# Patient Record
Sex: Female | Born: 1950 | Race: White | Hispanic: No | Smoking: Former smoker
Health system: Southern US, Community
[De-identification: ages and names within clinical notes are randomized; demographics above are authoritative.]

## PROBLEM LIST (undated history)

## (undated) DIAGNOSIS — F32A Depression, unspecified: Secondary | ICD-10-CM

## (undated) DIAGNOSIS — J40 Bronchitis, not specified as acute or chronic: Secondary | ICD-10-CM

## (undated) DIAGNOSIS — I499 Cardiac arrhythmia, unspecified: Secondary | ICD-10-CM

## (undated) DIAGNOSIS — R12 Heartburn: Secondary | ICD-10-CM

## (undated) DIAGNOSIS — T7840XA Allergy, unspecified, initial encounter: Secondary | ICD-10-CM

## (undated) DIAGNOSIS — F419 Anxiety disorder, unspecified: Secondary | ICD-10-CM

## (undated) DIAGNOSIS — F329 Major depressive disorder, single episode, unspecified: Secondary | ICD-10-CM

## (undated) DIAGNOSIS — I10 Essential (primary) hypertension: Secondary | ICD-10-CM

## (undated) DIAGNOSIS — B009 Herpesviral infection, unspecified: Secondary | ICD-10-CM

## (undated) DIAGNOSIS — R569 Unspecified convulsions: Secondary | ICD-10-CM

## (undated) DIAGNOSIS — K219 Gastro-esophageal reflux disease without esophagitis: Secondary | ICD-10-CM

## (undated) DIAGNOSIS — E041 Nontoxic single thyroid nodule: Secondary | ICD-10-CM

## (undated) DIAGNOSIS — Z8489 Family history of other specified conditions: Secondary | ICD-10-CM

## (undated) DIAGNOSIS — N390 Urinary tract infection, site not specified: Secondary | ICD-10-CM

## (undated) HISTORY — DX: Depression, unspecified: F32.A

## (undated) HISTORY — DX: Cardiac arrhythmia, unspecified: I49.9

## (undated) HISTORY — DX: Heartburn: R12

## (undated) HISTORY — PX: KNEE ARTHROSCOPY: SUR90

## (undated) HISTORY — DX: Major depressive disorder, single episode, unspecified: F32.9

## (undated) HISTORY — DX: Gastro-esophageal reflux disease without esophagitis: K21.9

## (undated) HISTORY — PX: ELBOW SURGERY: SHX618

## (undated) HISTORY — DX: Nontoxic single thyroid nodule: E04.1

## (undated) HISTORY — DX: Anxiety disorder, unspecified: F41.9

## (undated) HISTORY — DX: Unspecified convulsions: R56.9

## (undated) HISTORY — PX: COLONOSCOPY: SHX174

## (undated) HISTORY — PX: PILONIDAL CYST EXCISION: SHX744

## (undated) HISTORY — PX: OTHER SURGICAL HISTORY: SHX169

## (undated) HISTORY — DX: Allergy, unspecified, initial encounter: T78.40XA

---

## 1998-05-06 ENCOUNTER — Other Ambulatory Visit: Admission: RE | Admit: 1998-05-06 | Discharge: 1998-05-06 | Payer: Self-pay | Admitting: Obstetrics and Gynecology

## 1999-09-01 ENCOUNTER — Other Ambulatory Visit: Admission: RE | Admit: 1999-09-01 | Discharge: 1999-09-01 | Payer: Self-pay | Admitting: Obstetrics and Gynecology

## 2000-09-02 ENCOUNTER — Other Ambulatory Visit: Admission: RE | Admit: 2000-09-02 | Discharge: 2000-09-02 | Payer: Self-pay | Admitting: *Deleted

## 2001-11-17 ENCOUNTER — Other Ambulatory Visit: Admission: RE | Admit: 2001-11-17 | Discharge: 2001-11-17 | Payer: Self-pay | Admitting: Family Medicine

## 2001-11-21 ENCOUNTER — Encounter: Admission: RE | Admit: 2001-11-21 | Discharge: 2001-11-21 | Payer: Self-pay | Admitting: Family Medicine

## 2001-11-21 ENCOUNTER — Encounter: Payer: Self-pay | Admitting: Family Medicine

## 2002-12-21 ENCOUNTER — Encounter: Admission: RE | Admit: 2002-12-21 | Discharge: 2002-12-21 | Payer: Self-pay | Admitting: Family Medicine

## 2002-12-21 ENCOUNTER — Encounter: Payer: Self-pay | Admitting: Family Medicine

## 2003-01-30 ENCOUNTER — Other Ambulatory Visit: Admission: RE | Admit: 2003-01-30 | Discharge: 2003-01-30 | Payer: Self-pay | Admitting: Family Medicine

## 2004-01-14 ENCOUNTER — Encounter: Admission: RE | Admit: 2004-01-14 | Discharge: 2004-01-14 | Payer: Self-pay | Admitting: Family Medicine

## 2004-02-06 ENCOUNTER — Other Ambulatory Visit: Admission: RE | Admit: 2004-02-06 | Discharge: 2004-02-06 | Payer: Self-pay | Admitting: Family Medicine

## 2004-02-11 ENCOUNTER — Encounter: Admission: RE | Admit: 2004-02-11 | Discharge: 2004-02-11 | Payer: Self-pay | Admitting: Family Medicine

## 2004-02-18 ENCOUNTER — Encounter (HOSPITAL_COMMUNITY): Admission: RE | Admit: 2004-02-18 | Discharge: 2004-05-18 | Payer: Self-pay | Admitting: Family Medicine

## 2004-05-28 ENCOUNTER — Ambulatory Visit: Payer: Self-pay | Admitting: Family Medicine

## 2004-06-01 ENCOUNTER — Ambulatory Visit: Payer: Self-pay | Admitting: Family Medicine

## 2005-01-14 ENCOUNTER — Encounter: Admission: RE | Admit: 2005-01-14 | Discharge: 2005-01-14 | Payer: Self-pay | Admitting: Family Medicine

## 2005-02-10 ENCOUNTER — Ambulatory Visit: Payer: Self-pay | Admitting: Family Medicine

## 2005-02-10 ENCOUNTER — Other Ambulatory Visit: Admission: RE | Admit: 2005-02-10 | Discharge: 2005-02-10 | Payer: Self-pay | Admitting: Family Medicine

## 2005-02-16 ENCOUNTER — Encounter (INDEPENDENT_AMBULATORY_CARE_PROVIDER_SITE_OTHER): Payer: Self-pay | Admitting: *Deleted

## 2005-02-16 ENCOUNTER — Encounter: Admission: RE | Admit: 2005-02-16 | Discharge: 2005-02-16 | Payer: Self-pay | Admitting: Family Medicine

## 2005-06-17 ENCOUNTER — Ambulatory Visit: Payer: Self-pay | Admitting: Family Medicine

## 2005-08-04 ENCOUNTER — Ambulatory Visit: Payer: Self-pay | Admitting: Internal Medicine

## 2005-08-17 ENCOUNTER — Ambulatory Visit: Payer: Self-pay | Admitting: Internal Medicine

## 2005-10-04 ENCOUNTER — Ambulatory Visit: Payer: Self-pay | Admitting: Family Medicine

## 2005-10-20 IMAGING — US US ABDOMEN COMPLETE
1 series · 14 of 25 positions shown · non-contrast
Comparison: None.

CLINICAL DATA: Right upper quadrant pain. 
 COMPLETE ABDOMINAL ULTRASOUND:

[Series 1: unknown · 0.23mm/px · 14 of 67 slices shown]
[im 1/67]
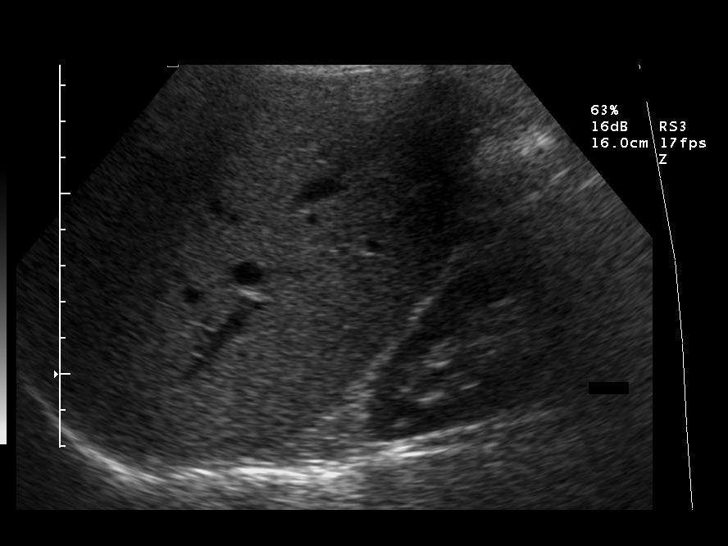
[im 6/67]
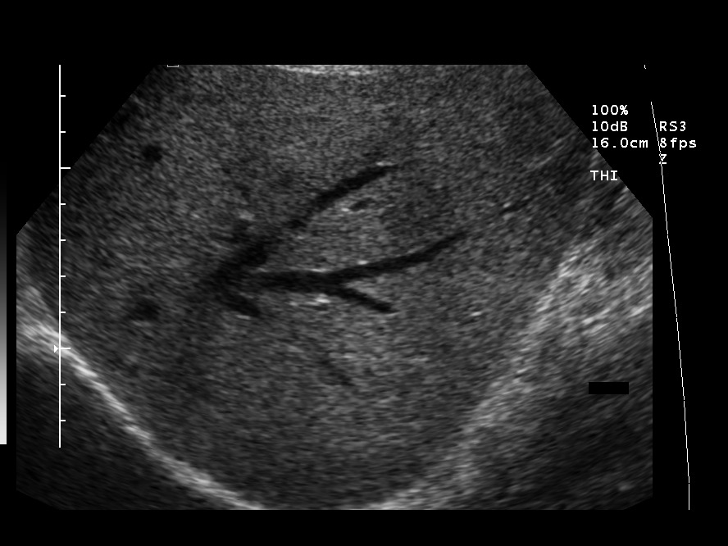
[im 12/67]
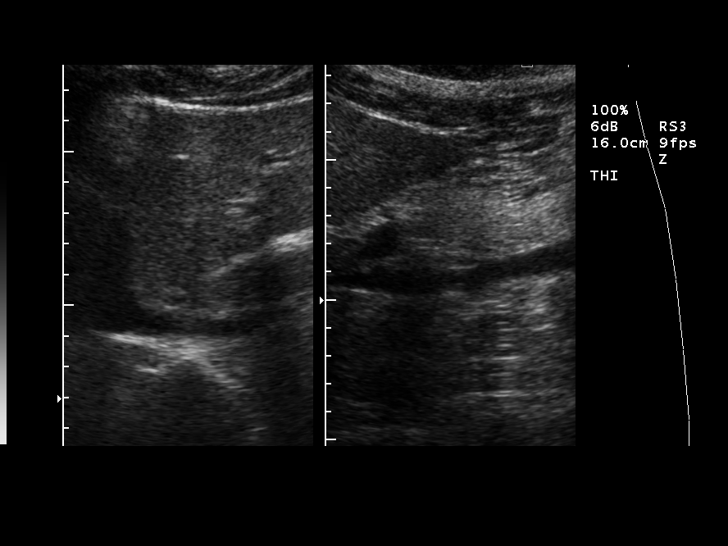
[im 17/67]
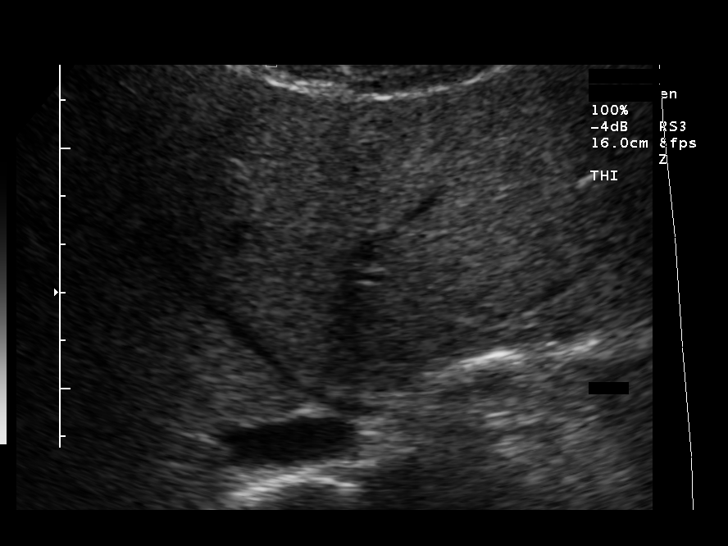
[im 23/67]
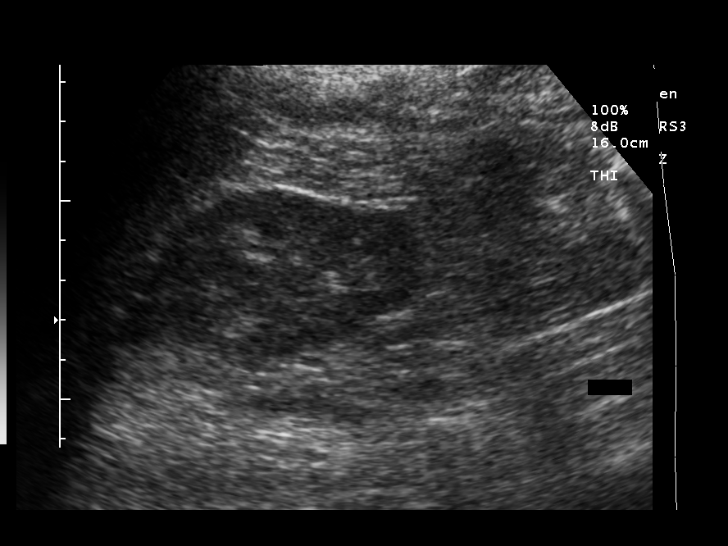
[im 25/67]
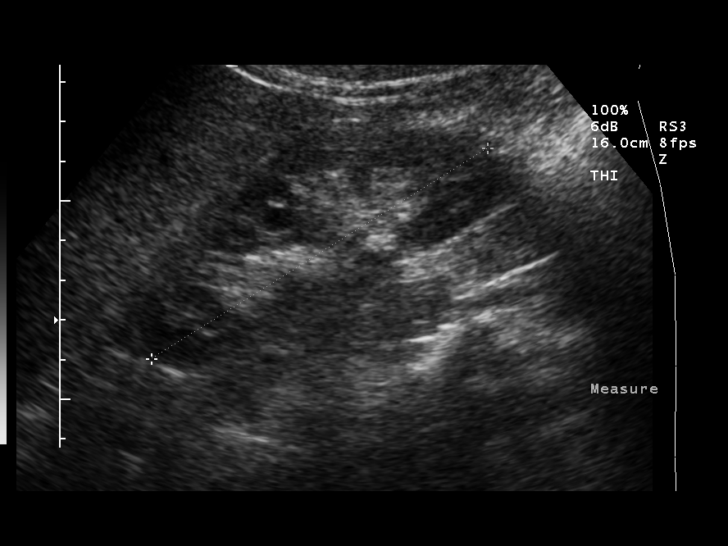
[im 31/67]
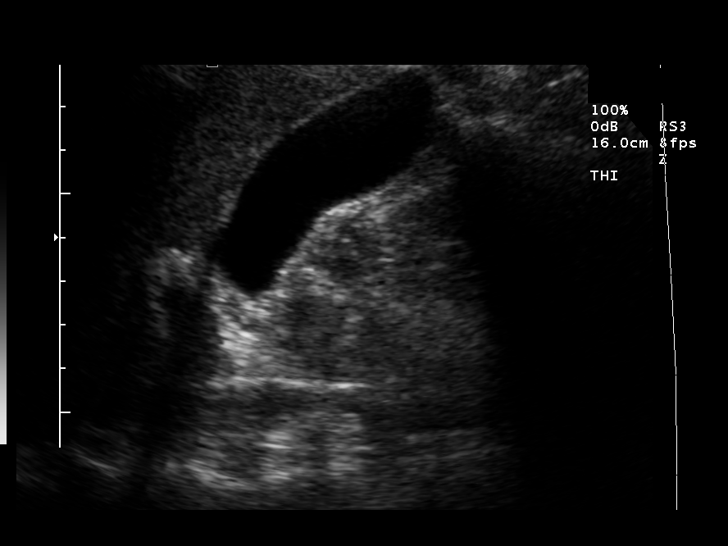
[im 36/67]
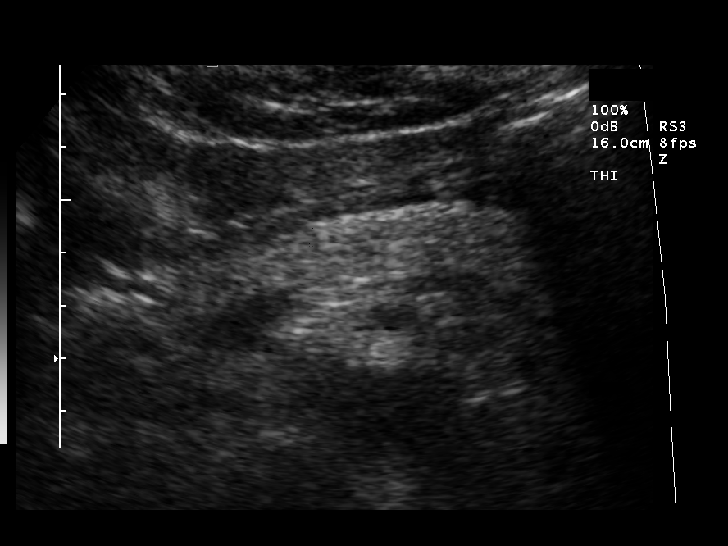
[im 42/67]
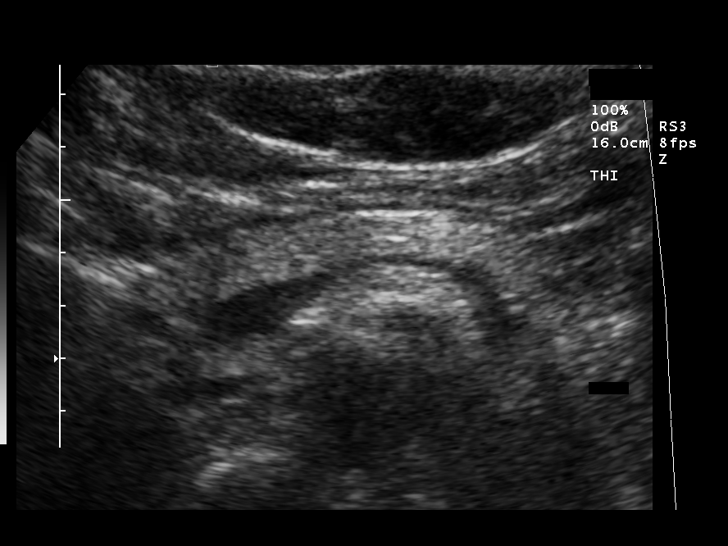
[im 45/67]
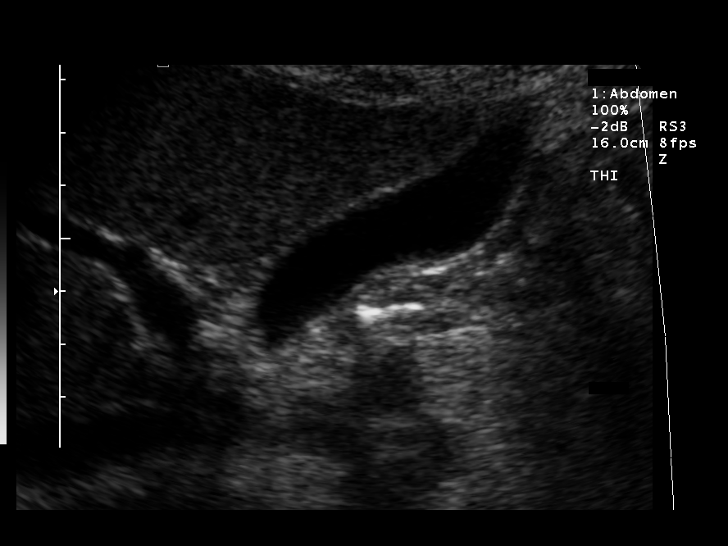
[im 50/67]
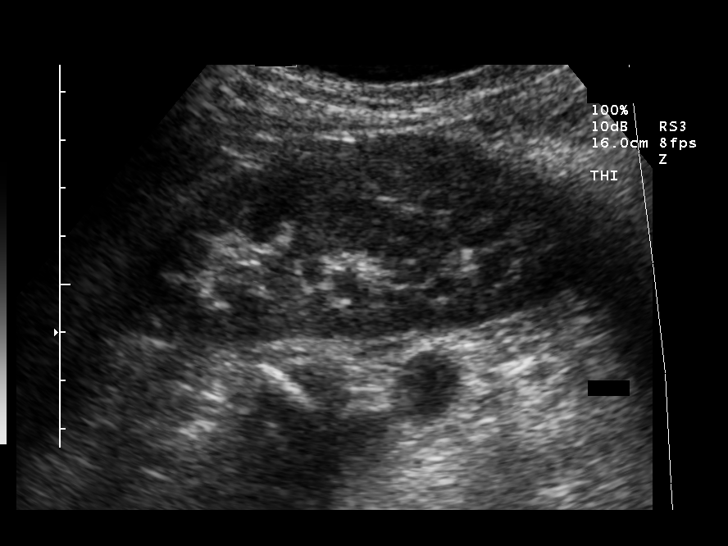
[im 56/67]
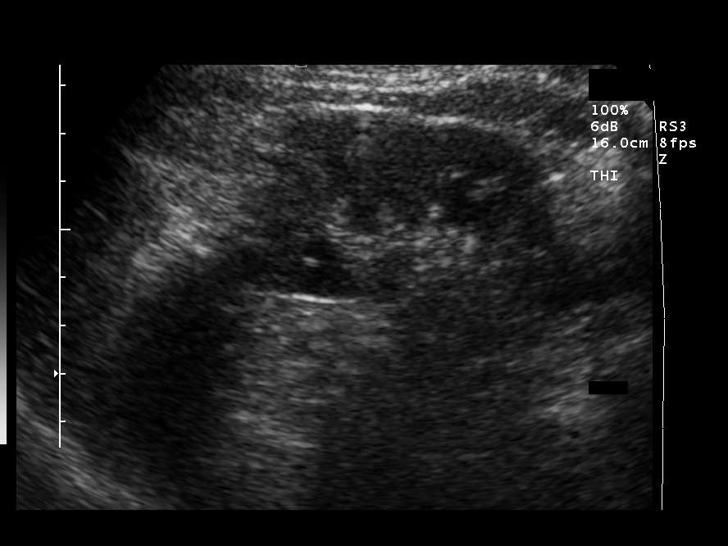
[im 61/67]
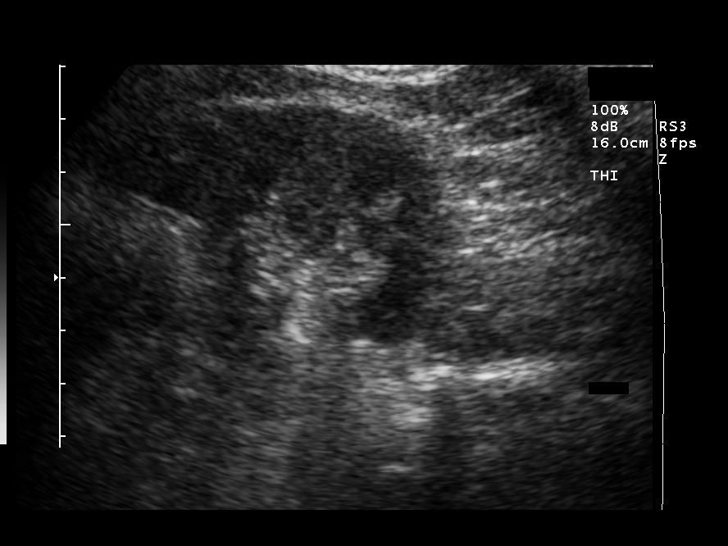
[im 67/67]
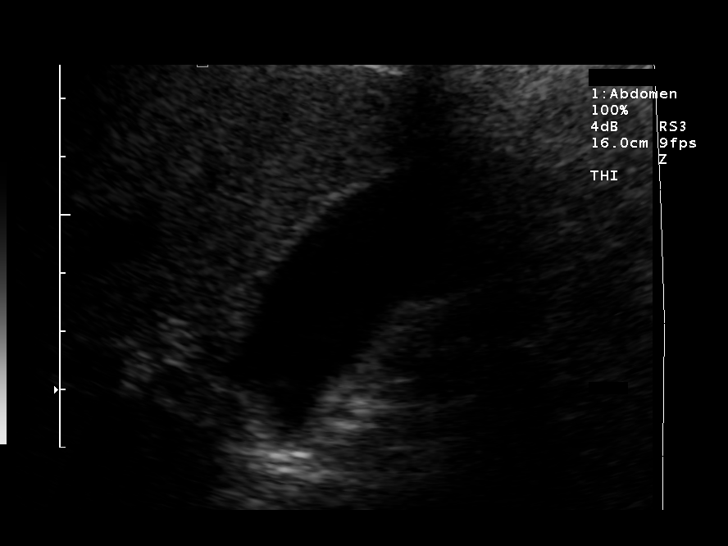

[14 of 25 positions shown; findings below may reference images not displayed]

Gallbladder size and contour normal.  No stones or wall thickening.  Common duct 3 to 4mm.  No focal lesions of the liver or spleen.  Pancreas within normal limits.  Kidney size normal without calculi or obstruction.  Aorta and IVC normal.  No ascites.
IMPRESSION: No acute or significant findings.

## 2006-01-18 ENCOUNTER — Encounter: Admission: RE | Admit: 2006-01-18 | Discharge: 2006-01-18 | Payer: Self-pay | Admitting: Family Medicine

## 2006-02-16 ENCOUNTER — Ambulatory Visit: Payer: Self-pay | Admitting: Family Medicine

## 2006-02-25 ENCOUNTER — Ambulatory Visit: Payer: Self-pay | Admitting: Family Medicine

## 2006-03-17 ENCOUNTER — Encounter: Payer: Self-pay | Admitting: Family Medicine

## 2006-03-17 ENCOUNTER — Ambulatory Visit: Payer: Self-pay | Admitting: Family Medicine

## 2006-03-17 ENCOUNTER — Other Ambulatory Visit: Admission: RE | Admit: 2006-03-17 | Discharge: 2006-03-17 | Payer: Self-pay | Admitting: Family Medicine

## 2006-10-21 ENCOUNTER — Ambulatory Visit: Payer: Self-pay | Admitting: Family Medicine

## 2007-04-27 ENCOUNTER — Telehealth (INDEPENDENT_AMBULATORY_CARE_PROVIDER_SITE_OTHER): Payer: Self-pay | Admitting: *Deleted

## 2007-05-03 ENCOUNTER — Telehealth: Payer: Self-pay | Admitting: Family Medicine

## 2007-10-26 ENCOUNTER — Encounter: Admission: RE | Admit: 2007-10-26 | Discharge: 2007-10-26 | Payer: Self-pay | Admitting: Family Medicine

## 2007-10-30 ENCOUNTER — Telehealth: Payer: Self-pay | Admitting: Family Medicine

## 2007-11-09 ENCOUNTER — Ambulatory Visit: Payer: Self-pay | Admitting: Family Medicine

## 2007-11-09 LAB — CONVERTED CEMR LAB
AST: 27 units/L (ref 0–37)
Albumin: 4.1 g/dL (ref 3.5–5.2)
Alkaline Phosphatase: 45 units/L (ref 39–117)
BUN: 10 mg/dL (ref 6–23)
Bilirubin, Direct: 0.1 mg/dL (ref 0.0–0.3)
Cholesterol: 198 mg/dL (ref 0–200)
Eosinophils Absolute: 0.1 10*3/uL (ref 0.0–0.7)
Eosinophils Relative: 3.5 % (ref 0.0–5.0)
Glucose, Urine, Semiquant: NEGATIVE
HCT: 38.6 % (ref 36.0–46.0)
Hemoglobin: 13.4 g/dL (ref 12.0–15.0)
Ketones, urine, test strip: NEGATIVE
LDL Cholesterol: 121 mg/dL — ABNORMAL HIGH (ref 0–99)
MCHC: 34.8 g/dL (ref 30.0–36.0)
MCV: 95.8 fL (ref 78.0–100.0)
Monocytes Relative: 9.4 % (ref 3.0–12.0)
Neutro Abs: 1.8 10*3/uL (ref 1.4–7.7)
Neutrophils Relative %: 48.7 % (ref 43.0–77.0)
Nitrite: NEGATIVE
Potassium: 4.8 meq/L (ref 3.5–5.1)
RBC: 4.02 M/uL (ref 3.87–5.11)
Sodium: 141 meq/L (ref 135–145)
Total Protein: 7.2 g/dL (ref 6.0–8.3)
VLDL: 31 mg/dL (ref 0–40)

## 2007-12-29 ENCOUNTER — Encounter: Payer: Self-pay | Admitting: Family Medicine

## 2007-12-29 ENCOUNTER — Other Ambulatory Visit: Admission: RE | Admit: 2007-12-29 | Discharge: 2007-12-29 | Payer: Self-pay | Admitting: Family Medicine

## 2007-12-29 ENCOUNTER — Ambulatory Visit: Payer: Self-pay | Admitting: Family Medicine

## 2008-01-01 DIAGNOSIS — R519 Headache, unspecified: Secondary | ICD-10-CM | POA: Insufficient documentation

## 2008-01-01 DIAGNOSIS — K219 Gastro-esophageal reflux disease without esophagitis: Secondary | ICD-10-CM

## 2008-01-01 DIAGNOSIS — R51 Headache: Secondary | ICD-10-CM

## 2008-01-01 DIAGNOSIS — F411 Generalized anxiety disorder: Secondary | ICD-10-CM | POA: Insufficient documentation

## 2008-01-01 DIAGNOSIS — F329 Major depressive disorder, single episode, unspecified: Secondary | ICD-10-CM

## 2008-02-23 ENCOUNTER — Encounter: Payer: Self-pay | Admitting: Family Medicine

## 2008-02-23 ENCOUNTER — Ambulatory Visit: Payer: Self-pay | Admitting: Family Medicine

## 2008-02-23 DIAGNOSIS — L820 Inflamed seborrheic keratosis: Secondary | ICD-10-CM

## 2008-08-06 ENCOUNTER — Telehealth: Payer: Self-pay | Admitting: Family Medicine

## 2008-10-04 ENCOUNTER — Telehealth: Payer: Self-pay | Admitting: Family Medicine

## 2008-11-13 DIAGNOSIS — E060 Acute thyroiditis: Secondary | ICD-10-CM | POA: Insufficient documentation

## 2008-11-14 ENCOUNTER — Ambulatory Visit: Payer: Self-pay | Admitting: Family Medicine

## 2008-11-14 ENCOUNTER — Telehealth: Payer: Self-pay | Admitting: Family Medicine

## 2008-11-14 LAB — CONVERTED CEMR LAB
BUN: 14 mg/dL (ref 6–23)
Basophils Relative: 0.3 % (ref 0.0–3.0)
CO2: 27 meq/L (ref 19–32)
Creatinine, Ser: 0.8 mg/dL (ref 0.4–1.2)
Eosinophils Absolute: 0.1 10*3/uL (ref 0.0–0.7)
Eosinophils Relative: 1.6 % (ref 0.0–5.0)
GFR calc non Af Amer: 78.51 mL/min (ref >60)
Hemoglobin: 12.7 g/dL (ref 12.0–15.0)
Lymphocytes Relative: 22.1 % (ref 12.0–46.0)
Monocytes Absolute: 0.5 10*3/uL (ref 0.1–1.0)
Monocytes Relative: 9.6 % (ref 3.0–12.0)
RDW: 11.7 % (ref 11.5–14.6)
Sodium: 138 meq/L (ref 135–145)
T3, Free: 2.6 pg/mL (ref 2.3–4.2)
WBC: 5.5 10*3/uL (ref 4.5–10.5)

## 2008-11-19 ENCOUNTER — Encounter: Admission: RE | Admit: 2008-11-19 | Discharge: 2008-11-19 | Payer: Self-pay | Admitting: Family Medicine

## 2008-11-21 ENCOUNTER — Ambulatory Visit: Payer: Self-pay | Admitting: Family Medicine

## 2008-12-04 ENCOUNTER — Encounter: Admission: RE | Admit: 2008-12-04 | Discharge: 2008-12-04 | Payer: Self-pay | Admitting: Family Medicine

## 2009-02-05 ENCOUNTER — Encounter: Payer: Self-pay | Admitting: Family Medicine

## 2009-02-05 ENCOUNTER — Ambulatory Visit: Payer: Self-pay | Admitting: Family Medicine

## 2009-02-05 DIAGNOSIS — M542 Cervicalgia: Secondary | ICD-10-CM

## 2009-02-05 DIAGNOSIS — R030 Elevated blood-pressure reading, without diagnosis of hypertension: Secondary | ICD-10-CM

## 2009-04-15 ENCOUNTER — Encounter: Payer: Self-pay | Admitting: Family Medicine

## 2009-04-15 ENCOUNTER — Ambulatory Visit: Payer: Self-pay | Admitting: Family Medicine

## 2009-04-15 ENCOUNTER — Other Ambulatory Visit: Admission: RE | Admit: 2009-04-15 | Discharge: 2009-04-15 | Payer: Self-pay | Admitting: Family Medicine

## 2009-04-15 DIAGNOSIS — H8110 Benign paroxysmal vertigo, unspecified ear: Secondary | ICD-10-CM

## 2009-04-15 DIAGNOSIS — R002 Palpitations: Secondary | ICD-10-CM

## 2009-04-15 DIAGNOSIS — N951 Menopausal and female climacteric states: Secondary | ICD-10-CM

## 2009-04-18 ENCOUNTER — Telehealth: Payer: Self-pay | Admitting: Family Medicine

## 2009-10-09 ENCOUNTER — Ambulatory Visit: Payer: Self-pay | Admitting: Family Medicine

## 2009-10-13 ENCOUNTER — Telehealth: Payer: Self-pay | Admitting: Family Medicine

## 2009-12-17 ENCOUNTER — Encounter: Admission: RE | Admit: 2009-12-17 | Discharge: 2009-12-17 | Payer: Self-pay | Admitting: Family Medicine

## 2010-02-17 ENCOUNTER — Telehealth: Payer: Self-pay | Admitting: Family Medicine

## 2010-03-11 ENCOUNTER — Telehealth: Payer: Self-pay | Admitting: Family Medicine

## 2010-04-17 ENCOUNTER — Ambulatory Visit: Payer: Self-pay | Admitting: Family Medicine

## 2010-04-17 LAB — CONVERTED CEMR LAB
ALT: 43 units/L — ABNORMAL HIGH (ref 0–35)
Alkaline Phosphatase: 64 units/L (ref 39–117)
Bilirubin Urine: NEGATIVE
Calcium: 9.3 mg/dL (ref 8.4–10.5)
Chloride: 104 meq/L (ref 96–112)
Cholesterol: 189 mg/dL (ref 0–200)
HDL: 50.8 mg/dL (ref >39.00)
Lymphs Abs: 1.4 10*3/uL (ref 0.7–4.0)
MCHC: 35.1 g/dL (ref 30.0–36.0)
MCV: 96.3 fL (ref 78.0–100.0)
Neutro Abs: 1.9 10*3/uL (ref 1.4–7.7)
Nitrite: NEGATIVE
Platelets: 180 10*3/uL (ref 150.0–400.0)
RBC: 4.02 M/uL (ref 3.87–5.11)
Sodium: 137 meq/L (ref 135–145)
Specific Gravity, Urine: <=1.005 (ref 1.000–1.030)
Total Bilirubin: 0.6 mg/dL (ref 0.3–1.2)
Total Protein, Urine: NEGATIVE mg/dL
Triglycerides: 206 mg/dL — ABNORMAL HIGH (ref 0.0–149.0)
VLDL: 41.2 mg/dL — ABNORMAL HIGH (ref 0.0–40.0)
pH: 5 (ref 5.0–8.0)

## 2010-04-24 ENCOUNTER — Other Ambulatory Visit
Admission: RE | Admit: 2010-04-24 | Discharge: 2010-04-24 | Payer: Self-pay | Source: Home / Self Care | Admitting: Family Medicine

## 2010-04-24 ENCOUNTER — Ambulatory Visit: Payer: Self-pay | Admitting: Family Medicine

## 2010-04-24 ENCOUNTER — Encounter: Payer: Self-pay | Admitting: Family Medicine

## 2010-04-27 ENCOUNTER — Encounter: Payer: Self-pay | Admitting: Internal Medicine

## 2010-05-29 DIAGNOSIS — R143 Flatulence: Secondary | ICD-10-CM

## 2010-05-29 DIAGNOSIS — R142 Eructation: Secondary | ICD-10-CM

## 2010-05-29 DIAGNOSIS — R110 Nausea: Secondary | ICD-10-CM | POA: Insufficient documentation

## 2010-05-29 DIAGNOSIS — R141 Gas pain: Secondary | ICD-10-CM

## 2010-07-09 ENCOUNTER — Telehealth: Payer: Self-pay | Admitting: Family Medicine

## 2010-08-02 ENCOUNTER — Encounter: Payer: Self-pay | Admitting: Family Medicine

## 2010-08-11 NOTE — Assessment & Plan Note (Signed)
Summary: NAUSEA / DIZZINESS // RS   Vital Signs:  Patient profile:   60 year old female Menstrual status:  postmenopausal Height:      65 inches Weight:      163 pounds BMI:     27.22 Temp:     98.2 degrees F oral BP sitting:   140 / 90  (left arm) Cuff size:   regular  Vitals Entered By: Kern Reap CMA Duncan Dull) (October 09, 2009 11:27 AM) CC: possible depression Is Patient Diabetic? No Pain Assessment Patient in pain? no        CC:  possible depression.  History of Present Illness: Colleen [name] is a 60 year old married female, nonsmoker, who comes in today for evaluation of depression.  She's had a long history of depression.  Her first episode was at age 25.  This is been a recurrent problem.  However, her last episode has been 5 to 6 years ago, maybe 10.  Colleen symptoms started back about 4 months ago.  She thinks they were triggered by Colleen fact that her husband is losing his unemployment, and they have financial issues.  Colleen [name] and her one [name] is single has a 21-year-old but is stable relationship.  Her other [name] Colleen [name]  and she still has some issues.  She states that she is having difficulty sleeping, decreased mood, and focus.  No suicidal ideation  Allergies: 1)  ! Flexeril (Cyclobenzaprine Hcl)  Past History:  Past medical, surgical, family and social histories (including risk factors) reviewed for relevance to current acute and chronic problems.  Past Medical History: Reviewed history from 12/29/2007 and no changes required. Headache Anxiety GERD childbirth x 2 postmenopausal right knee surgery pilonidal cyst Depression  Family History: Reviewed history from 12/29/2007 and no changes required. father died 54, congestive heart failure, and history of hypertension, and seizure disorder mother has hypertension 3 brothers, one in good health.  One has history of seizure disorder.  Colleen other has Ultracet, colitis, cirrhosis, and hypertension.  Five sisters  to have been BP one has fibromyalgia.  Colleen other has atrial fib together in good health  Social History: Reviewed history from 12/29/2007 and no changes required. Occupation: Married Former Smoker Alcohol use-no Drug use-no Regular exercise-no  Review of Systems      See HPI  Physical Exam  General:  Well-developed,well-nourished,in no acute distress; alert,appropriate and cooperative throughout examination Psych:  Cognition and judgment appear intact. Alert and cooperative with normal attention span and concentration. No apparent delusions, illusions, hallucinations   Impression & Recommendations:  Problem # 1:  DEPRESSION (ICD-311) Assessment Deteriorated  Her updated medication list for this problem includes:    Valium 2 Mg Tabs (Diazepam) .Marland Kitchen... 1 by mouth two times a day as needed    Celexa 20 Mg Tabs (Citalopram hydrobromide) .Marland Kitchen... 1 tab @ bedtime    Ativan 0.5 Mg Tabs (Lorazepam) .Marland Kitchen... 1 tab @ bedtime  Complete Medication List: 1)  Valium 2 Mg Tabs (Diazepam) .Marland Kitchen.. 1 by mouth two times a day as needed 2)  Acyclovir 200 Mg Caps (Acyclovir) .... 3 in am, 2 in pm  prn 3)  [facility]vs Aspirin Ec 81 Mg Tbec (Aspirin) .... One by mouth q day 4)  Premarin 0.625 Mg/gm Crea (Estrogens, conjugated) .... Uad 5)  Corgard 40 Mg Tabs (Nadolol) .... Take 1 tablet by mouth every morning 6)  Celexa 20 Mg Tabs (Citalopram hydrobromide) .Marland Kitchen.. 1 tab @ bedtime 7)  Ativan 0.5 Mg Tabs (Lorazepam) .Marland Kitchen.. 1 tab @  bedtime  Other Orders: Prescription Created Electronically [id number])  Patient Instructions: 1)  begin Celexa 20 mg nightly.  If after 3 weeks, you feel well.  Continue that dose.  If not increase it to 40 mg nightly. 2)  You may also take .5 of Ativan nightly for a week or two to help with Colleen sleep dysfunction.  Return p.r.n./or for your annual examination Prescriptions: ATIVAN 0.5 MG TABS (LORAZEPAM) 1 tab @ bedtime  #30 x 2   Entered and Authorized by:   Roderick Pee MD   Signed by:    Roderick Pee MD on 10/09/2009   Method used:   Print then Give to Patient   RxID:   [id number] CELEXA 20 MG TABS (CITALOPRAM HYDROBROMIDE) 1 tab @ bedtime  #100 x 3   Entered and Authorized by:   Roderick Pee MD   Signed by:   Roderick Pee MD on 10/09/2009   Method used:   Electronically to        [facility] (retail)       [address]       [place], [place]  [address]       Ph: [phone] or [phone]       Fax: [phone]   RxID:   [id number] CELEXA 20 MG TABS (CITALOPRAM HYDROBROMIDE) 1 tab @ bedtime  #100 x 3   Entered and Authorized by:   Roderick Pee MD   Signed by:   Roderick Pee MD on 10/09/2009   Method used:   Electronically to        [facility]* (retail)       [address]       [place], [place]  [address]       Ph: [phone]       Fax: [phone]   RxID:   [id number]

## 2010-08-11 NOTE — Progress Notes (Signed)
Summary: refills  Phone Note Call from Patient Call back at Home Phone [phone]   Caller: Patient Call For: Roderick Pee MD Reason for Call: Refill Medication Summary of Call: patient is calling for a 90 day refill of nadolol. she would also like to go back on Diazepam 2mg  if possible also.  she will come pick these up. Initial call taken by: Kern Reap CMA Duncan Dull),  February 17, 2010 2:07 PM  Follow-up for Phone Call        okay to refill medications until next complete physical Follow-up by: Roderick Pee MD,  February 17, 2010 2:11 PM    New/Updated Medications: DIAZEPAM 2 MG TABS (DIAZEPAM) take one tab by mouth two times a day Prescriptions: DIAZEPAM 2 MG TABS (DIAZEPAM) take one tab by mouth two times a day  #200 x 0   Entered by:   Kern Reap CMA (AAMA)   Authorized by:   Roderick Pee MD   Signed by:   Kern Reap CMA (AAMA) on 02/17/2010   Method used:   Print then Give to Patient   RxID:   [id number] CORGARD 40 MG TABS (NADOLOL) Take 1 tablet by mouth every morning  #100 x 0   Entered by:   Kern Reap CMA (AAMA)   Authorized by:   Roderick Pee MD   Signed by:   Kern Reap CMA (AAMA) on 02/17/2010   Method used:   Print then Give to Patient   RxID:   [id number]

## 2010-08-11 NOTE — Progress Notes (Signed)
Summary: refill  Phone Note Refill Request Message from:  Fax from Pharmacy on October 13, 2009 4:10 PM  Refills Requested: Medication #1:  CELEXA 20 MG TABS 1 tab @ bedtime  Method Requested: Electronic Initial call taken by: Kern Reap CMA (AAMA),  October 13, 2009 4:10 PM    Prescriptions: CELEXA 20 MG TABS (CITALOPRAM HYDROBROMIDE) 1 tab @ bedtime  #100 x 3   Entered by:   Kern Reap CMA (AAMA)   Authorized by:   Roderick Pee MD   Signed by:   Kern Reap CMA (AAMA) on 10/13/2009   Method used:   Faxed to ...       [facility] (retail)       [address]       [place], [place]  [address]       Ph: [phone]       Fax: [phone]   RxID:   [id number]

## 2010-08-11 NOTE — Letter (Signed)
Summary: New Patient letter  [facility]  [address] [place], [place] [address]   Phone: [phone]  Fax: [phone]       04/27/2010 MRN: [id number]  Colleen [name] [address] [place], [place]  [address]  Dear Ms. Carvin-MEZGAR,  Welcome to the Gastroenterology Division at [facility].    You are scheduled to see Dr.  Juanda Chance on 06-09-10 at 2:45pm on the 3rd floor at [facility], [address]  We ask that you try to arrive at our office 15 minutes prior to your appointment time to allow for check-in.  We would like you to complete the enclosed self-administered evaluation form prior to your visit and bring it with you on the day of your appointment.  We will review it with you.  Also, please bring a complete list of all your medications or, if you prefer, bring the medication bottles and we will list them.  Please bring your insurance card so that we may make a copy of it.  If your insurance requires a referral to see a specialist, please bring your referral form from your primary care physician.  Co-payments are due at the time of your visit and may be paid by cash, check or credit card.     Your office visit will consist of a consult with your physician (includes a physical exam), any laboratory testing he/she may order, scheduling of any necessary diagnostic testing (e.g. x-ray, ultrasound, CT-scan), and scheduling of a procedure (e.g. Endoscopy, Colonoscopy) if required.  Please allow enough time on your schedule to allow for any/all of these possibilities.    If you cannot keep your appointment, please call [phone] to cancel or reschedule prior to your appointment date.  This allows [name] the opportunity to schedule an appointment for another patient in need of care.  If you do not cancel or reschedule by 5 p.m. the business day prior to your appointment date, you will be charged a $50.00 late cancellation/no-show fee.    Thank you for  choosing [facility] for your medical needs.  We appreciate the opportunity to care for you.  Please visit [name] at our website  to learn more about our practice.                     Sincerely,                                                             The Gastroenterology Division

## 2010-08-11 NOTE — Assessment & Plan Note (Signed)
Summary: cpx/pap/njr   Vital Signs:  Patient profile:   60 year old female Menstrual status:  postmenopausal Height:      65 inches Weight:      159 pounds Temp:     98 degrees F oral Pulse rate:   78 / minute Pulse rhythm:   regular Resp:     12 per minute BP sitting:   144 / 80  Vitals Entered By: Lynann Beaver CMA (April 24, 2010 3:49 PM) CC: Pap/cpx Pain Assessment Patient in pain? no        CC:  Pap/cpx.  History of Present Illness: Colleen [name] is a 60 year old unmarried female, nonsmoker.........Marland Kitchen brother-in-law Gerlene Burdock recently diagnosed by me to have pancreatic cancer........ who comes in today for general physical examination  She takes 2 mg of Valium b.i.d. p.r.n. for anxiety, Premarin cream twice weekly, Corgard 20 mg daily prophylaxis for migraine headaches and competitions Ativan .5 nightly p.r.n. for sleep, and acyclovir p.r.n. 81 mg, baby aspirin.  She gets routine eye care, dental care, BSE monthly, annual mammography, tetanus, 2010, seasonal flu 2011,  For the past 4 months.  She says sensation of abdominal bloating and nausea.  The bloody and nausea gone away, but she's noticed a change in her bowel habits.  Initially, should have a normal bowel movement and then followed by two loose bowel movements.  She went off caffeine to see if that made a difference, but it didn't.  She said no fever, chills, weight loss, blood in her bowel movements, etc. colonoscopy 6 years ago, normal  Current Medications (verified): 1)  Valium 2 Mg  Tabs (Diazepam) .Marland Kitchen.. 1 By Mouth Two Times A Day As Needed 2)  Acyclovir 200 Mg  Caps (Acyclovir) .... 3 in Am, 2 in Pm  Prn 3)  [facility]vs Aspirin Ec 81 Mg  Tbec (Aspirin) .... One By Mouth Q Day 4)  Premarin 0.625 Mg/gm Crea (Estrogens, Conjugated) .... Uad 5)  Corgard 40 Mg Tabs (Nadolol) .... Take 1 Tablet By Mouth Every Morning 6)  Diazepam 2 Mg Tabs (Diazepam) .... Take One Tab By Mouth Two Times A Day  Allergies (verified): 1)  ! Flexeril  (Cyclobenzaprine Hcl)  Past History:  Past medical, surgical, family and social histories (including risk factors) reviewed, and no changes noted (except as noted below).  Past Medical History: Reviewed history from 12/29/2007 and no changes required. Headache Anxiety GERD childbirth x 2 postmenopausal right knee surgery pilonidal cyst Depression  Family History: Reviewed history from 12/29/2007 and no changes required. father died 33, congestive heart failure, and history of hypertension, and seizure disorder mother has hypertension 3 brothers, one in good health.  One has history of seizure disorder.  The other has Ultracet, colitis, cirrhosis, and hypertension.  Five sisters to have been BP one has fibromyalgia.  The other has atrial fib together in good health  Social History: Reviewed history from 12/29/2007 and no changes required. Occupation: Married Former Smoker Alcohol use-no Drug use-no Regular exercise-no  Review of Systems      See HPI  Physical Exam  General:  Well-developed,well-nourished,in no acute distress; alert,appropriate and cooperative throughout examination Head:  Normocephalic and atraumatic without obvious abnormalities. No apparent alopecia or balding. Eyes:  No corneal or conjunctival inflammation noted. EOMI. Perrla. Funduscopic exam benign, without hemorrhages, exudates or papilledema. Vision grossly normal. Ears:  External ear exam shows no significant lesions or deformities.  Otoscopic examination reveals clear canals, tympanic membranes are intact bilaterally without bulging, retraction, inflammation or discharge.  Hearing is grossly normal bilaterally. Nose:  External nasal examination shows no deformity or inflammation. Nasal mucosa are pink and moist without lesions or exudates. Mouth:  Oral mucosa and oropharynx without lesions or exudates.  Teeth in good repair. Neck:  No deformities, masses, or tenderness noted. Chest Wall:  No  deformities, masses, or tenderness noted. Breasts:  No mass, nodules, thickening, tenderness, bulging, retraction, inflamation, nipple discharge or skin changes noted.   Lungs:  Normal respiratory effort, chest expands symmetrically. Lungs are clear to auscultation, no crackles or wheezes. Heart:  Normal rate and regular rhythm. S1 and S2 normal without gallop, murmur, click, rub or other extra sounds. Abdomen:  Bowel sounds positive,abdomen soft and non-tender without masses, organomegaly or hernias noted. Rectal:  No external abnormalities noted. Normal sphincter tone. No rectal masses or tenderness. Genitalia:  Pelvic Exam:        External: normal female genitalia without lesions or masses        Vagina: normal without lesions or masses        Cervix: normal without lesions or masses        Adnexa: normal bimanual exam without masses or fullness        Uterus: normal by palpation        Pap smear: performed Msk:  No deformity or scoliosis noted of thoracic or lumbar spine.   Pulses:  R and L carotid,radial,femoral,dorsalis pedis and posterior tibial pulses are full and equal bilaterally Extremities:  No clubbing, cyanosis, edema, or deformity noted with normal full range of motion of all joints.   Neurologic:  No cranial nerve deficits noted. Station and gait are normal. Plantar reflexes are down-going bilaterally. DTRs are symmetrical throughout. Sensory, motor and coordinative functions appear intact. Skin:  Intact without suspicious lesions or rashes Cervical Nodes:  No lymphadenopathy noted Axillary Nodes:  No palpable lymphadenopathy Inguinal Nodes:  No significant adenopathy Psych:  Cognition and judgment appear intact. Alert and cooperative with normal attention span and concentration. No apparent delusions, illusions, hallucinations   Impression & Recommendations:  Problem # 1:  PALPITATIONS (ICD-785.1) Assessment Improved  Her updated medication list for this problem  includes:    Corgard 40 Mg Tabs (Nadolol) .Marland Kitchen... Take 1 tablet by mouth every morning  Orders: EKG w/ Interpretation ([address]) Prescription Created Electronically [id number])  Problem # 2:  ELEVATED BLOOD PRESSURE (ICD-796.2) Assessment: Improved  Her updated medication list for this problem includes:    Corgard 40 Mg Tabs (Nadolol) .Marland Kitchen... Take 1 tablet by mouth every morning  Orders: EKG w/ Interpretation ([address]) Prescription Created Electronically [phone])  Problem # 3:  ANXIETY (ICD-300.00) Assessment: Improved  The following medications were removed from the medication list:    Celexa 20 Mg Tabs (Citalopram hydrobromide) .Marland Kitchen... 1 tab @ bedtime    Ativan 0.5 Mg Tabs (Lorazepam) .Marland Kitchen... 1 tab @ bedtime Her updated medication list for this problem includes:    Valium 2 Mg Tabs (Diazepam) .Marland Kitchen... 1 by mouth two times a day as needed    Diazepam 2 Mg Tabs (Diazepam) .Marland Kitchen... Take one tab by mouth two times a day  Orders: Prescription Created Electronically [phone])  Problem # 4:  ROUTINE GENERAL MEDICAL EXAM@HEALTH  CARE FACL (ICD-V70.0) Assessment: Unchanged  Orders: Prescription Created Electronically [phone])  Complete Medication List: 1)  Valium 2 Mg Tabs (Diazepam) .Marland Kitchen.. 1 by mouth two times a day as needed 2)  Acyclovir 200 Mg Caps (Acyclovir) .... 3 in am, 2 in pm  prn 3)  Cvs Aspirin  Ec 81 Mg Tbec (Aspirin) .... One by mouth q day 4)  Premarin 0.625 Mg/gm Crea (Estrogens, conjugated) .... Uad 5)  Corgard 40 Mg Tabs (Nadolol) .... Take 1 tablet by mouth every morning 6)  Diazepam 2 Mg Tabs (Diazepam) .... Take one tab by mouth two times a day  Other Orders: Admin 1st Vaccine ([address]) Flu Vaccine 56yrs + ([address]) Gastroenterology Referral (GI)  Patient Instructions: 1)  Please schedule a follow-up appointment in 1 year. 2)  It is important that you exercise regularly at least 20 minutes 5 times a week. If you develop chest pain, have severe difficulty breathing, or feel very tired ,  stop exercising immediately and seek medical attention. 3)  Schedule your mammogram. 4)  Take calcium +Vitamin D daily. 5)  Take an Aspirin every day. 6)  I will put in a note to take it daily consult to GI because of the change in her bowel habits Prescriptions: CORGARD 40 MG TABS (NADOLOL) Take 1 tablet by mouth every morning  #100 x 3   Entered and Authorized by:   Roderick Pee MD   Signed by:   Roderick Pee MD on 04/24/2010   Method used:   Electronically to        [facility] ([facility]       [address]       [place], [place]  [address]       Ph: [phone] or [phone]       Fax: [phone]   RxID:   [id number] PREMARIN 0.625 MG/GM CREA (ESTROGENS, CONJUGATED) UAD  #3 tubes x 6   Entered and Authorized by:   Roderick Pee MD   Signed by:   Roderick Pee MD on 04/24/2010   Method used:   Electronically to        [facility] (retail)       [address]       [place], [place]  [address]       Ph: [phone] or [phone]       Fax: [phone]   RxID:   [id number] DIAZEPAM 2 MG TABS (DIAZEPAM) take one tab by mouth two times a day  #200 x 3   Entered and Authorized by:   Roderick Pee MD   Signed by:   Roderick Pee MD on 04/24/2010   Method used:   Print then Give to Patient   RxID:   [id number] VALIUM 2 MG  TABS (DIAZEPAM) 1 by mouth two times a day as needed  #50 x 3   Entered and Authorized by:   Roderick Pee MD   Signed by:   Roderick Pee MD on 04/24/2010   Method used:   Print then Give to Patient   RxID:   [id number]    Orders Added: 1)  Admin 1st Vaccine [[address]] 2)  Flu Vaccine 33yrs + [[address]] 3)  EKG w/ Interpretation [[address]] 4)  Gastroenterology Referral [GI] 5)  Prescription Created Electronically [G8553] 6)  Est. Patient 40-64 years [[address]] Flu Vaccine Consent Questions     Do you have a history of severe allergic reactions to this vaccine? no    Any prior history of allergic reactions to egg and/or  gelatin? no    Do you have a sensitivity to the preservative Thimersol? no    Do you have a past history of Guillan-Barre Syndrome? no    Do you currently have an acute febrile illness? no  Have you ever had a severe reaction to latex? no    Vaccine information given and explained to patient? yes    Are you currently pregnant? no    Lot Number:[id number]   Exp Date:01/09/2011   Site Given  Left Deltoid IM 1)  Admin 1st Vaccine [9[address]71] 2)  Flu Vaccine 54yrs + [[address]]     .lbflu1

## 2010-08-11 NOTE — Procedures (Signed)
Summary: COLON   Colonoscopy  Procedure date:  08/17/2005  Findings:      Location:  [facility].    Procedures Next Due Date:    Colonoscopy: 08/2015 Patient Name: Colleen [name], Colleen [name] MRN:  Procedure Procedures: Colonoscopy CPT: [address].  Personnel: Endoscopist: Dora L. Juanda Chance, MD.  Referred By: Gershon Crane, MD.  Exam Location: Exam performed in Outpatient Clinic. Outpatient  Patient Consent: Procedure, Alternatives, Risks and Benefits discussed, consent obtained, from patient. Consent was obtained by the RN.  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  Average Risk Screening Routine.  History  Current Medications: Patient is not currently taking Coumadin.  Pre-Exam Physical: Performed Aug 17, 2005. Entire physical exam was normal.  Comments: Pt. history reviewed/updated, physical exam performed prior to initiation of sedation? Exam Exam: Extent of exam reached: Cecum, extent intended: Cecum.  The cecum was identified by appendiceal orifice and IC valve. Colon retroflexion performed. Images taken. ASA Classification: I. Tolerance: excellent.  Monitoring: Pulse and BP monitoring, Oximetry used. Supplemental O2 given.  Colon Prep Used Miralax for colon prep. Prep results: good.  Sedation Meds: Patient assessed and found to be appropriate for moderate (conscious) sedation. Fentanyl 100 mcg. given IV. Versed 10 mg. given IV.  Findings - NORMAL EXAM: Cecum.  - NORMAL EXAM: Rectum. Comments: nothing to explain episodic rectal pain.   Assessment Normal examination.  Comments: no polyps, rectal spasm Events  Unplanned Interventions: No intervention was required.  Unplanned Events: There were no complications. Plans Medication Plan: Antispasmodics: Hyoscyamine SL 0.125 mg prn, starting Aug 17, 2005   Patient Education: Patient given standard instructions for: Yearly hemoccult  testing recommended. Patient instructed to get routine colonoscopy every 10 years.  Disposition: After procedure patient sent to recovery. After recovery patient sent home.   This report was created from the original endoscopy report, which was reviewed and signed by the above listed endoscopist.

## 2010-08-11 NOTE — Progress Notes (Signed)
Summary: refill  Phone Note Call from Patient Call back at Home Phone [phone]   Caller: vm Summary of Call: Wants Rx for premarin vaginal cream faxed to [facility].   Initial call taken by: Rudy Jew, RN,  March 11, 2010 1:10 PM    Prescriptions: PREMARIN 0.625 MG/GM CREA (ESTROGENS, CONJUGATED) UAD  #3 tubes x 6   Entered by:   Kern Reap CMA (AAMA)   Authorized by:   Roderick Pee MD   Signed by:   Kern Reap CMA (AAMA) on 03/12/2010   Method used:   Faxed to ...       [facility] (mail-order)             , [place]         Ph: [phone]       Fax: [phone]   RxID:   [id number]

## 2010-08-13 NOTE — Progress Notes (Signed)
Summary: refill request  Phone Note Refill Request Message from:  Fax from Pharmacy on July 09, 2010 3:25 PM  Refills Requested: Medication #1:  CORGARD 40 MG TABS Take 1 tablet by mouth every morning Initial call taken by: Kern Reap CMA Duncan Dull),  July 09, 2010 3:25 PM    Prescriptions: CORGARD 40 MG TABS (NADOLOL) Take 1 tablet by mouth every morning  #100 x 3   Entered by:   Kern Reap CMA (AAMA)   Authorized by:   Roderick Pee MD   Signed by:   Kern Reap CMA (AAMA) on 07/09/2010   Method used:   Faxed to ...       [facility] (mail-order)             , [place]         Ph: [phone]       Fax: [phone]   RxID:   [id number]

## 2010-10-05 ENCOUNTER — Encounter: Payer: Self-pay | Admitting: Family Medicine

## 2010-10-05 ENCOUNTER — Ambulatory Visit (INDEPENDENT_AMBULATORY_CARE_PROVIDER_SITE_OTHER): Payer: Managed Care, Other (non HMO) | Admitting: Family Medicine

## 2010-10-05 VITALS — BP 140/80 | Temp 98.5°F | Ht 64.5 in | Wt 160.0 lb

## 2010-10-05 DIAGNOSIS — IMO0001 Reserved for inherently not codable concepts without codable children: Secondary | ICD-10-CM

## 2010-10-05 DIAGNOSIS — J45901 Unspecified asthma with (acute) exacerbation: Secondary | ICD-10-CM

## 2010-10-05 MED ORDER — PREDNISONE 20 MG PO TABS: ORAL_TABLET | ORAL | Status: DC

## 2010-10-05 MED ORDER — HYDROCODONE-HOMATROPINE 5-1.5 MG/5ML PO SYRP: 5.0000 mL | ORAL_SOLUTION | Freq: Four times a day (QID) | ORAL | Status: AC | PRN

## 2010-10-05 NOTE — Progress Notes (Signed)
  Subjective:    Patient ID: Colleen [name], female    DOB: 1950-07-15, 60 y.o.   MRN: [id number]  HPI Lorri Is a 60 year old, married female, nonsmoker, who comes in today with a 4-day history of wheezing.  She, states she's never had allergy problems in her life.  Review of systems otherwise negative.  She has been able to sleep at night.  She's had no fever   Review of Systems    General an pulmonary via systems otherwise negative Objective:   Physical Exam    Well-developed well-nourished, female, no acute distress.  HEENT negative.  Neck was supple.  No adenopathy.  Lungs show symmetrical.  Breath sounds bilateral expiratory wheezing    Assessment & Plan:  Asthma begin prednisone burst and taper return Thursday for follow-up

## 2010-10-05 NOTE — Patient Instructions (Signed)
Prednisone......... 3 tablets now then starting tomorrow morning, two tabs every morning x 3 days, one x 3 days, a half x 3 days, then half a tablet every other day for two week taper...............Marland Kitchen return this coming Thursday for follow-up, sooner if any problems

## 2010-10-08 ENCOUNTER — Ambulatory Visit: Payer: Managed Care, Other (non HMO) | Admitting: Family Medicine

## 2010-11-23 ENCOUNTER — Other Ambulatory Visit: Payer: Self-pay | Admitting: Family Medicine

## 2010-11-23 MED ORDER — ACYCLOVIR 200 MG PO CAPS: 200.0000 mg | ORAL_CAPSULE | Freq: Three times a day (TID) | ORAL | Status: DC

## 2010-11-23 NOTE — Telephone Encounter (Signed)
Pt would like a referral to GI other Dr. Juanda Chance.  Who would Dr. Tawanna Cooler recommend?

## 2010-11-23 NOTE — Telephone Encounter (Signed)
rx sent and message left on machine

## 2010-11-23 NOTE — Telephone Encounter (Signed)
Dr. Leone Payor

## 2010-11-24 ENCOUNTER — Telehealth: Payer: Self-pay | Admitting: *Deleted

## 2010-11-24 DIAGNOSIS — K219 Gastro-esophageal reflux disease without esophagitis: Secondary | ICD-10-CM

## 2010-11-24 DIAGNOSIS — R143 Flatulence: Secondary | ICD-10-CM

## 2010-11-24 DIAGNOSIS — R141 Gas pain: Secondary | ICD-10-CM

## 2010-11-24 NOTE — Telephone Encounter (Signed)
patient  Would like to be seen by GI

## 2010-12-02 ENCOUNTER — Telehealth: Payer: Self-pay | Admitting: *Deleted

## 2010-12-02 NOTE — Telephone Encounter (Signed)
Pt continues with bloating and nausea.  Left message for her to call for appt.

## 2010-12-08 NOTE — Telephone Encounter (Signed)
Pt has appt

## 2010-12-10 ENCOUNTER — Ambulatory Visit (INDEPENDENT_AMBULATORY_CARE_PROVIDER_SITE_OTHER): Payer: Managed Care, Other (non HMO) | Admitting: Family Medicine

## 2010-12-10 ENCOUNTER — Telehealth: Payer: Self-pay | Admitting: Internal Medicine

## 2010-12-10 ENCOUNTER — Other Ambulatory Visit: Payer: Self-pay | Admitting: Family Medicine

## 2010-12-10 ENCOUNTER — Encounter: Payer: Self-pay | Admitting: Family Medicine

## 2010-12-10 DIAGNOSIS — N6019 Diffuse cystic mastopathy of unspecified breast: Secondary | ICD-10-CM | POA: Insufficient documentation

## 2010-12-10 DIAGNOSIS — R143 Flatulence: Secondary | ICD-10-CM

## 2010-12-10 DIAGNOSIS — IMO0001 Reserved for inherently not codable concepts without codable children: Secondary | ICD-10-CM

## 2010-12-10 DIAGNOSIS — N6459 Other signs and symptoms in breast: Secondary | ICD-10-CM

## 2010-12-10 DIAGNOSIS — R141 Gas pain: Secondary | ICD-10-CM

## 2010-12-10 DIAGNOSIS — Z1231 Encounter for screening mammogram for malignant neoplasm of breast: Secondary | ICD-10-CM

## 2010-12-10 DIAGNOSIS — K219 Gastro-esophageal reflux disease without esophagitis: Secondary | ICD-10-CM

## 2010-12-10 DIAGNOSIS — J45901 Unspecified asthma with (acute) exacerbation: Secondary | ICD-10-CM

## 2010-12-10 NOTE — Progress Notes (Signed)
  Subjective:    Patient ID: Colleen [name], female    DOB: [birth year], 60 y.o.   MRN: [id number]  HPI  Colleen [name] Is a delightful, 60 year old, married female, nonsmoker, who comes in today for evaluation of wheezing, and reflux.  She has a history of reflux esophagitis, for, which she's taking over-the-counter Pepcid, however, it doesn't seem to be working.  She also has a sensation of food getting stuck in her esophagus.  She's also noticed since her reflexes were, not she's having trouble with wheezing.  She feels short of breath with exertion.  She is not able to exercise like she normally does.  She's also some left upper anterior chest pain.  It comes and goes.  It feels sharp last few seconds and goes away.  She points to superior portion of her left breast as a source for pain.  Her last mammogram a year ago was normal.  She's also complaining of some bloating.    Review of Systems    General GI cardiopulmonary review of systems otherwise negative.  She has an appointment to see Dr. Dickie La in two weeks Objective:   Physical Exam    Well-developed well-nourished, female, in no acute distress.  HEENT negative.  Neck supple.  No adenopathy.  Lungs were clear.  No wheezing.  Cardiac exam negative.  She has fibrocystic changes throughout both breasts in her left breast.  There is a tender 6 mm x 6 mm.  A soft, movable lesion at the 11 o'clock position about two to 3 inches from the nipple.  Nipple is retracted.  The test, normal for her    Assessment & Plan:  Reflux esophagitis, unresponsive to conservative therapy, now with symptoms of dysphasia,,,,,,,,, GI consult ASAP.  She might be having a stricture.  Asthma secondary to reflux.  Fibrocystic breast changes,,,,,,,,, mammogram ASAP.  DC caffeine

## 2010-12-10 NOTE — Telephone Encounter (Signed)
Spoke with patient and offered to move her to an extenders schedule for 12/14/10. Patient states Dr. Tawanna Cooler is starting her on Prilosec BID today and she wants to wait until she tries this and if it does not help she will call back on Monday to move her appointment. She does not want to see extender at this time.

## 2010-12-10 NOTE — Patient Instructions (Signed)
Go on a complete caffeine free diet,, no alcohol, no peppermint,  Begin OTC Prilosec 20 mg twice daily.  Elevate the head of your bad.  Nothing he did drink for 3 hours prior to bedtime.  Call and leave a message with Dr. Marian Sorrow nurse today requesting to be seen sooner.  Use my name.  Set up your screening mammogram ASAP

## 2010-12-22 ENCOUNTER — Encounter: Payer: Self-pay | Admitting: Internal Medicine

## 2010-12-22 ENCOUNTER — Other Ambulatory Visit (INDEPENDENT_AMBULATORY_CARE_PROVIDER_SITE_OTHER): Payer: Managed Care, Other (non HMO)

## 2010-12-22 ENCOUNTER — Ambulatory Visit (INDEPENDENT_AMBULATORY_CARE_PROVIDER_SITE_OTHER): Payer: Managed Care, Other (non HMO) | Admitting: Internal Medicine

## 2010-12-22 DIAGNOSIS — R945 Abnormal results of liver function studies: Secondary | ICD-10-CM

## 2010-12-22 DIAGNOSIS — K219 Gastro-esophageal reflux disease without esophagitis: Secondary | ICD-10-CM

## 2010-12-22 DIAGNOSIS — K3189 Other diseases of stomach and duodenum: Secondary | ICD-10-CM

## 2010-12-22 DIAGNOSIS — R7989 Other specified abnormal findings of blood chemistry: Secondary | ICD-10-CM

## 2010-12-22 DIAGNOSIS — R1013 Epigastric pain: Secondary | ICD-10-CM

## 2010-12-22 LAB — HEPATIC FUNCTION PANEL
ALT: 37 U/L — ABNORMAL HIGH (ref 0–35)
Bilirubin, Direct: 0 mg/dL (ref 0.0–0.3)
Total Protein: 7.4 g/dL (ref 6.0–8.3)

## 2010-12-22 MED ORDER — ESOMEPRAZOLE MAGNESIUM 40 MG PO CPDR: 40.0000 mg | DELAYED_RELEASE_CAPSULE | Freq: Every day | ORAL | Status: DC

## 2010-12-22 NOTE — Progress Notes (Signed)
Colleen [name] March 31, 1951 MRN [id number]     History of Present Illness:  This is a 60 year old white female with chronic gastroesophageal reflux with exacerbation for the last 3 weeks; she is experiencing food and acid regurgitation, especially at night . She has not lost any weight. Prilosec 20 mg twice a day has improved her symptoms but has not resolved her regurgitation. Her brother has a history of reflux and  esophageal stricture. Her sister had a cholecystectomy for cholelithiasis. She had a normal upper abdominal ultrasound in 2006.  We have done a colonoscopy for neoplastic screening and rectal pain in February 2007. She had a normal exam. She denies dysphagia or odynophagia. The patient does not smoke, drink alcohol or take NSAID's. She has reduced her Valium to 2 mg at bedtime only. She is on 81 mg aspirin daily. She denies excessive stress. She was recently diagnosed with asthma.   Past Medical History  Diagnosis Date  . Anxiety   . GERD (gastroesophageal reflux disease)   . Depression   . Heartburn   . Arrhythmia    Past Surgical History  Procedure Date  . Spinal cystectomy   . Knee arthroscopy     right  . Cesarean section     x 2  . Pilonidal cyst excision   . Elbow surgery     right    reports that she has quit smoking. She has never used smokeless tobacco. She reports that she drinks alcohol. She reports that she does not use illicit drugs. family history includes Atrial fibrillation in her sister; Cervical cancer in her mother; Cirrhosis in her brother; Fibromyalgia in her sister; Heart disease in her father; Heart failure in her father; Hypertension in her brother and father; Irritable bowel syndrome in her brother; Prostate cancer in her brother; and Seizures in her brother and father. Allergies  Allergen Reactions  . Cyclobenzaprine Hcl     REACTION: seizure?        Review of Systems:Positive for acid reflux belching bloating trouble swallowing  nausea or vomiting change in bowel habits with some diarrhea  The remainder of the 10  point ROS is negative except as outlined in H&P   Physical Exam: General appearance  Well developed, in no distress, normal voice with no hoarseness.  Eyes- non icteric. HEENT nontraumatic, normocephalic. Mouth no lesions, tongue papillated, no cheilosis. No thrush . Neck supple without adenopathy, thyroid not enlarged, no carotid bruits, no JVD. Lungs Clear to auscultation bilaterally. Cor normal S1 normal S2, regular rhythm , no murmur,  quiet precordium. Abdomen Soft, nontender abdomen with normal active bowel sounds. Liver edge at about 1 fingerprint below right costal margin. Lower abdomen unremarkable. No ascites.  Rectal:Soft Hemoccult negative stool.  Extremities no pedal edema. Skin no lesions. Neurological alert and oriented x 3. Psychological normal mood and affect.  Assessment and Plan:  Problem #1 increased gastroesophageal reflux. There are no specific precipitating factors. She is somewhat responsive to PPIs, but we need to further evaluate her dyspepsia and need to rule out gastric or duodenal outlet obstruction which would lead to increased reflux symptoms. I have given her samples of Nexium 40 mg daily in place of Prilosec at least until after the endoscopy next week. We will proceed with an EGD and upper abdominal ultrasound. She had mild abnormalities of liver function tests last fall so we will repeat her liver profile today. We will consider adding Reglan depending on results of the upper endoscopy.  Problem #2  colorectal screening. Patient had a normal colonoscopy in 2007. Her next recall will be due in 2017.   12/22/2010 Lina Sar

## 2010-12-22 NOTE — Patient Instructions (Addendum)
You have been scheduled for an endoscopy. Please follow written instructions given to you at your visit today. You have been scheduled for an abdominal ultrasound on Wed. 12/30/10 @ 9:00 am at [facility]. Please arrive 15 minutes prior to your appointment time for registration. Make certain not to have anything to eat or drink 6 hours prior to your test. Your physician has requested that you go to the basement for the following lab work before leaving today: LFT's CC: Dr Tawanna Cooler

## 2010-12-23 ENCOUNTER — Ambulatory Visit
Admission: RE | Admit: 2010-12-23 | Discharge: 2010-12-23 | Disposition: A | Discharge status: Home / Self Care | Payer: Managed Care, Other (non HMO) | Source: Ambulatory Visit | Attending: Family Medicine | Admitting: Family Medicine

## 2010-12-23 DIAGNOSIS — Z1231 Encounter for screening mammogram for malignant neoplasm of breast: Secondary | ICD-10-CM

## 2010-12-29 ENCOUNTER — Ambulatory Visit (AMBULATORY_SURGERY_CENTER): Payer: Managed Care, Other (non HMO) | Admitting: Internal Medicine

## 2010-12-29 ENCOUNTER — Encounter: Payer: Self-pay | Admitting: Internal Medicine

## 2010-12-29 DIAGNOSIS — K2270 Barrett's esophagus without dysplasia: Secondary | ICD-10-CM

## 2010-12-29 DIAGNOSIS — R1013 Epigastric pain: Secondary | ICD-10-CM

## 2010-12-29 DIAGNOSIS — K3189 Other diseases of stomach and duodenum: Secondary | ICD-10-CM

## 2010-12-29 DIAGNOSIS — K2940 Chronic atrophic gastritis without bleeding: Secondary | ICD-10-CM

## 2010-12-29 DIAGNOSIS — K219 Gastro-esophageal reflux disease without esophagitis: Secondary | ICD-10-CM

## 2010-12-29 MED ORDER — SODIUM CHLORIDE 0.9 % IV SOLN: 500.0000 mL | INTRAVENOUS | Status: DC

## 2010-12-29 NOTE — Progress Notes (Signed)
Pt states, "I still have bloating and I have been on the Nexium for 7 days."  Pt has been taking Nexium at dinnertime.  Pt told to take Nexium 30 minutes before breakfast to see if medicine will give her better results.  Pt states she has 3 more days of Nexium samples and wanted to see if ultrasound shows anything before getting rx filled.  Pt told to call Dr. Regino Schultze office when she runs out of samples, to get ultrasound results, to see if pt wants to continue on Nexium

## 2010-12-29 NOTE — Patient Instructions (Signed)
Please review discharge instructions Await biopsy results Continue Nexium 40 mg daily, await results of the ultrasound

## 2010-12-30 ENCOUNTER — Telehealth: Payer: Self-pay | Admitting: *Deleted

## 2010-12-30 ENCOUNTER — Ambulatory Visit (HOSPITAL_COMMUNITY)
Admission: RE | Admit: 2010-12-30 | Discharge: 2010-12-30 | Disposition: A | Discharge status: Home / Self Care | Payer: Managed Care, Other (non HMO) | Source: Ambulatory Visit | Attending: Internal Medicine | Admitting: Internal Medicine

## 2010-12-30 DIAGNOSIS — R7989 Other specified abnormal findings of blood chemistry: Secondary | ICD-10-CM | POA: Insufficient documentation

## 2010-12-30 DIAGNOSIS — R945 Abnormal results of liver function studies: Secondary | ICD-10-CM

## 2010-12-30 DIAGNOSIS — K3189 Other diseases of stomach and duodenum: Secondary | ICD-10-CM | POA: Insufficient documentation

## 2010-12-30 DIAGNOSIS — Q619 Cystic kidney disease, unspecified: Secondary | ICD-10-CM | POA: Insufficient documentation

## 2010-12-30 DIAGNOSIS — R1013 Epigastric pain: Secondary | ICD-10-CM

## 2010-12-30 NOTE — Telephone Encounter (Signed)
Left message for patient to call me

## 2010-12-30 NOTE — Telephone Encounter (Signed)
Message copied by Daphine Deutscher on Wed Dec 30, 2010  1:52 PM ------      Message from: Hart Carwin      Created: Wed Dec 30, 2010  1:22 PM       Please call pt with normal ultrasound of the gall bladder, fatty liver incidental finding,

## 2010-12-30 NOTE — Telephone Encounter (Signed)

## 2010-12-31 ENCOUNTER — Telehealth: Payer: Self-pay | Admitting: *Deleted

## 2010-12-31 NOTE — Telephone Encounter (Signed)
Patient given results of Ultrasound of gallbladder as per Dr. Juanda Chance. HX chronic gastroesophageal reflux. EGD on 12/29/10- esophagitis  in distal esophagus- biopsy results pending. Patient states she is having bloating and increased nausea since starting the Nexium on 12/22/10.  She also had bloating when taking Prilosec. She states she would like to stop the Nexium and just manage by diet and anti reflux measures. Any other suggestions for her?[facility] of day-Dr. Juanda Chance off)

## 2010-12-31 NOTE — Telephone Encounter (Signed)
Sounds fine. Anymore issues, let Dr. Juanda Chance know

## 2010-12-31 NOTE — Telephone Encounter (Signed)
error 

## 2010-12-31 NOTE — Telephone Encounter (Signed)
Left a message for patient that she can try what she wanted to do and call us for further problems

## 2011-01-05 ENCOUNTER — Encounter: Payer: Self-pay | Admitting: Internal Medicine

## 2011-01-05 NOTE — Telephone Encounter (Signed)
Error

## 2011-01-14 ENCOUNTER — Telehealth: Payer: Self-pay | Admitting: Internal Medicine

## 2011-01-14 NOTE — Telephone Encounter (Signed)
Patient received a letter telling her the biopsy tissue was normal. She was told last week the pathologist was going to talk with Dr. Juanda Chance this week to clarify the results. She wants to know if the results have been clarified. Also, she states she has stopped the Nexium and is still having reflux. States she has started having nausea also. Some days she has nausea all day then she may not have it one day then it is back for several days. This is new for her.  Last EGD on 12/29/10. Please, advise.

## 2011-01-15 MED ORDER — METOCLOPRAMIDE HCL 10 MG PO TABS: ORAL_TABLET | ORAL | Status: DC

## 2011-01-15 NOTE — Telephone Encounter (Signed)
Biopsies have been clarified by Dr Colonel Bald, please see tel. Notes. She ought to continue Nexiem 40mg /day and add reglan 10mg , #30, 1 po qhs at bed time. If any more questions, she will  Need an  OV.

## 2011-01-15 NOTE — Telephone Encounter (Signed)
Patient given Dr. Regino Schultze recommendations. She is hesitant to continue Nexium. Offered to schedule an OV to discuss but she declined at this point. Rx for Reglan sent to pharmacy.

## 2011-06-30 ENCOUNTER — Ambulatory Visit (INDEPENDENT_AMBULATORY_CARE_PROVIDER_SITE_OTHER): Payer: Managed Care, Other (non HMO) | Admitting: Family Medicine

## 2011-06-30 ENCOUNTER — Encounter: Payer: Self-pay | Admitting: Family Medicine

## 2011-06-30 VITALS — BP 120/80 | Temp 99.4°F | Wt 162.0 lb

## 2011-06-30 DIAGNOSIS — J302 Other seasonal allergic rhinitis: Secondary | ICD-10-CM | POA: Insufficient documentation

## 2011-06-30 DIAGNOSIS — J309 Allergic rhinitis, unspecified: Secondary | ICD-10-CM

## 2011-06-30 MED ORDER — PREDNISONE 20 MG PO TABS: ORAL_TABLET | ORAL | Status: DC

## 2011-06-30 NOTE — Progress Notes (Signed)
  Subjective:    Patient ID: Colleen [name], female    DOB: [birth year], 60 y.o.   MRN: [id number]  HPI Colleen [name] a 60 year old, married female, nonsmoker, who comes in today for evaluation of facial pain.  She states she went to bed last night, fell little bit of nasal congestion, but then woke up at 3 o'clock in the morning with severe facial pain.  No symptoms of migraine.  She never had problems with allergies or asthma as a child, but [name] an adult.  She has had some allergic rhinitis and asthma.  Environmental review of systems negative   Review of Systems    General an allergy review of systems otherwise negative Objective:   Physical Exam Well-developed well-nourished, female, in no acute distress.  HEENT pertinent she has bilateral 4+ nasal edema       Assessment & Plan:  Acute allergic reaction.  Plan prednisone burst and taper

## 2011-06-30 NOTE — Patient Instructions (Signed)
Use a combination of afrin nasal spray, and the saline nasal spray at bedtime for the next 5 nights.  Begin  Prednisone two tabs for 3 days with a taper..............Marland Kitchen Return p.r.n.

## 2011-08-18 ENCOUNTER — Other Ambulatory Visit: Payer: Self-pay | Admitting: Family Medicine

## 2011-09-10 ENCOUNTER — Telehealth: Payer: Self-pay | Admitting: *Deleted

## 2011-09-10 NOTE — Telephone Encounter (Signed)
Pt needs Diazepam 2 mg faxed to [facility].

## 2011-09-13 NOTE — Telephone Encounter (Signed)
Okay to refill prescription until next  physical

## 2011-09-14 MED ORDER — DIAZEPAM 2 MG PO TABS: 2.0000 mg | ORAL_TABLET | Freq: Two times a day (BID) | ORAL | Status: AC

## 2011-09-14 NOTE — Telephone Encounter (Signed)
90 day okay but patient will need to schedule a physical for more refills

## 2011-09-14 NOTE — Telephone Encounter (Signed)
rx faxed

## 2011-09-21 ENCOUNTER — Telehealth: Payer: Self-pay | Admitting: Family Medicine

## 2011-09-21 NOTE — Telephone Encounter (Signed)
Pt called and has sch her cpx for 11/16/11 at 3:45 with fasting labs prior to visit. Pt called [facility] to check on status of diazepam (VALIUM) 2 MG tablet and was told that nothing has been recvd. Pls send to [facility] fax # [phone]     Member ID # [id number]

## 2011-09-22 NOTE — Telephone Encounter (Signed)
Rx called into [facility].

## 2011-10-29 ENCOUNTER — Other Ambulatory Visit (INDEPENDENT_AMBULATORY_CARE_PROVIDER_SITE_OTHER): Payer: Managed Care, Other (non HMO)

## 2011-10-29 DIAGNOSIS — Z0000 Encounter for general adult medical examination without abnormal findings: Secondary | ICD-10-CM

## 2011-10-29 LAB — HEPATIC FUNCTION PANEL
ALT: 34 U/L (ref 0–35)
AST: 25 U/L (ref 0–37)
Albumin: 3.8 g/dL (ref 3.5–5.2)

## 2011-10-29 LAB — CBC WITH DIFFERENTIAL/PLATELET
Basophils Absolute: 0 10*3/uL (ref 0.0–0.1)
Eosinophils Relative: 0.9 % (ref 0.0–5.0)
HCT: 36.1 % (ref 36.0–46.0)
Hemoglobin: 12.4 g/dL (ref 12.0–15.0)
Lymphs Abs: 2.4 10*3/uL (ref 0.7–4.0)
MCV: 97.5 fl (ref 78.0–100.0)
Monocytes Absolute: 0.3 10*3/uL (ref 0.1–1.0)
Monocytes Relative: 7.5 % (ref 3.0–12.0)
Neutro Abs: 1.8 10*3/uL (ref 1.4–7.7)
RDW: 12.9 % (ref 11.5–14.6)

## 2011-10-29 LAB — BASIC METABOLIC PANEL
BUN: 13 mg/dL (ref 6–23)
CO2: 25 mEq/L (ref 19–32)
Chloride: 104 mEq/L (ref 96–112)
GFR: 68.72 mL/min (ref >60.00)
Glucose, Bld: 88 mg/dL (ref 70–99)
Potassium: 3.8 mEq/L (ref 3.5–5.1)
Sodium: 137 mEq/L (ref 135–145)

## 2011-10-29 LAB — LDL CHOLESTEROL, DIRECT: Direct LDL: 110 mg/dL

## 2011-10-29 LAB — LIPID PANEL: Cholesterol: 187 mg/dL (ref 0–200)

## 2011-10-29 LAB — POCT URINALYSIS DIPSTICK
Glucose, UA: NEGATIVE
Ketones, UA: NEGATIVE
Spec Grav, UA: 1.01

## 2011-10-29 LAB — TSH: TSH: 3 u[IU]/mL (ref 0.35–5.50)

## 2011-11-16 ENCOUNTER — Ambulatory Visit (INDEPENDENT_AMBULATORY_CARE_PROVIDER_SITE_OTHER): Payer: Managed Care, Other (non HMO) | Admitting: Family Medicine

## 2011-11-16 ENCOUNTER — Encounter: Payer: Self-pay | Admitting: Family Medicine

## 2011-11-16 VITALS — BP 118/78 | Temp 98.1°F | Ht 64.75 in | Wt 161.0 lb

## 2011-11-16 DIAGNOSIS — I493 Ventricular premature depolarization: Secondary | ICD-10-CM

## 2011-11-16 DIAGNOSIS — N6019 Diffuse cystic mastopathy of unspecified breast: Secondary | ICD-10-CM

## 2011-11-16 DIAGNOSIS — F411 Generalized anxiety disorder: Secondary | ICD-10-CM

## 2011-11-16 DIAGNOSIS — I4949 Other premature depolarization: Secondary | ICD-10-CM

## 2011-11-16 DIAGNOSIS — E049 Nontoxic goiter, unspecified: Secondary | ICD-10-CM

## 2011-11-16 DIAGNOSIS — B009 Herpesviral infection, unspecified: Secondary | ICD-10-CM

## 2011-11-16 DIAGNOSIS — K219 Gastro-esophageal reflux disease without esophagitis: Secondary | ICD-10-CM

## 2011-11-16 DIAGNOSIS — A609 Anogenital herpesviral infection, unspecified: Secondary | ICD-10-CM

## 2011-11-16 MED ORDER — ACYCLOVIR 200 MG PO CAPS: 200.0000 mg | ORAL_CAPSULE | Freq: Three times a day (TID) | ORAL | Status: DC

## 2011-11-16 MED ORDER — NADOLOL 40 MG PO TABS: 40.0000 mg | ORAL_TABLET | Freq: Every day | ORAL | Status: DC

## 2011-11-16 MED ORDER — DIAZEPAM 2 MG PO TABS: ORAL_TABLET | ORAL | Status: DC

## 2011-11-16 MED ORDER — LEVOTHYROXINE SODIUM 125 MCG PO TABS: 125.0000 ug | ORAL_TABLET | Freq: Every day | ORAL | Status: DC

## 2011-11-16 NOTE — Patient Instructions (Signed)
Continue the Corgard 40 mg one tablet daily  Take the Valium and acyclovir when necessary  Restart the Synthroid 1 daily followup in 2 months call to get set up for her mammogram

## 2011-11-16 NOTE — Progress Notes (Signed)
  Subjective:    Patient ID: Colleen [name], female    DOB: 11/05/1950, 61 y.o.   MRN: [id number]  HPI [name] is a 61 year old married female nonsmoker who comes in today for general physical examination  She takes 2 mg of Valium when necessary for anxiety  She takes acyclovir when necessary for break of herpes  She takes Corgard 40 mg daily because of a history of PVCs currently asymptomatic  About 10 years ago she was worked up for a goiter. She is placed on suppression stopped it 5 years ago and she's noticed for last couple weeks her thyroid is beginning to enlarge again.  She gets routine eye care, dental care, does not check her breasts monthly but does get an annual mammogram. Colonoscopy normal tetanus 2010   Review of Systems     Objective:   Physical Exam  Constitutional: She appears well-developed and well-nourished.  HENT:  Head: Normocephalic and atraumatic.  Right Ear: External ear normal.  Left Ear: External ear normal.  Nose: Nose normal.  Mouth/Throat: Oropharynx is clear and moist.  Eyes: EOM are normal. Pupils are equal, round, and reactive to light.  Neck: Normal range of motion. Neck supple. No JVD present. No tracheal deviation present. Thyromegaly present.       Diffuse enlargement of both lobes of the thyroid gland no palpable nodules  Cardiovascular: Normal rate, regular rhythm, normal heart sounds and intact distal pulses.  Exam reveals no gallop and no friction rub.   No murmur heard. Pulmonary/Chest: Effort normal and breath sounds normal. No stridor.  Abdominal: Soft. Bowel sounds are normal. She exhibits no distension and no mass. There is no tenderness. There is no rebound.  Genitourinary: Vagina normal and uterus normal. Guaiac negative stool. No vaginal discharge found.  Musculoskeletal: Normal range of motion.  Lymphadenopathy:    She has no cervical adenopathy.  Neurological: She is alert. She has normal reflexes. No cranial nerve deficit.  She exhibits normal muscle tone. Coordination normal.  Skin: Skin is warm and dry.  Psychiatric: She has a normal mood and affect. Her behavior is normal. Judgment and thought content normal.          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

## 2012-01-17 ENCOUNTER — Ambulatory Visit: Payer: Managed Care, Other (non HMO) | Admitting: Family Medicine

## 2012-01-24 ENCOUNTER — Encounter: Payer: Self-pay | Admitting: Family Medicine

## 2012-01-24 ENCOUNTER — Ambulatory Visit (INDEPENDENT_AMBULATORY_CARE_PROVIDER_SITE_OTHER): Payer: Managed Care, Other (non HMO) | Admitting: Family Medicine

## 2012-01-24 VITALS — BP 124/84 | Temp 98.1°F | Wt 157.0 lb

## 2012-01-24 DIAGNOSIS — E049 Nontoxic goiter, unspecified: Secondary | ICD-10-CM

## 2012-01-24 NOTE — Progress Notes (Signed)
  Subjective:    Patient ID: Colleen [name], female    DOB: July 01, 1951, 61 y.o.   MRN: [id number]  HPI And is a 61 year old female comes in today for followup of a goiter  We saw her couple months ago with a new onset of a symmetrical goiter and began Synthroid 125 mcg daily. Her thyroid enlargement has not increased nor decreased.   Review of Systems Gen. review of systems negative no dysphagia    Objective:   Physical Exam Well-developed well nourished female no acute distress examination of thyroid shows diffuse enlargement nodular diffuse no dominant nodule by palpation       Assessment & Plan:  Multinodular goiter unresponsive due to Synthroid suppression plan

## 2012-01-24 NOTE — Patient Instructions (Addendum)
Continue current medications  We will get you set up for a scan of your thyroid

## 2012-02-02 ENCOUNTER — Ambulatory Visit
Admission: RE | Admit: 2012-02-02 | Discharge: 2012-02-02 | Disposition: A | Discharge status: Home / Self Care | Payer: Managed Care, Other (non HMO) | Source: Ambulatory Visit | Attending: Family Medicine | Admitting: Family Medicine

## 2012-02-02 DIAGNOSIS — E049 Nontoxic goiter, unspecified: Secondary | ICD-10-CM

## 2012-02-07 ENCOUNTER — Telehealth: Payer: Self-pay | Admitting: Family Medicine

## 2012-02-07 DIAGNOSIS — E079 Disorder of thyroid, unspecified: Secondary | ICD-10-CM

## 2012-02-07 NOTE — Telephone Encounter (Signed)
Caller: Marne/Patient; PCP: Kelle Darting A.;Call regarding [name] of Thyroid Done On 02/02/12 and Needing Results.; Please call her back at  CB#: [phone] up until 5pm and then at home after 5:20pm  [phone].

## 2012-02-07 NOTE — Telephone Encounter (Signed)
Left message on machine for patient to return our call.  [name] order sent

## 2012-02-08 ENCOUNTER — Other Ambulatory Visit: Payer: Self-pay | Admitting: Family Medicine

## 2012-02-08 DIAGNOSIS — E079 Disorder of thyroid, unspecified: Secondary | ICD-10-CM

## 2012-02-16 ENCOUNTER — Ambulatory Visit
Admission: RE | Admit: 2012-02-16 | Discharge: 2012-02-16 | Disposition: A | Discharge status: Home / Self Care | Payer: Managed Care, Other (non HMO) | Source: Ambulatory Visit | Attending: Family Medicine | Admitting: Family Medicine

## 2012-02-16 ENCOUNTER — Other Ambulatory Visit (HOSPITAL_COMMUNITY)
Admission: RE | Admit: 2012-02-16 | Discharge: 2012-02-16 | Disposition: A | Discharge status: Home / Self Care | Payer: Managed Care, Other (non HMO) | Source: Ambulatory Visit | Attending: Interventional Radiology | Admitting: Interventional Radiology

## 2012-02-16 DIAGNOSIS — E079 Disorder of thyroid, unspecified: Secondary | ICD-10-CM

## 2012-02-16 DIAGNOSIS — E049 Nontoxic goiter, unspecified: Secondary | ICD-10-CM | POA: Insufficient documentation

## 2012-02-22 ENCOUNTER — Telehealth: Payer: Self-pay | Admitting: *Deleted

## 2012-02-22 ENCOUNTER — Telehealth (INDEPENDENT_AMBULATORY_CARE_PROVIDER_SITE_OTHER): Payer: Self-pay | Admitting: General Surgery

## 2012-02-22 NOTE — Telephone Encounter (Signed)
Patient aware of appt

## 2012-02-22 NOTE — Telephone Encounter (Signed)
I called the patient with biopsy results and she has questions and insists that she only speak with Dr Tawanna Cooler

## 2012-02-22 NOTE — Telephone Encounter (Signed)
Appt made with Dr Jamey Ripa 03/17/2012@9 :50am per Dr Tawanna Cooler. Dr Dwain Sarna does not see thyroid evaluations. Per Dr Tawanna Cooler to call patient with appt. LMOM for patient to call back for appt.

## 2012-02-22 NOTE — Telephone Encounter (Signed)
I discussed this situation with the patient today. The pathology looks like it's probably okay however the pathologist is hedging a little bit therefore I think we are to get a second opinion from Dr. Lyn Henri to see if a total excision would be indicated. I called her office and they're going to set that up and call her

## 2012-02-24 ENCOUNTER — Telehealth: Payer: Self-pay | Admitting: Family Medicine

## 2012-02-24 NOTE — Telephone Encounter (Signed)
Pt requesting to have bioposy results mailed to her from august 7th

## 2012-02-24 NOTE — Telephone Encounter (Signed)
Copy mailed to home address 

## 2012-02-25 ENCOUNTER — Telehealth: Payer: Self-pay | Admitting: Family Medicine

## 2012-02-25 NOTE — Telephone Encounter (Signed)
Pt called back in regard to her biopsy. Pt requested to have it mailed to her yesterday. Pt informed it has been mailed. Pt is not requesting to be called about the results. Please contact

## 2012-02-29 ENCOUNTER — Telehealth: Payer: Self-pay | Admitting: Family Medicine

## 2012-02-29 NOTE — Telephone Encounter (Signed)
Caller: [name]/Patient; Patient Name: Colleen [name]; PCP: Roderick Pee.; Best Callback Phone Number: [phone].  Pt states she had 2 biopsies done on thyroid.  Pt states she has not received results for the biopsy done on left lower lobe.  Per EMR it appears that pt has abnormalities from that biopsy.  Please call pt back regarding information on left lower lobe biopsy.

## 2012-02-29 NOTE — Telephone Encounter (Signed)
Dr Tawanna Cooler went over results with patient.  Copy was mailed to home address.  Left message on machine returning patient's call and to offer an office visit.

## 2012-03-03 ENCOUNTER — Telehealth: Payer: Self-pay | Admitting: Family Medicine

## 2012-03-03 NOTE — Telephone Encounter (Signed)
Caller: Colleen [name]/Patient; Phone: [phone]; Reason for Call: Pt spoke w/nurse Tues re results of needle aspiration biopsy; she received results of one but is waiting on second set of results (sent to different location).  Please call her as soon as possible to advise.

## 2012-03-07 NOTE — Telephone Encounter (Signed)
Fleet Contras please research this and find the data the reports

## 2012-03-07 NOTE — Telephone Encounter (Signed)
printed

## 2012-03-17 ENCOUNTER — Ambulatory Visit (INDEPENDENT_AMBULATORY_CARE_PROVIDER_SITE_OTHER): Payer: Managed Care, Other (non HMO) | Admitting: Surgery

## 2012-03-17 ENCOUNTER — Encounter (INDEPENDENT_AMBULATORY_CARE_PROVIDER_SITE_OTHER): Payer: Self-pay | Admitting: Surgery

## 2012-03-17 VITALS — BP 138/80 | HR 77 | Temp 96.5°F | Resp 18 | Ht 64.0 in | Wt 152.8 lb

## 2012-03-17 DIAGNOSIS — E041 Nontoxic single thyroid nodule: Secondary | ICD-10-CM

## 2012-03-17 DIAGNOSIS — R490 Dysphonia: Secondary | ICD-10-CM

## 2012-03-17 HISTORY — DX: Nontoxic single thyroid nodule: E04.1

## 2012-03-17 NOTE — Patient Instructions (Signed)
We will schedule surgery for removal of your thyroid gland after you have been seen by ENT to evaluate your vocal cords since you have some intermittent hoarseness

## 2012-03-17 NOTE — Progress Notes (Signed)
Patient ID: Colleen [name], female   DOB: Nov 24, 1950, 61 y.o.   MRN: [id number]  Chief Complaint  Patient presents with  . Thyroid Nodule    HPI Colleen [name] is a 61 y.o. female.  She comes in for evaluation for possible thyroidectomy. She has had a "goiter" for over 8 years. She's had multiple nodules noted. She was on Synthroid thyroid suppression several years ago but then stopped but restarted a few months ago. Recently she has noted some increase in the size of the nodule in her neck. She has slight compression symptoms when she swallows.she also notes some intermittent hoarseness with a "cracking" in her voice which she tries to sing. This has only been for a few months.she had an ultrasound-guided biopsy. 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 In addition she has nodules in both lobes as well as a large one in the isthmus. Some of these may be new. One of them has some calcifications. HPI  Past Medical History  Diagnosis Date  . Anxiety   . GERD (gastroesophageal reflux disease)   . Depression   . Heartburn   . Arrhythmia   . Asthma     DX IN [facility]  . Seizures     MEDICATION INDUCED    Past Surgical History  Procedure Date  . Spinal cystectomy   . Knee arthroscopy     right  . Cesarean section     x 2  . Pilonidal cyst excision   . Elbow surgery     right  . Colonoscopy     Family History  Problem Relation Age of Onset  . Heart failure Father   . Hypertension Father   . Seizures Father   . Heart disease Father   . Seizures Brother   . Irritable bowel syndrome Brother   . Cirrhosis Brother   . Hypertension Brother     x 4  . Fibromyalgia Sister   . Atrial fibrillation Sister   . Prostate cancer Brother   . Cervical cancer Mother     Social History History  Substance Use Topics  . Smoking status: Former Games developer  . Smokeless tobacco: Never Used  . Alcohol Use: Yes     Allergies  Allergen Reactions  . Cyclobenzaprine Hcl     REACTION: seizure?    Current Outpatient Prescriptions  Medication Sig Dispense Refill  . acyclovir (ZOVIRAX) 200 MG capsule Take 1 capsule (200 mg total) by mouth 3 (three) times daily.  90 capsule  3  . aspirin 81 MG tablet Take 81 mg by mouth daily.        . diazepam (VALIUM) 2 MG tablet Take 2 mg by mouth at bedtime as needed.       . diphenhydrAMINE (BENADRYL) 12.5 MG chewable tablet Chew 12.5 mg by mouth 4 (four) times daily as needed.      Marland Kitchen levothyroxine (SYNTHROID, LEVOTHROID) 125 MCG tablet Take 1 tablet (125 mcg total) by mouth daily.  90 tablet  3  . nadolol (CORGARD) 40 MG tablet Take 1 tablet (40 mg total) by mouth daily.  100 tablet  3  . predniSONE (DELTASONE) 20 MG tablet Two tabs x 3 days, one tab x 3 days, a half a tab x 3 days, then a half a tablet Monday, Wednesday, Friday, for a two-week taper  30 tablet  1   Current Facility-Administered Medications  Medication Dose Route Frequency Provider Last Rate Last  Dose  . 0.9 %  sodium chloride infusion  500 mL Intravenous Continuous Hart Carwin, MD        Review of Systems Review of Systems  Constitutional: Negative for fever, chills and unexpected weight change.  HENT: Positive for trouble swallowing and voice change. Negative for hearing loss, congestion and sore throat.   Eyes: Negative for visual disturbance.  Respiratory: Negative for cough and wheezing.   Cardiovascular: Negative for chest pain, palpitations and leg swelling.  Gastrointestinal: Negative for nausea, vomiting, abdominal pain, diarrhea, constipation, blood in stool, abdominal distention and anal bleeding.  Genitourinary: Negative for hematuria, vaginal bleeding and difficulty urinating.  Musculoskeletal: Negative for arthralgias.  Skin: Negative for rash and wound.  Neurological: Positive for headaches. Negative for seizures and syncope.  Hematological: Negative for adenopathy. Does  not bruise/bleed easily.  Psychiatric/Behavioral: Negative for confusion.    Blood pressure 138/80, pulse 77, temperature 96.5 F (35.8 C), temperature source Temporal, resp. rate 18, height 5\' 4"  (1.626 m), weight 152 lb 12.8 oz (69.31 kg), SpO2 98.00%.  Physical Exam Physical Exam  Vitals reviewed. Constitutional: She is oriented to person, place, and time. She appears well-developed and well-nourished. No distress.  HENT:  Head: Normocephalic and atraumatic.  Mouth/Throat: Oropharynx is clear and moist.  Eyes: Conjunctivae and EOM are normal. Pupils are equal, round, and reactive to light. No scleral icterus.  Neck: Normal range of motion. Neck supple. No tracheal deviation present. Mass and thyromegaly present.         Thyroid nodule, soft, no adenopathy  Cardiovascular: Normal rate, regular rhythm, normal heart sounds and intact distal pulses.  Exam reveals no gallop and no friction rub.   No murmur heard. Pulmonary/Chest: Effort normal and breath sounds normal. No respiratory distress. She has no wheezes. She has no rales.  Abdominal: Soft. Bowel sounds are normal. She exhibits no distension and no mass. There is no tenderness. There is no rebound and no guarding.  Musculoskeletal: Normal range of motion. She exhibits no edema and no tenderness.  Neurological: She is alert and oriented to person, place, and time.  Skin: Skin is warm and dry. No rash noted. She is not diaphoretic. No erythema.  Psychiatric: She has a normal mood and affect. Her behavior is normal. Judgment and thought content normal.    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 If no issues I recommended total  thyroidectomy, Reviewed risks and complications of this in detail       Ary Rudnick J 03/17/2012, 10:32 AM

## 2012-03-24 ENCOUNTER — Encounter (HOSPITAL_COMMUNITY): Payer: Self-pay | Admitting: Pharmacist

## 2012-04-04 ENCOUNTER — Encounter (HOSPITAL_COMMUNITY)
Admission: RE | Admit: 2012-04-04 | Discharge: 2012-04-04 | Disposition: A | Discharge status: Home / Self Care | Payer: Managed Care, Other (non HMO) | Source: Ambulatory Visit | Attending: Surgery | Admitting: Surgery

## 2012-04-04 ENCOUNTER — Encounter (HOSPITAL_COMMUNITY): Payer: Self-pay

## 2012-04-04 HISTORY — DX: Bronchitis, not specified as acute or chronic: J40

## 2012-04-04 HISTORY — DX: Family history of other specified conditions: Z84.89

## 2012-04-04 HISTORY — DX: Urinary tract infection, site not specified: N39.0

## 2012-04-04 HISTORY — DX: Herpesviral infection, unspecified: B00.9

## 2012-04-04 LAB — BASIC METABOLIC PANEL
BUN: 10 mg/dL (ref 6–23)
CO2: 26 mEq/L (ref 19–32)
Calcium: 9.6 mg/dL (ref 8.4–10.5)
Chloride: 103 mEq/L (ref 96–112)
Creatinine, Ser: 0.76 mg/dL (ref 0.50–1.10)

## 2012-04-04 LAB — CBC
HCT: 39.1 % (ref 36.0–46.0)
MCH: 32.1 pg (ref 26.0–34.0)
MCHC: 35.5 g/dL (ref 30.0–36.0)
MCV: 90.3 fL (ref 78.0–100.0)
Platelets: 208 10*3/uL (ref 150–400)
RDW: 12.3 % (ref 11.5–15.5)

## 2012-04-04 LAB — SURGICAL PCR SCREEN: MRSA, PCR: NEGATIVE

## 2012-04-04 MED ORDER — CHLORHEXIDINE GLUCONATE 4 % EX LIQD: 1.0000 "application " | Freq: Once | CUTANEOUS | Status: DC

## 2012-04-04 NOTE — Pre-Procedure Instructions (Signed)
21 Colleen [name]  04/04/2012   Your procedure is scheduled on:  Friday April 07, 2012  Report to [facility] at 11"00AM.  Call this number if you have problems the morning of surgery: [phone]   Remember:   Do not eat food or drink:After Midnight.      Take these medicines the morning of surgery with A SIP OF WATER: synthroid, nadolol   Do not wear jewelry, make-up or nail polish.  Do not wear lotions, powders, or perfumes. You may wear deodorant.  Do not shave 48 hours prior to surgery. Men may shave face and neck.  Do not bring valuables to the hospital.  Contacts, dentures or bridgework may not be worn into surgery.  Leave suitcase in the car. After surgery it may be brought to your room.  For patients admitted to the hospital, checkout time is 11:00 AM the day of discharge.   Patients discharged the day of surgery will not be allowed to drive home.  Name and phone number of your driver: family / friend  Special Instructions: Shower using CHG 2 nights before surgery and the night before surgery.  If you shower the day of surgery use CHG.  Use special wash - you have one bottle of CHG for all showers.  You should use approximately 1/3 of the bottle for each shower.   Please read over the following fact sheets that you were given: Pain Booklet, Coughing and Deep Breathing, MRSA Information and Surgical Site Infection Prevention

## 2012-04-06 MED ORDER — CEFAZOLIN SODIUM-DEXTROSE 2-3 GM-% IV SOLR
2.0000 g | INTRAVENOUS | Status: AC
  Administered 2012-04-07: 2 g via INTRAVENOUS
  Filled 2012-04-06: qty 50

## 2012-04-07 ENCOUNTER — Encounter (HOSPITAL_COMMUNITY): Payer: Self-pay | Admitting: Anesthesiology

## 2012-04-07 ENCOUNTER — Ambulatory Visit (HOSPITAL_COMMUNITY)
Admission: RE | Admit: 2012-04-07 | Discharge: 2012-04-09 | Disposition: A | Discharge status: Home / Self Care | Payer: Managed Care, Other (non HMO) | Source: Ambulatory Visit | Attending: Surgery | Admitting: Surgery

## 2012-04-07 ENCOUNTER — Encounter (HOSPITAL_COMMUNITY)
Admission: RE | Disposition: A | Discharge status: Home / Self Care | Payer: Self-pay | Source: Ambulatory Visit | Attending: Surgery

## 2012-04-07 ENCOUNTER — Ambulatory Visit (HOSPITAL_COMMUNITY): Payer: Managed Care, Other (non HMO) | Admitting: Anesthesiology

## 2012-04-07 ENCOUNTER — Encounter (HOSPITAL_COMMUNITY): Payer: Self-pay | Admitting: Surgery

## 2012-04-07 DIAGNOSIS — Z01812 Encounter for preprocedural laboratory examination: Secondary | ICD-10-CM | POA: Insufficient documentation

## 2012-04-07 DIAGNOSIS — E040 Nontoxic diffuse goiter: Secondary | ICD-10-CM

## 2012-04-07 DIAGNOSIS — E049 Nontoxic goiter, unspecified: Secondary | ICD-10-CM

## 2012-04-07 DIAGNOSIS — I493 Ventricular premature depolarization: Secondary | ICD-10-CM

## 2012-04-07 DIAGNOSIS — E063 Autoimmune thyroiditis: Secondary | ICD-10-CM

## 2012-04-07 DIAGNOSIS — F3289 Other specified depressive episodes: Secondary | ICD-10-CM | POA: Insufficient documentation

## 2012-04-07 DIAGNOSIS — F411 Generalized anxiety disorder: Secondary | ICD-10-CM | POA: Insufficient documentation

## 2012-04-07 DIAGNOSIS — E069 Thyroiditis, unspecified: Secondary | ICD-10-CM | POA: Insufficient documentation

## 2012-04-07 DIAGNOSIS — J45909 Unspecified asthma, uncomplicated: Secondary | ICD-10-CM | POA: Insufficient documentation

## 2012-04-07 DIAGNOSIS — F329 Major depressive disorder, single episode, unspecified: Secondary | ICD-10-CM | POA: Insufficient documentation

## 2012-04-07 DIAGNOSIS — K219 Gastro-esophageal reflux disease without esophagitis: Secondary | ICD-10-CM | POA: Insufficient documentation

## 2012-04-07 DIAGNOSIS — Z01818 Encounter for other preprocedural examination: Secondary | ICD-10-CM | POA: Insufficient documentation

## 2012-04-07 DIAGNOSIS — E041 Nontoxic single thyroid nodule: Secondary | ICD-10-CM

## 2012-04-07 DIAGNOSIS — A609 Anogenital herpesviral infection, unspecified: Secondary | ICD-10-CM

## 2012-04-07 HISTORY — PX: THYROIDECTOMY: SHX17

## 2012-04-07 SURGERY — THYROIDECTOMY
Anesthesia: General | Site: Neck | Wound class: Clean

## 2012-04-07 MED ORDER — PROMETHAZINE HCL 25 MG/ML IJ SOLN
6.2500 mg | INTRAMUSCULAR | Status: AC | PRN
  Administered 2012-04-07 (×2): 6.25 mg via INTRAVENOUS

## 2012-04-07 MED ORDER — MORPHINE SULFATE 4 MG/ML IJ SOLN: 4.0000 mg | INTRAMUSCULAR | Status: DC | PRN

## 2012-04-07 MED ORDER — SUCCINYLCHOLINE CHLORIDE 20 MG/ML IJ SOLN
INTRAMUSCULAR | Status: DC | PRN
  Administered 2012-04-07: 120 mg via INTRAVENOUS

## 2012-04-07 MED ORDER — OXYCODONE HCL 5 MG PO TABS: 5.0000 mg | ORAL_TABLET | Freq: Once | ORAL | Status: DC | PRN

## 2012-04-07 MED ORDER — LIDOCAINE HCL (CARDIAC) 20 MG/ML IV SOLN
INTRAVENOUS | Status: DC | PRN
  Administered 2012-04-07: 100 mg via INTRAVENOUS

## 2012-04-07 MED ORDER — HYDROMORPHONE HCL PF 1 MG/ML IJ SOLN
0.2500 mg | INTRAMUSCULAR | Status: DC | PRN
  Administered 2012-04-07: 0.5 mg via INTRAVENOUS

## 2012-04-07 MED ORDER — MIDAZOLAM HCL 5 MG/5ML IJ SOLN
INTRAMUSCULAR | Status: DC | PRN
  Administered 2012-04-07 (×2): 1 mg via INTRAVENOUS

## 2012-04-07 MED ORDER — OXYCODONE-ACETAMINOPHEN 5-325 MG PO TABS
1.0000 | ORAL_TABLET | ORAL | Status: DC | PRN
  Administered 2012-04-07 – 2012-04-08 (×2): 1 via ORAL
  Filled 2012-04-07 (×2): qty 1

## 2012-04-07 MED ORDER — ROCURONIUM BROMIDE 100 MG/10ML IV SOLN
INTRAVENOUS | Status: DC | PRN
  Administered 2012-04-07: 30 mg via INTRAVENOUS
  Administered 2012-04-07 (×2): 5 mg via INTRAVENOUS

## 2012-04-07 MED ORDER — KCL IN DEXTROSE-NACL 20-5-0.45 MEQ/L-%-% IV SOLN
INTRAVENOUS | Status: DC
  Administered 2012-04-07: 16:00:00 via INTRAVENOUS
  Filled 2012-04-07 (×5): qty 1000

## 2012-04-07 MED ORDER — LIDOCAINE HCL 4 % MT SOLN
OROMUCOSAL | Status: DC | PRN
  Administered 2012-04-07: 4 mL via TOPICAL

## 2012-04-07 MED ORDER — PROPOFOL 10 MG/ML IV BOLUS
INTRAVENOUS | Status: DC | PRN
  Administered 2012-04-07: 120 mg via INTRAVENOUS

## 2012-04-07 MED ORDER — NADOLOL 40 MG PO TABS
40.0000 mg | ORAL_TABLET | Freq: Every day | ORAL | Status: DC
  Administered 2012-04-08 – 2012-04-09 (×2): 40 mg via ORAL
  Filled 2012-04-07 (×2): qty 1

## 2012-04-07 MED ORDER — EPHEDRINE SULFATE 50 MG/ML IJ SOLN
INTRAMUSCULAR | Status: DC | PRN
  Administered 2012-04-07 (×3): 5 mg via INTRAVENOUS

## 2012-04-07 MED ORDER — ONDANSETRON HCL 4 MG/2ML IJ SOLN
INTRAMUSCULAR | Status: DC | PRN
  Administered 2012-04-07: 4 mg via INTRAVENOUS

## 2012-04-07 MED ORDER — HEMOSTATIC AGENTS (NO CHARGE) OPTIME
TOPICAL | Status: DC | PRN
  Administered 2012-04-07: 1 via TOPICAL

## 2012-04-07 MED ORDER — LACTATED RINGERS IV SOLN
INTRAVENOUS | Status: DC
  Administered 2012-04-07: 13:00:00 via INTRAVENOUS

## 2012-04-07 MED ORDER — GLYCOPYRROLATE 0.2 MG/ML IJ SOLN
INTRAMUSCULAR | Status: DC | PRN
  Administered 2012-04-07: 0.4 mg via INTRAVENOUS

## 2012-04-07 MED ORDER — DEXTROSE 5 % IV SOLN
INTRAVENOUS | Status: DC | PRN
  Administered 2012-04-07: 13:00:00 via INTRAVENOUS

## 2012-04-07 MED ORDER — NEOSTIGMINE METHYLSULFATE 1 MG/ML IJ SOLN
INTRAMUSCULAR | Status: DC | PRN
  Administered 2012-04-07: 3 mg via INTRAVENOUS

## 2012-04-07 MED ORDER — ONDANSETRON HCL 4 MG/2ML IJ SOLN
4.0000 mg | Freq: Once | INTRAMUSCULAR | Status: AC | PRN
  Administered 2012-04-07: 4 mg via INTRAVENOUS

## 2012-04-07 MED ORDER — ONDANSETRON HCL 4 MG/2ML IJ SOLN
4.0000 mg | Freq: Four times a day (QID) | INTRAMUSCULAR | Status: DC | PRN
  Administered 2012-04-07: 4 mg via INTRAVENOUS
  Filled 2012-04-07: qty 2

## 2012-04-07 MED ORDER — FENTANYL CITRATE 0.05 MG/ML IJ SOLN
INTRAMUSCULAR | Status: DC | PRN
  Administered 2012-04-07: 150 ug via INTRAVENOUS
  Administered 2012-04-07 (×2): 25 ug via INTRAVENOUS
  Administered 2012-04-07: 50 ug via INTRAVENOUS
  Administered 2012-04-07 (×2): 25 ug via INTRAVENOUS
  Administered 2012-04-07 (×2): 50 ug via INTRAVENOUS

## 2012-04-07 MED ORDER — 0.9 % SODIUM CHLORIDE (POUR BTL) OPTIME
TOPICAL | Status: DC | PRN
  Administered 2012-04-07: 1000 mL

## 2012-04-07 MED ORDER — PROMETHAZINE HCL 25 MG/ML IJ SOLN
INTRAMUSCULAR | Status: AC
  Filled 2012-04-07: qty 1

## 2012-04-07 MED ORDER — KCL IN DEXTROSE-NACL 20-5-0.45 MEQ/L-%-% IV SOLN
INTRAVENOUS | Status: AC
  Filled 2012-04-07: qty 1000

## 2012-04-07 MED ORDER — LACTATED RINGERS IV SOLN
INTRAVENOUS | Status: DC | PRN
  Administered 2012-04-07 (×2): via INTRAVENOUS

## 2012-04-07 MED ORDER — LEVOTHYROXINE SODIUM 125 MCG PO TABS
125.0000 ug | ORAL_TABLET | Freq: Every day | ORAL | Status: DC
  Administered 2012-04-08 – 2012-04-09 (×2): 125 ug via ORAL
  Filled 2012-04-07 (×3): qty 1

## 2012-04-07 MED ORDER — OXYCODONE HCL 5 MG/5ML PO SOLN: 5.0000 mg | Freq: Once | ORAL | Status: DC | PRN

## 2012-04-07 MED ORDER — MEPERIDINE HCL 25 MG/ML IJ SOLN: 6.2500 mg | INTRAMUSCULAR | Status: DC | PRN

## 2012-04-07 MED ORDER — ONDANSETRON HCL 4 MG PO TABS
4.0000 mg | ORAL_TABLET | Freq: Four times a day (QID) | ORAL | Status: DC | PRN
  Administered 2012-04-08: 4 mg via ORAL
  Filled 2012-04-07: qty 1

## 2012-04-07 MED ORDER — HYDROMORPHONE HCL PF 1 MG/ML IJ SOLN
INTRAMUSCULAR | Status: AC
  Filled 2012-04-07: qty 1

## 2012-04-07 MED ORDER — ONDANSETRON HCL 4 MG/2ML IJ SOLN
INTRAMUSCULAR | Status: AC
  Filled 2012-04-07: qty 2

## 2012-04-07 SURGICAL SUPPLY — 50 items
BLADE SURG 10 STRL SS (BLADE) ×2 IMPLANT
BLADE SURG 15 STRL LF DISP TIS (BLADE) ×1 IMPLANT
BLADE SURG 15 STRL SS (BLADE) ×1
BLADE SURG ROTATE 9660 (MISCELLANEOUS) IMPLANT
CANISTER SUCTION 2500CC (MISCELLANEOUS) ×2 IMPLANT
CHLORAPREP W/TINT 26ML (MISCELLANEOUS) ×2 IMPLANT
CLIP TI MEDIUM 24 (CLIP) ×2 IMPLANT
CLIP TI WIDE RED SMALL 24 (CLIP) ×4 IMPLANT
CLOTH BEACON ORANGE TIMEOUT ST (SAFETY) ×2 IMPLANT
CONT SPEC 4OZ CLIKSEAL STRL BL (MISCELLANEOUS) IMPLANT
COVER SURGICAL LIGHT HANDLE (MISCELLANEOUS) ×2 IMPLANT
CRADLE DONUT ADULT HEAD (MISCELLANEOUS) ×2 IMPLANT
DERMABOND ADVANCED (GAUZE/BANDAGES/DRESSINGS) ×1
DERMABOND ADVANCED .7 DNX12 (GAUZE/BANDAGES/DRESSINGS) ×1 IMPLANT
DRAPE PED LAPAROTOMY (DRAPES) ×2 IMPLANT
DRAPE UTILITY 15X26 W/TAPE STR (DRAPE) ×4 IMPLANT
ELECT CAUTERY BLADE 6.4 (BLADE) ×4 IMPLANT
ELECT REM PT RETURN 9FT ADLT (ELECTROSURGICAL) ×2
ELECTRODE REM PT RTRN 9FT ADLT (ELECTROSURGICAL) ×1 IMPLANT
GAUZE SPONGE 4X4 16PLY XRAY LF (GAUZE/BANDAGES/DRESSINGS) ×6 IMPLANT
GLOVE BIO SURGEON STRL SZ7.5 (GLOVE) ×2 IMPLANT
GLOVE BIOGEL PI IND STRL 7.5 (GLOVE) ×2 IMPLANT
GLOVE BIOGEL PI INDICATOR 7.5 (GLOVE) ×2
GLOVE EUDERMIC 7 POWDERFREE (GLOVE) ×2 IMPLANT
GOWN PREVENTION PLUS XLARGE (GOWN DISPOSABLE) ×2 IMPLANT
GOWN STRL NON-REIN LRG LVL3 (GOWN DISPOSABLE) ×4 IMPLANT
HEMOSTAT SURGICEL 2X14 (HEMOSTASIS) ×2 IMPLANT
KIT BASIN OR (CUSTOM PROCEDURE TRAY) ×2 IMPLANT
KIT ROOM TURNOVER OR (KITS) ×2 IMPLANT
NS IRRIG 1000ML POUR BTL (IV SOLUTION) ×2 IMPLANT
PACK SURGICAL SETUP 50X90 (CUSTOM PROCEDURE TRAY) ×2 IMPLANT
PAD ARMBOARD 7.5X6 YLW CONV (MISCELLANEOUS) ×2 IMPLANT
PENCIL BUTTON HOLSTER BLD 10FT (ELECTRODE) ×4 IMPLANT
SHEARS HARMONIC 9CM CVD (BLADE) ×2 IMPLANT
SPECIMEN JAR MEDIUM (MISCELLANEOUS) ×2 IMPLANT
SPONGE GAUZE 4X4 12PLY (GAUZE/BANDAGES/DRESSINGS) IMPLANT
SPONGE INTESTINAL PEANUT (DISPOSABLE) ×2 IMPLANT
STAPLER VISISTAT 35W (STAPLE) ×2 IMPLANT
SUT MNCRL AB 4-0 PS2 18 (SUTURE) ×2 IMPLANT
SUT SILK 2 0 (SUTURE) ×1
SUT SILK 2-0 18XBRD TIE 12 (SUTURE) ×1 IMPLANT
SUT SILK 3 0 (SUTURE) ×1
SUT SILK 3-0 18XBRD TIE 12 (SUTURE) ×1 IMPLANT
SUT VIC AB 4-0 SH 18 (SUTURE) ×4 IMPLANT
SYR BULB 3OZ (MISCELLANEOUS) ×2 IMPLANT
TAPE CLOTH SOFT 2X10 (GAUZE/BANDAGES/DRESSINGS) ×2 IMPLANT
TOWEL OR 17X24 6PK STRL BLUE (TOWEL DISPOSABLE) ×2 IMPLANT
TOWEL OR 17X26 10 PK STRL BLUE (TOWEL DISPOSABLE) ×2 IMPLANT
TUBE CONNECTING 12X1/4 (SUCTIONS) ×2 IMPLANT
WATER STERILE IRR 1000ML POUR (IV SOLUTION) IMPLANT

## 2012-04-07 NOTE — Transfer of Care (Signed)
Immediate Anesthesia Transfer of Care Note  Patient: Colleen [name]  Procedure(s) Performed: Procedure(s) (LRB) with comments: THYROIDECTOMY (N/A) -    Patient Location: PACU  Anesthesia Type: General  Level of Consciousness: awake and oriented  Airway & Oxygen Therapy: Patient Spontanous Breathing and Patient connected to nasal cannula oxygen  Post-op Assessment: Report given to PACU RN  Post vital signs: Reviewed and stable  Complications: No apparent anesthesia complications

## 2012-04-07 NOTE — Progress Notes (Signed)
Report given to Kathy RN as primary care giver. 

## 2012-04-07 NOTE — H&P (View-Only) (Signed)
Patient ID: Colleen [name], female   DOB: 05/24/1951, 61 y.o.   MRN: [id number]  Chief Complaint  Patient presents with  . Thyroid Nodule    HPI Colleen [name] is a 61 y.o. female.  She comes in for evaluation for possible thyroidectomy. She has had a "goiter" for over 8 years. She's had multiple nodules noted. She was on Synthroid thyroid suppression several years ago but then stopped but restarted a few months ago. Recently she has noted some increase in the size of the nodule in her neck. She has slight compression symptoms when she swallows.she also notes some intermittent hoarseness with a "cracking" in her voice which she tries to sing. This has only been for a few months.she had an ultrasound-guided biopsy. 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 In addition she has nodules in both lobes as well as a large one in the isthmus. Some of these may be new. One of them has some calcifications. HPI  Past Medical History  Diagnosis Date  . Anxiety   . GERD (gastroesophageal reflux disease)   . Depression   . Heartburn   . Arrhythmia   . Asthma     DX IN MARCH  . Seizures     MEDICATION INDUCED    Past Surgical History  Procedure Date  . Spinal cystectomy   . Knee arthroscopy     right  . Cesarean section     x 2  . Pilonidal cyst excision   . Elbow surgery     right  . Colonoscopy     Family History  Problem Relation Age of Onset  . Heart failure Father   . Hypertension Father   . Seizures Father   . Heart disease Father   . Seizures Brother   . Irritable bowel syndrome Brother   . Cirrhosis Brother   . Hypertension Brother     x 4  . Fibromyalgia Sister   . Atrial fibrillation Sister   . Prostate cancer Brother   . Cervical cancer Mother     Social History History  Substance Use Topics  . Smoking status: Former Smoker  . Smokeless tobacco: Never Used  . Alcohol Use: Yes     Allergies  Allergen Reactions  . Cyclobenzaprine Hcl     REACTION: seizure?    Current Outpatient Prescriptions  Medication Sig Dispense Refill  . acyclovir (ZOVIRAX) 200 MG capsule Take 1 capsule (200 mg total) by mouth 3 (three) times daily.  90 capsule  3  . aspirin 81 MG tablet Take 81 mg by mouth daily.        . diazepam (VALIUM) 2 MG tablet Take 2 mg by mouth at bedtime as needed.       . diphenhydrAMINE (BENADRYL) 12.5 MG chewable tablet Chew 12.5 mg by mouth 4 (four) times daily as needed.      . levothyroxine (SYNTHROID, LEVOTHROID) 125 MCG tablet Take 1 tablet (125 mcg total) by mouth daily.  90 tablet  3  . nadolol (CORGARD) 40 MG tablet Take 1 tablet (40 mg total) by mouth daily.  100 tablet  3  . predniSONE (DELTASONE) 20 MG tablet Two tabs x 3 days, one tab x 3 days, a half a tab x 3 days, then a half a tablet Monday, Wednesday, Friday, for a two-week taper  30 tablet  1   Current Facility-Administered Medications  Medication Dose Route Frequency Provider Last Rate Last   Dose  . 0.9 %  sodium chloride infusion  500 mL Intravenous Continuous Dora M Brodie, MD        Review of Systems Review of Systems  Constitutional: Negative for fever, chills and unexpected weight change.  HENT: Positive for trouble swallowing and voice change. Negative for hearing loss, congestion and sore throat.   Eyes: Negative for visual disturbance.  Respiratory: Negative for cough and wheezing.   Cardiovascular: Negative for chest pain, palpitations and leg swelling.  Gastrointestinal: Negative for nausea, vomiting, abdominal pain, diarrhea, constipation, blood in stool, abdominal distention and anal bleeding.  Genitourinary: Negative for hematuria, vaginal bleeding and difficulty urinating.  Musculoskeletal: Negative for arthralgias.  Skin: Negative for rash and wound.  Neurological: Positive for headaches. Negative for seizures and syncope.  Hematological: Negative for adenopathy. Does  not bruise/bleed easily.  Psychiatric/Behavioral: Negative for confusion.    Blood pressure 138/80, pulse 77, temperature 96.5 F (35.8 C), temperature source Temporal, resp. rate 18, height 5' 4" (1.626 m), weight 152 lb 12.8 oz (69.31 kg), SpO2 98.00%.  Physical Exam Physical Exam  Vitals reviewed. Constitutional: She is oriented to person, place, and time. She appears well-developed and well-nourished. No distress.  HENT:  Head: Normocephalic and atraumatic.  Mouth/Throat: Oropharynx is clear and moist.  Eyes: Conjunctivae and EOM are normal. Pupils are equal, round, and reactive to light. No scleral icterus.  Neck: Normal range of motion. Neck supple. No tracheal deviation present. Mass and thyromegaly present.         Thyroid nodule, soft, no adenopathy  Cardiovascular: Normal rate, regular rhythm, normal heart sounds and intact distal pulses.  Exam reveals no gallop and no friction rub.   No murmur heard. Pulmonary/Chest: Effort normal and breath sounds normal. No respiratory distress. She has no wheezes. She has no rales.  Abdominal: Soft. Bowel sounds are normal. She exhibits no distension and no mass. There is no tenderness. There is no rebound and no guarding.  Musculoskeletal: Normal range of motion. She exhibits no edema and no tenderness.  Neurological: She is alert and oriented to person, place, and time.  Skin: Skin is warm and dry. No rash noted. She is not diaphoretic. No erythema.  Psychiatric: She has a normal mood and affect. Her behavior is normal. Judgment and thought content normal.    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 If no issues I recommended total  thyroidectomy, Reviewed risks and complications of this in detail       Sasan Wilkie J 03/17/2012, 10:32 AM    

## 2012-04-07 NOTE — Anesthesia Preprocedure Evaluation (Addendum)
Anesthesia Evaluation  Patient identified by MRN, date of birth, ID band Patient awake    Reviewed: Allergy & Precautions, H&P , NPO status , Patient's Chart, lab work & pertinent test results  History of Anesthesia Complications (+) Family history of anesthesia reaction  Airway Mallampati: I TM Distance: >3 FB Neck ROM: Full    Dental   Pulmonary asthma ,          Cardiovascular + dysrhythmias     Neuro/Psych Seizures -,  PSYCHIATRIC DISORDERS Anxiety Depression    GI/Hepatic Neg liver ROS, GERD-  Poorly Controlled,  Endo/Other  negative endocrine ROS  Renal/GU negative Renal ROS   Herpes    Musculoskeletal negative musculoskeletal ROS (+)   Abdominal   Peds  Hematology negative hematology ROS (+)   Anesthesia Other Findings   Reproductive/Obstetrics                          Anesthesia Physical Anesthesia Plan  ASA: II  Anesthesia Plan: General   Post-op Pain Management:    Induction: Intravenous  Airway Management Planned: Oral ETT  Additional Equipment:   Intra-op Plan:   Post-operative Plan: Extubation in OR  Informed Consent: I have reviewed the patients History and Physical, chart, labs and discussed the procedure including the risks, benefits and alternatives for the proposed anesthesia with the patient or authorized representative who has indicated his/her understanding and acceptance.   Dental advisory given  Plan Discussed with: Surgeon and CRNA  Anesthesia Plan Comments:        Anesthesia Quick Evaluation

## 2012-04-07 NOTE — Anesthesia Procedure Notes (Signed)
Procedure Name: Intubation Date/Time: 04/07/2012 1:25 PM Performed by: Julianne Rice K Pre-anesthesia Checklist: Patient identified, Emergency Drugs available, Timeout performed, Suction available and Patient being monitored Patient Re-evaluated:Patient Re-evaluated prior to inductionOxygen Delivery Method: Circle system utilized Preoxygenation: Pre-oxygenation with 100% oxygen Intubation Type: IV induction and Rapid sequence Ventilation: Mask ventilation without difficulty Laryngoscope Size: Mac and 3 Grade View: Grade II Tube size: 7.5 mm Number of attempts: 1 Airway Equipment and Method: Stylet and LTA kit utilized Placement Confirmation: positive ETCO2,  breath sounds checked- equal and bilateral and ETT inserted through vocal cords under direct vision Secured at: 23 cm Tube secured with: Tape Dental Injury: Teeth and Oropharynx as per pre-operative assessment

## 2012-04-07 NOTE — Progress Notes (Signed)
Dr. Michelle Piper called informed of patient's continued nausea orders received

## 2012-04-07 NOTE — Preoperative (Signed)
Beta Blockers   Reason not to administer Beta Blockers:Not Applicable 

## 2012-04-07 NOTE — Op Note (Signed)
Colleen [name] [facility] 1950/11/30 [id number] 03/17/2012  Preoperative diagnosis:multinodular goiter with thyroiditis and atypia on aspiration biopsy Postoperative diagnosis: same  Procedure: total thyroidectomy  Surgeon: Currie Paris, MD, FACS  Assistant: Dr. Chevis Pretty Anesthesia: General   Clinical History and Indications: this patient has a multinodular goiter with a very prominent large nodule in the isthmus. It has been enlarging and giving her some compression symptoms.he has not responded to thyroid suppression. An aspiration cytology has shown some thyroiditis as well as some atypia. After discussion with the patient she elected to proceed to total thyroidectomy.  Description of Procedure: I saw the patient in the preoperative area and reviewed the plans with her. She had no further questions and wanted to proceed.  The patient was taken to the operating room and after satisfactory general endotracheal anesthesia had been obtained the neck was prepped and draped as a sterile field in a time out was performed.  A transverse cervical incision was made, the platysma divided and subplatysmal flaps raised. A self-retaining retractor was placed. The midline fascia was opened exposing the thyroid gland. I freed up the left lateral aspect of the large isthmus nodule which was protruding at the very top of the isthmus. I then began to mobilize the left lobe medially. I divided small vessels coming into the thyroid staying in the plane of dissection. I used either clips or harmonic scalpel as needed. I then exposed the superior pole and dissected out the superior pole vessels and ligating them in continuity with 2-0 silk plus a clip and harmonic scalpel for the proximal side. This gave better mobility. The tissues were fairly "sticky "I think from the thyroiditis. I freed up the lower pole continued retracting the thyroid medially. I identified the recurrent laryngeal nerve and avoided injury to  this. As the thyroid was removed the left a little nubbin of thyroid tissue on the nerve so as to avoid damage. I saw appeared to be 1 clear parathyroid which was preserved. I do not see a second parathyroid. I then continued to mobilize the thyroid gland close to the right side of the trachea.  I then freed up the remainder of the superior pole nodule freeing up the right side. I then retracted the right lobe of the thyroid medially and proceeded to remove this in a similar fashion to the left side. Again the recurrent nerve was clearly identified and preserved. Once I had freed up to the trachea was able to remove the lateral attachments. The thyroid specimen was handed off after putting a suture to mark the superior pole of the left for orientation purposes.  There is a small piece of tissue which might represent a small thyroid nodule that was separate from the main thyroid or small lymph node which she took separately. I then thoroughly your gated in nature everything was completely dry on both sides. I put Surgicel and on both sides. When I thought everything was completely dry I closed the midline fascia 3-0 Vicryl, the platysma with 3-0 Vicryl, and the skin with a running 40 markings subcuticular followed by Dermabond.  The patient tolerated the procedure well. There were no operative complications. All counts were correct.  Currie Paris, MD, FACS 04/07/2012 5:57 PM

## 2012-04-07 NOTE — Interval H&P Note (Signed)
History and Physical Interval Note:  04/07/2012 1:03 PM  Colleen [name]  has presented today for surgery, with the diagnosis of THYROID NODULES  The various methods of treatment have been discussed with the patient and family. After consideration of risks, benefits and other options for treatment, the patient has consented to  Procedure(s) (LRB) with comments: THYROIDECTOMY (N/A) -   as a surgical intervention .  The patient's history has been reviewed, patient examined, no change in status, stable for surgery.  I have reviewed the patient's chart and labs.  Questions were answered to the patient's satisfaction.     Sumner Boesch J

## 2012-04-07 NOTE — Anesthesia Postprocedure Evaluation (Signed)
Anesthesia Post Note  Patient: Colleen [name]  Procedure(s) Performed: Procedure(s) (LRB): THYROIDECTOMY (N/A)  Anesthesia type: general  Patient location: PACU  Post pain: Pain level controlled  Post assessment: Patient's Cardiovascular Status Stable  Last Vitals:  Filed Vitals:   04/07/12 1630  BP: 172/123  Pulse: 52  Temp:   Resp: 14    Post vital signs: Reviewed and stable  Level of consciousness: sedated  Complications: No apparent anesthesia complications

## 2012-04-08 LAB — CALCIUM: Calcium: 7.8 mg/dL — ABNORMAL LOW (ref 8.4–10.5)

## 2012-04-08 MED ORDER — CALCIUM CARBONATE 1250 (500 CA) MG PO TABS
1.0000 | ORAL_TABLET | Freq: Three times a day (TID) | ORAL | Status: DC
  Administered 2012-04-08 – 2012-04-09 (×4): 500 mg via ORAL
  Filled 2012-04-08 (×6): qty 1

## 2012-04-08 MED ORDER — CALCIUM CARBONATE 1250 (500 CA) MG PO TABS: 1.0000 | ORAL_TABLET | Freq: Three times a day (TID) | ORAL | Status: DC

## 2012-04-08 MED ORDER — OXYCODONE-ACETAMINOPHEN 5-325 MG PO TABS: 1.0000 | ORAL_TABLET | ORAL | Status: DC | PRN

## 2012-04-08 NOTE — Progress Notes (Signed)
1 Day Post-Op   Assessment: s/p Procedure(s): THYROIDECTOMY Patient Active Problem List  Diagnosis  . ANXIETY  . GERD  . PALPITATIONS  . Fibrocystic breast changes  . Allergic rhinitis, seasonal  . Goiter diffuse  . Thyroid nodule    Stable postop Mild hypocalcemia Sore throat, likely secondary to endotracheal tube Postoperative nausea, nearly resolved  Plan: Advance diet we'll start on Os-Cal 3 times a day today. And will advance diet. May be able to be discharged late today or tomorrow depending on how diet as tolerated.  Subjective: The patient complains mainly of a sore throat. She has severe nausea last night but this is most completely resolved this morning. She has some mild incisional discomfort. She thinks her voice sounds normal.  Objective: Vital signs in last 24 hours: Temp:  [97 F (36.1 C)-98.2 F (36.8 C)] 98.2 F (36.8 C) (09/28 0507) Pulse Rate:  [51-72] 66  (09/28 0507) Resp:  [12-18] 16  (09/28 0507) BP: (116-178)/(67-99) 125/67 mmHg (09/28 0507) SpO2:  [95 %-100 %] 96 % (09/28 0507) Weight:  [158 lb 8 oz (71.895 kg)] 158 lb 8 oz (71.895 kg) (09/27 1802)   Intake/Output from previous day: 09/27 0701 - 09/28 0700 In: 2200 [I.V.:2200] Out: 500 [Urine:450; Blood:50] Intake/Output this shift:     General appearance: alert and no distress Resp: clear to auscultation bilaterally  Incision: healing well Mild erythema of posterior pharynx. Lab Results:  No results found for this basename: WBC:2,HGB:2,HCT:2,PLT:2 in the last 72 hours BMET  Basename 04/08/12 0704 04/07/12 2111  NA -- --  K -- --  CL -- --  CO2 -- --  GLUCOSE -- --  BUN -- --  CREATININE -- --  CALCIUM 7.8* 8.8   PT/INR No results found for this basename: LABPROT:2,INR:2 in the last 72 hours ABG No results found for this basename: PHART:2,PCO2:2,PO2:2,HCO3:2 in the last 72 hours  MEDS, Scheduled    . calcium carbonate  1 tablet Oral TID  .  ceFAZolin (ANCEF) IV  2 g  Intravenous 60 min Pre-Op  . dextrose 5 % and 0.45 % NaCl with KCl 20 mEq/L      . HYDROmorphone      . levothyroxine  125 mcg Oral QAC breakfast  . nadolol  40 mg Oral Daily  . ondansetron      . promethazine        Studies/Results: No results found.    LOS: 1 day     Currie Paris, MD, [facility], [place] [phone]   04/08/2012 9:47 AM

## 2012-04-08 NOTE — Plan of Care (Signed)
Problem: Phase I Progression Outcomes Goal: OOB as tolerated unless otherwise ordered Outcome: Not Progressing Patient states she is to nauseated to move.

## 2012-04-09 NOTE — Progress Notes (Signed)
2 Days Post-Op   Assessment: s/p Procedure(s): THYROIDECTOMY Patient Active Problem List  Diagnosis  . ANXIETY  . GERD  . PALPITATIONS  . Fibrocystic breast changes  . Allergic rhinitis, seasonal  . Goiter diffuse  . Thyroid nodule    Doing well, able to go home  Plan: Discharge wwe'll plan to see in the office in about 3 weeks. We'll have her check a calcium level in about 2 days.  Subjective: Feels good today. Anxious to go home. Sore throat is better. No muscle twitching or pins and needles sensations.  Objective: Vital signs in last 24 hours: Temp:  [98.2 F (36.8 C)-98.8 F (37.1 C)] 98.6 F (37 C) (09/29 0521) Pulse Rate:  [60-66] 66  (09/29 0521) Resp:  [18] 18  (09/29 0521) BP: (128-140)/(70-76) 128/70 mmHg (09/29 0521) SpO2:  [94 %-97 %] 94 % (09/29 0521)   Intake/Output from previous day: 09/28 0701 - 09/29 0700 In: 2160 [P.O.:360; I.V.:1800] Out: 3700 [Urine:3700] Intake/Output this shift:     General appearance: alert, cooperative, no distress and voice sounds normal  Incision: healing well  Lab Results:  No results found for this basename: WBC:2,HGB:2,HCT:2,PLT:2 in the last 72 hours BMET  Basename 04/08/12 0704 04/07/12 2111  NA -- --  K -- --  CL -- --  CO2 -- --  GLUCOSE -- --  BUN -- --  CREATININE -- --  CALCIUM 7.8* 8.8   PT/INR No results found for this basename: LABPROT:2,INR:2 in the last 72 hours ABG No results found for this basename: PHART:2,PCO2:2,PO2:2,HCO3:2 in the last 72 hours  MEDS, Scheduled    . calcium carbonate  1 tablet Oral TID  . levothyroxine  125 mcg Oral QAC breakfast  . nadolol  40 mg Oral Daily    Studies/Results: No results found.    LOS: 2 days     Currie Paris, MD, [facility], [place] [phone]   04/09/2012 9:39 AM

## 2012-04-09 NOTE — Progress Notes (Signed)
+  Discharge instructions gone over with patient, ome medications gone over. Pain script given to patient. Incisional care,. diet, activity, and signs and symptoms of infection and when to call the doctor gone over with patient.  Follow appointment is to be made for calcium level. Patient has knowledge of diet needs. She has verbalized understanding of instructions and verbalized reason to call

## 2012-04-10 ENCOUNTER — Encounter (HOSPITAL_COMMUNITY): Payer: Self-pay | Admitting: Surgery

## 2012-04-10 ENCOUNTER — Telehealth (INDEPENDENT_AMBULATORY_CARE_PROVIDER_SITE_OTHER): Payer: Self-pay | Admitting: General Surgery

## 2012-04-10 DIAGNOSIS — Z9889 Other specified postprocedural states: Secondary | ICD-10-CM

## 2012-04-10 NOTE — Telephone Encounter (Signed)
Message copied by Liliana Cline on Mon Apr 10, 2012  1:10 PM ------      Message from: Currie Paris      Created: Sun Apr 09, 2012  9:43 AM       She needs to have a calcium level checked on Tuesday. Went home on Sunday and already has post op visit scheduled

## 2012-04-10 NOTE — Telephone Encounter (Signed)
Patient aware to get calcium drawn. Orders placed and patient to go to [facility] tomorrow. We will call patient with results and when pathology results come in.

## 2012-04-11 ENCOUNTER — Telehealth (INDEPENDENT_AMBULATORY_CARE_PROVIDER_SITE_OTHER): Payer: Self-pay | Admitting: General Surgery

## 2012-04-11 LAB — CALCIUM: Calcium: 9.3 mg/dL (ref 8.4–10.5)

## 2012-04-11 NOTE — Telephone Encounter (Signed)
Left message on machine for patient to call back for path results.   

## 2012-04-11 NOTE — Telephone Encounter (Signed)
Patient made aware of path results. Will follow up at appt and call with any questions prior.  

## 2012-04-11 NOTE — Telephone Encounter (Signed)
Message copied by Liliana Cline on Tue Apr 11, 2012  1:59 PM ------      Message from: Currie Paris      Created: Tue Apr 11, 2012 12:33 PM       Tell her path is benign  Goiter with nodules and some thyroiditis, or thyroid inflammation, and as expected No Cancer

## 2012-04-12 ENCOUNTER — Telehealth (INDEPENDENT_AMBULATORY_CARE_PROVIDER_SITE_OTHER): Payer: Self-pay | Admitting: General Surgery

## 2012-04-12 NOTE — Telephone Encounter (Signed)
Patient made aware of normal calcium and directions for her Oscal. She will call with any questions.

## 2012-04-12 NOTE — Telephone Encounter (Signed)
Message copied by Liliana Cline on Wed Apr 12, 2012  9:59 AM ------      Message from: Currie Paris      Created: Wed Apr 12, 2012  7:31 AM       Tell her the Calcium level is back to norma. She should reduce oscal to twice daily for five days, then daily. Probably good for prevention of osteoporosis to stay on one per day

## 2012-04-28 ENCOUNTER — Encounter (INDEPENDENT_AMBULATORY_CARE_PROVIDER_SITE_OTHER): Payer: Self-pay | Admitting: Surgery

## 2012-04-28 ENCOUNTER — Ambulatory Visit (INDEPENDENT_AMBULATORY_CARE_PROVIDER_SITE_OTHER): Payer: Managed Care, Other (non HMO) | Admitting: Surgery

## 2012-04-28 VITALS — BP 120/84 | HR 76 | Temp 97.7°F | Resp 20 | Ht 64.5 in | Wt 154.0 lb

## 2012-04-28 DIAGNOSIS — E041 Nontoxic single thyroid nodule: Secondary | ICD-10-CM

## 2012-04-28 DIAGNOSIS — Z9889 Other specified postprocedural states: Secondary | ICD-10-CM

## 2012-04-28 NOTE — Progress Notes (Signed)
NAME: Colleen [name]                                            DOB: 1950/10/31 DATE: 04/28/2012                                                  MRN: [id number]  CC: Post op   HPI: This patient comes in for post op follow-up .Sheunderwent total thyroidectomy on 04/07/2012. She feels that she is doing well.her voice seems to get weak after use but this is improving PE:  VITAL SIGNS: BP 120/84  Pulse 76  Temp 97.7 F (36.5 C) (Temporal)  Resp 20  Ht 5' 4.5" (1.638 m)  Wt 154 lb (69.854 kg)  BMI 26.03 kg/m2  General: The patient appears to be healthy, NAD Incision is healing nicely. There still some edema but this should improve. There is no evidence of infection or problems.  DATA REVIEWED: Pathology report showed a multinodular goiter with diffuse lymphocytic thyroiditis  IMPRESSION: The patient is doing well S/P oophorectomy.    PLAN: I told her she could resume normal activities. We will see her back when necessary. She will followup with Dr. Tawanna Cooler for adjustment of her thyroid replacement medications. I gave her a copy of the pathology report and reviewed it with her.

## 2012-04-28 NOTE — Patient Instructions (Signed)
We will see you again on an as needed basis. Please call the office at [phone] if you have any questions or concerns. Thank you for allowing [name] to take care of you. See Dr Tawanna Cooler for follow up and adjustment of your thyroid medication

## 2012-05-08 ENCOUNTER — Telehealth: Payer: Self-pay | Admitting: Family Medicine

## 2012-05-08 NOTE — Telephone Encounter (Signed)
Caller: [name]/Patient; Phone: [phone]; Reason for Call: Caller is out of Acyclovir.  No current symptoms but wants to have medication on hand.  Request if it is possible to have it filled with [facility] as listed in chart, if it is not [facility] please send to [facility] [phone].

## 2012-05-09 MED ORDER — ACYCLOVIR 200 MG PO CAPS: 200.0000 mg | ORAL_CAPSULE | Freq: Three times a day (TID) | ORAL | Status: DC | PRN

## 2012-05-09 NOTE — Telephone Encounter (Signed)
Rx sent to [facility]

## 2012-06-10 ENCOUNTER — Ambulatory Visit (HOSPITAL_COMMUNITY)
Admission: RE | Admit: 2012-06-10 | Discharge: 2012-06-10 | Disposition: A | Discharge status: Home / Self Care | Payer: Managed Care, Other (non HMO) | Source: Ambulatory Visit | Attending: Emergency Medicine | Admitting: Emergency Medicine

## 2012-06-10 ENCOUNTER — Encounter (HOSPITAL_COMMUNITY): Payer: Self-pay

## 2012-06-10 ENCOUNTER — Ambulatory Visit: Payer: Managed Care, Other (non HMO)

## 2012-06-10 ENCOUNTER — Other Ambulatory Visit: Payer: Self-pay | Admitting: *Deleted

## 2012-06-10 ENCOUNTER — Ambulatory Visit: Payer: Managed Care, Other (non HMO) | Admitting: Family Medicine

## 2012-06-10 VITALS — BP 142/89 | HR 60 | Temp 97.9°F | Resp 16 | Ht 65.5 in | Wt 156.0 lb

## 2012-06-10 DIAGNOSIS — R079 Chest pain, unspecified: Secondary | ICD-10-CM

## 2012-06-10 DIAGNOSIS — R21 Rash and other nonspecific skin eruption: Secondary | ICD-10-CM

## 2012-06-10 LAB — CREATININE, SERUM
Creatinine, Ser: 0.96 mg/dL (ref 0.50–1.10)
GFR calc Af Amer: 73 mL/min — ABNORMAL LOW (ref >90)
GFR calc non Af Amer: 63 mL/min — ABNORMAL LOW (ref >90)

## 2012-06-10 MED ORDER — OXYCODONE-ACETAMINOPHEN 5-325 MG PO TABS: 1.0000 | ORAL_TABLET | Freq: Three times a day (TID) | ORAL | Status: DC | PRN

## 2012-06-10 MED ORDER — VALACYCLOVIR HCL 1 G PO TABS: 1000.0000 mg | ORAL_TABLET | Freq: Three times a day (TID) | ORAL | Status: DC

## 2012-06-10 MED ORDER — IOHEXOL 350 MG/ML SOLN
100.0000 mL | Freq: Once | INTRAVENOUS | Status: AC | PRN
  Administered 2012-06-10: 100 mL via INTRAVENOUS

## 2012-06-10 NOTE — Progress Notes (Signed)
61 yo female c/o pain behind Left Shoulder blade and radiates around front to her rib cage.  Pain started yesterday afternoon.  States she thought it was getting better but certain movements brings the pain back.  She has been using a heating pad and has some oxycodone from past thyroid surgery. States it does hurt to breath.  Objective.  Red, slightly raised confluent rash on Left Shoulder blade without vesiculation. Denies any sensitivity to skin. Nontender back Lungs clear [facility] reading (PRIMARY) by  Dr. Milus Glazier:  Neg chest film Patient in severe pain with deep breath or movement.. No leg pain. Heart regular and without murmur  Assessment: The history of his rapid crescendo sharp pain with pleuritic component is atypical for shingles even though the patient does have a rash beginning in the distribution of her pain. She has no other symptoms of pulmonary embolus other than the pleuritic pain, but the pain is so intense that a CT angiogram is indicated.  Plan: Valtrex, oxycodone CT angio now.

## 2012-06-10 NOTE — Patient Instructions (Addendum)

## 2012-06-11 ENCOUNTER — Telehealth: Payer: Self-pay

## 2012-06-11 NOTE — Telephone Encounter (Signed)
lmom to cb. 

## 2012-06-11 NOTE — Telephone Encounter (Signed)
Advised pt of note and to call if she cannot go in tom for note

## 2012-06-11 NOTE — Telephone Encounter (Signed)
Patient is calling wanting to know if its ok for her to go to work tomorrow.   Best #: [phone]

## 2012-06-11 NOTE — Telephone Encounter (Signed)
If patient is feeling better, she may return to work tomorrow.

## 2012-06-22 ENCOUNTER — Telehealth: Payer: Self-pay | Admitting: Family Medicine

## 2012-06-22 MED ORDER — DIAZEPAM 2 MG PO TABS: 2.0000 mg | ORAL_TABLET | Freq: Two times a day (BID) | ORAL | Status: DC

## 2012-06-22 MED ORDER — ACYCLOVIR 200 MG PO CAPS: 200.0000 mg | ORAL_CAPSULE | Freq: Three times a day (TID) | ORAL | Status: DC

## 2012-06-22 NOTE — Telephone Encounter (Signed)
Patient called stating that [facility] her refill request for the acyclovir 200mg  1po tid prn for outbreaks. Patient also need a new script sent in for diazepam 2mg  1pobid. Patient states that she had her thyroid removed and would like to know the next step. Please advise and assist.

## 2012-06-22 NOTE — Telephone Encounter (Signed)
Fleet Contras okay to refill medication  Our records shows she is on a thyroid supplement 125 mcg daily she needs a TSH level next week nonfasting

## 2012-06-27 ENCOUNTER — Other Ambulatory Visit (INDEPENDENT_AMBULATORY_CARE_PROVIDER_SITE_OTHER): Payer: Managed Care, Other (non HMO)

## 2012-06-27 DIAGNOSIS — Z9889 Other specified postprocedural states: Secondary | ICD-10-CM

## 2012-07-07 ENCOUNTER — Telehealth: Payer: Self-pay | Admitting: Family Medicine

## 2012-07-07 DIAGNOSIS — R079 Chest pain, unspecified: Secondary | ICD-10-CM

## 2012-07-07 MED ORDER — DIAZEPAM 2 MG PO TABS: 2.0000 mg | ORAL_TABLET | Freq: Two times a day (BID) | ORAL | Status: DC

## 2012-07-07 MED ORDER — VALACYCLOVIR HCL 1 G PO TABS: 1000.0000 mg | ORAL_TABLET | Freq: Three times a day (TID) | ORAL | Status: DC

## 2012-07-07 NOTE — Telephone Encounter (Signed)
Patient aware.

## 2012-07-07 NOTE — Telephone Encounter (Signed)
Patient called stating that [facility] states they never received her refills of her acyclovir 200mg  1po TID and her diazepam 2mg  1po BID for sleep and it will need to be resent. There fax number is [phone]. Please assist.

## 2012-07-07 NOTE — Telephone Encounter (Signed)
Hard copy faxed to [facility].

## 2012-11-13 ENCOUNTER — Other Ambulatory Visit: Payer: Self-pay | Admitting: Family Medicine

## 2013-01-03 ENCOUNTER — Telehealth: Payer: Self-pay | Admitting: Family Medicine

## 2013-01-03 MED ORDER — LEVOTHYROXINE SODIUM 125 MCG PO TABS: ORAL_TABLET | ORAL | Status: DC

## 2013-01-03 NOTE — Telephone Encounter (Signed)
PT called to schedule her physical, but will not be able to have one until 03/05/13. She states that she will need one more refill of her levothyroxine (SYNTHROID, LEVOTHROID) 125 MCG tablet, prior to then . She would [facility]. Please assist.

## 2013-01-03 NOTE — Telephone Encounter (Signed)
rx sent

## 2013-01-31 ENCOUNTER — Telehealth: Payer: Self-pay | Admitting: Family Medicine

## 2013-01-31 NOTE — Telephone Encounter (Addendum)
Pt needs refill on diazepam 2 mg #180 sent [facility]. Pt has [facility] sch for 04-05-13

## 2013-02-01 ENCOUNTER — Other Ambulatory Visit: Payer: Self-pay | Admitting: Family Medicine

## 2013-02-01 MED ORDER — DIAZEPAM 2 MG PO TABS: 2.0000 mg | ORAL_TABLET | Freq: Two times a day (BID) | ORAL | Status: DC

## 2013-02-01 NOTE — Telephone Encounter (Signed)
Rx signed and faxed to [facility] 

## 2013-02-01 NOTE — Telephone Encounter (Signed)
Please have Dr Kirtland Bouchard sign and fax to [facility] with no refills - thanks

## 2013-02-16 ENCOUNTER — Other Ambulatory Visit: Payer: Self-pay

## 2013-02-16 DIAGNOSIS — Z1231 Encounter for screening mammogram for malignant neoplasm of breast: Secondary | ICD-10-CM

## 2013-02-20 ENCOUNTER — Ambulatory Visit (INDEPENDENT_AMBULATORY_CARE_PROVIDER_SITE_OTHER): Payer: BC Managed Care – PPO | Admitting: Family Medicine

## 2013-02-20 ENCOUNTER — Encounter: Payer: Self-pay | Admitting: Family Medicine

## 2013-02-20 VITALS — BP 136/80 | HR 65 | Temp 98.5°F | Wt 158.0 lb

## 2013-02-20 DIAGNOSIS — B353 Tinea pedis: Secondary | ICD-10-CM

## 2013-02-20 NOTE — Patient Instructions (Addendum)
Lamisil twice daily for 4-6 weeks  -keep feet clean and dry, change socks frequently  -follow up in 4-6 weeks if not resolved

## 2013-02-20 NOTE — Progress Notes (Signed)
Chief Complaint  Patient presents with  . Rash    On top of rt foot, between toes and bottom of foot.     HPI:  Acute visit for little toe pain: -started about 6 weeks ago -itchy, flaky, pealing skin -has been soaking in epson salts -tried bacitracin 2 days ago -denies: drainage, fevers, malaise, chills ROS: See pertinent positives and negatives per HPI.  Past Medical History  Diagnosis Date  . Anxiety   . GERD (gastroesophageal reflux disease)   . Heartburn   . Seizures     MEDICATION INDUCED  . Family history of anesthesia complication     mother "nausea and vomiting post surger"  . Arrhythmia     "does not see cardiologist, sees pcp, Dr. Alonza Smoker  . Asthma     DX IN MARCH 2012 "allergy induced asthma"  . Bronchitis     hx of  . Urinary tract infection     hx of  . Herpes     on medication for outbreaks only  . Depression     "past hx of for Panic attacks, not on medication at this time"  . 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 Allergy     Family History  Problem Relation Age of Onset  . Heart failure Father   . Hypertension Father   . Seizures Father   . Heart disease Father   . Seizures Brother   . Irritable bowel syndrome Brother   . Cirrhosis Brother   . Hypertension Brother     x 4  . Fibromyalgia Sister   . Atrial fibrillation Sister   . Prostate cancer Brother   . Cervical cancer Mother     History   Social History  . Marital Status: Married    Spouse Name: N/A    Number of Children: 2  . Years of Education: N/A   Occupational History  . Patient Care Customer Service    Social History Main Topics  . Smoking status: Former Games developer  . Smokeless tobacco: Never Used  . Alcohol Use: Yes     Comment: occasional  . Drug Use: No  . Sexually Active: None   Other Topics Concern  . None   Social History Narrative  . None    Current  outpatient prescriptions:acyclovir (ZOVIRAX) 200 MG capsule, Take 1 capsule (200 mg total) by mouth 3 (three) times daily., Disp: 270 capsule, Rfl: 3;  diazepam (VALIUM) 2 MG tablet, Take 1 tablet (2 mg total) by mouth 2 (two) times daily. For sleep, Disp: 180 tablet, Rfl: 0;  diphenhydrAMINE (BENADRYL) 25 MG tablet, Take 25 mg by mouth. PRN for sleep, Disp: , Rfl:  ibuprofen (ADVIL,MOTRIN) 800 MG tablet, Take 800 mg by mouth every 8 (eight) hours as needed., Disp: , Rfl: ;  levothyroxine (SYNTHROID, LEVOTHROID) 125 MCG tablet, TAKE 1 TABLET DAILY, Disp: 90 tablet, Rfl: 0;  nadolol (CORGARD) 40 MG tablet, , Disp: , Rfl:   EXAM:  Filed Vitals:   02/20/13 1609  BP: 136/80  Pulse: 65  Temp: 98.5 F (36.9 C)    Body mass index is 25.88 kg/(m^2).  GENERAL: vitals reviewed and listed above, alert, oriented, appears well hydrated and in no acute distress  HEENT: atraumatic, conjunttiva clear, no obvious abnormalities on inspection of external nose and ears  NECK: no obvious masses on inspection  SKIN: pealing skin on R pinky and forth plantar  toe and forefoot; no signs of bacterial infection  MS: moves all extremities without noticeable abnormality  PSYCH: pleasant and cooperative, no obvious depression or anxiety  ASSESSMENT AND PLAN:  Discussed the following assessment and plan:  Tinea pedis  -Patient advised to return or notify a doctor immediately if symptoms worsen or persist or new concerns arise.  Patient Instructions  Lamisil twice daily for 4-6 weeks  -keep feet clean and dry, change socks frequently  -follow up in 4-6 weeks if not resolved     Kitai Purdom R.

## 2013-02-26 ENCOUNTER — Other Ambulatory Visit: Payer: Managed Care, Other (non HMO)

## 2013-03-05 ENCOUNTER — Encounter: Payer: Managed Care, Other (non HMO) | Admitting: Family Medicine

## 2013-03-08 ENCOUNTER — Ambulatory Visit
Admission: RE | Admit: 2013-03-08 | Discharge: 2013-03-08 | Disposition: A | Discharge status: Home / Self Care | Payer: BC Managed Care – PPO | Source: Ambulatory Visit

## 2013-03-08 DIAGNOSIS — Z1231 Encounter for screening mammogram for malignant neoplasm of breast: Secondary | ICD-10-CM

## 2013-03-29 ENCOUNTER — Other Ambulatory Visit (INDEPENDENT_AMBULATORY_CARE_PROVIDER_SITE_OTHER): Payer: BC Managed Care – PPO

## 2013-03-29 DIAGNOSIS — Z0000 Encounter for general adult medical examination without abnormal findings: Secondary | ICD-10-CM

## 2013-03-29 LAB — BASIC METABOLIC PANEL
BUN: 13 mg/dL (ref 6–23)
CO2: 26 mEq/L (ref 19–32)
Glucose, Bld: 94 mg/dL (ref 70–99)
Potassium: 4 mEq/L (ref 3.5–5.1)
Sodium: 137 mEq/L (ref 135–145)

## 2013-03-29 LAB — POCT URINALYSIS DIPSTICK
Bilirubin, UA: NEGATIVE
Glucose, UA: NEGATIVE
Ketones, UA: NEGATIVE
Spec Grav, UA: 1.01
Urobilinogen, UA: 0.2

## 2013-03-29 LAB — HEPATIC FUNCTION PANEL
AST: 26 U/L (ref 0–37)
Total Bilirubin: 0.4 mg/dL (ref 0.3–1.2)

## 2013-03-29 LAB — CBC WITH DIFFERENTIAL/PLATELET
Basophils Absolute: 0 10*3/uL (ref 0.0–0.1)
Eosinophils Absolute: 0.1 10*3/uL (ref 0.0–0.7)
HCT: 35.6 % — ABNORMAL LOW (ref 36.0–46.0)
Hemoglobin: 12.4 g/dL (ref 12.0–15.0)
Lymphs Abs: 1.3 10*3/uL (ref 0.7–4.0)
MCHC: 34.9 g/dL (ref 30.0–36.0)
MCV: 94.7 fl (ref 78.0–100.0)
Monocytes Absolute: 0.3 10*3/uL (ref 0.1–1.0)
Neutro Abs: 1.8 10*3/uL (ref 1.4–7.7)
RDW: 12.6 % (ref 11.5–14.6)

## 2013-03-29 LAB — TSH: TSH: 0.27 u[IU]/mL — ABNORMAL LOW (ref 0.35–5.50)

## 2013-03-29 LAB — LDL CHOLESTEROL, DIRECT: Direct LDL: 109.9 mg/dL

## 2013-03-29 LAB — LIPID PANEL: VLDL: 57.4 mg/dL — ABNORMAL HIGH (ref 0.0–40.0)

## 2013-04-05 ENCOUNTER — Encounter: Payer: Managed Care, Other (non HMO) | Admitting: Family Medicine

## 2013-04-05 ENCOUNTER — Encounter: Payer: Self-pay | Admitting: Internal Medicine

## 2013-04-05 ENCOUNTER — Ambulatory Visit (INDEPENDENT_AMBULATORY_CARE_PROVIDER_SITE_OTHER): Payer: BC Managed Care – PPO | Admitting: Internal Medicine

## 2013-04-05 VITALS — BP 130/80 | HR 62 | Temp 98.1°F | Resp 18 | Ht 65.0 in | Wt 162.0 lb

## 2013-04-05 DIAGNOSIS — R002 Palpitations: Secondary | ICD-10-CM

## 2013-04-05 DIAGNOSIS — Z23 Encounter for immunization: Secondary | ICD-10-CM

## 2013-04-05 DIAGNOSIS — K219 Gastro-esophageal reflux disease without esophagitis: Secondary | ICD-10-CM

## 2013-04-05 DIAGNOSIS — Z0000 Encounter for general adult medical examination without abnormal findings: Secondary | ICD-10-CM

## 2013-04-05 DIAGNOSIS — R141 Gas pain: Secondary | ICD-10-CM

## 2013-04-05 MED ORDER — DIAZEPAM 2 MG PO TABS: 2.0000 mg | ORAL_TABLET | Freq: Two times a day (BID) | ORAL | Status: DC

## 2013-04-05 MED ORDER — NADOLOL 40 MG PO TABS: 20.0000 mg | ORAL_TABLET | Freq: Every day | ORAL | Status: DC

## 2013-04-05 MED ORDER — LEVOTHYROXINE SODIUM 125 MCG PO TABS: ORAL_TABLET | ORAL | Status: DC

## 2013-04-05 NOTE — Progress Notes (Signed)
Subjective:    Patient ID: Colleen [name], female    DOB: 06/20/1951, 62 y.o.   MRN: [id number]  HPI  62 year old patient who is in today for a preventive health examination.  She has done reasonably well only real complaint severity are related which have been chronic. She does have a history of esophageal reflux and is complaining of some mild bloating and some intermittent right upper quadrant discomfort. She does have a history of mild fatty liver disease and intermittently elevated LFTs. She has not benefited from PPI therapy in the past. She has had upper endoscopy as well as a colonoscopy about 7 years ago Earlier this year she underwent a thyroidectomy  Past Medical History  Diagnosis Date  . Anxiety   . GERD (gastroesophageal reflux disease)   . Heartburn   . Seizures     MEDICATION INDUCED  . Family history of anesthesia complication     mother "nausea and vomiting post surger"  . Arrhythmia     "does not see cardiologist, sees pcp, Dr. Alonza Smoker  . Asthma     DX IN MARCH 2012 "allergy induced asthma"  . Bronchitis     hx of  . Urinary tract infection     hx of  . Herpes     on medication for outbreaks only  . Depression     "past hx of for Panic attacks, not on medication at this time"  . 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 Allergy     History   Social History  . Marital Status: Married    Spouse Name: N/A    Number of Children: 2  . Years of Education: N/A   Occupational History  . Patient Care Customer Service    Social History Main Topics  . Smoking status: Former Games developer  . Smokeless tobacco: Never Used  . Alcohol Use: Yes     Comment: occasional  . Drug Use: No  . Sexual Activity: Not on file   Other Topics Concern  . Not on file   Social History Narrative  . No narrative on file    Past Surgical History  Procedure Laterality Date  . Spinal  cystectomy      "where pilonidal cyst was"  . Knee arthroscopy      right  . Cesarean section      x 2  . Pilonidal cyst excision    . Elbow surgery      right  . Colonoscopy    . Thyroidectomy  04/07/2012    Procedure: THYROIDECTOMY;  Surgeon: Currie Paris, MD;  Location: [facility] OR;  Service: General;  Laterality: N/A;       Family History  Problem Relation Age of Onset  . Heart failure Father   . Hypertension Father   . Seizures Father   . Heart disease Father   . Seizures Brother   . Irritable bowel syndrome Brother   . Cirrhosis Brother   . Hypertension Brother     x 4  . Fibromyalgia Sister   . Atrial fibrillation Sister   . Prostate cancer Brother   . Cervical cancer Mother     Allergies  Allergen Reactions  . Cyclobenzaprine Hcl     REACTION: seizure    Current Outpatient Prescriptions on File Prior to Visit  Medication Sig Dispense Refill  . diphenhydrAMINE (BENADRYL) 25 MG tablet Take 25 mg by  mouth. PRN for sleep      . ibuprofen (ADVIL,MOTRIN) 800 MG tablet Take 800 mg by mouth every 8 (eight) hours as needed.       No current facility-administered medications on file prior to visit.    BP 130/80  Pulse 62  Temp(Src) 98.1 F (36.7 C) (Oral)  Resp 18  Ht 5\' 5"  (1.651 m)  Wt 162 lb (73.483 kg)  BMI 26.96 kg/m2  SpO2 96%     Review of Systems  Constitutional: Negative for fever, appetite change, fatigue and unexpected weight change.  HENT: Negative for hearing loss, ear pain, nosebleeds, congestion, sore throat, mouth sores, trouble swallowing, neck stiffness, dental problem, voice change, sinus pressure and tinnitus.   Eyes: Negative for photophobia, pain, redness and visual disturbance.  Respiratory: Negative for cough, chest tightness and shortness of breath.   Cardiovascular: Negative for chest pain, palpitations and leg swelling.  Gastrointestinal: Positive for abdominal distention. Negative for nausea, vomiting, abdominal pain, diarrhea,  constipation, blood in stool and rectal pain.  Genitourinary: Negative for dysuria, urgency, frequency, hematuria, flank pain, vaginal bleeding, vaginal discharge, difficulty urinating, genital sores, vaginal pain, menstrual problem and pelvic pain.  Musculoskeletal: Negative for back pain and arthralgias.  Skin: Negative for rash.  Neurological: Negative for dizziness, syncope, speech difficulty, weakness, light-headedness, numbness and headaches.  Hematological: Negative for adenopathy. Does not bruise/bleed easily.  Psychiatric/Behavioral: Negative for suicidal ideas, behavioral problems, self-injury, dysphoric mood and agitation. The patient is not nervous/anxious.        Objective:   Physical Exam  Constitutional: She is oriented to person, place, and time. She appears well-developed and well-nourished.  HENT:  Head: Normocephalic and atraumatic.  Right Ear: External ear normal.  Left Ear: External ear normal.  Mouth/Throat: Oropharynx is clear and moist.  Eyes: Conjunctivae and EOM are normal.  Neck: Normal range of motion. Neck supple. No JVD present. No thyromegaly present.  Thyroidectomy scar  Cardiovascular: Normal rate, regular rhythm, normal heart sounds and intact distal pulses.   No murmur heard. Pulmonary/Chest: Effort normal and breath sounds normal. She has no wheezes. She has no rales.  Abdominal: Soft. Bowel sounds are normal. She exhibits no distension and no mass. There is no tenderness. There is no rebound and no guarding.  Musculoskeletal: Normal range of motion. She exhibits no edema and no tenderness.  Lymphadenopathy:    She has no cervical adenopathy.  Neurological: She is alert and oriented to person, place, and time. She has normal reflexes. No cranial nerve deficit. She exhibits normal muscle tone. Coordination normal.  Skin: Skin is warm and dry. No rash noted.  Psychiatric: She has a normal mood and affect. Her behavior is normal.           Assessment & Plan:   Preventive health exam Dyspepsia History of reflux  Medications updated Lab reviewed Basal continue regular exercise regimen. She has joined a Psychologist, forensic and exercises at least 3 times per week Antireflux regimen discussed and information provided

## 2013-04-05 NOTE — Patient Instructions (Signed)
It is important that you exercise regularly, at least 20 minutes 3 to 4 times per week.  If you develop chest pain or shortness of breath seek  medical attention.  Avoids foods high in acid such as tomatoes citrus juices, and spicy foods.  Avoid eating within two hours of lying down or before exercising.  Do not overheat.  Try smaller more frequent meals.  If symptoms persist, elevate the head of her bed 12 inches while sleeping.  Return in one year for follow-up Diet for Gastroesophageal Reflux Disease, Adult Reflux (acid reflux) is when acid from your stomach flows up into the esophagus. When acid comes in contact with the esophagus, the acid causes irritation and soreness (inflammation) in the esophagus. When reflux happens often or so severely that it causes damage to the esophagus, it is called gastroesophageal reflux disease (GERD). Nutrition therapy can help ease the discomfort of GERD. FOODS OR DRINKS TO AVOID OR LIMIT  Smoking or chewing tobacco. Nicotine is one of the most potent stimulants to acid production in the gastrointestinal tract.  Caffeinated and decaffeinated coffee and black tea.  Regular or low-calorie carbonated beverages or energy drinks (caffeine-free carbonated beverages are allowed).   Strong spices, such as black pepper, white pepper, red pepper, cayenne, curry powder, and chili powder.  Peppermint or spearmint.  Chocolate.  High-fat foods, including meats and fried foods. Extra added fats including oils, butter, salad dressings, and nuts. Limit these to less than 8 tsp per day.  Fruits and vegetables if they are not tolerated, such as citrus fruits or tomatoes.  Alcohol.  Any food that seems to aggravate your condition. If you have questions regarding your diet, call your caregiver or a registered dietitian. OTHER THINGS THAT MAY HELP GERD INCLUDE:   Eating your meals slowly, in a relaxed setting.  Eating 5 to 6 small meals per day instead of 3 large  meals.  Eliminating food for a period of time if it causes distress.  Not lying down until 3 hours after eating a meal.  Keeping the head of your bed raised 6 to 9 inches (15 to 23 cm) by using a foam wedge or blocks under the legs of the bed. Lying flat may make symptoms worse.  Being physically active. Weight loss may be helpful in reducing reflux in overweight or obese adults.  Wear loose fitting clothing EXAMPLE MEAL PLAN This meal plan is approximately 2,000 calories based on https://www.[facility].org/ meal planning guidelines. Breakfast   cup cooked oatmeal.  1 cup strawberries.  1 cup low-fat milk.  1 oz almonds. Snack  1 cup cucumber slices.  6 oz yogurt (made from low-fat or fat-free milk). Lunch  2 slice whole-wheat bread.  2 oz sliced [place].  2 tsp mayonnaise.  1 cup blueberries.  1 cup snap peas. Snack  6 whole-wheat crackers.  1 oz string cheese. Dinner   cup brown rice.  1 cup mixed veggies.  1 tsp olive oil.  3 oz grilled fish. Document Released: 06/28/2005 Document Revised: 09/20/2011 Document Reviewed: 05/14/2011 [facility] Patient Information 2014 [place], [place].

## 2013-09-05 ENCOUNTER — Telehealth: Payer: Self-pay | Admitting: Family Medicine

## 2013-09-05 NOTE — Telephone Encounter (Signed)
Pt is wanting to know if dr. Tawanna Coolerodd can suggest safely how she can wing off the nadolol (CORGARD) 40 MG tablet, and go back to taking it as needed. Pt is currently on 20 mg daily.

## 2013-09-10 NOTE — Telephone Encounter (Signed)
Spoke with patient and she will try half tab every other day per Dr Tawanna Coolerodd and then call back in 2 weeks.

## 2013-09-28 ENCOUNTER — Telehealth: Payer: Self-pay | Admitting: Family Medicine

## 2013-09-28 NOTE — Telephone Encounter (Signed)
Pt is titrating her bp med after two wks and was told to callback and speak to rachel. Pt is aware rachel working with another md today

## 2013-09-28 NOTE — Telephone Encounter (Signed)
Spoke with patient.  She is trying to titrate from Corgard.  For 2 weeks she has been taking 10 mg M,W,F and 20 mg Tu, Thur, Sat.  She is "doing very well".  Please advise

## 2013-10-01 NOTE — Telephone Encounter (Signed)
Per Dr Tawanna Coolerodd 10 mg for 2 weeks, 10 mg M,W, F for a two week taper.  Left message on machine for patient to return our call

## 2013-10-01 NOTE — Telephone Encounter (Signed)
Patient is aware 

## 2013-11-08 ENCOUNTER — Encounter: Payer: Self-pay | Admitting: Family Medicine

## 2013-11-08 ENCOUNTER — Ambulatory Visit (INDEPENDENT_AMBULATORY_CARE_PROVIDER_SITE_OTHER): Payer: BC Managed Care – PPO | Admitting: Family Medicine

## 2013-11-08 VITALS — BP 160/110 | HR 112 | Temp 98.1°F | Wt 162.0 lb

## 2013-11-08 DIAGNOSIS — R002 Palpitations: Secondary | ICD-10-CM

## 2013-11-08 DIAGNOSIS — I498 Other specified cardiac arrhythmias: Secondary | ICD-10-CM

## 2013-11-08 DIAGNOSIS — R000 Tachycardia, unspecified: Secondary | ICD-10-CM

## 2013-11-08 MED ORDER — NADOLOL 40 MG PO TABS: 20.0000 mg | ORAL_TABLET | Freq: Every day | ORAL | Status: DC

## 2013-11-08 NOTE — Progress Notes (Signed)
   Subjective:    Patient ID: Doran ClayAnne K Myren-Mezgar, female    DOB: 10/17/1950, 63 y.o.   MRN: [id number]  HPI In is a 63 year old female nonsmoker who comes in today with rapid heart rate  She has a history of acute thyroiditis and subsequently had her thyroid removed last year. Last TSH level on 225 mcg of Synthroid daily in October 2014 was normal. She states she noticed episodes of rapid heart rate. In the past we had her on a beta blocker because of PVCs. She weaned off of that and has been symptom free. Until recently   Review of Systems Negative no chest pain shortness of breath etc.    Objective:   Physical Exam Well-developed well-nourished female no acute distress vital signs stable she's afebrile except for BP 160/110 cardiac exam normal except for rapid heart rate 110 and regular. EKG shows a sinus tachycardia cardia       Assessment & Plan:

## 2013-11-08 NOTE — Patient Instructions (Signed)
Corgard 40 mg,,,,,,,, 1 now then one tablet daily in the morning followup in 4 weeks

## 2013-11-08 NOTE — Progress Notes (Signed)
Pre visit review using our clinic review tool, if applicable. No additional management support is needed unless otherwise documented below in the visit note. 

## 2013-11-09 LAB — TSH: TSH: 0.1 u[IU]/mL — ABNORMAL LOW (ref 0.35–5.50)

## 2013-11-13 ENCOUNTER — Telehealth: Payer: Self-pay | Admitting: Family Medicine

## 2013-11-13 NOTE — Telephone Encounter (Signed)
Pt returning your call. pls call °

## 2013-11-13 NOTE — Telephone Encounter (Signed)
Left message on machine for patient to return our call 

## 2013-11-14 ENCOUNTER — Other Ambulatory Visit: Payer: Self-pay | Admitting: Family Medicine

## 2013-11-14 DIAGNOSIS — E039 Hypothyroidism, unspecified: Secondary | ICD-10-CM

## 2013-11-14 NOTE — Telephone Encounter (Signed)
Patient is aware of lab result, lab appointment made.

## 2013-12-17 ENCOUNTER — Other Ambulatory Visit (INDEPENDENT_AMBULATORY_CARE_PROVIDER_SITE_OTHER): Payer: BC Managed Care – PPO

## 2013-12-17 DIAGNOSIS — E039 Hypothyroidism, unspecified: Secondary | ICD-10-CM

## 2013-12-17 LAB — TSH: TSH: 6.68 u[IU]/mL — ABNORMAL HIGH (ref 0.35–4.50)

## 2013-12-18 ENCOUNTER — Telehealth: Payer: Self-pay | Admitting: Family Medicine

## 2013-12-19 ENCOUNTER — Other Ambulatory Visit: Payer: Self-pay | Admitting: Family Medicine

## 2013-12-19 DIAGNOSIS — E0590 Thyrotoxicosis, unspecified without thyrotoxic crisis or storm: Secondary | ICD-10-CM

## 2014-03-12 ENCOUNTER — Other Ambulatory Visit (INDEPENDENT_AMBULATORY_CARE_PROVIDER_SITE_OTHER): Payer: BC Managed Care – PPO

## 2014-03-12 DIAGNOSIS — E0590 Thyrotoxicosis, unspecified without thyrotoxic crisis or storm: Secondary | ICD-10-CM

## 2014-03-12 LAB — TSH: TSH: 5.3 u[IU]/mL — ABNORMAL HIGH (ref 0.35–4.50)

## 2014-03-14 ENCOUNTER — Other Ambulatory Visit: Payer: Self-pay | Admitting: *Deleted

## 2014-03-14 MED ORDER — SYNTHROID 150 MCG PO TABS: 150.0000 ug | ORAL_TABLET | Freq: Every day | ORAL | Status: DC

## 2014-03-26 ENCOUNTER — Telehealth: Payer: Self-pay | Admitting: *Deleted

## 2014-03-26 ENCOUNTER — Other Ambulatory Visit: Payer: Self-pay | Admitting: Family Medicine

## 2014-03-26 DIAGNOSIS — E039 Hypothyroidism, unspecified: Secondary | ICD-10-CM

## 2014-03-26 NOTE — Telephone Encounter (Signed)
Per Dr Tawanna Cooler patient should start her beta blocker.  Patient is aware.

## 2014-03-26 NOTE — Telephone Encounter (Signed)
Patient was taking Synthroid 100 mcg Monday- Friday.  Her thyroid levels were to high so she started Synthroid 100 mcg daily.  She has also "weaned" herself off of her beta blocker.  She now has tachycardia about 1 hour after she eat a meal.  Please advise.

## 2014-03-26 NOTE — Telephone Encounter (Signed)
Per  Dr Tawanna Cooler patient should start her beta blocker

## 2014-04-16 ENCOUNTER — Other Ambulatory Visit (INDEPENDENT_AMBULATORY_CARE_PROVIDER_SITE_OTHER): Payer: BC Managed Care – PPO

## 2014-04-16 DIAGNOSIS — E039 Hypothyroidism, unspecified: Secondary | ICD-10-CM

## 2014-04-16 LAB — TSH: TSH: 0.04 u[IU]/mL — AB (ref 0.35–4.50)

## 2014-04-17 ENCOUNTER — Other Ambulatory Visit: Payer: Self-pay | Admitting: Family Medicine

## 2014-04-17 DIAGNOSIS — E0590 Thyrotoxicosis, unspecified without thyrotoxic crisis or storm: Secondary | ICD-10-CM

## 2014-04-17 MED ORDER — LEVOTHYROXINE SODIUM 100 MCG PO TABS: 100.0000 ug | ORAL_TABLET | Freq: Every day | ORAL | Status: DC

## 2014-04-24 ENCOUNTER — Encounter (HOSPITAL_COMMUNITY): Payer: Self-pay | Admitting: Emergency Medicine

## 2014-04-24 ENCOUNTER — Emergency Department (HOSPITAL_COMMUNITY): Payer: BC Managed Care – PPO

## 2014-04-24 ENCOUNTER — Emergency Department (HOSPITAL_COMMUNITY)
Admission: EM | Admit: 2014-04-24 | Discharge: 2014-04-24 | Disposition: A | Discharge status: Home / Self Care | Payer: BC Managed Care – PPO | Attending: Emergency Medicine | Admitting: Emergency Medicine

## 2014-04-24 DIAGNOSIS — R531 Weakness: Secondary | ICD-10-CM | POA: Insufficient documentation

## 2014-04-24 DIAGNOSIS — F419 Anxiety disorder, unspecified: Secondary | ICD-10-CM | POA: Diagnosis not present

## 2014-04-24 DIAGNOSIS — R0789 Other chest pain: Secondary | ICD-10-CM

## 2014-04-24 DIAGNOSIS — F329 Major depressive disorder, single episode, unspecified: Secondary | ICD-10-CM | POA: Diagnosis not present

## 2014-04-24 DIAGNOSIS — R258 Other abnormal involuntary movements: Secondary | ICD-10-CM | POA: Insufficient documentation

## 2014-04-24 DIAGNOSIS — J45909 Unspecified asthma, uncomplicated: Secondary | ICD-10-CM | POA: Diagnosis not present

## 2014-04-24 DIAGNOSIS — R0602 Shortness of breath: Secondary | ICD-10-CM | POA: Insufficient documentation

## 2014-04-24 DIAGNOSIS — Z8719 Personal history of other diseases of the digestive system: Secondary | ICD-10-CM | POA: Insufficient documentation

## 2014-04-24 DIAGNOSIS — Z79899 Other long term (current) drug therapy: Secondary | ICD-10-CM | POA: Insufficient documentation

## 2014-04-24 DIAGNOSIS — M62838 Other muscle spasm: Secondary | ICD-10-CM | POA: Diagnosis present

## 2014-04-24 DIAGNOSIS — N390 Urinary tract infection, site not specified: Secondary | ICD-10-CM | POA: Insufficient documentation

## 2014-04-24 DIAGNOSIS — R55 Syncope and collapse: Secondary | ICD-10-CM | POA: Insufficient documentation

## 2014-04-24 DIAGNOSIS — Z87891 Personal history of nicotine dependence: Secondary | ICD-10-CM | POA: Insufficient documentation

## 2014-04-24 LAB — URINALYSIS, ROUTINE W REFLEX MICROSCOPIC
BILIRUBIN URINE: NEGATIVE
Glucose, UA: NEGATIVE mg/dL
Hgb urine dipstick: NEGATIVE
Ketones, ur: NEGATIVE mg/dL
NITRITE: NEGATIVE
PROTEIN: NEGATIVE mg/dL
SPECIFIC GRAVITY, URINE: 1.017 (ref 1.005–1.030)
UROBILINOGEN UA: 0.2 mg/dL (ref 0.0–1.0)
pH: 5.5 (ref 5.0–8.0)

## 2014-04-24 LAB — I-STAT TROPONIN, ED: TROPONIN I, POC: 0 ng/mL (ref 0.00–0.08)

## 2014-04-24 LAB — BASIC METABOLIC PANEL
Anion gap: 10 (ref 5–15)
BUN: 11 mg/dL (ref 6–23)
CHLORIDE: 104 meq/L (ref 96–112)
CO2: 24 mEq/L (ref 19–32)
Calcium: 8.6 mg/dL (ref 8.4–10.5)
Creatinine, Ser: 0.96 mg/dL (ref 0.50–1.10)
GFR calc non Af Amer: 62 mL/min — ABNORMAL LOW (ref >90)
GFR, EST AFRICAN AMERICAN: 72 mL/min — AB (ref >90)
GLUCOSE: 116 mg/dL — AB (ref 70–99)
POTASSIUM: 4.4 meq/L (ref 3.7–5.3)
SODIUM: 138 meq/L (ref 137–147)

## 2014-04-24 LAB — CBC WITH DIFFERENTIAL/PLATELET
Basophils Absolute: 0 10*3/uL (ref 0.0–0.1)
Basophils Relative: 0 % (ref 0–1)
Eosinophils Absolute: 0.1 10*3/uL (ref 0.0–0.7)
Eosinophils Relative: 3 % (ref 0–5)
HCT: 34.9 % — ABNORMAL LOW (ref 36.0–46.0)
HEMOGLOBIN: 12.2 g/dL (ref 12.0–15.0)
LYMPHS ABS: 1.3 10*3/uL (ref 0.7–4.0)
LYMPHS PCT: 32 % (ref 12–46)
MCH: 32.5 pg (ref 26.0–34.0)
MCHC: 35 g/dL (ref 30.0–36.0)
MCV: 93.1 fL (ref 78.0–100.0)
Monocytes Absolute: 0.4 10*3/uL (ref 0.1–1.0)
Monocytes Relative: 10 % (ref 3–12)
NEUTROS ABS: 2.2 10*3/uL (ref 1.7–7.7)
NEUTROS PCT: 55 % (ref 43–77)
Platelets: 198 10*3/uL (ref 150–400)
RBC: 3.75 MIL/uL — AB (ref 3.87–5.11)
RDW: 11.7 % (ref 11.5–15.5)
WBC: 4 10*3/uL (ref 4.0–10.5)

## 2014-04-24 LAB — URINE MICROSCOPIC-ADD ON

## 2014-04-24 LAB — TSH: TSH: 0.326 u[IU]/mL — AB (ref 0.350–4.500)

## 2014-04-24 MED ORDER — CEPHALEXIN 500 MG PO CAPS: 500.0000 mg | ORAL_CAPSULE | Freq: Three times a day (TID) | ORAL | Status: DC

## 2014-04-24 MED ORDER — SODIUM CHLORIDE 0.9 % IV BOLUS (SEPSIS)
500.0000 mL | Freq: Once | INTRAVENOUS | Status: AC
  Administered 2014-04-24: 500 mL via INTRAVENOUS

## 2014-04-24 MED ORDER — DIAZEPAM 5 MG/ML IJ SOLN
2.5000 mg | Freq: Once | INTRAMUSCULAR | Status: AC
  Administered 2014-04-24: 2.5 mg via INTRAVENOUS
  Filled 2014-04-24: qty 2

## 2014-04-24 MED ORDER — IOHEXOL 350 MG/ML SOLN
100.0000 mL | Freq: Once | INTRAVENOUS | Status: AC | PRN
  Administered 2014-04-24: 100 mL via INTRAVENOUS

## 2014-04-24 NOTE — ED Notes (Signed)
Unable to get patient orthostatic vitals standing I made PA aware

## 2014-04-24 NOTE — ED Notes (Signed)
Per EMS- Patient was at work and had spasms of bilateral shoulders.Patient alert and oriented. PERL. Stroke work up-normal. Patient c/o soreness of her chest that is worse with movement.

## 2014-04-24 NOTE — ED Provider Notes (Signed)
Medical screening examination/treatment/procedure(s) were conducted as a shared visit with non-physician practitioner(s) and myself.  I personally evaluated the patient during the encounter.   EKG Interpretation   Date/Time:  Wednesday April 24 2014 16:18:25 EDT Ventricular Rate:  69 PR Interval:  143 QRS Duration: 89 QT Interval:  414 QTC Calculation: 443 R Axis:   54 Text Interpretation:  Sinus rhythm Nonspecific T abnormalities, anterior  leads Minimal ST elevation, inferior leads Artifact No previous tracing  Confirmed by Zacharee Gaddie  MD-J, Sekai Nayak ([id number]) on 04/24/2014 4:43:16 PM      Pt's symptoms raised the concern of aortic dissection.   On exam she did have significant chest wall ttp. CT scan was normal.  Doubt ACS.  Over one day of pain.  Normal enzymes. DC home with pain meds.  Follow up with PCP  Linwood DibblesJon Jessia Kief, MD 04/24/14 2148

## 2014-04-24 NOTE — ED Notes (Signed)
Bed: [id number] Expected date:  Expected time:  Means of arrival:  Comments: EMS, not feeling good, muscle spasm

## 2014-04-24 NOTE — ED Provider Notes (Signed)
CSN: [id number]     Arrival date & time 04/24/14  1545 History   First MD Initiated Contact with Patient 04/24/14 1552     Chief Complaint  Patient presents with  . Spasms   HPI  Patient is a 63 y.o. Female who presents to the ED with weakness, fatigue, near syncope, chest pain, and muscle spasm.  Patient states that she was at work sitting down and working on her computer when she became really weak and fatigued and got very scared and felt that she was going to pass out.  Patient developed some chest pain which has been going on intermittently since last night. The chest pain is sharp and achey, but today feels more like soreness.  Patient states that it hurts worse with movement and with pressure applied to her chest.  Patient states that she had some mild shortness of breath with the initial feelings of fatigue and weakness.  She denies falling or hitting her head.  She tried lying down after the initial feelings started. Patient feels that this may be related to her recent change in levothyroxine levels which was done approximately a week ago.  Patient is also complaining of bilateral muscle spasms of the shoulders, neck and back.  Patient states that she has several brothers who also have muscles spasms like this.  Patient denies a personal cardiac history.  There is no family hx for sudden cardiac death, MI, or aortic dissection.       Past Medical History  Diagnosis Date  . Anxiety   . GERD (gastroesophageal reflux disease)   . Heartburn   . Seizures     MEDICATION INDUCED  . Family history of anesthesia complication     mother "nausea and vomiting post surger"  . Arrhythmia     "does not see cardiologist, sees pcp, Dr. Alonza SmokerJeff Todd  . Asthma     DX IN MARCH 2012 "allergy induced asthma"  . Bronchitis     hx of  . Urinary tract infection     hx of  . Herpes     on medication for outbreaks only  . Depression     "past hx of for Panic attacks, not on medication at this time"  .  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 Allergy    Past Surgical History  Procedure Laterality Date  . Spinal cystectomy      "where pilonidal cyst was"  . Knee arthroscopy      right  . Cesarean section      x 2  . Pilonidal cyst excision    . Elbow surgery      right  . Colonoscopy    . Thyroidectomy  04/07/2012    Procedure: THYROIDECTOMY;  Surgeon: Currie Parishristian J Streck, MD;  [facility];  Service: General;  Laterality: N/A;      Family History  Problem Relation Age of Onset  . Heart failure Father   . Hypertension Father   . Seizures Father   . Heart disease Father   . Seizures Brother   . Irritable bowel syndrome Brother   . Cirrhosis Brother   . Hypertension Brother     x 4  . Fibromyalgia Sister   . Atrial fibrillation Sister   . Prostate cancer Brother   . Cervical cancer Mother    History  Substance Use Topics  . Smoking status: Former Games developermoker  .  Smokeless tobacco: Never Used  . Alcohol Use: Yes     Comment: occasional   OB History   Grav Para Term Preterm Abortions TAB SAB Ect Mult Living                 Review of Systems  Constitutional: Positive for fatigue. Negative for fever, chills and diaphoresis.  Respiratory: Positive for shortness of breath. Negative for chest tightness.   Cardiovascular: Positive for chest pain. Negative for palpitations and leg swelling.  Gastrointestinal: Negative for nausea, vomiting, abdominal pain, diarrhea, constipation and blood in stool.  Genitourinary: Negative for dysuria, urgency, frequency, hematuria and difficulty urinating.  Musculoskeletal: Negative for gait problem, neck pain and neck stiffness.  Neurological: Positive for dizziness, tremors (Chronic essential tremor), syncope (near syncope), weakness and light-headedness. Negative for seizures, speech difficulty, numbness and headaches.       Spasms  All other  systems reviewed and are negative.     Allergies  Cyclobenzaprine hcl  Home Medications   Prior to Admission medications   Medication Sig Start Date End Date Taking? Authorizing Provider  acetaminophen (TYLENOL) 500 MG tablet Take 1,000 mg by mouth daily as needed for moderate pain (shoulder & neck pain).   Yes Historical Provider, MD  diazepam (VALIUM) 2 MG tablet Take 1 tablet (2 mg total) by mouth 2 (two) times daily. For sleep 04/05/13  Yes Gordy Savers, MD  diphenhydrAMINE (BENADRYL) 25 MG tablet Take 25 mg by mouth at bedtime.    Yes Historical Provider, MD  ibuprofen (ADVIL,MOTRIN) 200 MG tablet Take 600 mg by mouth every 6 (six) hours as needed for moderate pain (chest & neck pain).   Yes Historical Provider, MD  levothyroxine (SYNTHROID, LEVOTHROID) 100 MCG tablet Take 1 tablet (100 mcg total) by mouth daily. 04/17/14  Yes Roderick Pee, MD  nadolol (CORGARD) 40 MG tablet Take 0.5 tablets (20 mg total) by mouth daily. 11/08/13  Yes Roderick Pee, MD   BP 156/89  Pulse 66  Temp(Src) 98.2 F (36.8 C) (Oral)  Resp 20  SpO2 98% Physical Exam  Nursing note and vitals reviewed. Constitutional: She is oriented to person, place, and time. She appears well-developed and well-nourished.  HENT:  Head: Normocephalic and atraumatic.  Mouth/Throat: Oropharynx is clear and moist. No oropharyngeal exudate.  Eyes: Conjunctivae and EOM are normal. Pupils are equal, round, and reactive to light. No scleral icterus.  Neck: Normal range of motion. Neck supple. No JVD present. No thyromegaly present.  Cardiovascular: Normal rate, regular rhythm, normal heart sounds and intact distal pulses.  Exam reveals no gallop and no friction rub.   No murmur heard. Pulmonary/Chest: Effort normal and breath sounds normal. No respiratory distress. She has no wheezes. She has no rales. She exhibits tenderness.  Abdominal: Soft. Bowel sounds are normal. She exhibits no distension and no mass. There is  no tenderness. There is no rebound and no guarding.  Musculoskeletal: Normal range of motion.  Lymphadenopathy:    She has no cervical adenopathy.  Neurological: She is alert and oriented to person, place, and time. She has normal strength. No cranial nerve deficit or sensory deficit. Coordination and gait normal.  Skin: Skin is warm and dry.  Psychiatric: She has a normal mood and affect. Her behavior is normal. Judgment and thought content normal.    ED Course  Procedures (including critical care time) Labs Review Labs Reviewed  CBC WITH DIFFERENTIAL - Abnormal; Notable for the following:    RBC 3.75 (*)  HCT 34.9 (*)    All other components within normal limits  BASIC METABOLIC PANEL - Abnormal; Notable for the following:    Glucose, Bld 116 (*)    GFR calc non Af Amer 62 (*)    GFR calc Af Amer 72 (*)    All other components within normal limits  TSH - Abnormal; Notable for the following:    TSH 0.326 (*)    All other components within normal limits  URINALYSIS, ROUTINE W REFLEX MICROSCOPIC - Abnormal; Notable for the following:    APPearance CLOUDY (*)    Leukocytes, UA LARGE (*)    All other components within normal limits  URINE MICROSCOPIC-ADD ON - Abnormal; Notable for the following:    Squamous Epithelial / LPF FEW (*)    All other components within normal limits  I-STAT TROPOININ, ED    Imaging Review Dg Chest 2 View  04/24/2014   CLINICAL DATA:  Muscle spasms in the chest.  EXAM: CHEST  2 VIEW  COMPARISON:  06/10/2012 chest CT.  FINDINGS: Cardiopericardial silhouette within normal limits. Mediastinal contours normal. Trachea midline. No airspace disease or effusion. Monitoring leads project over the chest.  IMPRESSION: No active cardiopulmonary disease.   Electronically Signed   By: Andreas NewportGeoffrey  Lamke M.D.   On: 04/24/2014 16:48   Ct Angio Chest Aorta W/cm &/or Wo/cm  04/24/2014   CLINICAL DATA:  63 year old female with bilateral shoulder spasm. Evaluation for  dissection  EXAM: CT ANGIOGRAPHY CHEST WITH CONTRAST  TECHNIQUE: Multidetector CT imaging of the chest was performed using the standard protocol during bolus administration of intravenous contrast. Multiplanar CT image reconstructions and MIPs were obtained to evaluate the vascular anatomy.  CONTRAST:  100mL OMNIPAQUE IOHEXOL 350 MG/ML SOLN  COMPARISON:  Chest x-ray 04/24/2014.  CT chest 06/10/2012.  FINDINGS: Chest:  Superficial soft tissues of the chest wall unremarkable.  Surgical changes of thyroidectomy with clips in the thyroid bed. No supraclavicular adenopathy.  Small lymph nodes in the bilateral axillary region, none of which are enlarged by CT size criteria.  Central airways are patent.  No mediastinal adenopathy.  Unremarkable appearance of the length of the esophagus.  Heart size within normal limits.  No pericardial fluid/ thickening.  Diameter of the ascending aorta measures 3 cm, within normal limits. No dissection flap identified. No periaortic fluid or aneurysm identified. Descending thoracic aorta unremarkable. Three-vessel arch with no significant atherosclerotic changes.  No central, lobar, segmental, or subsegmental filling defects. Diameter of the main pulmonary artery measures 2.4 cm.  No confluent airspace disease, pneumothorax, pleural effusion. No mass identified.  No displaced fracture identified. Accentuated thoracic kyphotic curvature. Vertebral body heights maintained.  Review of the MIP images confirms the above findings.  IMPRESSION: No acute arterial abnormality.  Study is negative for pulmonary emboli.  Signed,  Yvone NeuJaime S. Loreta AveWagner, DO  [facility]  [facility]   Electronically Signed   By: Gilmer MorJaime  Wagner D.O.   On: 04/24/2014 20:44     EKG Interpretation   Date/Time:  Wednesday April 24 2014 16:18:25 EDT Ventricular Rate:  69 PR Interval:  143 QRS Duration: 89 QT Interval:  414 QTC Calculation: 443 R Axis:   54 Text  Interpretation:  Sinus rhythm Nonspecific T abnormalities, anterior  leads Minimal ST elevation, inferior leads Artifact No previous tracing  Confirmed by KNAPP  MD-J, JON ([id number]) on 04/24/2014 4:43:16 PM      MDM   Final diagnoses:  Near syncope  UTI (lower  urinary tract infection)  Chest wall pain   Patient is a 63 y.o. Female who presents to the ED with chest pain, weakness, and near syncope.  Physical exam reveals fatigued appearing female in NAD.   There is tenderness to palpation of the chest wall.  There are no focal neurological deficits.  EKG shows non-specific T wave changes.  CXR is negative for acute abnormalities.  CBC shows no leukocytosis or anemia.  BMP unremarkable.  TSH requested by patient improving.  UA shows UTI.  Given radiation of symptoms to the back CTA chest was ordered to rule out dissection.  CTA of chest is negative for dissection and PE.  Suspect that the symptoms the patient experienced are likely from UTI.  Patient's chest pain is chest wall in nature.  Patient is stable for discharge home at this time.   I will discharge the patient home with Keflex TID x 1 week.  Patient to follow-up with her PCP.  Patient states understanding and agreement.  Patient was also seen by Dr. Lynelle Doctor who agrees with the above workup and plan.      Eben Burow, PA-C 04/24/14 2128

## 2014-04-24 NOTE — Discharge Instructions (Signed)
Urinary Tract Infection °Urinary tract infections (UTIs) can develop anywhere along your urinary tract. Your urinary tract is your body's drainage system for removing wastes and extra water. Your urinary tract includes two kidneys, two ureters, a bladder, and a urethra. Your kidneys are a pair of bean-shaped organs. Each kidney is about the size of your fist. They are located below your ribs, one on each side of your spine. °CAUSES °Infections are caused by microbes, which are microscopic organisms, including fungi, viruses, and bacteria. These organisms are so small that they can only be seen through a microscope. Bacteria are the microbes that most commonly cause UTIs. °SYMPTOMS  °Symptoms of UTIs may vary by age and gender of the patient and by the location of the infection. Symptoms in young women typically include a frequent and intense urge to urinate and a painful, burning feeling in the bladder or urethra during urination. Older women and men are more likely to be tired, shaky, and weak and have muscle aches and abdominal pain. A fever may mean the infection is in your kidneys. Other symptoms of a kidney infection include pain in your back or sides below the ribs, nausea, and vomiting. °DIAGNOSIS °To diagnose a UTI, your caregiver will ask you about your symptoms. Your caregiver also will ask to provide a urine sample. The urine sample will be tested for bacteria and white blood cells. White blood cells are made by your body to help fight infection. °TREATMENT  °Typically, UTIs can be treated with medication. Because most UTIs are caused by a bacterial infection, they usually can be treated with the use of antibiotics. The choice of antibiotic and length of treatment depend on your symptoms and the type of bacteria causing your infection. °HOME CARE INSTRUCTIONS °· If you were prescribed antibiotics, take them exactly as your caregiver instructs you. Finish the medication even if you feel better after you  have only taken some of the medication. °· Drink enough water and fluids to keep your urine clear or pale yellow. °· Avoid caffeine, tea, and carbonated beverages. They tend to irritate your bladder. °· Empty your bladder often. Avoid holding urine for long periods of time. °· Empty your bladder before and after sexual intercourse. °· After a bowel movement, women should cleanse from front to back. Use each tissue only once. °SEEK MEDICAL CARE IF:  °· You have back pain. °· You develop a fever. °· Your symptoms do not begin to resolve within 3 days. °SEEK IMMEDIATE MEDICAL CARE IF:  °· You have severe back pain or lower abdominal pain. °· You develop chills. °· You have nausea or vomiting. °· You have continued burning or discomfort with urination. °MAKE SURE YOU:  °· Understand these instructions. °· Will watch your condition. °· Will get help right away if you are not doing well or get worse. °Document Released: 04/07/2005 Document Revised: 12/28/2011 Document Reviewed: 08/06/2011 °ExitCare® Patient Information ©2015 [employer]. This information is not intended to replace advice given to you by your health care provider. Make sure you discuss any questions you have with your health care provider. °Near-Syncope °Near-syncope (commonly known as near fainting) is sudden weakness, dizziness, or feeling like you might pass out. During an episode of near-syncope, you may also develop pale skin, have tunnel vision, or feel sick to your stomach (nauseous). Near-syncope may occur when getting up after sitting or while standing for a long time. It is caused by a sudden decrease in blood flow to the brain.   This decrease can result from various causes or triggers, most of which are not serious. However, because near-syncope can sometimes be a sign of something serious, a medical evaluation is required. The specific cause is often not determined. °HOME CARE INSTRUCTIONS  °Monitor your condition for any changes. The  following actions may help to alleviate any discomfort you are experiencing: °· Have someone stay with you until you feel stable. °· Lie down right away and prop your feet up if you start feeling like you might faint. Breathe deeply and steadily. Wait until all the symptoms have passed. Most of these episodes last only a few minutes. You may feel tired for several hours.   °· Drink enough fluids to keep your urine clear or pale yellow.   °· If you are taking blood pressure or heart medicine, get up slowly when seated or lying down. Take several minutes to sit and then stand. This can reduce dizziness. °· Follow up with your health care provider as directed.  °SEEK IMMEDIATE MEDICAL CARE IF:  °· You have a severe headache.   °· You have unusual pain in the chest, abdomen, or back.   °· You are bleeding from the mouth or rectum, or you have black or tarry stool.   °· You have an irregular or very fast heartbeat.   °· You have repeated fainting or have seizure-like jerking during an episode.   °· You faint when sitting or lying down.   °· You have confusion.   °· You have difficulty walking.   °· You have severe weakness.   °· You have vision problems.   °MAKE SURE YOU:  °· Understand these instructions. °· Will watch your condition. °· Will get help right away if you are not doing well or get worse. °Document Released: 06/28/2005 Document Revised: 07/03/2013 Document Reviewed: 12/01/2012 °ExitCare® Patient Information ©2015 [employer]. This information is not intended to replace advice given to you by your health care provider. Make sure you discuss any questions you have with your health care provider. ° °

## 2014-04-26 ENCOUNTER — Emergency Department (HOSPITAL_COMMUNITY): Payer: BC Managed Care – PPO

## 2014-04-26 ENCOUNTER — Encounter (HOSPITAL_COMMUNITY): Payer: Self-pay | Admitting: Emergency Medicine

## 2014-04-26 ENCOUNTER — Emergency Department (HOSPITAL_COMMUNITY)
Admission: EM | Admit: 2014-04-26 | Discharge: 2014-04-26 | Disposition: A | Discharge status: Home / Self Care | Payer: BC Managed Care – PPO | Attending: Emergency Medicine | Admitting: Emergency Medicine

## 2014-04-26 ENCOUNTER — Telehealth: Payer: Self-pay | Admitting: Family Medicine

## 2014-04-26 DIAGNOSIS — J45909 Unspecified asthma, uncomplicated: Secondary | ICD-10-CM | POA: Insufficient documentation

## 2014-04-26 DIAGNOSIS — F329 Major depressive disorder, single episode, unspecified: Secondary | ICD-10-CM | POA: Insufficient documentation

## 2014-04-26 DIAGNOSIS — R253 Fasciculation: Secondary | ICD-10-CM | POA: Diagnosis not present

## 2014-04-26 DIAGNOSIS — R569 Unspecified convulsions: Secondary | ICD-10-CM

## 2014-04-26 DIAGNOSIS — Z87440 Personal history of urinary (tract) infections: Secondary | ICD-10-CM | POA: Diagnosis not present

## 2014-04-26 DIAGNOSIS — Z8719 Personal history of other diseases of the digestive system: Secondary | ICD-10-CM | POA: Insufficient documentation

## 2014-04-26 DIAGNOSIS — F419 Anxiety disorder, unspecified: Secondary | ICD-10-CM | POA: Diagnosis not present

## 2014-04-26 DIAGNOSIS — G40909 Epilepsy, unspecified, not intractable, without status epilepticus: Secondary | ICD-10-CM | POA: Insufficient documentation

## 2014-04-26 DIAGNOSIS — E049 Nontoxic goiter, unspecified: Secondary | ICD-10-CM | POA: Insufficient documentation

## 2014-04-26 DIAGNOSIS — Z8619 Personal history of other infectious and parasitic diseases: Secondary | ICD-10-CM | POA: Insufficient documentation

## 2014-04-26 DIAGNOSIS — Z87891 Personal history of nicotine dependence: Secondary | ICD-10-CM | POA: Insufficient documentation

## 2014-04-26 DIAGNOSIS — R531 Weakness: Secondary | ICD-10-CM | POA: Diagnosis present

## 2014-04-26 DIAGNOSIS — Z792 Long term (current) use of antibiotics: Secondary | ICD-10-CM | POA: Diagnosis not present

## 2014-04-26 DIAGNOSIS — Z79899 Other long term (current) drug therapy: Secondary | ICD-10-CM | POA: Diagnosis not present

## 2014-04-26 LAB — COMPREHENSIVE METABOLIC PANEL
ALBUMIN: 3.9 g/dL (ref 3.5–5.2)
ALT: 23 U/L (ref 0–35)
AST: 17 U/L (ref 0–37)
Alkaline Phosphatase: 63 U/L (ref 39–117)
Anion gap: 13 (ref 5–15)
BUN: 10 mg/dL (ref 6–23)
CALCIUM: 8.9 mg/dL (ref 8.4–10.5)
CO2: 25 meq/L (ref 19–32)
CREATININE: 1 mg/dL (ref 0.50–1.10)
Chloride: 103 mEq/L (ref 96–112)
GFR calc Af Amer: 68 mL/min — ABNORMAL LOW (ref >90)
GFR, EST NON AFRICAN AMERICAN: 59 mL/min — AB (ref >90)
Glucose, Bld: 96 mg/dL (ref 70–99)
Potassium: 4.6 mEq/L (ref 3.7–5.3)
SODIUM: 141 meq/L (ref 137–147)
TOTAL PROTEIN: 7.3 g/dL (ref 6.0–8.3)
Total Bilirubin: 0.4 mg/dL (ref 0.3–1.2)

## 2014-04-26 LAB — LIPASE, BLOOD: Lipase: 27 U/L (ref 11–59)

## 2014-04-26 LAB — URINALYSIS, ROUTINE W REFLEX MICROSCOPIC
Bilirubin Urine: NEGATIVE
Glucose, UA: NEGATIVE mg/dL
Hgb urine dipstick: NEGATIVE
Ketones, ur: NEGATIVE mg/dL
Nitrite: NEGATIVE
PH: 6.5 (ref 5.0–8.0)
PROTEIN: NEGATIVE mg/dL
Specific Gravity, Urine: 1.007 (ref 1.005–1.030)
Urobilinogen, UA: 0.2 mg/dL (ref 0.0–1.0)

## 2014-04-26 LAB — URINE MICROSCOPIC-ADD ON

## 2014-04-26 LAB — CBC
HCT: 36.4 % (ref 36.0–46.0)
Hemoglobin: 13.1 g/dL (ref 12.0–15.0)
MCH: 32.5 pg (ref 26.0–34.0)
MCHC: 36 g/dL (ref 30.0–36.0)
MCV: 90.3 fL (ref 78.0–100.0)
PLATELETS: 217 10*3/uL (ref 150–400)
RBC: 4.03 MIL/uL (ref 3.87–5.11)
RDW: 11.4 % — AB (ref 11.5–15.5)
WBC: 3.2 10*3/uL — ABNORMAL LOW (ref 4.0–10.5)

## 2014-04-26 LAB — MAGNESIUM: Magnesium: 1.9 mg/dL (ref 1.5–2.5)

## 2014-04-26 LAB — I-STAT TROPONIN, ED: Troponin i, poc: 0 ng/mL (ref 0.00–0.08)

## 2014-04-26 LAB — CBG MONITORING, ED: GLUCOSE-CAPILLARY: 74 mg/dL (ref 70–99)

## 2014-04-26 LAB — URINE CULTURE: Special Requests: NORMAL

## 2014-04-26 MED ORDER — DIAZEPAM 5 MG PO TABS: 5.0000 mg | ORAL_TABLET | Freq: Four times a day (QID) | ORAL | Status: DC | PRN

## 2014-04-26 NOTE — Telephone Encounter (Signed)
Patient should go to ER per Dr Fabian SharpPanosh.  Patient is aware.

## 2014-04-26 NOTE — Telephone Encounter (Signed)
Fleet ContrasRachel talked to pt, told to go to ED.

## 2014-04-26 NOTE — Discharge Instructions (Signed)

## 2014-04-26 NOTE — Telephone Encounter (Signed)
Pt's daughter states pt would like for Fleet ContrasRachel to call her back.

## 2014-04-26 NOTE — ED Provider Notes (Signed)
CSN: [id number]     Arrival date & time 04/26/14  1224 History   First MD Initiated Contact with Patient 04/26/14 1242     Chief Complaint  Patient presents with  . Muscles twitching     (Consider location/radiation/quality/duration/timing/severity/associated sxs/prior Treatment) Patient is a 63 y.o. female presenting with weakness. The history is provided by the patient.  Weakness This is a recurrent problem. The current episode started 1 to 2 hours ago. The problem occurs constantly. The problem has not changed since onset.Pertinent negatives include no abdominal pain and no shortness of breath. Nothing aggravates the symptoms. Nothing relieves the symptoms. She has tried nothing for the symptoms. The treatment provided no relief.    Past Medical History  Diagnosis Date  . Anxiety   . GERD (gastroesophageal reflux disease)   . Heartburn   . Seizures     MEDICATION INDUCED  . Family history of anesthesia complication     mother "nausea and vomiting post surger"  . Arrhythmia     "does not see cardiologist, sees pcp, Dr. Alonza SmokerJeff Todd  . Asthma     DX IN MARCH 2012 "allergy induced asthma"  . Bronchitis     hx of  . Urinary tract infection     hx of  . Herpes     on medication for outbreaks only  . Depression     "past hx of for Panic attacks, not on medication at this time"  . 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 Allergy    Past Surgical History  Procedure Laterality Date  . Spinal cystectomy      "where pilonidal cyst was"  . Knee arthroscopy      right  . Cesarean section      x 2  . Pilonidal cyst excision    . Elbow surgery      right  . Colonoscopy    . Thyroidectomy  04/07/2012    Procedure: THYROIDECTOMY;  Surgeon: Currie Parishristian J Streck, MD;  [facility];  Service: General;  Laterality: N/A;      Family History  Problem Relation Age of Onset  . Heart failure  Father   . Hypertension Father   . Seizures Father   . Heart disease Father   . Seizures Brother   . Irritable bowel syndrome Brother   . Cirrhosis Brother   . Hypertension Brother     x 4  . Fibromyalgia Sister   . Atrial fibrillation Sister   . Prostate cancer Brother   . Cervical cancer Mother    History  Substance Use Topics  . Smoking status: Former Games developermoker  . Smokeless tobacco: Never Used  . Alcohol Use: Yes     Comment: occasional   OB History   Grav Para Term Preterm Abortions TAB SAB Ect Mult Living                 Review of Systems  Constitutional: Negative for fever.  Respiratory: Negative for cough and shortness of breath.   Gastrointestinal: Negative for vomiting and abdominal pain.  Neurological: Positive for weakness.  All other systems reviewed and are negative.     Allergies  Cyclobenzaprine hcl  Home Medications   Prior to Admission medications   Medication Sig Start Date End Date Taking? Authorizing Provider  acetaminophen (TYLENOL) 500 MG tablet Take 1,000 mg by mouth daily as needed for moderate pain (shoulder &  neck pain).   Yes Historical Provider, MD  cephALEXin (KEFLEX) 500 MG capsule Take 1 capsule (500 mg total) by mouth 3 (three) times daily. 04/24/14  Yes Courtney A Forcucci, PA-C  diazepam (VALIUM) 2 MG tablet Take 2 mg by mouth daily as needed (Sleep).   Yes Historical Provider, MD  diphenhydrAMINE (BENADRYL) 25 MG tablet Take 25 mg by mouth at bedtime.    Yes Historical Provider, MD  ibuprofen (ADVIL,MOTRIN) 200 MG tablet Take 600 mg by mouth every 6 (six) hours as needed for moderate pain (chest & neck pain).   Yes Historical Provider, MD  levothyroxine (SYNTHROID, LEVOTHROID) 100 MCG tablet Take 1 tablet (100 mcg total) by mouth daily. 04/17/14  Yes Roderick Pee, MD  nadolol (CORGARD) 40 MG tablet Take 0.5 tablets (20 mg total) by mouth daily. 11/08/13  Yes Roderick Pee, MD   There were no vitals taken for this visit. Physical  Exam  Nursing note and vitals reviewed. Constitutional: She is oriented to person, place, and time. She appears well-developed and well-nourished. No distress.  HENT:  Head: Normocephalic and atraumatic.  Mouth/Throat: Oropharynx is clear and moist.  Eyes: EOM are normal. Pupils are equal, round, and reactive to light.  Neck: Normal range of motion. Neck supple.  Cardiovascular: Normal rate and regular rhythm.  Exam reveals no friction rub.   No murmur heard. Pulmonary/Chest: Effort normal and breath sounds normal. No respiratory distress. She has no wheezes. She has no rales. She exhibits no tenderness.  Abdominal: Soft. She exhibits no distension. There is no tenderness. There is no rebound.  Musculoskeletal: Normal range of motion. She exhibits no edema.  Neurological: She is alert and oriented to person, place, and time. No cranial nerve deficit. She exhibits normal muscle tone. Coordination normal.  Skin: No rash noted. She is not diaphoretic.    ED Course  Procedures (including critical care time) Labs Review Labs Reviewed  CBC  COMPREHENSIVE METABOLIC PANEL  URINALYSIS, ROUTINE W REFLEX MICROSCOPIC  LIPASE, BLOOD  MAGNESIUM  CBG MONITORING, ED  I-STAT TROPOININ, ED    Imaging Review Dg Chest 2 View  04/24/2014   CLINICAL DATA:  Muscle spasms in the chest.  EXAM: CHEST  2 VIEW  COMPARISON:  06/10/2012 chest CT.  FINDINGS: Cardiopericardial silhouette within normal limits. Mediastinal contours normal. Trachea midline. No airspace disease or effusion. Monitoring leads project over the chest.  IMPRESSION: No active cardiopulmonary disease.   Electronically Signed   By: Andreas Newport M.D.   On: 04/24/2014 16:48   Ct Angio Chest Aorta W/cm &/or Wo/cm  04/24/2014   CLINICAL DATA:  63 year old female with bilateral shoulder spasm. Evaluation for dissection  EXAM: CT ANGIOGRAPHY CHEST WITH CONTRAST  TECHNIQUE: Multidetector CT imaging of the chest was performed using the standard  protocol during bolus administration of intravenous contrast. Multiplanar CT image reconstructions and MIPs were obtained to evaluate the vascular anatomy.  CONTRAST:  OMNIPAQUE IOHEXOL 350 MG/ML SOLN  COMPARISON:  Chest x-ray 04/24/2014.  CT chest 06/10/2012.  FINDINGS: Chest:  Superficial soft tissues of the chest wall unremarkable.  Surgical changes of thyroidectomy with clips in the thyroid bed. No supraclavicular adenopathy.  Small lymph nodes in the bilateral axillary region, none of which are enlarged by CT size criteria.  Central airways are patent.  No mediastinal adenopathy.  Unremarkable appearance of the length of the esophagus.  Heart size within normal limits.  No pericardial fluid/ thickening.  Diameter of the ascending aorta measures 3  cm, within normal limits. No dissection flap identified. No periaortic fluid or aneurysm identified. Descending thoracic aorta unremarkable. Three-vessel arch with no significant atherosclerotic changes.  No central, lobar, segmental, or subsegmental filling defects. Diameter of the main pulmonary artery measures 2.4 cm.  No confluent airspace disease, pneumothorax, pleural effusion. No mass identified.  No displaced fracture identified. Accentuated thoracic kyphotic curvature. Vertebral body heights maintained.  Review of the MIP images confirms the above findings.  IMPRESSION: No acute arterial abnormality.  Study is negative for pulmonary emboli.  Signed,  Yvone NeuJaime S. Loreta AveWagner, DO  [facility]  [facility]   Electronically Signed   By: Gilmer MorJaime  Wagner D.O.   On: 04/24/2014 20:44     EKG Interpretation   Date/Time:  Friday April 26 2014 12:42:13 EDT Ventricular Rate:  65 PR Interval:  145 QRS Duration: 87 QT Interval:  427 QTC Calculation: 444 R Axis:   56 Text Interpretation:  Sinus rhythm Abnormal R-wave progression, early  transition Borderline T abnormalities, anterior leads Similar to prior   Confirmed by Gwendolyn GrantWALDEN  MD, Valisa Karpel ([id number]) on 04/26/2014 4:26:42 PM      MDM   Final diagnoses:  Muscle twitching    89F presents with weakness with collapse and muscle twitching. Also having chest pain, left sided. No new medicines. Hx of thyroid disease, PCP has been titrating her thyroid medications down as her TSH bottomed out about 1 month ago. TSH 2 days ago 0.326, improving from 1 month ago. During the exam, she had several episodes lasting 1-2 seconds a piece of full body muscle spasms. She regularly takes valium to help with sleep, denies any hx of muscle spasms prior to 2 days ago. Today was at home, had full body episode of weakness with collapse, no syncope. No seizure like activitiy. Workup unremarkable. I offered admission for recurrent pre-syncope, however she refused. Given Neuro f/u and instructed to f/u with PCP.   Elwin MochaBlair Purvis Sidle, MD 04/26/14 [phone]

## 2014-04-26 NOTE — ED Notes (Signed)
Pt coming from home with c/o uncontrolled muscle twitching and jerking movements, was seen here for same on Wednesday and diagnosed with UTI, this morning she started having symptoms again. Pt and daughter sts pt's thyroid medications have been changed recently. Pt called her PCP today and was told by his nurse to come to ED and that she probably needs MRI. Pt sts she feels "slow" and needs time to process things. Pt is A+Ox4, has delayed responses.Every few minutes pt has jerking body movements, twitching like and gets lethargic after, but is easy to arouse.

## 2014-04-26 NOTE — Addendum Note (Signed)
Addended by: Kern ReapVEREEN, RACHEL B on: 04/26/2014 04:26 PM   Modules accepted: Orders

## 2014-04-26 NOTE — Telephone Encounter (Signed)
Patient Information:  Caller Name: [name]  Phone: [phone]  Patient: Colleen [name], Colleen [name]  Gender: Female  DOB: 07/08/1951  Age: 63 Years  PCP: Kelle Dartingodd, [facility](Family Practice)  Office Follow Up:  Does the office need to follow up with this patient?: Yes  Instructions For The Office: Please review - contact patient at [phone] with direction for care.  RN Note:  Someone was with her during her seizure but she is currently alone. Spoke with office - sending note and office will contact patient at  [phone] with directions for care.  Symptoms Hx of falling on top of head 2 weeks ago.  Reason For Call & Symptoms: Hx of first seizure at work on Wednesday 10/14 but did not loose consciousness, was evaluated in ER. About 1 hour ago (about 10 am), felt exhausted, head fell back and she became limp - she deschribes this as a seizue and says she did not loose consciousness..  Had headache 5/10 for short time then went away.  Now feels slowly coming out of seizure, speaking slowly but having muscle contractions right side of chest, right arm, right leg, right side of face.  Has felt sleepy since seizure  Reviewed Health History In EMR: Yes  Reviewed Medications In EMR: Yes  Reviewed Allergies In EMR: Yes  Reviewed Surgeries / Procedures: Yes  Date of Onset of Symptoms: 04/24/2014  Guideline(s) Used:  Seizure  Disposition Per Guideline:   Go to ED Now (or to Office with PCP Approval)  Reason For Disposition Reached:   Wants to sleep after the seizure and persists much longer than usual  Advice Given:  N/A  Patient Will Follow Care Advice:  YES

## 2014-04-26 NOTE — Telephone Encounter (Signed)
Spoke with patient.  She went to the ER and is requesting a referral for neurology. referral placed.

## 2014-04-29 ENCOUNTER — Ambulatory Visit (INDEPENDENT_AMBULATORY_CARE_PROVIDER_SITE_OTHER): Payer: BC Managed Care – PPO | Admitting: Family Medicine

## 2014-04-29 ENCOUNTER — Encounter: Payer: Self-pay | Admitting: Family Medicine

## 2014-04-29 VITALS — BP 148/100 | Temp 98.4°F | Wt 157.0 lb

## 2014-04-29 DIAGNOSIS — R55 Syncope and collapse: Secondary | ICD-10-CM

## 2014-04-29 DIAGNOSIS — R030 Elevated blood-pressure reading, without diagnosis of hypertension: Secondary | ICD-10-CM

## 2014-04-29 NOTE — Progress Notes (Signed)
   Subjective:    Patient ID: Doran ClayAnne K Diers-Mezgar, female    DOB: 08/05/1950, 63 y.o.   MRN: [id number]  HPI [name] is a 63 year old married female nonsmoker who comes in today for evaluation having been seen in the emergency room for a syncopal type episode  She said it started last Wednesday she was work just finished lunch and she felt the overwhelming sense of fatigue. Then she felt lightheaded. They laid her on the ground and put her feet up and year she did not pass out. They called 911 and took her to the hospital. At the hospital her vital signs were stable except her blood pressure was slightly elevated. Laboratory studies and neurologic exam and CT brain scan were all normal. She was sent home. The rest week she felt fairly normal Saturday this past weekend she felt really well and then yesterday she had 2 more episodes. Spell yesterday started in the morning she states she was sitting at the counter eating breakfast and she felt the sense of overwhelming fatigue. States she got up and walked to the living room and laid on the couch for a while and her husband took her to bed and she laid in bed for couple hours. She says she fell out of it. She had no neurologic symptoms except for some twitching of her muscles symmetrical. She says during both episodes she could hear everything is  No history of head trauma or seizure disorder in the past.   Review of Systems Neurologic review of systems otherwise negative    Objective:   Physical Exam Well-developed well-nourished female no acute distress vital signs stable she is afebrile except BP right arm sitting position 170/100. She's had a history of elevated blood pressures in the office in the past. Blood pressures at home are all normal.  She is a well-developed well-nourished female no acute distress vital signs as above  HEENT negative. Neurologic exam she is oriented x3 cranial nerves II through XII are intact and symmetrical sensation  strength reflexes all within normal limits gait normal.       Assessment & Plan:  Presyncopal episodes????? Versus seizure disorder 6,,,,,,, rest at home neurologic evaluation ASAP,,,

## 2014-04-29 NOTE — Patient Instructions (Signed)
Purchase a omron pump up digital blood pressure cuff,,,,,,,,, [employer]  Check your blood pressure 3 times daily  Keep a record of all your blood pressure readings  We will get you set up for a neurologic evaluation ASAP  Rest at home did not work until we get all this to get a

## 2014-04-29 NOTE — Progress Notes (Signed)
Pre visit review using our clinic review tool, if applicable. No additional management support is needed unless otherwise documented below in the visit note. 

## 2014-04-30 ENCOUNTER — Encounter: Payer: Self-pay | Admitting: Neurology

## 2014-04-30 ENCOUNTER — Telehealth: Payer: Self-pay | Admitting: Family Medicine

## 2014-04-30 ENCOUNTER — Ambulatory Visit (INDEPENDENT_AMBULATORY_CARE_PROVIDER_SITE_OTHER): Payer: BC Managed Care – PPO | Admitting: Neurology

## 2014-04-30 ENCOUNTER — Other Ambulatory Visit: Payer: Self-pay | Admitting: *Deleted

## 2014-04-30 VITALS — BP 122/82 | HR 64 | Ht 64.5 in | Wt 157.0 lb

## 2014-04-30 DIAGNOSIS — G253 Myoclonus: Secondary | ICD-10-CM

## 2014-04-30 DIAGNOSIS — R404 Transient alteration of awareness: Secondary | ICD-10-CM

## 2014-04-30 NOTE — Progress Notes (Addendum)
NEUROLOGY CONSULTATION NOTE  Colleen [name] MRN: [id number] DOB: 04-Sep-1950  Referring provider: Dr. Tawanna Coolerodd Primary care provider: Dr. Tawanna Coolerodd  Reason for consult:  convulsions  HISTORY OF PRESENT ILLNESS: Colleen [name] is a 63 year old right-handed woman with history of acute thyroiditis status post thyroidectomy, asthma, depression, anxiety, medication-induced seizure, GERD and goiter who presents for convulsions.  Records and images reviewed.  On 04/24/14, she developed diffuse weakness and fatigue with feeling that she was going to pass out.  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  She also had been having intermittent chest pain and mild shortness of breath.  She also reports muscle spasms involving the shoulders, neck and back. The chest pain seemed more likely related to the muscle spasms.  She presented to the ED.  CTA of the chest was negative for PE.  TSH was 0.326.  Troponins were negative.  EKG showed non-specific T wave abnormalities.  UA did indicate UTI.   It was thought that her symptoms were related to UTI.  She was discharged on Keflex.  She returned to the ED on 04/26/14 with recurrent weakness, chest pain and muscle spasms.  During her stay in the ED, she exhibited 1-2 seconds of full-body muscle spasms.  CT of the brain was unremarkable.  She refused admission for pre-syncope workup.   Since 04/24/14, she has had approximately 4 episodes.  She is currently having an episode in the office.  She has a sense of fatigue and slowed cognitive thinking.  She knows what she wants to say but has difficulty getting words out.  She also has jerks of the shoulder and neck.  This time, she reports slight nausea.  She feels lightheaded.  She is fully conscious.  Since Sunday, she has intermittent numbness on the right side of her face and right hand.  The episodes usually start around 9-10 am and last 4 hours.  Afterwards, she has a diffuse aching headache for a few  minutes.  She denies prior episodes of similar spells.  She reports that she had "seizure-like activity" 25 years ago due to cyclobenzaprine.  She has remote history of headaches but nothing recent.  She denies any fevers.  She reports that she had a recent change to her levothyroxine after experiencing palpitations, but her levels have been okay.  Since she was having a spell while in the office, she was able to have a routine EEG performed during her spell.  Several of her muscle jerking was captured without electrographic correlate.  There were some rare sharply contoured alpha waves without clear epileptiform morphology seen in the left temporal lobe which did not correlate with any of her movements, but there were no epileptiform discharges or seizure activity seen.  Essentially the EEG was within normal limits and confirmed that the muscle jerking was non-epileptic.  PAST MEDICAL HISTORY: Past Medical History  Diagnosis Date  . Anxiety   . GERD (gastroesophageal reflux disease)   . Heartburn   . Seizures     MEDICATION INDUCED  . Family history of anesthesia complication     mother "nausea and vomiting post surger"  . Arrhythmia     "does not see cardiologist, sees pcp, Dr. Alonza SmokerJeff Todd  . Asthma     DX IN MARCH 2012 "allergy induced asthma"  . Bronchitis     hx of  . Urinary tract infection     hx of  . Herpes     on medication for outbreaks only  .  Depression     "past hx of for Panic attacks, not on medication at this time"  . 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 Allergy     PAST SURGICAL HISTORY: Past Surgical History  Procedure Laterality Date  . Spinal cystectomy      "where pilonidal cyst was"  . Knee arthroscopy      right  . Cesarean section      x 2  . Pilonidal cyst excision    . Elbow surgery      right  . Colonoscopy    . Thyroidectomy  04/07/2012    Procedure:  THYROIDECTOMY;  Surgeon: Currie Parishristian J Streck, MD;  [facility];  Service: General;  Laterality: N/A;       MEDICATIONS: Current Outpatient Prescriptions on File Prior to Visit  Medication Sig Dispense Refill  . acetaminophen (TYLENOL) 500 MG tablet Take 1,000 mg by mouth daily as needed for moderate pain (shoulder & neck pain).      . cephALEXin (KEFLEX) 500 MG capsule Take 1 capsule (500 mg total) by mouth 3 (three) times daily.  21 capsule  0  . diazepam (VALIUM) 2 MG tablet Take 2 mg by mouth daily as needed (Sleep).      . diazepam (VALIUM) 5 MG tablet Take 1 tablet (5 mg total) by mouth every 6 (six) hours as needed for anxiety (spasms).  10 tablet  0  . diphenhydrAMINE (BENADRYL) 25 MG tablet Take 25 mg by mouth at bedtime.       Marland Kitchen. ibuprofen (ADVIL,MOTRIN) 200 MG tablet Take 600 mg by mouth every 6 (six) hours as needed for moderate pain (chest & neck pain).      Marland Kitchen. levothyroxine (SYNTHROID, LEVOTHROID) 100 MCG tablet Take 1 tablet (100 mcg total) by mouth daily.  90 tablet  3  . nadolol (CORGARD) 40 MG tablet Take 0.5 tablets (20 mg total) by mouth daily.  90 tablet  3   No current facility-administered medications on file prior to visit.    ALLERGIES: Allergies  Allergen Reactions  . Cyclobenzaprine Hcl     REACTION: seizure    FAMILY HISTORY: Family History  Problem Relation Age of Onset  . Heart failure Father   . Hypertension Father   . Seizures Father   . Heart disease Father   . Seizures Brother   . Irritable bowel syndrome Brother   . Cirrhosis Brother   . Hypertension Brother     x 4  . Fibromyalgia Sister   . Atrial fibrillation Sister   . Prostate cancer Brother   . Cervical cancer Mother     SOCIAL HISTORY: History   Social History  . Marital Status: Married    Spouse Name: N/A    Number of Children: 2  . Years of Education: N/A   Occupational History  . [employer]    Social History Main Topics  . Smoking status: Former  Smoker    Quit date: 05/01/1999  . Smokeless tobacco: Never Used  . Alcohol Use: No  . Drug Use: No  . Sexual Activity: Not on file   Other Topics Concern  . Not on file   Social History Narrative  . No narrative on file    REVIEW OF SYSTEMS: Constitutional: No fevers, chills, or sweats, no generalized fatigue, change in appetite Eyes: No visual changes, double vision, eye pain Ear, nose and throat:  No hearing loss, ear pain, nasal congestion, sore throat Cardiovascular: Chest discomfort Respiratory:  No shortness of breath at rest or with exertion, wheezes GastrointestinaI: No nausea, vomiting, diarrhea, abdominal pain, fecal incontinence Genitourinary:  No dysuria, urinary retention or frequency Musculoskeletal:  No neck pain, back pain Integumentary: No rash, pruritus, skin lesions Neurological: as above Psychiatric: No depression, insomnia, anxiety Endocrine: No palpitations, fatigue, diaphoresis, mood swings, change in appetite, change in weight, increased thirst Hematologic/Lymphatic:  No anemia, purpura, petechiae. Allergic/Immunologic: no itchy/runny eyes, nasal congestion, recent allergic reactions, rashes  PHYSICAL EXAM: Filed Vitals:   04/30/14 1007  BP: 122/82  Pulse: 64   General: No acute distress Head:  Normocephalic/atraumatic Neck: supple, no paraspinal tenderness, full range of motion Back: No paraspinal tenderness Heart: regular rate and rhythm Lungs: Clear to auscultation bilaterally. Vascular: No carotid bruits. Neurological Exam: Mental status: alert and oriented to person, place, and time, recent and remote memory intact, fund of knowledge intact, attention and concentration intact, speech fluent and not dysarthric, language intact. Cranial nerves: CN I: not tested CN II: pupils equal, round and reactive to light, visual fields intact, fundi unremarkable, without vessel changes, exudates, hemorrhages or papilledema. CN III, IV, VI:  full range of  motion, no nystagmus, no ptosis CN V: facial sensation to touch the same, although she endorses tingling in the right side of her face. CN VII: upper and lower face symmetric CN VIII: hearing intact CN IX, X: gag intact, uvula midline CN XI: sternocleidomastoid and trapezius muscles intact CN XII: tongue midline Bulk & Tone: normal, no fasciculations. Motor: 5/5 throughout Sensation: pinprick and vibration sensation intact Deep Tendon Reflexes: 2+ throughout, toes downgoing. Finger to nose testing: no dysmetria or tremor Heel to shin: no dysmetria Gait: normal station and stride with mild occasional jerk of the head and neck.  Able to turn and walk in tandem. Romberg negative.  IMPRESSION: Transient altered awareness associated with myoclonic jerks.  Etiology unknown.  These are not seizures, as demonstrated on EEG.  They really don't seem like stroke symptoms to me.  They do not seem like migraine.  This also clearly isn't just a movement disorder given the accompanying altered awareness.  A psychogenic etiology is possible.  PLAN: 1.  Will get MRI of the brain with and without contrast to evaluate for a structural cause 2.  I would continue benzodiazepine to treat the muscle jerks. 3.  Follow up after MRI  Thank you for allowing me to take part in the care of this patient.  Shon Millet, DO  CC:  Kelle Darting, MD

## 2014-04-30 NOTE — Telephone Encounter (Signed)
Patient has been taking the diazepam 5 mg every 6 hours and it seems to help.  Should she continue?  She would like to go back to work.

## 2014-04-30 NOTE — Patient Instructions (Addendum)
At this point, I don't know what these spells are.  There was no seizure activity associated with them on the EEG.  At this point, I would like to get an MRI of the brain with and without contrast to look for anything structural in the brain which may be contributing to these symptoms.  In the meantime, I would continue Valium for the spasms.  Follow up soon after MRI. 05/09/14 [address]  @6 :15 pm  Take 2  2mg  valium after arriving at the facility  Telephone number [phone]

## 2014-04-30 NOTE — Telephone Encounter (Signed)
Please call pt to discuss EEG preformed by neurologist.No seizure activity was noted.  Cannot get in for an MRI until 11/29. Should she start an anti anxiety med in the meantime?

## 2014-05-01 NOTE — Telephone Encounter (Signed)
Patient states that she is allergic to flexeril.  She says is taking Diazepam only at bedtime and will continue to do so.

## 2014-05-01 NOTE — Telephone Encounter (Signed)
Left message on machine for patient to return our call.  Per Dr Tawanna Coolerodd no need to change her beta blocker at this time.  Also Dr Tawanna Coolerodd does not usually prescribe Diazepam.  If she would like she could try flexeril 5 mg twice daily.  Okay to send in with 4 refills.

## 2014-05-01 NOTE — Telephone Encounter (Signed)
Pt would like to know if she should make a change to the beta blocker.  Pt states this might be a heart related issue.  Pt states she can come in any time today/ tomorrow if she needs to.

## 2014-05-02 ENCOUNTER — Ambulatory Visit: Payer: BC Managed Care – PPO | Admitting: Family Medicine

## 2014-05-02 NOTE — Telephone Encounter (Signed)
Dr Todd notified. 

## 2014-05-02 NOTE — Procedures (Signed)
ELECTROENCEPHALOGRAM REPORT    Date of Study: 04/30/2014  Patient's Name: Colleen [name] MRN: [id number] Date of Birth: 1951/06/14  Indication: Sudden onset of altered sensorium and bilateral muscle jerking  Medications: Keflex Valium levothyroxine  Technical Summary: This is a multichannel digital EEG recording, using the international 10-20 placement system.  Spike detection software was employed.  Description: The EEG background is symmetric, with a well-developed posterior dominant rhythm of 10Hz , which is reactive to eye opening and closing.  Diffuse beta activity is seen, with a bilateral frontal preponderance.  There were some rare sharply contoured alpha waves without clear epiliptiform morphology in the left temporal region, which did not correlate with her abnormal movements.  There were no generalized abnormalities are seen.  No focal or generalized epileptiform discharges are seen.  Study was recorded during one of the patient's habitual spells.  Several of her habitual jerking movements were captured without electrographic correlate.  Stage II sleep is not seen.  Hyperventilation and photic stimulation were performed, and produced no abnormalities.  ECG revealed normal cardiac rate and rhythm.  Impression: This routine EEG of the awake states with activating procedures is within normal limits.  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  Shoji Pertuit R. Everlena CooperJaffe, DO

## 2014-05-07 ENCOUNTER — Encounter: Payer: Self-pay | Admitting: Family Medicine

## 2014-05-07 ENCOUNTER — Ambulatory Visit (INDEPENDENT_AMBULATORY_CARE_PROVIDER_SITE_OTHER): Payer: BC Managed Care – PPO | Admitting: Family Medicine

## 2014-05-07 VITALS — BP 150/100 | Temp 98.0°F | Wt 160.0 lb

## 2014-05-07 DIAGNOSIS — R030 Elevated blood-pressure reading, without diagnosis of hypertension: Secondary | ICD-10-CM

## 2014-05-07 MED ORDER — HYDROCHLOROTHIAZIDE 12.5 MG PO TABS: 12.5000 mg | ORAL_TABLET | Freq: Every day | ORAL | Status: DC

## 2014-05-07 NOTE — Progress Notes (Signed)
   Subjective:    Patient ID: Colleen ClayAnne K Rodelo-Mezgar, female    DOB: 07/18/1950, 63 y.o.   MRN: [id number]  HPI Colleen [name] is a 63 year old female who comes in today for evaluation of elevation of her blood pressure  This really was back to a couple weeks ago when she had a spell at work. We send her to neurology they did a workup which shows no evidence of seizure disorder. The neurologist feels like is probably a stress-related phenomenon. He advised her to take Valium. They ordered an MRI to be done later on this week.  She has no symptoms of any underlying neurologic disorder it really sounds to me more like it stress. She's had a history of panic attacks in the past.  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  2 others to sister's father all have hypertension. About a week ago her blood pressure went up and she's been monitoring at home. Most of her blood pressures are elevated above goal. Goal is less than 135 systolic less than 85 diastolic   Review of Systems Review of systems otherwise negative    Objective:   Physical Exam  Well-developed well-nourished female in no acute distress vital signs stable she is afebrile BP right arm sitting position 150/100 pulse 60 and regular      Assessment & Plan:  Hypertension at hydrochlorothiazide 12.5 mg daily,,,,,,, monitor blood pressure follow-up in 3 weeks  Episodes ....... suggested that her for MRI and is indeed normal that when you look at the underlying psychiatric options. Would consider consultation with Colleen [name] or Colleen [name] ,,,,,,,,,,

## 2014-05-07 NOTE — Patient Instructions (Signed)
Hydrochlorothiazide 12.5 mg........Marland Kitchen. 1 daily in the morning  Corgard 40 mg........ one half tab daily......... if you have symptomatic PVCs and take 1 daily for a couple days  Check your blood pressure daily in the morning  Return in 3 weeks for follow-up.

## 2014-05-07 NOTE — Progress Notes (Signed)
Pre visit review using our clinic review tool, if applicable. No additional management support is needed unless otherwise documented below in the visit note. 

## 2014-05-09 ENCOUNTER — Ambulatory Visit
Admission: RE | Admit: 2014-05-09 | Discharge: 2014-05-09 | Disposition: A | Discharge status: Home / Self Care | Payer: BC Managed Care – PPO | Source: Ambulatory Visit | Attending: Neurology | Admitting: Neurology

## 2014-05-09 DIAGNOSIS — G253 Myoclonus: Secondary | ICD-10-CM

## 2014-05-09 DIAGNOSIS — R404 Transient alteration of awareness: Secondary | ICD-10-CM

## 2014-05-09 MED ORDER — GADOBENATE DIMEGLUMINE 529 MG/ML IV SOLN
14.0000 mL | Freq: Once | INTRAVENOUS | Status: AC | PRN
  Administered 2014-05-09: 14 mL via INTRAVENOUS

## 2014-05-14 ENCOUNTER — Ambulatory Visit: Payer: BC Managed Care – PPO | Admitting: Neurology

## 2014-05-15 ENCOUNTER — Ambulatory Visit (INDEPENDENT_AMBULATORY_CARE_PROVIDER_SITE_OTHER): Payer: BC Managed Care – PPO | Admitting: Neurology

## 2014-05-15 ENCOUNTER — Encounter: Payer: Self-pay | Admitting: Neurology

## 2014-05-15 ENCOUNTER — Other Ambulatory Visit: Payer: BC Managed Care – PPO

## 2014-05-15 VITALS — BP 140/72 | HR 60 | Resp 18 | Wt 159.0 lb

## 2014-05-15 DIAGNOSIS — R259 Unspecified abnormal involuntary movements: Secondary | ICD-10-CM

## 2014-05-15 DIAGNOSIS — R404 Transient alteration of awareness: Secondary | ICD-10-CM

## 2014-05-15 NOTE — Progress Notes (Signed)
NEUROLOGY FOLLOW UP OFFICE NOTE  Colleen [name] [id number]  HISTORY OF PRESENT ILLNESS: Colleen [name] is a 63 year old right-handed woman with history of acute thyroiditis status post thyroidectomy, asthma, depression, anxiety, medication-induced seizure, GERD and goiter who follows up for transient altered awareness associated with myoclonic jerks  UPDATE: She had an MRI of the brain with and without contrast performed on 05/10/14, which was personally reviewed and is normal.    She still has spells, but they are more brief.  They last 45 to 60 minutes now.  They don't occur often.  Her last spell was Friday.  She denies any increased stress.  HISTORY: On 04/24/14, she developed diffuse weakness and fatigue with feeling that she was going to pass out.  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  She also had been having intermittent chest pain and mild shortness of breath.  She also reports muscle spasms involving the shoulders, neck and back. The chest pain seemed more likely related to the muscle spasms.  She presented to the ED.  CTA of the chest was negative for PE.  TSH was 0.326.  Troponins were negative.  EKG showed non-specific T wave abnormalities.  UA did indicate UTI.   It was thought that her symptoms were related to UTI.  She was discharged on Keflex.  She returned to the ED on 04/26/14 with recurrent weakness, chest pain and muscle spasms.  During her stay in the ED, she exhibited 1-2 seconds of full-body muscle spasms.  CT of the brain was unremarkable.  She refused admission for pre-syncope workup.   Since 04/24/14, she has had approximately 4 episodes.  She is currently having an episode in the office.  She has a sense of fatigue and slowed cognitive thinking.  She knows what she wants to say but has difficulty getting words out.  She also has jerks of the shoulder and neck.  This time, she reports slight nausea.  She feels lightheaded.  She is fully conscious.   Since Sunday, she has intermittent numbness on the right side of her face and right hand.  The episodes usually start around 9-10 am and last 4 hours.  Afterwards, she has a diffuse aching headache for a few minutes.  She denies prior episodes of similar spells.  She reports that she had "seizure-like activity" 25 years ago due to cyclobenzaprine.  She has remote history of headaches but nothing recent.  She denies any fevers.  She reports that she had a recent change to her levothyroxine after experiencing palpitations, but her levels have been okay.  Since she was having a spell while in the office, she was able to have a routine EEG performed during her spell.  Several of her muscle jerking was captured without electrographic correlate.  There were some rare sharply contoured alpha waves without clear epileptiform morphology seen in the left temporal lobe which did not correlate with any of her movements, but there were no epileptiform discharges or seizure activity seen.  Essentially the EEG was within normal limits and confirmed that the muscle jerking was non-epileptic.  PAST MEDICAL HISTORY: Past Medical History  Diagnosis Date  . Anxiety   . GERD (gastroesophageal reflux disease)   . Heartburn   . Seizures     MEDICATION INDUCED  . Family history of anesthesia complication     mother "nausea and vomiting post surger"  . Arrhythmia     "does not see cardiologist, sees pcp, Dr. Alonza Smoker  .  Asthma     DX IN MARCH 2012 "allergy induced asthma"  . Bronchitis     hx of  . Urinary tract infection     hx of  . Herpes     on medication for outbreaks only  . Depression     "past hx of for Panic attacks, not on medication at this time"  . 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 Allergy     MEDICATIONS: Current Outpatient Prescriptions on File Prior to Visit  Medication Sig Dispense Refill  .  acetaminophen (TYLENOL) 500 MG tablet Take 1,000 mg by mouth daily as needed for moderate pain (shoulder & neck pain).    . diazepam (VALIUM) 2 MG tablet Take 2 mg by mouth daily as needed (Sleep).    . diphenhydrAMINE (BENADRYL) 25 MG tablet Take 25 mg by mouth at bedtime.     Marland Kitchen. ibuprofen (ADVIL,MOTRIN) 200 MG tablet Take 600 mg by mouth every 6 (six) hours as needed for moderate pain (chest & neck pain).    Marland Kitchen. levothyroxine (SYNTHROID, LEVOTHROID) 100 MCG tablet Take 1 tablet (100 mcg total) by mouth daily. 90 tablet 3  . nadolol (CORGARD) 40 MG tablet Take 0.5 tablets (20 mg total) by mouth daily. 90 tablet 3  . hydrochlorothiazide (HYDRODIURIL) 12.5 MG tablet Take 1 tablet (12.5 mg total) by mouth daily. 90 tablet 3   No current facility-administered medications on file prior to visit.    ALLERGIES: Allergies  Allergen Reactions  . Cyclobenzaprine Hcl     REACTION: seizure    FAMILY HISTORY: Family History  Problem Relation Age of Onset  . Heart failure Father   . Hypertension Father   . Seizures Father   . Heart disease Father   . Seizures Brother   . Irritable bowel syndrome Brother   . Cirrhosis Brother   . Hypertension Brother     x 4  . Fibromyalgia Sister   . Atrial fibrillation Sister   . Prostate cancer Brother   . Cervical cancer Mother     SOCIAL HISTORY: History   Social History  . Marital Status: Married    Spouse Name: N/A    Number of Children: 2  . Years of Education: N/A   Occupational History  . [employer]    Social History Main Topics  . Smoking status: Former Smoker    Quit date: 05/01/1999  . Smokeless tobacco: Never Used  . Alcohol Use: No  . Drug Use: No  . Sexual Activity:    Partners: Male   Other Topics Concern  . Not on file   Social History Narrative    REVIEW OF SYSTEMS: Constitutional: No fevers, chills, or sweats, no generalized fatigue, change in appetite Eyes: No visual changes, double vision, eye  pain Ear, nose and throat: No hearing loss, ear pain, nasal congestion, sore throat Cardiovascular: No chest pain, palpitations Respiratory:  No shortness of breath at rest or with exertion, wheezes GastrointestinaI: No nausea, vomiting, diarrhea, abdominal pain, fecal incontinence Genitourinary:  No dysuria, urinary retention or frequency Musculoskeletal:  No neck pain, back pain Integumentary: No rash, pruritus, skin lesions Neurological: as above Psychiatric: No depression, insomnia, anxiety Endocrine: No palpitations, fatigue, diaphoresis, mood swings, change in appetite, change in weight, increased thirst Hematologic/Lymphatic:  No anemia, purpura, petechiae. Allergic/Immunologic: no itchy/runny eyes, nasal congestion, recent allergic reactions, rashes  PHYSICAL EXAM: Filed Vitals:  05/15/14 0951  BP: 140/72  Pulse: 60  Resp: 18   General: No acute distress Head:  Normocephalic/atraumatic  IMPRESSION: Episodes of body jerking associated with altered sensorium.  I cannot find a neurologic etiology for her symptoms.  They are not seizures.  I don't suspect it is a movement disorder, as the episodes are accompanied by a sensation of altered sensorium.  Conceivably, stress or depression may be a factor.  We did discuss this as a possibility as well.  Since these episodes are recurring, I think it would be appropriate for a second opinion.  If I was to characterize her symptoms neurologically, it would most likely fit under possible seizure.  Therefore, I will refer her to my colleague, Dr. Karel JarvisAquino, who is an epileptologist.  I answered all questions to the best of my ability.  15 minutes spent with patient, 100% spent discussing results, differential diagnoses and coordinating plan.  Shon MilletAdam Jaffe, DO  CC:  Kelle DartingJeffrey Todd, MD

## 2014-05-15 NOTE — Patient Instructions (Signed)
Your tests are normal.  I have no neurologic explanation for your symptoms.  If I was to assume any neurologic etiology, I would most likely suspect seizure.  Therefore I will refer you to my colleague, Dr. Karel JarvisAquino, for a second opinion.  She is a Civil engineer, contractingseizure-disorder specialist.

## 2014-05-16 ENCOUNTER — Telehealth: Payer: Self-pay | Admitting: Family Medicine

## 2014-05-16 DIAGNOSIS — R000 Tachycardia, unspecified: Secondary | ICD-10-CM

## 2014-05-16 DIAGNOSIS — R030 Elevated blood-pressure reading, without diagnosis of hypertension: Secondary | ICD-10-CM

## 2014-05-16 NOTE — Telephone Encounter (Signed)
Okay per Dr Tawanna Coolerodd.  Referral placed.

## 2014-05-16 NOTE — Telephone Encounter (Signed)
Pt states her bp is still not under control. Would like to know what dr todd wants her to do. Pt states she has increased med to 40 mg. Pt has not started diuretic. Pt would like a cb asap.

## 2014-05-16 NOTE — Telephone Encounter (Signed)
Per Dr Tawanna Coolerodd patient should increase her beta blocker to one tab daily and begin her HCTZ.  Record her blood pressure readings.  Patient is aware.  Patient is requesting to see a cardiologist?

## 2014-05-24 ENCOUNTER — Telehealth: Payer: Self-pay | Admitting: Neurology

## 2014-05-24 NOTE — Telephone Encounter (Signed)
Pt wants to know if you talk to Dr Karel Jarvisaquino to see if she can get in sooner than 06-25-14 pt phone number is [phone]

## 2014-05-24 NOTE — Telephone Encounter (Signed)
I spoke with patient and advised this is the earliest opening that Dr Karel JarvisAquino has  06/25/14 and that Dr Everlena CooperJaffe did indeed speak with Dr Karel JarvisAquino regarding this appt

## 2014-05-27 ENCOUNTER — Encounter: Payer: Self-pay | Admitting: Cardiovascular Disease

## 2014-05-27 ENCOUNTER — Ambulatory Visit (INDEPENDENT_AMBULATORY_CARE_PROVIDER_SITE_OTHER): Payer: BC Managed Care – PPO | Admitting: Cardiovascular Disease

## 2014-05-27 ENCOUNTER — Telehealth: Payer: Self-pay | Admitting: Neurology

## 2014-05-27 VITALS — BP 134/74 | HR 58 | Ht 65.0 in | Wt 158.0 lb

## 2014-05-27 DIAGNOSIS — R002 Palpitations: Secondary | ICD-10-CM

## 2014-05-27 DIAGNOSIS — I471 Supraventricular tachycardia: Secondary | ICD-10-CM

## 2014-05-27 DIAGNOSIS — R000 Tachycardia, unspecified: Secondary | ICD-10-CM

## 2014-05-27 MED ORDER — NADOLOL 40 MG PO TABS: 40.0000 mg | ORAL_TABLET | Freq: Every day | ORAL | Status: DC

## 2014-05-27 NOTE — Assessment & Plan Note (Signed)
Colleen [name] presents for further evaluation of her weakness episodes. She had near syncopal episode. These episodes are associated with some fast heart rate but he's never had any significant arrhythmias measured at the emergency room or at her doctor's office.  Her symptoms are really more consistent with a metabolic issue and not consistent with a cardiac arrhythmia per se. I offered to get an echocardiogram in place monitor I told her that at a very low suspicion that this was a cardiac issue.  He declined to have echo or the monitor. Her TSH has been very low - c/w hyperthyroidism ( possible over-replacement)    She did have her Corgard increased after these episodes and she thinks this is helpful. I suspect this is because Corgard he is a fairly good treatment for hyperthyroidism.

## 2014-05-27 NOTE — Patient Instructions (Signed)
Your physician recommends that you continue on your current medications as directed. Please refer to the Current Medication list given to you today.  Your physician recommends that you schedule a follow-up appointment in: as needed with Dr. Nahser  

## 2014-05-27 NOTE — Progress Notes (Signed)
Colleen [name] Date of Birth  10/09/1950       [facility]    [place] [facility] [address], [address]  [address], [address] [place], [place]  [address]   [place], [place]  [id number] [phone]     [phone]   Fax  [phone]     Fax [phone]  Problem List: 1. Sinus tachycardia 2. hypothyroidism  History of Present Illness:  Colleen [name] is a 63 y.o. female who is referred for evaluation of sinus tachycardia. She presented to her medical doctor's office with extreme fatigue. On Oct. 14. , she had a particularly bad episode.  She "went limp".  No LOC. Was taken to the ER.    " Everything was slow. "  Was tired the following day.    Occurred again 2 days later. Was seen by her medical doctor ( Dr. Tawanna Coolerodd)  She was referred here for eval of these episodes.  Does aerobics and weight training.   Feels great after her workouts.  No CP , no dyspnea., no weakness.  Has had her thyroid med changed several times over the past several months. Started HCTZ on Nov. 5, did not bring on or worsen any of these episodes.   Fhx - father had CHf , died of chf at 7669 Siblings with HTN  SHx nonsmoker   Current Outpatient Prescriptions on File Prior to Visit  Medication Sig Dispense Refill  . acetaminophen (TYLENOL) 500 MG tablet Take 1,000 mg by mouth daily as needed for moderate pain (shoulder & neck pain).    . diazepam (VALIUM) 2 MG tablet Take 2 mg by mouth daily as needed (Sleep).    . diphenhydrAMINE (BENADRYL) 25 MG tablet Take 25 mg by mouth at bedtime.     . hydrochlorothiazide (HYDRODIURIL) 12.5 MG tablet Take 1 tablet (12.5 mg total) by mouth daily. 90 tablet 3  . ibuprofen (ADVIL,MOTRIN) 200 MG tablet Take 600 mg by mouth every 6 (six) hours as needed for moderate pain (chest & neck pain).    Marland Kitchen. levothyroxine (SYNTHROID, LEVOTHROID) 100 MCG tablet Take 1 tablet (100 mcg total) by mouth daily. 90 tablet 3   No current facility-administered medications on  file prior to visit.    Allergies  Allergen Reactions  . Cyclobenzaprine Hcl     REACTION: seizure    Past Medical History  Diagnosis Date  . Anxiety   . GERD (gastroesophageal reflux disease)   . Heartburn   . Seizures     MEDICATION INDUCED  . Family history of anesthesia complication     mother "nausea and vomiting post surger"  . Arrhythmia     "does not see cardiologist, sees pcp, Dr. Alonza SmokerJeff Todd  . Asthma     DX IN MARCH 2012 "allergy induced asthma"  . Bronchitis     hx of  . Urinary tract infection     hx of  . Herpes     on medication for outbreaks only  . Depression     "past hx of for Panic attacks, not on medication at this time"  . 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 Allergy     Past Surgical History  Procedure Laterality Date  . Spinal cystectomy      "where pilonidal cyst was"  . Knee arthroscopy      right  . Cesarean section  x 2  . Pilonidal cyst excision    . Elbow surgery      right  . Colonoscopy    . Thyroidectomy  04/07/2012    Procedure: THYROIDECTOMY;  Surgeon: Currie Parishristian J Streck, MD;  Location: [facility] OR;  Service: General;  Laterality: N/A;       History  Smoking status  . Former Smoker  . Quit date: 05/01/1999  Smokeless tobacco  . Never Used    History  Alcohol Use No    Family History  Problem Relation Age of Onset  . Heart failure Father   . Hypertension Father   . Seizures Father   . Heart disease Father   . Seizures Brother   . Irritable bowel syndrome Brother   . Cirrhosis Brother   . Hypertension Brother     x 4  . Fibromyalgia Sister   . Atrial fibrillation Sister   . Prostate cancer Brother   . Cervical cancer Mother     Reviw of Systems:  Reviewed in the HPI.  All other systems are negative.  Physical Exam: Blood pressure 134/74, pulse 58, height 5\' 5"  (1.651 m), weight 158 lb (71.668 kg), SpO2 92  %. Wt Readings from Last 3 Encounters:  05/27/14 158 lb (71.668 kg)  05/15/14 159 lb (72.122 kg)  05/07/14 160 lb (72.576 kg)     General: Well developed, well nourished, in no acute distress.  Head: Normocephalic, atraumatic, sclera non-icteric, mucus membranes are moist,   Neck: Supple. Carotids are 2 + without bruits. No JVD   Lungs: Clear   Heart: RR, normal S1S2  Abdomen: Soft, non-tender, non-distended with normal bowel sounds.  Msk:  Strength and tone are normal   Extremities: No clubbing or cyanosis. No edema.  Distal pedal pulses are 2+ and equal    Neuro: CN II - XII intact.  Alert and oriented X 3.   Psych:  Normal   ECG: Oct. 17, 2015:  NSR at 65.  No ST or T wave changes  Assessment / Plan:

## 2014-05-27 NOTE — Telephone Encounter (Signed)
Pt canceled new patient appt to see Dr Karel JarvisAquino on 12-15and I Notified Dr Everlena CooperJaffe as well

## 2014-05-29 ENCOUNTER — Other Ambulatory Visit: Payer: BC Managed Care – PPO

## 2014-05-29 ENCOUNTER — Other Ambulatory Visit (INDEPENDENT_AMBULATORY_CARE_PROVIDER_SITE_OTHER): Payer: BC Managed Care – PPO

## 2014-05-29 DIAGNOSIS — E0590 Thyrotoxicosis, unspecified without thyrotoxic crisis or storm: Secondary | ICD-10-CM

## 2014-05-29 LAB — TSH: TSH: 0.99 u[IU]/mL (ref 0.35–4.50)

## 2014-05-31 ENCOUNTER — Other Ambulatory Visit: Payer: Self-pay | Admitting: *Deleted

## 2014-05-31 MED ORDER — LEVOTHYROXINE SODIUM 100 MCG PO TABS: 100.0000 ug | ORAL_TABLET | Freq: Every day | ORAL | Status: DC

## 2014-06-13 ENCOUNTER — Telehealth: Payer: Self-pay | Admitting: Family Medicine

## 2014-06-13 NOTE — Telephone Encounter (Signed)
Pt is going to dc hydrochlorothiazide (HYDRODIURIL) 12.5 MG tablet . Does not like the side effects. Would like to know how dr todd would prefer her to come off safely? Any instructions?

## 2014-06-13 NOTE — Telephone Encounter (Signed)
Per Dr Tawanna Coolerodd.  Patient should get directions from cardiology.

## 2014-06-25 ENCOUNTER — Ambulatory Visit: Payer: BC Managed Care – PPO | Admitting: Neurology

## 2014-06-30 ENCOUNTER — Other Ambulatory Visit: Payer: Self-pay | Admitting: Internal Medicine

## 2014-07-08 ENCOUNTER — Telehealth: Payer: Self-pay | Admitting: Family Medicine

## 2014-07-08 DIAGNOSIS — R030 Elevated blood-pressure reading, without diagnosis of hypertension: Secondary | ICD-10-CM

## 2014-07-08 MED ORDER — HYDROCHLOROTHIAZIDE 12.5 MG PO TABS: 12.5000 mg | ORAL_TABLET | Freq: Every day | ORAL | Status: DC

## 2014-07-08 NOTE — Telephone Encounter (Signed)
Pt needs new rx hctz 12.5 mg #90 w/[facility]

## 2014-08-19 ENCOUNTER — Other Ambulatory Visit: Payer: Self-pay | Admitting: Family Medicine

## 2014-08-19 DIAGNOSIS — Z1231 Encounter for screening mammogram for malignant neoplasm of breast: Secondary | ICD-10-CM

## 2014-08-27 ENCOUNTER — Encounter: Payer: Self-pay | Admitting: Family Medicine

## 2014-08-27 ENCOUNTER — Ambulatory Visit (INDEPENDENT_AMBULATORY_CARE_PROVIDER_SITE_OTHER): Payer: BLUE CROSS/BLUE SHIELD | Admitting: Family Medicine

## 2014-08-27 VITALS — BP 128/82 | HR 60 | Temp 97.3°F | Ht 65.0 in | Wt 160.4 lb

## 2014-08-27 DIAGNOSIS — I471 Supraventricular tachycardia: Secondary | ICD-10-CM

## 2014-08-27 DIAGNOSIS — R8290 Unspecified abnormal findings in urine: Secondary | ICD-10-CM

## 2014-08-27 DIAGNOSIS — E890 Postprocedural hypothyroidism: Secondary | ICD-10-CM

## 2014-08-27 DIAGNOSIS — R000 Tachycardia, unspecified: Secondary | ICD-10-CM

## 2014-08-27 DIAGNOSIS — I10 Essential (primary) hypertension: Secondary | ICD-10-CM

## 2014-08-27 DIAGNOSIS — R002 Palpitations: Secondary | ICD-10-CM

## 2014-08-27 LAB — POCT URINALYSIS DIPSTICK
BILIRUBIN UA: NEGATIVE
Glucose, UA: NEGATIVE
KETONES UA: NEGATIVE
Nitrite, UA: NEGATIVE
PH UA: 5
PROTEIN UA: NEGATIVE
RBC UA: NEGATIVE
Spec Grav, UA: <=1.005
Urobilinogen, UA: 0.2

## 2014-08-27 LAB — TSH: TSH: 1.04 u[IU]/mL (ref 0.35–4.50)

## 2014-08-27 NOTE — Patient Instructions (Signed)
BEFORE YOU LEAVE: -labs -schedule new patient visit in 3 months

## 2014-08-27 NOTE — Progress Notes (Signed)
HPI:  Acute visit for:  Hypothyroidism: -s/p thyroidectomy for goiter, denies ca of thyroid -she wants to check her TSH -she feels like be undertreated, her cardiologist per notes was concerned she was overtreated -no regular exercise currently, 7 hours of sleep per night, fatigue chronically -on daily of synthroid - generic  Odor to urine: -for almost a month -she wants to check for a UTI  -hx of UTI -denies: flank, denies urgency, frequency, hematuria, nausea, vomiting  HTN/intermittent PVCs and Tachy -on BB and diuretic -she wonders if she could get off these eventually - diuretic makes her pee -she feels all of her BP and heart issues are due to her thyroid -denies: CP, SOB, DOE, recent heart symptoms -saw neurologist and cardiologist in 2015 for episode of fatigue and pre-syncope with neg workup   ROS: See pertinent positives and negatives per HPI.  Past Medical History  Diagnosis Date  . Anxiety   . GERD (gastroesophageal reflux disease)   . Heartburn   . Seizures     MEDICATION INDUCED  . Family history of anesthesia complication     mother "nausea and vomiting post surger"  . Arrhythmia     "does not see cardiologist, sees pcp, Dr. Alonza Smoker  . Asthma     DX IN MARCH 2012 "allergy induced asthma"  . Bronchitis     hx of  . Urinary tract infection     hx of  . Herpes     on medication for outbreaks only  . Depression     "past hx of for Panic attacks, not on medication at this time"  . 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 Allergy     Past Surgical History  Procedure Laterality Date  . Spinal cystectomy      "where pilonidal cyst was"  . Knee arthroscopy      right  . Cesarean section      x 2  . Pilonidal cyst excision    . Elbow surgery      right  . Colonoscopy    . Thyroidectomy  04/07/2012    Procedure: THYROIDECTOMY;  Surgeon: Currie Paris, MD;  Location: [facility] OR;  Service: General;  Laterality: N/A;       Family History  Problem Relation Age of Onset  . Heart failure Father   . Hypertension Father   . Seizures Father   . Heart disease Father   . Seizures Brother   . Irritable bowel syndrome Brother   . Cirrhosis Brother   . Hypertension Brother     x 4  . Fibromyalgia Sister   . Atrial fibrillation Sister   . Prostate cancer Brother   . Cervical cancer Mother     History   Social History  . Marital Status: Married    Spouse Name: N/A  . Number of Children: 2  . Years of Education: N/A   Occupational History  . [employer]    Social History Main Topics  . Smoking status: Former Smoker    Quit date: 05/01/1999  . Smokeless tobacco: Never Used  . Alcohol Use: No  . Drug Use: No  . Sexual Activity:    Partners: Male   Other Topics Concern  . None   Social History Narrative     Current outpatient prescriptions:  .  acetaminophen (TYLENOL) 500 MG tablet, Take 1,000 mg by  mouth daily as needed for moderate pain (shoulder & neck pain)., Disp: , Rfl:  .  diazepam (VALIUM) 2 MG tablet, Take 2 mg by mouth daily as needed (Sleep)., Disp: , Rfl:  .  diphenhydrAMINE (BENADRYL) 25 MG tablet, Take 25 mg by mouth at bedtime. , Disp: , Rfl:  .  hydrochlorothiazide (HYDRODIURIL) 12.5 MG tablet, Take 1 tablet (12.5 mg total) by mouth daily., Disp: 90 tablet, Rfl: 3 .  levothyroxine (SYNTHROID, LEVOTHROID) 100 MCG tablet, Take 1 tablet (100 mcg total) by mouth daily., Disp: 90 tablet, Rfl: 3 .  nadolol (CORGARD) 40 MG tablet, Take 1 tablet (40 mg total) by mouth daily., Disp: , Rfl:   EXAM:  Filed Vitals:   08/27/14 1112  BP: 128/82  Pulse: 60  Temp: 97.3 F (36.3 C)    Body mass index is 26.69 kg/(m^2).  GENERAL: vitals reviewed and listed above, alert, oriented, appears well hydrated and in no acute distress  HEENT: atraumatic, conjunttiva clear, no obvious abnormalities on  inspection of external nose and ears  NECK: no obvious masses on inspection  LUNGS: clear to auscultation bilaterally, no wheezes, rales or rhonchi, good air movement  CV: HRRR, no peripheral edema  ABD: no CVA TTP  MS: moves all extremities without noticeable abnormality  PSYCH: pleasant and cooperative, no obvious depression or anxiety  ASSESSMENT AND PLAN:  Discussed the following assessment and plan:  Abnormal urine odor - Plan: POC Urinalysis Dipstick, TSH, Culture, Urine -udip with mild leuks, cx pending  Postoperative hypothyroidism -check TSH, she is very concerned about her fluctuating thyroid levels, she wants to see endo if continues to be an issue for her  Palpitations Sinus tachycardia Essential hypertension -continue current meds for now -she wants to do trial off BB in the future if thyroid stable -she may consider other meds for HTN but opted not to change now  -Patient advised to return or notify a doctor immediately if symptoms worsen or persist or new concerns arise.  Patient Instructions  BEFORE YOU LEAVE: -labs -schedule new patient visit in 3 months     Colleen Caicedo R.

## 2014-08-27 NOTE — Progress Notes (Signed)
Pre visit review using our clinic review tool, if applicable. No additional management support is needed unless otherwise documented below in the visit note. 

## 2014-08-28 ENCOUNTER — Telehealth: Payer: Self-pay | Admitting: Family Medicine

## 2014-08-28 NOTE — Telephone Encounter (Signed)
emmi emailed °

## 2014-08-29 LAB — URINE CULTURE
COLONY COUNT: NO GROWTH
Organism ID, Bacteria: NO GROWTH

## 2014-09-05 ENCOUNTER — Telehealth: Payer: Self-pay | Admitting: Family Medicine

## 2014-09-05 MED ORDER — DIAZEPAM 2 MG PO TABS: 2.0000 mg | ORAL_TABLET | Freq: Every day | ORAL | Status: DC | PRN

## 2014-09-05 NOTE — Telephone Encounter (Signed)
Rx faxed

## 2014-09-05 NOTE — Telephone Encounter (Signed)
Pt needs refill on diazepam 2 mg #180 W/REFILLS [facility]

## 2014-09-25 ENCOUNTER — Telehealth: Payer: Self-pay | Admitting: Family Medicine

## 2014-09-25 NOTE — Telephone Encounter (Signed)
Pt saw de kim on 2/16 and discussed bp med. Would like a cb to discuss options to switch to something else . Pt has continued headaches.

## 2014-09-26 NOTE — Telephone Encounter (Signed)
Noted  

## 2014-09-26 NOTE — Telephone Encounter (Signed)
Patient called back and is scheduled for 10/01/14 at 0800.  She needed first appointment in the morning.

## 2014-09-26 NOTE — Telephone Encounter (Signed)
Would advise appt for this with me or her cardiologist. Thank you.

## 2014-10-01 ENCOUNTER — Encounter: Payer: Self-pay | Admitting: Family Medicine

## 2014-10-01 ENCOUNTER — Ambulatory Visit (INDEPENDENT_AMBULATORY_CARE_PROVIDER_SITE_OTHER): Payer: BLUE CROSS/BLUE SHIELD | Admitting: Family Medicine

## 2014-10-01 VITALS — BP 128/88 | HR 71 | Temp 98.0°F | Ht 65.0 in | Wt 159.8 lb

## 2014-10-01 DIAGNOSIS — T887XXA Unspecified adverse effect of drug or medicament, initial encounter: Secondary | ICD-10-CM | POA: Diagnosis not present

## 2014-10-01 DIAGNOSIS — I10 Essential (primary) hypertension: Secondary | ICD-10-CM

## 2014-10-01 DIAGNOSIS — T50905A Adverse effect of unspecified drugs, medicaments and biological substances, initial encounter: Secondary | ICD-10-CM

## 2014-10-01 DIAGNOSIS — T502X5A Adverse effect of carbonic-anhydrase inhibitors, benzothiadiazides and other diuretics, initial encounter: Secondary | ICD-10-CM | POA: Diagnosis not present

## 2014-10-01 MED ORDER — AMLODIPINE BESYLATE 5 MG PO TABS: 5.0000 mg | ORAL_TABLET | Freq: Every day | ORAL | Status: DC

## 2014-10-01 NOTE — Patient Instructions (Signed)
Stop the hctz  Start the norvasc 5 mg  Folow up as scheduled or sooner if needed

## 2014-10-01 NOTE — Progress Notes (Addendum)
HPI:  Follow up:  HTN: -on BB and then started on HCTZ -she reports she does not like the hctz as nausea urinary frequency and headaches -she is sure the headaches are from the medications -she is on  of nadolol and 12.5 mg HCTZ -denies: CP, SOB, DOE, swelling, palpitations  ROS: See pertinent positives and negatives per HPI.  Past Medical History  Diagnosis Date  . Anxiety   . GERD (gastroesophageal reflux disease)   . Heartburn   . Seizures     MEDICATION INDUCED  . Family history of anesthesia complication     mother "nausea and vomiting post surger"  . Arrhythmia     "does not see cardiologist, sees pcp, Dr. Alonza Smoker  . Asthma     DX IN MARCH 2012 "allergy induced asthma"  . Bronchitis     hx of  . Urinary tract infection     hx of  . Herpes     on medication for outbreaks only  . Depression     "past hx of for Panic attacks, not on medication at this time"  . 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 Allergy     Past Surgical History  Procedure Laterality Date  . Spinal cystectomy      "where pilonidal cyst was"  . Knee arthroscopy      right  . Cesarean section      x 2  . Pilonidal cyst excision    . Elbow surgery      right  . Colonoscopy    . Thyroidectomy  04/07/2012    Procedure: THYROIDECTOMY;  Surgeon: Currie Paris, MD;  Location: [facility] OR;  Service: General;  Laterality: N/A;       Family History  Problem Relation Age of Onset  . Heart failure Father   . Hypertension Father   . Seizures Father   . Heart disease Father   . Seizures Brother   . Irritable bowel syndrome Brother   . Cirrhosis Brother   . Hypertension Brother     x 4  . Fibromyalgia Sister   . Atrial fibrillation Sister   . Prostate cancer Brother   . Cervical cancer Mother     History   Social History  . Marital Status: Married    Spouse Name: N/A  . Number of  Children: 2  . Years of Education: N/A   Occupational History  . [employer]    Social History Main Topics  . Smoking status: Former Smoker    Quit date: 05/01/1999  . Smokeless tobacco: Never Used  . Alcohol Use: No  . Drug Use: No  . Sexual Activity:    Partners: Male   Other Topics Concern  . None   Social History Narrative     Current outpatient prescriptions:  .  acetaminophen (TYLENOL) 500 MG tablet, Take 1,000 mg by mouth daily as needed for moderate pain (shoulder & neck pain)., Disp: , Rfl:  .  diazepam (VALIUM) 2 MG tablet, Take 1 tablet (2 mg total) by mouth daily as needed (Sleep)., Disp: 90 tablet, Rfl: 1 .  diphenhydrAMINE (BENADRYL) 25 MG tablet, Take 25 mg by mouth at bedtime. , Disp: , Rfl:  .  levothyroxine (SYNTHROID, LEVOTHROID) 100 MCG tablet, Take 1 tablet (100 mcg total) by mouth daily., Disp: 90 tablet, Rfl: 3 .  nadolol (CORGARD) 40 MG tablet, Take 1  tablet (40 mg total) by mouth daily., Disp: , Rfl:  .  amLODipine (NORVASC) 5 MG tablet, Take 1 tablet (5 mg total) by mouth daily., Disp: 30 tablet, Rfl: 3  EXAM:  Filed Vitals:   10/01/14 0804  BP: 128/88  Pulse: 71  Temp: 98 F (36.7 C)    Body mass index is 26.59 kg/(m^2).  GENERAL: vitals reviewed and listed above, alert, oriented, appears well hydrated and in no acute distress  HEENT: atraumatic, conjunttiva clear, no obvious abnormalities on inspection of external nose and ears  NECK: no obvious masses on inspection  LUNGS: clear to auscultation bilaterally, no wheezes, rales or rhonchi, good air movement  CV: HRRR, no peripheral edema  MS: moves all extremities without noticeable abnormality  PSYCH: pleasant and cooperative, no obvious depression or anxiety  ASSESSMENT AND PLAN:  Discussed the following assessment and plan:  Essential hypertension - Plan: amLODipine (NORVASC) 5 MG tablet  -discussed options for change in blood pressure medication as not  tolerating current medications -she opted to replace hctz with norvasc and follow BP at home -follow up as scheduled -Patient advised to return or notify a doctor immediately if symptoms worsen or persist or new concerns arise.  Patient Instructions  Stop the hctz  Start the norvasc 5 mg  Folow up as scheduled or sooner if needed     Colleen [name], Colleen ClientHANNAH R.

## 2014-10-01 NOTE — Progress Notes (Signed)
Pre visit review using our clinic review tool, if applicable. No additional management support is needed unless otherwise documented below in the visit note. 

## 2014-10-04 ENCOUNTER — Ambulatory Visit
Admission: RE | Admit: 2014-10-04 | Discharge: 2014-10-04 | Disposition: A | Discharge status: Home / Self Care | Payer: BLUE CROSS/BLUE SHIELD | Source: Ambulatory Visit | Attending: Family Medicine | Admitting: Family Medicine

## 2014-10-04 DIAGNOSIS — Z1231 Encounter for screening mammogram for malignant neoplasm of breast: Secondary | ICD-10-CM

## 2014-10-14 ENCOUNTER — Telehealth: Payer: Self-pay | Admitting: Family Medicine

## 2014-10-14 NOTE — Telephone Encounter (Signed)
I called the pt and informed her of the message below and offered her an appt as she stated she does not feel this is allergies but she is not sure.  Patient denies any shortness of breath nor swelling and stated she will try Benadryl and call back if she is not better tomorrow.

## 2014-10-14 NOTE — Telephone Encounter (Signed)
Needs office visit if concerned. I have not seen this reaction with norvasc - so may be another issue. If SOB, swelling of face or throat or trouble swallowing need to seek emergency care.

## 2014-10-14 NOTE — Telephone Encounter (Signed)
Patient Name: Colleen [name] DOB: 07/10/1951 Initial Comment Caller states she was started on Norvask last Tuesday, she woke up this morning- face feels flushed. BP 138/84. Pulse 68. Nurse Assessment Nurse: Elijah Birkaldwell, RN, Lynda Date/Time (Eastern Time): 10/14/2014 10:56:15 AM Confirm and document reason for call. If symptomatic, describe symptoms. ---Caller states she was started on Norvasc last Tuesday, she woke up this morning- face feels flushed. BP 138/84. Pulse 68. Sometimes has dizziness after taking the med., but it goes away. Has the patient traveled out of the country within the last 30 days? ---Not Applicable Does the patient require triage? ---Yes Related visit to physician within the last 2 weeks? ---Yes Does the PT have any chronic conditions? (i.e. diabetes, asthma, etc.) ---Yes List chronic conditions. ---high BP, on thyroid med. Guidelines Guideline Title Affirmed Question Affirmed Notes Medication Question Call Caller has NON-URGENT medication question about med that PCP prescribed and triager unable to answer question Final Disposition User Call PCP within 24 Hours Nacoaldwell, RN, Stark BrayLynda Comments Caller states this symptom has been persisting for about 4 1/2 hours this am, was occurring prior to even taking her rx this am, before she got out of bed. States her face is red, along with neck & feels warm. Her other medication doses have not changed, this is the most recent rx she was put on. Advised to contact office if she does not hear back re: this side effect of medication. Nurse referenced [employer] & this is a less common side effect listed.

## 2014-10-16 ENCOUNTER — Emergency Department (HOSPITAL_COMMUNITY)
Admission: EM | Admit: 2014-10-16 | Discharge: 2014-10-16 | Disposition: A | Discharge status: Home / Self Care | Payer: BLUE CROSS/BLUE SHIELD | Attending: Emergency Medicine | Admitting: Emergency Medicine

## 2014-10-16 ENCOUNTER — Telehealth: Payer: Self-pay | Admitting: Family Medicine

## 2014-10-16 ENCOUNTER — Encounter (HOSPITAL_COMMUNITY): Payer: Self-pay | Admitting: Emergency Medicine

## 2014-10-16 DIAGNOSIS — Z79899 Other long term (current) drug therapy: Secondary | ICD-10-CM | POA: Diagnosis not present

## 2014-10-16 DIAGNOSIS — Z87891 Personal history of nicotine dependence: Secondary | ICD-10-CM | POA: Insufficient documentation

## 2014-10-16 DIAGNOSIS — T50905A Adverse effect of unspecified drugs, medicaments and biological substances, initial encounter: Secondary | ICD-10-CM

## 2014-10-16 DIAGNOSIS — F419 Anxiety disorder, unspecified: Secondary | ICD-10-CM | POA: Insufficient documentation

## 2014-10-16 DIAGNOSIS — I10 Essential (primary) hypertension: Secondary | ICD-10-CM | POA: Diagnosis not present

## 2014-10-16 DIAGNOSIS — Z8619 Personal history of other infectious and parasitic diseases: Secondary | ICD-10-CM | POA: Insufficient documentation

## 2014-10-16 DIAGNOSIS — F329 Major depressive disorder, single episode, unspecified: Secondary | ICD-10-CM | POA: Insufficient documentation

## 2014-10-16 DIAGNOSIS — R0989 Other specified symptoms and signs involving the circulatory and respiratory systems: Secondary | ICD-10-CM | POA: Insufficient documentation

## 2014-10-16 DIAGNOSIS — Z87440 Personal history of urinary (tract) infections: Secondary | ICD-10-CM | POA: Diagnosis not present

## 2014-10-16 DIAGNOSIS — J45909 Unspecified asthma, uncomplicated: Secondary | ICD-10-CM | POA: Diagnosis not present

## 2014-10-16 DIAGNOSIS — Z8719 Personal history of other diseases of the digestive system: Secondary | ICD-10-CM | POA: Insufficient documentation

## 2014-10-16 DIAGNOSIS — T461X5A Adverse effect of calcium-channel blockers, initial encounter: Secondary | ICD-10-CM | POA: Insufficient documentation

## 2014-10-16 HISTORY — DX: Essential (primary) hypertension: I10

## 2014-10-16 NOTE — ED Notes (Signed)
Pt alert, oriented, and ambulatory upon DC. She was advised to follow up with PCP tomorrow.

## 2014-10-16 NOTE — Telephone Encounter (Signed)
I called the pt and she stated she is very agitated, complains of headaches, flushing, feels irritable, tingling in her extremities and hands feel cold and she feels this is due to Amlodipine.  She questioned if she could be put on another beta-blocker as Dr Selena BattenKim had suggested before?  Please call pt.

## 2014-10-16 NOTE — ED Provider Notes (Signed)
CSN: [id number]     Arrival date & time 10/16/14  1421 History   First MD Initiated Contact with Patient 10/16/14 1508     Chief Complaint  Patient presents with  . Allergic Reaction     HPI   64 year old female presents today with foreign sensation in her throat. Patient reports that 8 days ago she was started on Norvasc and since then has been having facial flushing, tingling in her fingers, cold extremities, headache, and fatigue. Patient reports that she has contacted her primary care provider numerous times and reports that she wanted her to remain on the medication. Patient reports that the symptoms have continued but have not worsened. In addition to the above symptoms patient reports that she felt like her throat was "swollen" today. She reports the sensation has persisted but intensity has reduced. She denies shortness of breath, sore throat, difficulty swallowing, difficulty breathing, or any other concerns in addition to the sensation. Patient denies other systemic symptoms including rash/hives, swelling edema, SOB, or any other concerning signs or symptoms.    Past Medical History  Diagnosis Date  . Anxiety   . GERD (gastroesophageal reflux disease)   . Heartburn   . Seizures     MEDICATION INDUCED  . Family history of anesthesia complication     mother "nausea and vomiting post surger"  . Arrhythmia     "does not see cardiologist, sees pcp, Dr. Alonza SmokerJeff Todd  . Asthma     DX IN MARCH 2012 "allergy induced asthma"  . Bronchitis     hx of  . Urinary tract infection     hx of  . Herpes     on medication for outbreaks only  . Depression     "past hx of for Panic attacks, not on medication at this time"  . 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 Allergy   . Hypertension    Past Surgical History  Procedure Laterality Date  . Spinal cystectomy      "where pilonidal cyst was"  .  Knee arthroscopy      right  . Cesarean section      x 2  . Pilonidal cyst excision    . Elbow surgery      right  . Colonoscopy    . Thyroidectomy  04/07/2012    Procedure: THYROIDECTOMY;  Surgeon: Currie Parishristian J Streck, MD;  [facility];  Service: General;  Laterality: N/A;      Family History  Problem Relation Age of Onset  . Heart failure Father   . Hypertension Father   . Seizures Father   . Heart disease Father   . Seizures Brother   . Irritable bowel syndrome Brother   . Cirrhosis Brother   . Hypertension Brother     x 4  . Fibromyalgia Sister   . Atrial fibrillation Sister   . Prostate cancer Brother   . Cervical cancer Mother    History  Substance Use Topics  . Smoking status: Former Smoker    Quit date: 05/01/1999  . Smokeless tobacco: Never Used  . Alcohol Use: No   OB History    No data available     Review of Systems  All other systems reviewed and are negative.   Allergies  Cyclobenzaprine hcl  Home Medications   Prior to Admission medications   Medication Sig Start Date End Date Taking? Authorizing Provider  acetaminophen (TYLENOL) 500 MG tablet Take 1,000 mg by mouth daily as needed for moderate pain (shoulder & neck pain).   Yes Historical Provider, MD  amLODipine (NORVASC) 5 MG tablet Take 1 tablet (5 mg total) by mouth daily. 10/01/14  Yes Terressa Koyanagi, DO  diazepam (VALIUM) 2 MG tablet Take 1 tablet (2 mg total) by mouth daily as needed (Sleep). 09/05/14  Yes Roderick Pee, MD  diphenhydrAMINE (BENADRYL) 25 MG tablet Take 25 mg by mouth at bedtime.    Yes Historical Provider, MD  levothyroxine (SYNTHROID, LEVOTHROID) 100 MCG tablet Take 1 tablet (100 mcg total) by mouth daily. 05/31/14  Yes Roderick Pee, MD  Melatonin 1 MG TABS Take 1 tablet by mouth once.   Yes Historical Provider, MD  nadolol (CORGARD) 40 MG tablet Take 1 tablet (40 mg total) by mouth daily. Patient taking differently: Take 20 mg by mouth daily.  05/27/14  Yes Vesta Mixer, MD   BP 173/98 mmHg  Pulse 63  Temp(Src) 97.8 F (36.6 C) (Oral)  Resp 18  SpO2 100% Physical Exam  Constitutional: She is oriented to person, place, and time. She appears well-developed and well-nourished.  HENT:  Head: Normocephalic and atraumatic.  Mouth/Throat: Uvula is midline, oropharynx is clear and moist and mucous membranes are normal. No oropharyngeal exudate, posterior oropharyngeal edema, posterior oropharyngeal erythema or tonsillar abscesses.  No signs of swelling edema, erythema, foreign body  Eyes: Pupils are equal, round, and reactive to light.  Neck: Normal range of motion. Neck supple. No JVD present. No tracheal deviation present. No thyromegaly present.  Cardiovascular: Normal rate, regular rhythm, normal heart sounds and intact distal pulses.  Exam reveals no gallop and no friction rub.   No murmur heard. Pulmonary/Chest: Effort normal and breath sounds normal. No stridor. No respiratory distress. She has no wheezes. She has no rales. She exhibits no tenderness.  Musculoskeletal: Normal range of motion.  Lymphadenopathy:    She has no cervical adenopathy.  Neurological: She is alert and oriented to person, place, and time. Coordination normal.  Skin: Skin is warm and dry.  No abnormal skin changes including urticaria  Psychiatric: She has a normal mood and affect. Her behavior is normal. Judgment and thought content normal.  Nursing note and vitals reviewed.   ED Course  Procedures (including critical care time) Labs Review Labs Reviewed - No data to display  Imaging Review No results found.   EKG Interpretation None      MDM   Final diagnoses:  Medication side effect, initial encounter    Patient presentation likely well-known side effects of Norvasc medication. She reports she's been taking it for 8 days with the only addition of the throat sensation today. She reports that it is not as severe as was earlier, and reports more anxiety  about it than actual physical symptoms. She has a follow-up appointment with her primary care provider tomorrow and reports that she will be following up with him. Patient had no systemic symptoms or other signs of anaphylactic type reaction. Patient was advised to discontinue medication monitor for new or worsening signs or symptoms and return immediately if any present. She understood and agreed to this plan.    Eyvonne Mechanic, PA-C 10/17/14 5621  Raeford Razor, MD 10/17/14 2020

## 2014-10-16 NOTE — Telephone Encounter (Signed)
Pt would like a cb. She has questions concerning her amLODipine (NORVASC) 5 MG tablet dr Selena Battenkim put her on

## 2014-10-16 NOTE — Discharge Instructions (Signed)
Please follow-up with your primary care provider as previously scheduled. Monitor for worsening signs or symptoms as indicated in the reference guide attached. Please return to the emergency room immediately if any of those signs or symptoms present. Your presentation does not likely represent an anaphylactic reaction.

## 2014-10-16 NOTE — Telephone Encounter (Signed)
I called the pt and scheduled an appt for tomorrow at 11am. 

## 2014-10-16 NOTE — ED Notes (Signed)
Per pt, states has been on Norvasc 5 mg for 7 days-states for the past couple of days she has been flush and today she feels like throat is closing-informed PCP and they said it was not due to Norvasc

## 2014-10-16 NOTE — Telephone Encounter (Signed)
Can you help her get appointment for tomorrow. Thanks.

## 2014-10-17 ENCOUNTER — Ambulatory Visit (INDEPENDENT_AMBULATORY_CARE_PROVIDER_SITE_OTHER): Payer: BLUE CROSS/BLUE SHIELD | Admitting: Family Medicine

## 2014-10-17 ENCOUNTER — Encounter: Payer: Self-pay | Admitting: Family Medicine

## 2014-10-17 VITALS — BP 128/78 | HR 56 | Temp 97.5°F | Ht 65.0 in | Wt 161.0 lb

## 2014-10-17 DIAGNOSIS — T887XXA Unspecified adverse effect of drug or medicament, initial encounter: Secondary | ICD-10-CM

## 2014-10-17 DIAGNOSIS — I10 Essential (primary) hypertension: Secondary | ICD-10-CM

## 2014-10-17 DIAGNOSIS — T50905A Adverse effect of unspecified drugs, medicaments and biological substances, initial encounter: Secondary | ICD-10-CM

## 2014-10-17 NOTE — Progress Notes (Signed)
HPI:  HTN/Reaction to medication: -meds: taking nadolol 40 mg (1/2 daily) daily - she did not tolerate hctz, and now after starting norvasc had flushing of face, tingling sensation in skin, sore throat and stopped this medication -denies: CP, SOB, DOE, swelling, palpations  ROS: See pertinent positives and negatives per HPI.  Past Medical History  Diagnosis Date  . Anxiety   . GERD (gastroesophageal reflux disease)   . Heartburn   . Seizures     MEDICATION INDUCED  . Family history of anesthesia complication     mother "nausea and vomiting post surger"  . Arrhythmia     "does not see cardiologist, sees pcp, Dr. Alonza Smoker  . Asthma     DX IN MARCH 2012 "allergy induced asthma"  . Bronchitis     hx of  . Urinary tract infection     hx of  . Herpes     on medication for outbreaks only  . Depression     "past hx of for Panic attacks, not on medication at this time"  . 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 Allergy   . Hypertension     Past Surgical History  Procedure Laterality Date  . Spinal cystectomy      "where pilonidal cyst was"  . Knee arthroscopy      right  . Cesarean section      x 2  . Pilonidal cyst excision    . Elbow surgery      right  . Colonoscopy    . Thyroidectomy  04/07/2012    Procedure: THYROIDECTOMY;  Surgeon: Currie Paris, MD;  Location: [facility] OR;  Service: General;  Laterality: N/A;       Family History  Problem Relation Age of Onset  . Heart failure Father   . Hypertension Father   . Seizures Father   . Heart disease Father   . Seizures Brother   . Irritable bowel syndrome Brother   . Cirrhosis Brother   . Hypertension Brother     x 4  . Fibromyalgia Sister   . Atrial fibrillation Sister   . Prostate cancer Brother   . Cervical cancer Mother     History   Social History  . Marital Status: Married    Spouse Name: N/A  . Number  of Children: 2  . Years of Education: N/A   Occupational History  . [employer]    Social History Main Topics  . Smoking status: Former Smoker    Quit date: 05/01/1999  . Smokeless tobacco: Never Used  . Alcohol Use: No  . Drug Use: No  . Sexual Activity:    Partners: Male   Other Topics Concern  . None   Social History Narrative     Current outpatient prescriptions:  .  acetaminophen (TYLENOL) 500 MG tablet, Take 1,000 mg by mouth daily as needed for moderate pain (shoulder & neck pain)., Disp: , Rfl:  .  diazepam (VALIUM) 2 MG tablet, Take 1 tablet (2 mg total) by mouth daily as needed (Sleep)., Disp: 90 tablet, Rfl: 1 .  diphenhydrAMINE (BENADRYL) 25 MG tablet, Take 25 mg by mouth at bedtime. , Disp: , Rfl:  .  levothyroxine (SYNTHROID, LEVOTHROID) 100 MCG tablet, Take 1 tablet (100 mcg total) by mouth daily., Disp: 90 tablet, Rfl: 3 .  Melatonin 1 MG TABS, Take 1 tablet by mouth once., Disp: ,  Rfl:  .  nadolol (CORGARD) 40 MG tablet, Take 1 tablet (40 mg total) by mouth daily. (Patient taking differently: Take 20 mg by mouth daily. ), Disp: , Rfl:   EXAM:  Filed Vitals:   10/17/14 1111  BP: 128/78  Pulse: 56  Temp: 97.5 F (36.4 C)    Body mass index is 26.79 kg/(m^2).  GENERAL: vitals reviewed and listed above, alert, oriented, appears well hydrated and in no acute distress  HEENT: atraumatic, conjunttiva clear, no obvious abnormalities on inspection of external nose and ears  NECK: no obvious masses on inspection  LUNGS: clear to auscultation bilaterally, no wheezes, rales or rhonchi, good air movement  CV: HRRR, no peripheral edema  MS: moves all extremities without noticeable abnormality  PSYCH: pleasant and cooperative, no obvious depression or anxiety  ASSESSMENT AND PLAN:  Discussed the following assessment and plan:  Essential hypertension  Medication reaction, initial encounter  -Patient advised to return or notify a  doctor immediately if symptoms worsen or persist or new concerns arise.  Patient Instructions  -If needed we can increase the nadolol to 40mg    We recommend the following healthy lifestyle measures: - eat a healthy diet consisting of lots of vegetables, fruits, beans, nuts, seeds, healthy meats such as white chicken and fish and whole grains.  - avoid fried foods, fast food, processed foods, sodas, red meet and other fattening foods.  - get a least 150 minutes of aerobic exercise per week.   -check blood pressure 3 times per week at different times of the day - after sitting for five minutes with feet flat on the floor, arm in a rest position on table and not talking. Keep a written log. If on average is running about 140 on the top or above 90 on the bottom try increasing the nadolol.  Follow up in 2-3 months     KIM, Colleen R.

## 2014-10-17 NOTE — Progress Notes (Signed)
Pre visit review using our clinic review tool, if applicable. No additional management support is needed unless otherwise documented below in the visit note. 

## 2014-10-17 NOTE — Patient Instructions (Addendum)
-  If needed we can increase the nadolol to 40mg    We recommend the following healthy lifestyle measures: - eat a healthy diet consisting of lots of vegetables, fruits, beans, nuts, seeds, healthy meats such as white chicken and fish and whole grains.  - avoid fried foods, fast food, processed foods, sodas, red meet and other fattening foods.  - get a least 150 minutes of aerobic exercise per week.   -check blood pressure 3 times per week at different times of the day - after sitting for five minutes with feet flat on the floor, arm in a rest position on table and not talking. Keep a written log. If on average is running about 140 on the top or above 90 on the bottom try increasing the nadolol.  Follow up in 2-3 months

## 2014-11-27 ENCOUNTER — Other Ambulatory Visit: Payer: Self-pay | Admitting: Family Medicine

## 2014-12-02 ENCOUNTER — Telehealth: Payer: Self-pay | Admitting: Endocrinology

## 2014-12-02 NOTE — Telephone Encounter (Signed)
LM for pt to call back so we can schedule her with Dr. Lucianne MussKumar as a new pt.

## 2014-12-02 NOTE — Telephone Encounter (Signed)
-----   Message from Reather LittlerAjay Kumar, MD sent at 11/29/2014  9:36 AM EDT ----- Regarding: RE: Chart review Okay with me ----- Message -----    From: Sherlyn HayStephanie L Vaughn    Sent: 11/21/2014   3:57 PM      To: Romero BellingSean Ellison, MD, Reather LittlerAjay Kumar, MD, # Subject: Chart review                                   Can you please review the pt's chart and let me know if we can see her. He is self referred but Dr. Tawanna Coolerodd is her PCP

## 2014-12-10 ENCOUNTER — Ambulatory Visit: Payer: BLUE CROSS/BLUE SHIELD | Admitting: Family Medicine

## 2014-12-19 ENCOUNTER — Encounter: Payer: Self-pay | Admitting: Internal Medicine

## 2014-12-21 ENCOUNTER — Encounter (HOSPITAL_COMMUNITY): Payer: Self-pay | Admitting: Emergency Medicine

## 2014-12-21 ENCOUNTER — Emergency Department (HOSPITAL_COMMUNITY)
Admission: EM | Admit: 2014-12-21 | Discharge: 2014-12-21 | Disposition: A | Discharge status: Home / Self Care | Payer: BLUE CROSS/BLUE SHIELD | Attending: Emergency Medicine | Admitting: Emergency Medicine

## 2014-12-21 DIAGNOSIS — E892 Postprocedural hypoparathyroidism: Secondary | ICD-10-CM | POA: Insufficient documentation

## 2014-12-21 DIAGNOSIS — Z87891 Personal history of nicotine dependence: Secondary | ICD-10-CM | POA: Insufficient documentation

## 2014-12-21 DIAGNOSIS — G253 Myoclonus: Secondary | ICD-10-CM | POA: Diagnosis not present

## 2014-12-21 DIAGNOSIS — F419 Anxiety disorder, unspecified: Secondary | ICD-10-CM | POA: Diagnosis not present

## 2014-12-21 DIAGNOSIS — J45909 Unspecified asthma, uncomplicated: Secondary | ICD-10-CM | POA: Insufficient documentation

## 2014-12-21 DIAGNOSIS — Z8619 Personal history of other infectious and parasitic diseases: Secondary | ICD-10-CM | POA: Insufficient documentation

## 2014-12-21 DIAGNOSIS — Z87440 Personal history of urinary (tract) infections: Secondary | ICD-10-CM | POA: Diagnosis not present

## 2014-12-21 DIAGNOSIS — Z8719 Personal history of other diseases of the digestive system: Secondary | ICD-10-CM | POA: Insufficient documentation

## 2014-12-21 DIAGNOSIS — R51 Headache: Secondary | ICD-10-CM | POA: Insufficient documentation

## 2014-12-21 DIAGNOSIS — R0789 Other chest pain: Secondary | ICD-10-CM | POA: Diagnosis not present

## 2014-12-21 DIAGNOSIS — I10 Essential (primary) hypertension: Secondary | ICD-10-CM | POA: Insufficient documentation

## 2014-12-21 DIAGNOSIS — T464X5A Adverse effect of angiotensin-converting-enzyme inhibitors, initial encounter: Secondary | ICD-10-CM | POA: Diagnosis not present

## 2014-12-21 DIAGNOSIS — Z79899 Other long term (current) drug therapy: Secondary | ICD-10-CM | POA: Diagnosis not present

## 2014-12-21 DIAGNOSIS — R110 Nausea: Secondary | ICD-10-CM | POA: Insufficient documentation

## 2014-12-21 LAB — COMPREHENSIVE METABOLIC PANEL
ALT: 24 U/L (ref 14–54)
AST: 23 U/L (ref 15–41)
Albumin: 4 g/dL (ref 3.5–5.0)
Alkaline Phosphatase: 52 U/L (ref 38–126)
Anion gap: 4 — ABNORMAL LOW (ref 5–15)
BUN: 7 mg/dL (ref 6–20)
CO2: 27 mmol/L (ref 22–32)
Calcium: 8.8 mg/dL — ABNORMAL LOW (ref 8.9–10.3)
Chloride: 106 mmol/L (ref 101–111)
Creatinine, Ser: 0.97 mg/dL (ref 0.44–1.00)
GFR calc Af Amer: >60 mL/min (ref >60)
GFR calc non Af Amer: >60 mL/min (ref >60)
Glucose, Bld: 107 mg/dL — ABNORMAL HIGH (ref 65–99)
POTASSIUM: 4.2 mmol/L (ref 3.5–5.1)
SODIUM: 137 mmol/L (ref 135–145)
Total Bilirubin: 0.5 mg/dL (ref 0.3–1.2)
Total Protein: 6.9 g/dL (ref 6.5–8.1)

## 2014-12-21 LAB — CBC WITH DIFFERENTIAL/PLATELET
BASOS ABS: 0 10*3/uL (ref 0.0–0.1)
Basophils Relative: 0 % (ref 0–1)
EOS ABS: 0.1 10*3/uL (ref 0.0–0.7)
Eosinophils Relative: 2 % (ref 0–5)
HCT: 33.2 % — ABNORMAL LOW (ref 36.0–46.0)
Hemoglobin: 11.4 g/dL — ABNORMAL LOW (ref 12.0–15.0)
LYMPHS PCT: 28 % (ref 12–46)
Lymphs Abs: 1.1 10*3/uL (ref 0.7–4.0)
MCH: 32.5 pg (ref 26.0–34.0)
MCHC: 34.3 g/dL (ref 30.0–36.0)
MCV: 94.6 fL (ref 78.0–100.0)
MONOS PCT: 8 % (ref 3–12)
Monocytes Absolute: 0.3 10*3/uL (ref 0.1–1.0)
Neutro Abs: 2.3 10*3/uL (ref 1.7–7.7)
Neutrophils Relative %: 62 % (ref 43–77)
PLATELETS: 228 10*3/uL (ref 150–400)
RBC: 3.51 MIL/uL — AB (ref 3.87–5.11)
RDW: 12.5 % (ref 11.5–15.5)
WBC: 3.8 10*3/uL — AB (ref 4.0–10.5)

## 2014-12-21 LAB — MAGNESIUM: Magnesium: 1.9 mg/dL (ref 1.7–2.4)

## 2014-12-21 MED ORDER — SODIUM CHLORIDE 0.9 % IV BOLUS (SEPSIS)
1000.0000 mL | Freq: Once | INTRAVENOUS | Status: AC
  Administered 2014-12-21: 1000 mL via INTRAVENOUS

## 2014-12-21 MED ORDER — DIAZEPAM 5 MG/ML IJ SOLN
5.0000 mg | Freq: Once | INTRAMUSCULAR | Status: AC
  Administered 2014-12-21: 5 mg via INTRAVENOUS
  Filled 2014-12-21: qty 2

## 2014-12-21 NOTE — ED Provider Notes (Signed)
CSN: [id number]     Arrival date & time 12/21/14  1452 History   First MD Initiated Contact with Patient 12/21/14 1504     Chief Complaint  Patient presents with  . Medication Reaction     (Consider location/radiation/quality/duration/timing/severity/associated sxs/prior Treatment) HPI  64 year old female presents with multiple spasms. The patient has had the spasm before and when she called her daughter her daughter thought she might be having a stroke so EMS was called. The patient states she has been talking slower but has not had slurred words is not having trouble getting her words out. There's been no focal weakness or numbness but the patient feels like the muscle spasms are coming from the right side. Because her whole body does seem to contract and cause chest tightness and pain. The patient is concerned it is a medication reaction. She was recently put on lisinopril 1 month ago and has had a mild cough since but is unsure of this spasm is related to the lisinopril. However she also notes an exact similar episode 8 months ago where she was evaluated by neurologist. She had a negative EEG while having these episodes. The patient states she is mostly tired of having these and wants everything figured out. Questions whether or not it is related to her Synthroid as she has had a lot of problems with thyroid medicine in the past.  Past Medical History  Diagnosis Date  . Anxiety   . GERD (gastroesophageal reflux disease)   . Heartburn   . Seizures     MEDICATION INDUCED  . Family history of anesthesia complication     mother "nausea and vomiting post surger"  . Arrhythmia     "does not see cardiologist, sees pcp, Dr. Alonza Smoker  . Asthma     DX IN MARCH 2012 "allergy induced asthma"  . Bronchitis     hx of  . Urinary tract infection     hx of  . Herpes     on medication for outbreaks only  . Depression     "past hx of for Panic attacks, not on medication at this time"  . 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 Allergy   . Hypertension    Past Surgical History  Procedure Laterality Date  . Spinal cystectomy      "where pilonidal cyst was"  . Knee arthroscopy      right  . Cesarean section      x 2  . Pilonidal cyst excision    . Elbow surgery      right  . Colonoscopy    . Thyroidectomy  04/07/2012    Procedure: THYROIDECTOMY;  Surgeon: Currie Paris, MD;  Location: [facility] OR;  Service: General;  Laterality: N/A;      Family History  Problem Relation Age of Onset  . Heart failure Father   . Hypertension Father   . Seizures Father   . Heart disease Father   . Seizures Brother   . Irritable bowel syndrome Brother   . Cirrhosis Brother   . Hypertension Brother     x 4  . Fibromyalgia Sister   . Atrial fibrillation Sister   . Prostate cancer Brother   . Cervical cancer Mother    History  Substance Use Topics  . Smoking status: Former Smoker    Quit date: 05/01/1999  . Smokeless tobacco: Never Used  .  Alcohol Use: No   OB History    No data available     Review of Systems  Constitutional: Negative for fever.  Cardiovascular: Positive for chest pain.  Gastrointestinal: Positive for nausea. Negative for vomiting.  Genitourinary: Negative for dysuria.  Musculoskeletal: Positive for myalgias.  Neurological: Positive for headaches.  All other systems reviewed and are negative.     Allergies  Cyclobenzaprine hcl and Norvasc  Home Medications   Prior to Admission medications   Medication Sig Start Date End Date Taking? Authorizing Provider  acetaminophen (TYLENOL) 500 MG tablet Take 1,000 mg by mouth daily as needed for moderate pain (shoulder & neck pain).    Historical Provider, MD  diazepam (VALIUM) 2 MG tablet Take 1 tablet (2 mg total) by mouth daily as needed (Sleep). 09/05/14   Roderick Pee, MD  diphenhydrAMINE (BENADRYL) 25 MG tablet  Take 25 mg by mouth at bedtime.     Historical Provider, MD  levothyroxine (SYNTHROID, LEVOTHROID) 100 MCG tablet Take 1 tablet (100 mcg total) by mouth daily. 05/31/14   Roderick Pee, MD  Melatonin 1 MG TABS Take 1 tablet by mouth once.    Historical Provider, MD  nadolol (CORGARD) 40 MG tablet Take 1 tablet (40 mg total) by mouth daily. Patient taking differently: Take 20 mg by mouth daily.  05/27/14   Vesta Mixer, MD  nadolol (CORGARD) 40 MG tablet TAKE ONE-HALF (1/2) TABLET (20 MG) DAILY 11/27/14   Roderick Pee, MD   SpO2 96% Physical Exam  Constitutional: She is oriented to person, place, and time. She appears well-developed and well-nourished. No distress.  HENT:  Head: Normocephalic and atraumatic.  Right Ear: External ear normal.  Left Ear: External ear normal.  Nose: Nose normal.  Eyes: EOM are normal. Pupils are equal, round, and reactive to light. Right eye exhibits no discharge. Left eye exhibits no discharge.  Neck: Neck supple.  Cardiovascular: Normal rate, regular rhythm and normal heart sounds.   Pulmonary/Chest: Effort normal and breath sounds normal. She has no wheezes.  Abdominal: Soft. She exhibits no distension. There is no tenderness.  Musculoskeletal:  Occasional brief spasms of her upper body lasting a few seconds. Appears in pain while this occurs but remains awake and alert. No tonic-clonic activity  Neurological: She is alert and oriented to person, place, and time. She displays no tremor.  CN 2-12 grossly intact. 5/5 strength in all 4 extremities. Grossly normal sensation. Slowed speech but her words and speech are otherwise normal.   Skin: Skin is warm and dry. She is not diaphoretic.  Nursing note and vitals reviewed.   ED Course  Procedures (including critical care time) Labs Review Labs Reviewed  COMPREHENSIVE METABOLIC PANEL - Abnormal; Notable for the following:    Glucose, Bld 107 (*)    Calcium 8.8 (*)    Anion gap 4 (*)    All other  components within normal limits  CBC WITH DIFFERENTIAL/PLATELET - Abnormal; Notable for the following:    WBC 3.8 (*)    RBC 3.51 (*)    Hemoglobin 11.4 (*)    HCT 33.2 (*)    All other components within normal limits  MAGNESIUM    Imaging Review No results found.   EKG Interpretation   Date/Time:  Saturday December 21 2014 16:04:17 EDT Ventricular Rate:  64 PR Interval:  155 QRS Duration: 83 QT Interval:  421 QTC Calculation: 434 R Axis:   44 Text Interpretation:  Incomplete analysis due  to missing data in  precordial lead(s) Sinus rhythm Atrial premature complex Abnormal R-wave  progression, early transition Missing lead(s): V3 besides limitations as  above, no significant change compared to Oct 2015 Confirmed by Cheynne Virden   MD, Amandeep Hogston [phone]) on 12/21/2014 4:49:03 PM      MDM   Final diagnoses:  Myoclonic jerking    A review of her neurology note from several months ago shows that she is having an almost exact repeat episode as what they saw. The patient has no acute neurologic dysfunction and while she is speaking slowly there is no slurred speech, trouble with words, or signs of stroke. She has had a negative EEG while having these episodes and thus seizure is very unlikely. No significant electrolyte imbalance that would cause the symptoms. After being given IV Valium her symptoms seem to have resolved. The patient does have oral Valium at home, I recommended she use this as needed (hasn't been taking at home). She questions whether or not this is lisinopril but given that she had symptoms similar to this prior to being on lisinopril I really doubt this. She is asking if changing her meds would help but from her medicine list do not see anything obvious. I recommended she follow-up closely with her PCP for possible medication adjustment and further care.    Pricilla Loveless, MD 12/21/14 1726

## 2014-12-21 NOTE — Discharge Instructions (Signed)
Myoclonus Myoclonus is a term that refers to brief, involuntary twitching or jerking of a muscle or a group of muscles. It describes a symptom, and generally, is not a diagnosis of a disease. The myoclonic twitches or jerks are usually caused by sudden muscle contractions. They also can result from brief lapses of contraction. Myoclonic twitches or jerks may occur:  Alone or in sequence.  In a pattern or without pattern.  Infrequently or many times each minute. Often times, myoclonus is one of several symptoms in a wide variety of nervous system disorders such as:  Multiple sclerosis.  Parkinson's disease.  Alzheimer's disease.  Creutzfeldt-Jakob disease. Familiar examples of normal myoclonus include:  Hiccups and jerks.  "Sleep starts" that some people have while drifting off to sleep. Severe cases can severely limit a person's ability to:  Eat.  Talk.  Walk. Myoclonic jerks commonly occur in individuals with epilepsy. The most common types of myoclonus include:  Action.  Cortical reflex.  Essential.  Palatal.  Progressive myoclonus epilepsy.  Reticular reflex.  Sleep.  Stimulus-sensitive. TREATMENT  Treatment for myoclonus consists of medicines that may help reduce symptoms. These drugs (many of which are also used to treat epilepsy) include:   Barbiturates.  Clonazepam.  Phenytoin.  Primidone.  Sodium valproate. The complex origins of myoclonus may require the use of multiple drugs. Document Released: 06/18/2002 Document Revised: 09/20/2011 Document Reviewed: 05/31/2013 [facility] Patient Information 2015 [place], [place]. This information is not intended to replace advice given to you by your health care provider. Make sure you discuss any questions you have with your health care provider.     Muscle Cramps and Spasms Muscle cramps and spasms occur when a muscle or muscles tighten and you have no control over this tightening (involuntary muscle  contraction). They are a common problem and can develop in any muscle. The most common place is in the calf muscles of the leg. Both muscle cramps and muscle spasms are involuntary muscle contractions, but they also have differences:   Muscle cramps are sporadic and painful. They may last a few seconds to a quarter of an hour. Muscle cramps are often more forceful and last longer than muscle spasms.  Muscle spasms may or may not be painful. They may also last just a few seconds or much longer. CAUSES  It is uncommon for cramps or spasms to be due to a serious underlying problem. In many cases, the cause of cramps or spasms is unknown. Some common causes are:   Overexertion.   Overuse from repetitive motions (doing the same thing over and over).   Remaining in a certain position for a long period of time.   Improper preparation, form, or technique while performing a sport or activity.   Dehydration.   Injury.   Side effects of some medicines.   Abnormally low levels of the salts and ions in your blood (electrolytes), especially potassium and calcium. This could happen if you are taking water pills (diuretics) or you are pregnant.  Some underlying medical problems can make it more likely to develop cramps or spasms. These include, but are not limited to:   Diabetes.   Parkinson disease.   Hormone disorders, such as thyroid problems.   Alcohol abuse.   Diseases specific to muscles, joints, and bones.   Blood vessel disease where not enough blood is getting to the muscles.  HOME CARE INSTRUCTIONS   Stay well hydrated. Drink enough water and fluids to keep your urine clear or pale  yellow.  It may be helpful to massage, stretch, and relax the affected muscle.  For tight or tense muscles, use a warm towel, heating pad, or hot shower water directed to the affected area.  If you are sore or have pain after a cramp or spasm, applying ice to the affected area may  relieve discomfort.  Put ice in a plastic bag.  Place a towel between your skin and the bag.  Leave the ice on for 15-20 minutes, 03-04 times a day.  Medicines used to treat a known cause of cramps or spasms may help reduce their frequency or severity. Only take over-the-counter or prescription medicines as directed by your caregiver. SEEK MEDICAL CARE IF:  Your cramps or spasms get more severe, more frequent, or do not improve over time.  MAKE SURE YOU:   Understand these instructions.  Will watch your condition.  Will get help right away if you are not doing well or get worse. Document Released: 12/18/2001 Document Revised: 10/23/2012 Document Reviewed: 06/14/2012 [facility] Patient Information 2015 [place], [place]. This information is not intended to replace advice given to you by your health care provider. Make sure you discuss any questions you have with your health care provider.

## 2014-12-21 NOTE — ED Notes (Addendum)
PER EMS- pt picked up from home c/o possible medication reaction.  Initially EMS called out for stroke symptoms which changed to seizures, then pt started reporting twitching and contractions in arms and neck. Stated this happen back in oct when pt's thyroid medication dose was altered and started new medication.  Pt reports that she was seen by PCP within last 4 months and reported "levels are good". Arrived to ED alert and oriented per baseline.  Pt switches back and forth between two thyroid medications

## 2014-12-21 NOTE — ED Notes (Signed)
Bed: WA20 Expected date:  Expected time:  Means of arrival:  Comments: ems 

## 2015-01-21 ENCOUNTER — Ambulatory Visit: Payer: BLUE CROSS/BLUE SHIELD | Admitting: Endocrinology

## 2015-01-27 ENCOUNTER — Ambulatory Visit: Payer: BLUE CROSS/BLUE SHIELD | Admitting: Endocrinology

## 2015-03-24 ENCOUNTER — Ambulatory Visit: Payer: BLUE CROSS/BLUE SHIELD | Admitting: Neurology

## 2015-03-27 ENCOUNTER — Ambulatory Visit (INDEPENDENT_AMBULATORY_CARE_PROVIDER_SITE_OTHER): Payer: BLUE CROSS/BLUE SHIELD | Admitting: Neurology

## 2015-03-27 ENCOUNTER — Encounter: Payer: Self-pay | Admitting: Neurology

## 2015-03-27 VITALS — BP 124/92 | HR 59 | Resp 16 | Wt 159.0 lb

## 2015-03-27 DIAGNOSIS — R4789 Other speech disturbances: Secondary | ICD-10-CM

## 2015-03-27 DIAGNOSIS — R259 Unspecified abnormal involuntary movements: Secondary | ICD-10-CM

## 2015-03-27 NOTE — Patient Instructions (Addendum)
1. Schedule 24-hour EEG 2. Try Melatonin  daily 3. As per [place] driving laws, after an episode of loss of consciousness or awareness, one should not drive until 6 months event-free 4. Follow-up after EEG

## 2015-03-27 NOTE — Progress Notes (Signed)
NEUROLOGY CONSULTATION NOTE  Colleen [name] MRN: [id number] DOB: May 27, 1951  Referring provider: Dr. Shirlean Mylar Primary care provider: Dr. Shirlean Mylar  Reason for consult:  Body spasms, change in cognition  Dear Dr Hyman Hopes:  This is regarding Colleen [name] who was seen in the neurology clinic for recurrence of body spasms and cognitive changes that started in 2015. She was previously seen by one of my colleagues, Dr. Everlena Cooper. Records were reviewed and will be summarized below. She is a 64 year old left-handed woman with a history of acute thyroiditis status post thyroidectomy, asthma, depression, anxiety, medication-induced seizure, GERD and goiter, in her usual state of health until 04/2014 when she started having episodes of sudden diffuse weakness and fatigue, with a sensation she would pass out. This would last for hours, she would feel that she could not focus. No vision changes, she reports her head would flex forward because she could not lift it up, she would not process speech but could hear people around her. Later on, this was followed by muscles spasms, more on the right side. It would start in her back, with full contraction forward, she describes her right arm flexed and right leg curled in. She also started having intermittent chest pain and mild shortness of breath because of the muscle spasms. She denied any pain or weakness in her extremities after the episodes, but did feel numbness and tingling. She had an MRI brain with and without contrast which was normal. She had a typical spell in the office at that time, routine EEG captured muscle jerking without electrographic correlate. There were some rare sharply contoured alpha waves without clear epileptiform morphology seen in the left temporal lobe which did not correlate with any of her movements, but there were no epileptiform discharges or seizure activity seen. Essentially the EEG was within normal limits. She became  symptom-free for 8 months, stating that throughout those months, she was working with her doctors trying to figure out the best BP medication for her. She started Lisinopril in May. A month later, she went to the ER on 12/21/14 for recurrence of body spasms from the right side. She was out shopping, then had the acute fatigue come over her. She was able to go home and lie down, when the spasms and contractions started. She felt she was losing focus but was able to call her daughter, getting half-words out, right arm and leg were curled in. She was given IV valium. She denied any headaches. She felt this may have been related to Lisinopril. She was switched from Lisinopril to Losartan 22 days ago. She had recurrence of symptoms last 03/20/15 while at work, she started losing focus but states she had no acute fatigue. She tried to control it, talked to her manager, then started having the contractions/spasms on the right side. She could hear but could not talk. This lasted a few minutes, the entire episode lasted 1-1/2 hours. She went home and took Valium and went to sleep. The last episode occurred 03/22/15 at home, she again knew what was happening, drove herself to Urgent Care feeling fatigued, anxious and scared. She had to concentrate while walking and spoke slowly to the physician.   She has been having sleep difficulties and used to take Benadryl. She was started on Valium 2mg  qhs 3 years ago for sleep. She feels it is not helpful but takes it 4 out of 7 nights. She feels more anxious at night, sometimes taking 4mg , but  states she did not stop doses prior to these events, but did get less sleep than usual. She usually gets 5 hours of sleep. No alcohol use. She denies any olfactory/gustatory hallucinations, deja vu, rising epigastric sensation. She has been dealing with sinus headaches. No dizziness, diplopia, dysarthria/dysphagia, neck/back pain, bowel/bladder dysfunction. She has a history of panic attacks and  depression. Her father and brother had epilepsy diagnosed in their younger years. Otherwise, she had a normal birth and early development.  There is no history of febrile convulsions, CNS infections such as meningitis/encephalitis, significant traumatic brain injury, neurosurgical procedures.   PAST MEDICAL HISTORY: Past Medical History  Diagnosis Date  . Anxiety   . GERD (gastroesophageal reflux disease)   . Heartburn   . Seizures     MEDICATION INDUCED  . Family history of anesthesia complication     mother "nausea and vomiting post surger"  . Arrhythmia     "does not see cardiologist, sees pcp, Dr. Alonza Smoker  . Asthma     DX IN MARCH 2012 "allergy induced asthma"  . Bronchitis     hx of  . Urinary tract infection     hx of  . Herpes     on medication for outbreaks only  . Depression     "past hx of for Panic attacks, not on medication at this time"  . 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 Allergy   . Hypertension     PAST SURGICAL HISTORY: Past Surgical History  Procedure Laterality Date  . Spinal cystectomy      "where pilonidal cyst was"  . Knee arthroscopy      right  . Cesarean section      x 2  . Pilonidal cyst excision    . Elbow surgery      right  . Colonoscopy    . Thyroidectomy  04/07/2012    Procedure: THYROIDECTOMY;  Surgeon: Currie Paris, MD;  Location: [facility] OR;  Service: General;  Laterality: N/A;       MEDICATIONS: Current Outpatient Prescriptions on File Prior to Visit  Medication Sig Dispense Refill  . acetaminophen (TYLENOL) 500 MG tablet Take 1,000 mg by mouth 2 (two) times daily.     . diazepam (VALIUM) 2 MG tablet Take 1 tablet (2 mg total) by mouth daily as needed (Sleep). 90 tablet 1  . levothyroxine (SYNTHROID, LEVOTHROID) 100 MCG tablet Take 1 tablet (100 mcg total) by mouth daily. 90 tablet 3  . Melatonin 3 MG TABS Take 3 mg by mouth at  bedtime.    . nadolol (CORGARD) 40 MG tablet TAKE ONE-HALF (1/2) TABLET (20 MG) DAILY 90 tablet 0   No current facility-administered medications on file prior to visit.    ALLERGIES: Allergies  Allergen Reactions  . Cyclobenzaprine Hcl Other (See Comments)    REACTION: seizure  . Norvasc [Amlodipine Besylate] Other (See Comments)    Flushing, tingling in face, sore throat    FAMILY HISTORY: Family History  Problem Relation Age of Onset  . Heart failure Father   . Hypertension Father   . Seizures Father   . Heart disease Father   . Seizures Brother   . Irritable bowel syndrome Brother   . Cirrhosis Brother   . Hypertension Brother     x 4  . Fibromyalgia Sister   . Atrial fibrillation Sister   . Prostate cancer Brother   .  Cervical cancer Mother     SOCIAL HISTORY: Social History   Social History  . Marital Status: Married    Spouse Name: N/A  . Number of Children: 2  . Years of Education: N/A   Occupational History  . [employer]    Social History Main Topics  . Smoking status: Former Smoker    Quit date: 05/01/1999  . Smokeless tobacco: Never Used  . Alcohol Use: No  . Drug Use: No  . Sexual Activity:    Partners: Male   Other Topics Concern  . Not on file   Social History Narrative    REVIEW OF SYSTEMS: Constitutional: No fevers, chills, or sweats, no generalized fatigue, change in appetite Eyes: No visual changes, double vision, eye pain Ear, nose and throat: No hearing loss, ear pain, nasal congestion, sore throat Cardiovascular: No chest pain, palpitations Respiratory:  No shortness of breath at rest or with exertion, wheezes GastrointestinaI: No nausea, vomiting, diarrhea, abdominal pain, fecal incontinence Genitourinary:  No dysuria, urinary retention or frequency Musculoskeletal:  No neck pain, back pain Integumentary: No rash, pruritus, skin lesions Neurological: as above Psychiatric: No depression, insomnia,  anxiety Endocrine: No palpitations, fatigue, diaphoresis, mood swings, change in appetite, change in weight, increased thirst Hematologic/Lymphatic:  No anemia, purpura, petechiae. Allergic/Immunologic: no itchy/runny eyes, nasal congestion, recent allergic reactions, rashes  PHYSICAL EXAM: Filed Vitals:   03/27/15 0931  BP: 124/92  Pulse: 59  Resp: 16   General: No acute distress Head:  Normocephalic/atraumatic Eyes: Fundoscopic exam shows bilateral sharp discs, no vessel changes, exudates, or hemorrhages Neck: supple, no paraspinal tenderness, full range of motion Back: No paraspinal tenderness Heart: regular rate and rhythm Lungs: Clear to auscultation bilaterally. Vascular: No carotid bruits. Skin/Extremities: No rash, no edema Neurological Exam: Mental status: alert and oriented to person, place, and time, no dysarthria or aphasia, Fund of knowledge is appropriate.  Recent and remote memory are intact.  Attention and concentration are normal.    Able to name objects and repeat phrases. Cranial nerves: CN I: not tested CN II: pupils equal, round and reactive to light, visual fields intact, fundi unremarkable. CN III, IV, VI:  full range of motion, no nystagmus, no ptosis CN V: facial sensation intact CN VII: upper and lower face symmetric CN VIII: hearing intact to finger rub CN IX, X: gag intact, uvula midline CN XI: sternocleidomastoid and trapezius muscles intact CN XII: tongue midline Bulk & Tone: normal, no fasciculations. Motor: 5/5 throughout with no pronator drift. Sensation: intact to light touch, cold, pin, vibration and joint position sense.  No extinction to double simultaneous stimulation.  Romberg test negative Deep Tendon Reflexes: +2 throughout, no ankle clonus Plantar responses: downgoing bilaterally Cerebellar: no incoordination on finger to nose, heel to shin. No dysdiadochokinesia Gait: narrow-based and steady, able to tandem walk adequately. Tremor:  none  IMPRESSION: This is a 64 year old left-handed woman with a history of recurrent stereotyped episodes where she feels tired, followed by speech difficulties, then muscle spasms on the right side, described as flexion at the elbow and right leg "turning in." The stereotyped nature does raise concern for partial seizures arising from the left hemisphere, however the prolonged durating is atypical. She is asking about psychogenic non-epileptic events, which is also a consideration. A 24-hour EEG will be ordered to further classify her symptoms. She will try melatonin for sleep, instead of taking Valium. We discussed [place] driving laws were discussed with the patient, and she knows  to stop driving after an episode of loss of consciousness/awareness, until 6 months event-free. She will follow-up after the EEG.   Thank you for allowing me to participate in the care of this patient. Please do not hesitate to call for any questions or concerns.   Patrcia Dolly, M.D.  CC: Dr. Hyman Hopes

## 2015-03-31 DIAGNOSIS — R4789 Other speech disturbances: Secondary | ICD-10-CM | POA: Insufficient documentation

## 2015-03-31 DIAGNOSIS — R259 Unspecified abnormal involuntary movements: Secondary | ICD-10-CM | POA: Insufficient documentation

## 2015-04-16 ENCOUNTER — Ambulatory Visit (INDEPENDENT_AMBULATORY_CARE_PROVIDER_SITE_OTHER): Payer: BLUE CROSS/BLUE SHIELD | Admitting: Neurology

## 2015-04-16 DIAGNOSIS — R259 Unspecified abnormal involuntary movements: Secondary | ICD-10-CM | POA: Diagnosis not present

## 2015-04-29 NOTE — Procedures (Signed)
ELECTROENCEPHALOGRAM REPORT  Dates of Recording: 04/16/2015 to 04/17/2015  Patient's Name: Colleen [name] MRN: [id number] Date of Birth: 11-Feb-1951  Referring Provider: Dr. Patrcia DollyKaren Camyra Vaeth  Procedure: 24-hour ambulatory EEG  History: This is a 64 year old woman with recurrent stereotyped episodes where she feels tired, followed by speech difficulties, then muscle spasms on the right side, described as flexion at the elbow and right leg "turning in."   Medications: Valium, Corgard, Synthroid, melatonin  Technical Summary: This is a 24-hour multichannel digital EEG recording measured by the international 10-20 system with electrodes applied with paste and impedances below 5000 ohms performed as portable with EKG monitoring.  The digital EEG was referentially recorded, reformatted, and digitally filtered in a variety of bipolar and referential montages for optimal display.    DESCRIPTION OF RECORDING: During maximal wakefulness, the background activity consisted of a symmetric 9 Hz posterior dominant rhythm which was reactive to eye opening.  There were no epileptiform discharges or focal slowing seen in wakefulness.  During the recording, the patient progresses through wakefulness, drowsiness, and Stage 2 sleep.  Again, there were no epileptiform discharges seen.  Events: On 10/5 at 1256 hours, patient reports spasms in the chest area. Electrographically, there were no EEG or EKG changes seen.  On 10/5 at 1700 to 1725 hours, she reports some lightheadedness and nausea. Electrographically, there were no EEG or EKG changes seen.  On 10/5 at 1815 hours, she reports spasms on the right side arm, chest, not clearly seen on video. Electrographically, there were no EEG or EKG changes seen.  There were no electrographic seizures seen.  EKG lead was unremarkable.  IMPRESSION: This 24-hour ambulatory EEG study is normal.    CLINICAL CORRELATION: The episodes of chest and right arm spasms,  lightheadedness and nausea, did not show any electrographic correlate. Baseline EEG normal. If further clinical questions remain, inpatient video EEG monitoring may be helpful.   Patrcia DollyKaren Levonne Carreras, M.D.

## 2015-05-01 ENCOUNTER — Encounter: Payer: Self-pay | Admitting: Neurology

## 2015-05-01 ENCOUNTER — Ambulatory Visit (INDEPENDENT_AMBULATORY_CARE_PROVIDER_SITE_OTHER): Payer: BLUE CROSS/BLUE SHIELD | Admitting: Neurology

## 2015-05-01 VITALS — BP 122/90 | HR 58 | Resp 16 | Wt 162.0 lb

## 2015-05-01 DIAGNOSIS — R259 Unspecified abnormal involuntary movements: Secondary | ICD-10-CM

## 2015-05-01 DIAGNOSIS — R4789 Other speech disturbances: Secondary | ICD-10-CM

## 2015-05-01 NOTE — Patient Instructions (Addendum)
1. Proceed with counseling as discussed. Also consider seeing a psychiatrist for depression and insomnia 2. Look into Lamictal (generic Lamotrigine) and let us know if this is something you would like to proceed with 3. Keep a calendar of your symptoms 4. As per [place] driving laws, after an episode of loss of consciousness/awareness, no driving until 6 months event-free 5. Follow-up in 3 months, call for any changes

## 2015-05-01 NOTE — Progress Notes (Signed)
NEUROLOGY FOLLOW UP OFFICE NOTE  Colleen [name] [id number]  HISTORY OF PRESENT ILLNESS: I had the pleasure of seeing Colleen [name] in follow-up in the neurology clinic on 05/01/2015.  The patient was last seen a month ago for recurrent episodes of body spasms and cognitive changes that started in 2015. I personally reviewed her 24-hour EEG which was normal. She reports chest spasms on the right side, arm, not clearly seen on video, as well as lightheadedness and nausea, with no EEG correlate. She reports that the episodes have become weaker, but she continues to have a sense of becoming less focused during them. The last episode lasted 45mins, but did not get stronger, her speech and cognition were not affected. She is concerned anxiety was also affecting this. She does endorse depression and stress everyday. She denies any headaches, dizziness, diplopia, no falls.   HPI: This is a 64 yo LH woman with a history of acute thyroiditis status post thyroidectomy, asthma, depression, anxiety, medication-induced seizure, GERD and goiter, in her usual state of health until 04/2014 when she started having episodes of sudden diffuse weakness and fatigue, with a sensation she would pass out. This would last for hours, she would feel that she could not focus. No vision changes, she reports her head would flex forward because she could not lift it up, she would not process speech but could hear people around her. Later on, this was followed by muscles spasms, more on the right side. It would start in her back, with full contraction forward, she describes her right arm flexed and right leg curled in. She also started having intermittent chest pain and mild shortness of breath because of the muscle spasms. She denied any pain or weakness in her extremities after the episodes, but did feel numbness and tingling. She had an MRI brain with and without contrast which was normal. She had a typical spell in the  office at that time, routine EEG captured muscle jerking without electrographic correlate. There were some rare sharply contoured alpha waves without clear epileptiform morphology seen in the left temporal lobe which did not correlate with any of her movements, but there were no epileptiform discharges or seizure activity seen. Essentially the EEG was within normal limits. She became symptom-free for 8 months, stating that throughout those months, she was working with her doctors trying to figure out the best BP medication for her. She started Lisinopril in May. A month later, she went to the ER on 12/21/14 for recurrence of body spasms from the right side. She was out shopping, then had the acute fatigue come over her. She was able to go home and lie down, when the spasms and contractions started. She felt she was losing focus but was able to call her daughter, getting half-words out, right arm and leg were curled in. She was given IV valium. She denied any headaches. She felt this may have been related to Lisinopril. She was switched from Lisinopril to Losartan. She had recurrence of symptoms last 03/20/15 while at work, she started losing focus but states she had no acute fatigue. She tried to control it, talked to her manager, then started having the contractions/spasms on the right side. She could hear but could not talk. This lasted a few minutes, the entire episode lasted 1-1/2 hours. She went home and took Valium and went to sleep. The last episode occurred 03/22/15 at home, she again knew what was happening, drove herself to Urgent Care feeling fatigued,  anxious and scared. She had to concentrate while walking and spoke slowly to the physician.   Her father and brother had epilepsy diagnosed in their younger years. Otherwise, she had a normal birth and early development. There is no history of febrile convulsions, CNS infections such as meningitis/encephalitis, significant traumatic brain injury,  neurosurgical procedures.   PAST MEDICAL HISTORY: Past Medical History  Diagnosis Date  . Anxiety   . GERD (gastroesophageal reflux disease)   . Heartburn   . Seizures (HCC)     MEDICATION INDUCED  . Family history of anesthesia complication     mother "nausea and vomiting post surger"  . Arrhythmia     "does not see cardiologist, sees pcp, Dr. Alonza Smoker  . Asthma     DX IN MARCH 2012 "allergy induced asthma"  . Bronchitis     hx of  . Urinary tract infection     hx of  . Herpes     on medication for outbreaks only  . Depression     "past hx of for Panic attacks, not on medication at this time"  . 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 Allergy   . Hypertension     MEDICATIONS: Current Outpatient Prescriptions on File Prior to Visit  Medication Sig Dispense Refill  . acetaminophen (TYLENOL) 500 MG tablet Take 1,000 mg by mouth 2 (two) times daily.     . diazepam (VALIUM) 2 MG tablet Take 1 tablet (2 mg total) by mouth daily as needed (Sleep). 90 tablet 1  . levothyroxine (SYNTHROID, LEVOTHROID) 100 MCG tablet Take 1 tablet (100 mcg total) by mouth daily. 90 tablet 3  . losartan (COZAAR) 25 MG tablet Take 25 mg by mouth daily.    . Melatonin 3 MG TABS Take 3 mg by mouth at bedtime.     No current facility-administered medications on file prior to visit.    ALLERGIES: Allergies  Allergen Reactions  . Cyclobenzaprine Hcl Other (See Comments)    REACTION: seizure  . Norvasc [Amlodipine Besylate] Other (See Comments)    Flushing, tingling in face, sore throat    FAMILY HISTORY: Family History  Problem Relation Age of Onset  . Heart failure Father   . Hypertension Father   . Seizures Father   . Heart disease Father   . Seizures Brother   . Irritable bowel syndrome Brother   . Cirrhosis Brother   . Hypertension Brother     x 4  . Fibromyalgia Sister   . Atrial  fibrillation Sister   . Prostate cancer Brother   . Cervical cancer Mother     SOCIAL HISTORY: Social History   Social History  . Marital Status: Married    Spouse Name: N/A  . Number of Children: 2  . Years of Education: N/A   Occupational History  . [employer]    Social History Main Topics  . Smoking status: Former Smoker    Quit date: 05/01/1999  . Smokeless tobacco: Never Used  . Alcohol Use: No  . Drug Use: No  . Sexual Activity:    Partners: Male   Other Topics Concern  . Not on file   Social History Narrative    REVIEW OF SYSTEMS: Constitutional: No fevers, chills, or sweats, no generalized fatigue, change in appetite Eyes: No visual changes, double vision, eye pain Ear, nose and throat: No hearing loss, ear pain, nasal  congestion, sore throat Cardiovascular: No chest pain, palpitations Respiratory:  No shortness of breath at rest or with exertion, wheezes GastrointestinaI: No nausea, vomiting, diarrhea, abdominal pain, fecal incontinence Genitourinary:  No dysuria, urinary retention or frequency Musculoskeletal:  No neck pain, back pain Integumentary: No rash, pruritus, skin lesions Neurological: as above Psychiatric: No depression, insomnia, anxiety Endocrine: No palpitations, fatigue, diaphoresis, mood swings, change in appetite, change in weight, increased thirst Hematologic/Lymphatic:  No anemia, purpura, petechiae. Allergic/Immunologic: no itchy/runny eyes, nasal congestion, recent allergic reactions, rashes  PHYSICAL EXAM: Filed Vitals:   05/01/15 1052  BP: 122/90  Pulse: 58  Resp: 16   General: No acute distress Head:  Normocephalic/atraumatic Neck: supple, no paraspinal tenderness, full range of motion Heart:  Regular rate and rhythm Lungs:  Clear to auscultation bilaterally Back: No paraspinal tenderness Skin/Extremities: No rash, no edema Neurological Exam: alert and oriented to person, place, and time. No aphasia or  dysarthria. Fund of knowledge is appropriate.  Recent and remote memory are intact.  Attention and concentration are normal.    Able to name objects and repeat phrases. Cranial nerves: Pupils equal, round, reactive to light.  Fundoscopic exam unremarkable, no papilledema. Extraocular movements intact with no nystagmus. Visual fields full. Facial sensation intact. No facial asymmetry. Tongue, uvula, palate midline.  Motor: Bulk and tone normal, muscle strength 5/5 throughout with no pronator drift.  Sensation to light touch intact.  No extinction to double simultaneous stimulation.  Deep tendon reflexes 2+ throughout, toes downgoing.  Finger to nose testing intact.  Gait narrow-based and steady, able to tandem walk adequately.  Romberg negative.  IMPRESSION: This is a 64 yo LH woman with a history of recurrent stereotyped episodes where she feels tired, followed by speech difficulties, then muscle spasms on the right side, described as flexion at the elbow and right leg "turning in." Her 24-hour EEG is normal, a typical but brief episode of spasms of the chest and right arm was captured with no EEG change. We had an extensive discussion regarding her symptoms. EEG can be normal with simple partial seizures. There is still a possibility these are seizures, we also discussed the possibility of non-epileptic events, and she does endorse stress and depression and is interested in seeing Behavioral Medicine. We discussed doing a therapeutic trial with Lamotrigine, this may help with mood as well. She would like to do more research on this first and will let us know if she would like to proceed. Side effects of Lamotrigine, including Levonne Spiller syndrome, were discussed. She will keep a calendar of her symptoms. We again discussed [place] driving laws were discussed with the patient, and she knows to stop driving after an episode of loss of consciousness/awareness, until 6 months event-free. She will follow-up in 3  months and knows to call for any changes.   Thank you for allowing me to participate in her care.  Please do not hesitate to call for any questions or concerns.  The duration of this appointment visit was 25 minutes of face-to-face time with the patient.  Greater than 50% of this time was spent in counseling, explanation of diagnosis, planning of further management, and coordination of care.   Patrcia Dolly, M.D.   CC: Dr. Hyman Hopes

## 2015-06-02 ENCOUNTER — Other Ambulatory Visit: Payer: Self-pay | Admitting: Family Medicine

## 2015-06-07 IMAGING — MG MM SCREEN MAMMOGRAM BILATERAL
4 series · 4 of 4 positions shown · non-contrast
Comparison: Previous exam(s).

CLINICAL DATA: Screening.

EXAM:
DIGITAL SCREENING BILATERAL MAMMOGRAM WITH CAD

[R CC]
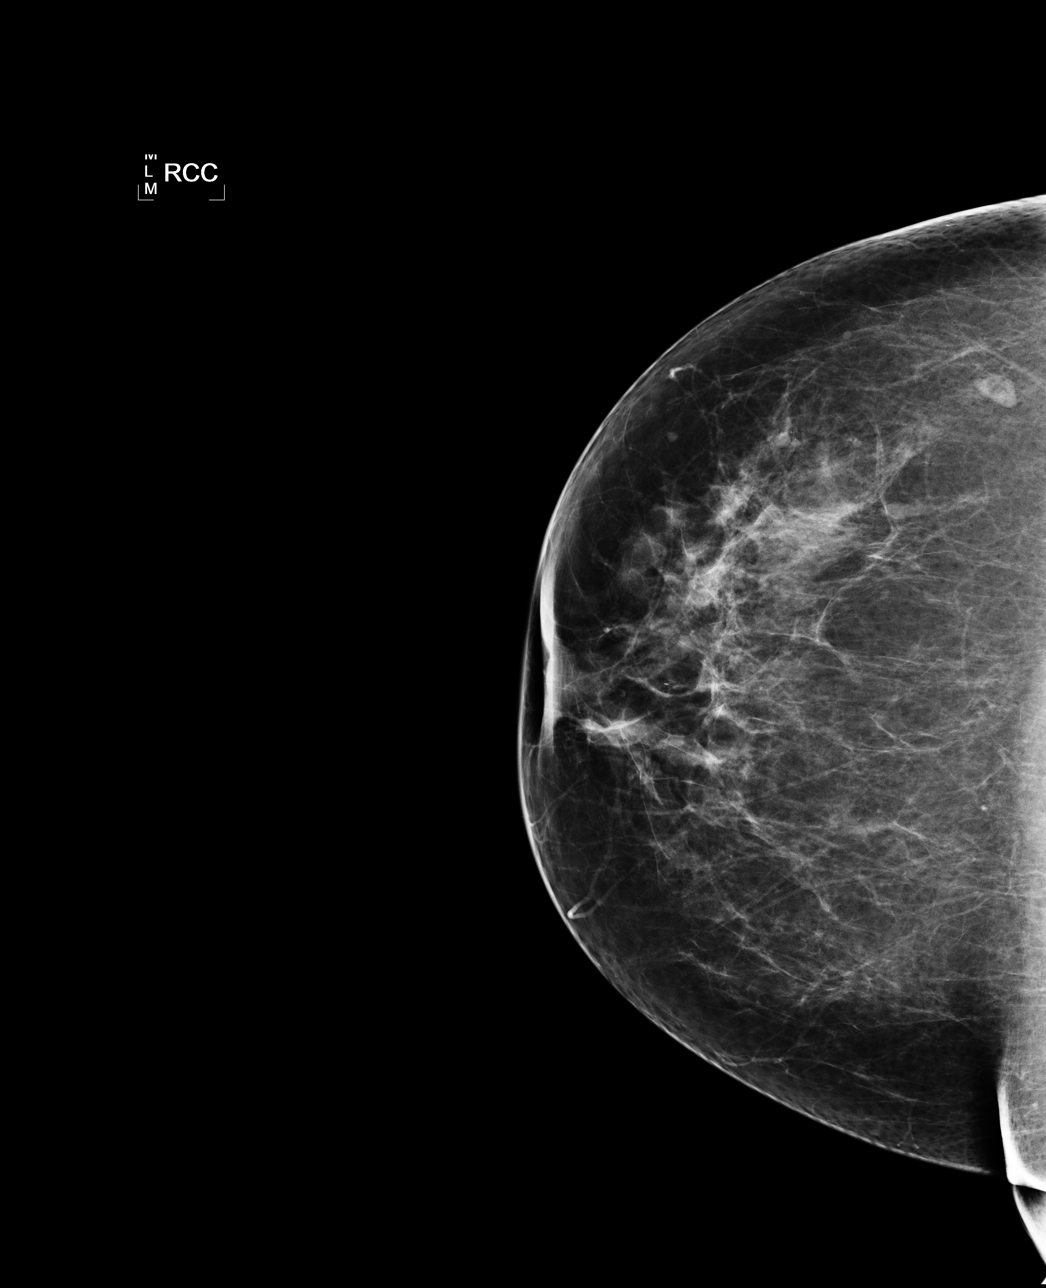

[L CC]
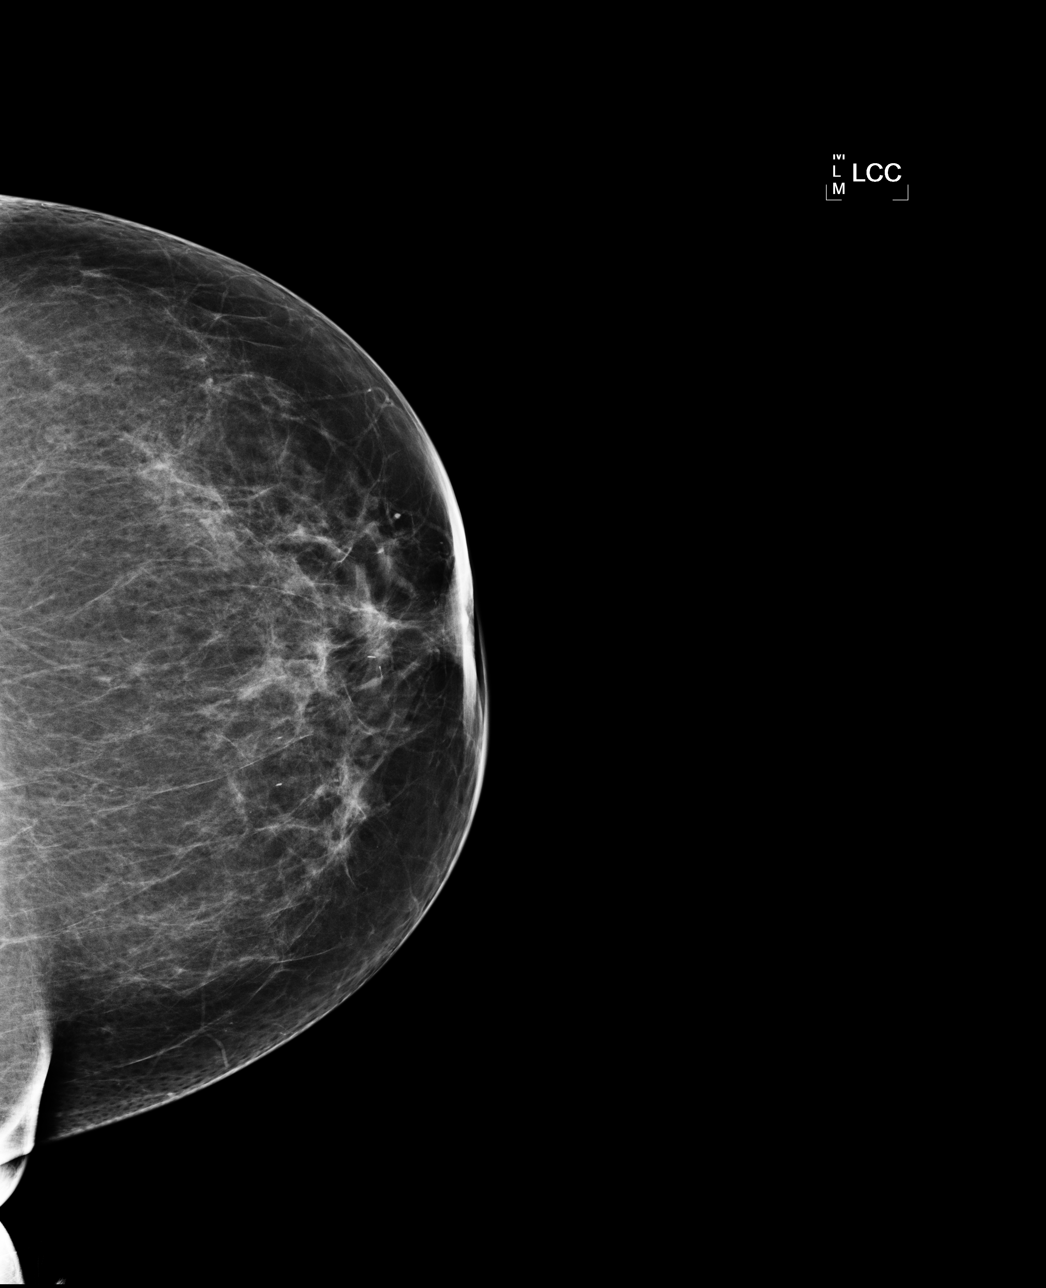

[L MLO]
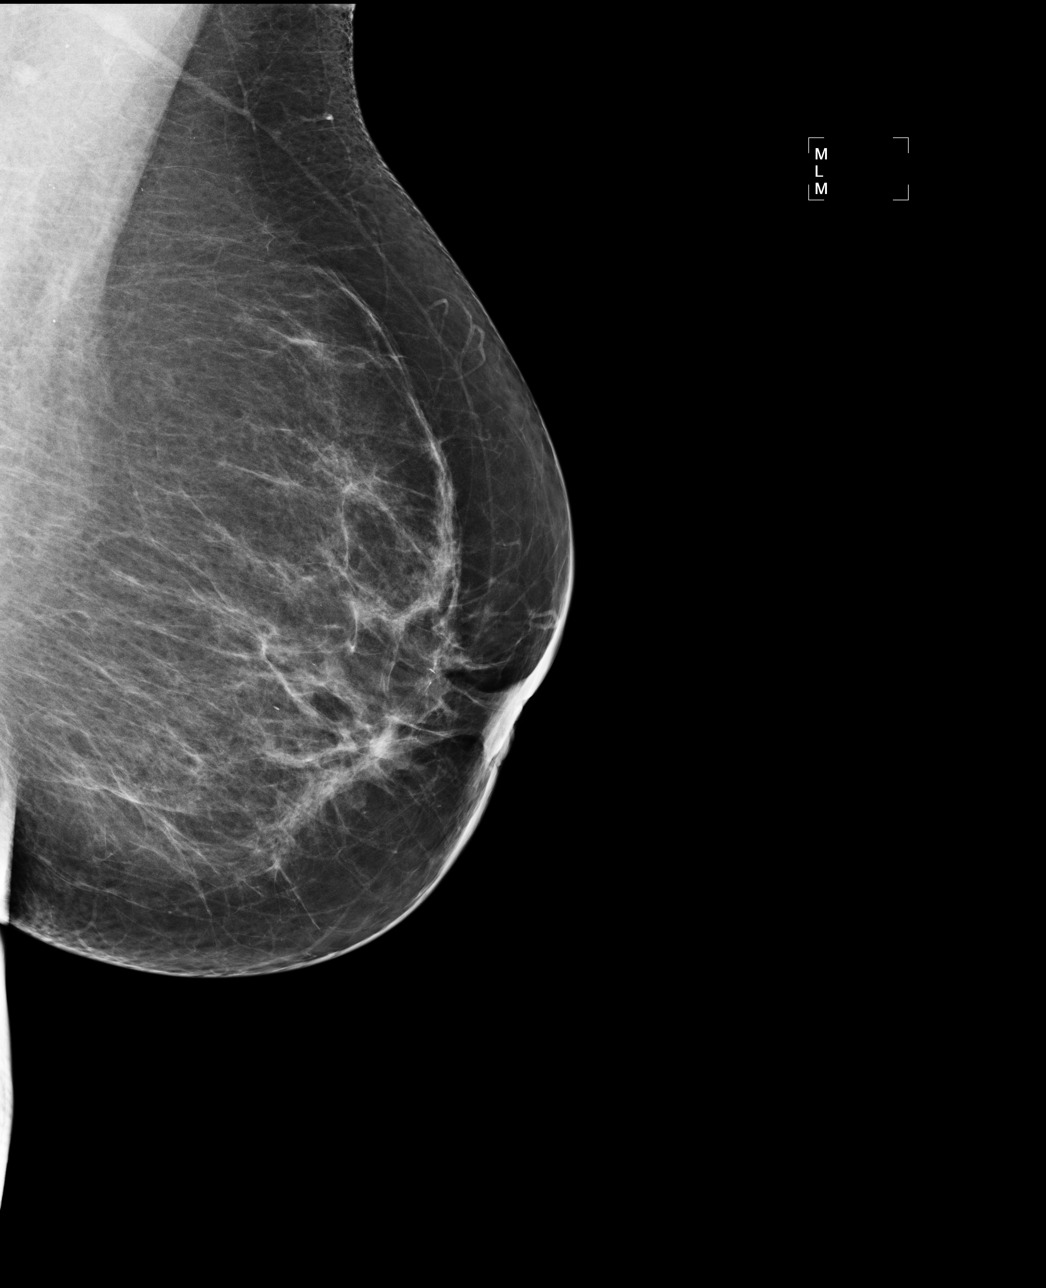

[R MLO]
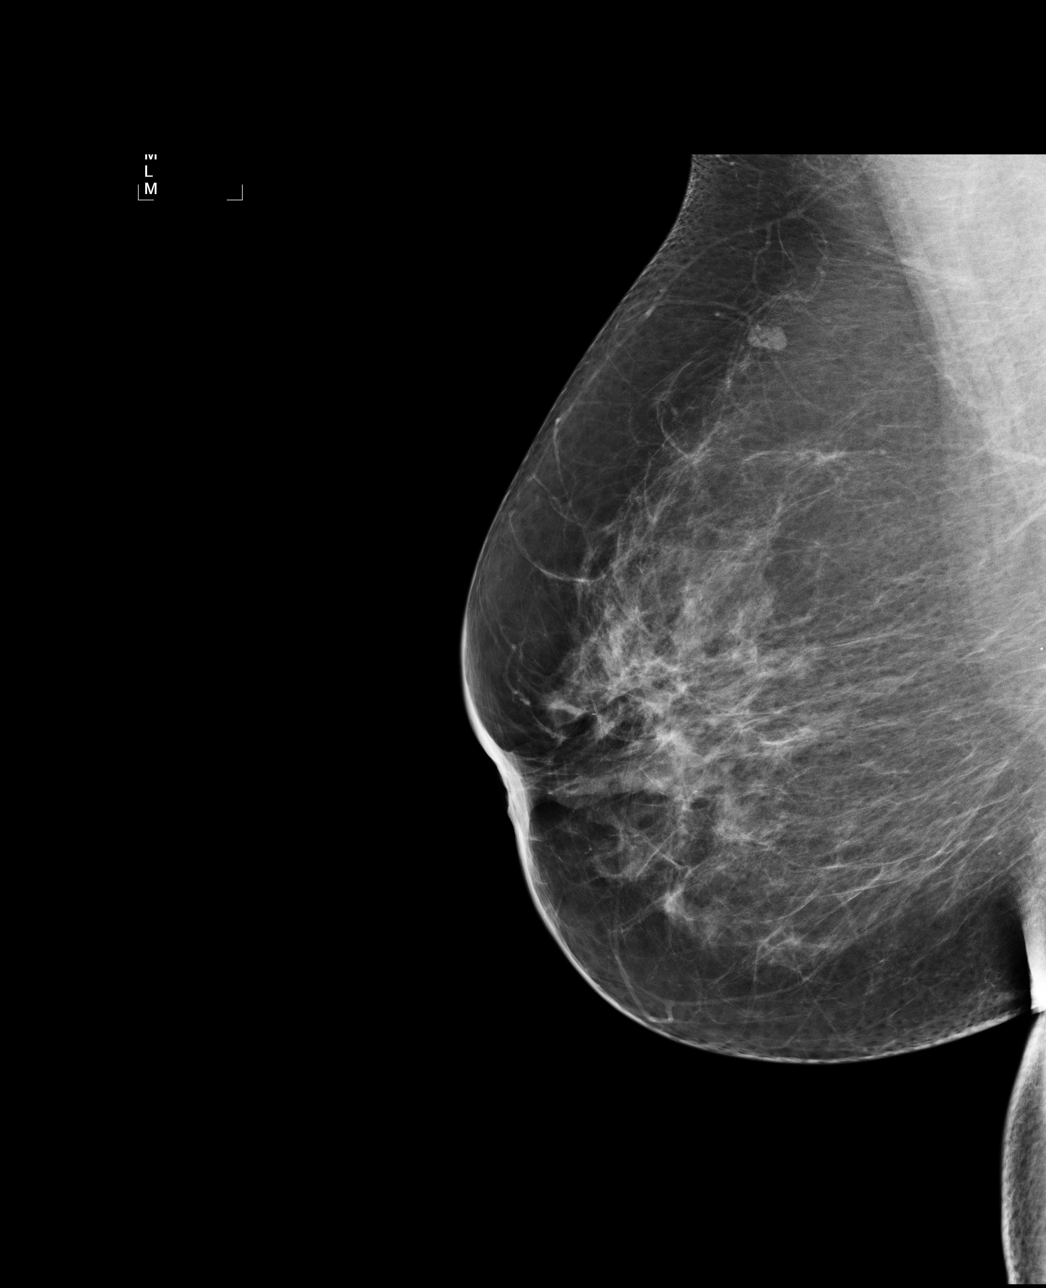

[4 of 4 positions shown; findings below may reference images not displayed]

ACR Breast Density Category b: There are scattered areas of
fibroglandular density.
FINDINGS: There are no findings suspicious for malignancy. Images were
processed with CAD.
IMPRESSION: No mammographic evidence of malignancy. A result letter of this
screening mammogram will be mailed directly to the patient.

RECOMMENDATION:
Screening mammogram in one year. (Code:AS-G-LCT)

BI-RADS CATEGORY  1: Negative.

## 2015-06-16 ENCOUNTER — Telehealth: Payer: Self-pay | Admitting: Neurology

## 2015-06-16 MED ORDER — LAMOTRIGINE 25 MG PO TABS: ORAL_TABLET | ORAL | Status: DC

## 2015-06-16 NOTE — Telephone Encounter (Signed)
Spoke with patient and she states that she is ready to try a trial of Lamictal as her & Dr. Karel JarvisAquino discussed at her last ov. She states that she has had increased stress lately and she is concerned that she is going to start having her "seizure episodes" again. She states she did start therapy but she doesn't think its really helping with her stress. Please advise.

## 2015-06-16 NOTE — Telephone Encounter (Signed)
VM-PT left message to have a call back in regards to wanting a prescription for medication that Dr Karel JarvisAquino recommended but did not state what the name was/Dawn CB# [phone]

## 2015-06-16 NOTE — Telephone Encounter (Signed)
Spoke with patient and gave her directions for Lamictal titration. Will send new Rx to her pharmacy. Advised her to call if she has any further questions or any issues with the medication.

## 2015-06-16 NOTE — Telephone Encounter (Signed)
Start lamotrigine 25mg  tablets.   AM   PM Week 1&2    25mg  (1 tab) Week 3&4 25mg  (1 tab)  25mg  (1 tab) Week 5 50mg  (2 tabs)  50mg  (2 tabs)  She should call the office after being on 50mg  twice daily for one week for further instruction.

## 2015-06-17 NOTE — Telephone Encounter (Signed)
Called patient back and reminded her about possible rash, and advised her to contact us if she develops rash.

## 2015-06-17 NOTE — Telephone Encounter (Signed)
Colleen [name], Dr. Karel JarvisAquino discussed this with the patient but I would remind the patient to contact us if she develops any new or strange rash while taking the Lamictal.

## 2015-06-27 ENCOUNTER — Telehealth: Payer: Self-pay | Admitting: Neurology

## 2015-06-27 NOTE — Telephone Encounter (Signed)
I spoke with patient about advisement. She will stop medication and f/u on Jan 31st.

## 2015-06-27 NOTE — Telephone Encounter (Signed)
Stop medication, f/u as scheduled so we can discuss other options. Thanks

## 2015-06-27 NOTE — Telephone Encounter (Signed)
Patient states that she doesn't think she should be on this medication. She states is has increased her feelings of panic attack and irritability. She has been on 25 mg daily, wants to stop it.

## 2015-06-27 NOTE — Telephone Encounter (Signed)
Pt called to inform that she wants to stop taking med/ Lamotrigine/ call back [phone]

## 2015-08-12 ENCOUNTER — Ambulatory Visit: Payer: BLUE CROSS/BLUE SHIELD | Admitting: Neurology

## 2015-09-05 ENCOUNTER — Encounter: Payer: Self-pay | Admitting: Gastroenterology

## 2015-09-16 ENCOUNTER — Encounter (HOSPITAL_COMMUNITY): Payer: Self-pay | Admitting: Emergency Medicine

## 2015-09-16 ENCOUNTER — Emergency Department (HOSPITAL_COMMUNITY)
Admission: EM | Admit: 2015-09-16 | Discharge: 2015-09-16 | Disposition: A | Discharge status: Home / Self Care | Payer: BLUE CROSS/BLUE SHIELD | Attending: Emergency Medicine | Admitting: Emergency Medicine

## 2015-09-16 ENCOUNTER — Telehealth: Payer: Self-pay | Admitting: *Deleted

## 2015-09-16 DIAGNOSIS — Z8619 Personal history of other infectious and parasitic diseases: Secondary | ICD-10-CM | POA: Insufficient documentation

## 2015-09-16 DIAGNOSIS — M62838 Other muscle spasm: Secondary | ICD-10-CM | POA: Diagnosis not present

## 2015-09-16 DIAGNOSIS — R110 Nausea: Secondary | ICD-10-CM | POA: Diagnosis not present

## 2015-09-16 DIAGNOSIS — I10 Essential (primary) hypertension: Secondary | ICD-10-CM | POA: Insufficient documentation

## 2015-09-16 DIAGNOSIS — G40909 Epilepsy, unspecified, not intractable, without status epilepticus: Secondary | ICD-10-CM | POA: Insufficient documentation

## 2015-09-16 DIAGNOSIS — Z8639 Personal history of other endocrine, nutritional and metabolic disease: Secondary | ICD-10-CM | POA: Insufficient documentation

## 2015-09-16 DIAGNOSIS — F419 Anxiety disorder, unspecified: Secondary | ICD-10-CM | POA: Insufficient documentation

## 2015-09-16 DIAGNOSIS — J45909 Unspecified asthma, uncomplicated: Secondary | ICD-10-CM | POA: Diagnosis not present

## 2015-09-16 DIAGNOSIS — F329 Major depressive disorder, single episode, unspecified: Secondary | ICD-10-CM | POA: Insufficient documentation

## 2015-09-16 DIAGNOSIS — R51 Headache: Secondary | ICD-10-CM | POA: Diagnosis not present

## 2015-09-16 DIAGNOSIS — Z87891 Personal history of nicotine dependence: Secondary | ICD-10-CM | POA: Diagnosis not present

## 2015-09-16 DIAGNOSIS — Z8719 Personal history of other diseases of the digestive system: Secondary | ICD-10-CM | POA: Insufficient documentation

## 2015-09-16 DIAGNOSIS — Z79899 Other long term (current) drug therapy: Secondary | ICD-10-CM | POA: Insufficient documentation

## 2015-09-16 DIAGNOSIS — Z8709 Personal history of other diseases of the respiratory system: Secondary | ICD-10-CM | POA: Diagnosis not present

## 2015-09-16 DIAGNOSIS — Z7951 Long term (current) use of inhaled steroids: Secondary | ICD-10-CM | POA: Diagnosis not present

## 2015-09-16 DIAGNOSIS — Z87440 Personal history of urinary (tract) infections: Secondary | ICD-10-CM | POA: Insufficient documentation

## 2015-09-16 DIAGNOSIS — R079 Chest pain, unspecified: Secondary | ICD-10-CM | POA: Diagnosis present

## 2015-09-16 MED ORDER — ONDANSETRON HCL 4 MG/2ML IJ SOLN
4.0000 mg | Freq: Once | INTRAMUSCULAR | Status: AC
  Administered 2015-09-16: 4 mg via INTRAVENOUS
  Filled 2015-09-16: qty 2

## 2015-09-16 MED ORDER — DIAZEPAM 5 MG/ML IJ SOLN
5.0000 mg | Freq: Once | INTRAMUSCULAR | Status: AC
  Administered 2015-09-16: 5 mg via INTRAVENOUS
  Filled 2015-09-16: qty 2

## 2015-09-16 NOTE — ED Notes (Signed)
PA riley advised patient requesting a provider no recommendation at this time.

## 2015-09-16 NOTE — ED Provider Notes (Signed)
Patient developed muscle spasms in her chest which she's had multiple times in the past over the past 1.5 years. She's been treated in the past with IV Valium for same complaint. She's had an extensive neurologic evaluation by Dr.Aquino including MRI and EEG which were normal. Found not to be seizure activity. No treatment prior to coming here. On exam alert anxious appearing HEENT exam no facial asymmetry neck supple trachea midline lungs clear to auscultation heart regular rate and rhythm abdomen nondistended and nontender all 4 extremities without redness swelling or tenderness neurovascular intact. Patient experiencing "muscle spasms" which lasts 1 or 2 seconds as I am examining her and speaking with her, causing her to have discomfort at anterior chest  Doug SouSam Sandip Power, MD 09/16/15 1545

## 2015-09-16 NOTE — ED Provider Notes (Signed)
CSN: [id number]     Arrival date & time 09/16/15  1356 History   First MD Initiated Contact with Patient 09/16/15 1502     Chief Complaint  Patient presents with  . Seizures     (Consider location/radiation/quality/duration/timing/severity/associated sxs/prior Treatment) The history is provided by the patient, medical records and a significant other. No language interpreter was used.     Colleen [name] is a 65 y.o. female  with a hx of anxiety, GERD, seizures, asthma, HTN, hypothyroid s/p thyroidectomy (2013) presents to the Emergency Department complaining of gradual, persistent, progressively worsening spasms onset 12:00.   Pt reports spasms are in center chest, in both arms and R leg.  She reports associated nausea and headache.  Pt reports she has had episodes of this since 2015 and they are treated by Dr. Daisey Must.  She reports 3 severe episodes in the last few years that have required treatment with IV Valium; last episode was September 2016.  Associated symptoms include pain in her central chest where her cramping is.  Mild headache, gradual in onset beginning after several hours of muscle spasms.  She denies neck pain, falls, shortness of breath, abdominal pain, vomiting, diarrhea, weakness, numbness, tingling, syncope. She reports that normally she has a prodrome of weakness and fatigue however she did not have a prodrome today.  She also reports often having cognitive dissociation reports this also has been absent today.  Pt arrives via EMS who gave 2.5mg  of versed PTA without improvement.    Neuro: [facility]  Review shows the patient saw Dr. Karel Jarvis last on 03/31/2015.  That no details a two-year history of spasms with several episodes requiring IV Valium. She is also taking Valium for sleep.  It details a prodrome of fatigue and post confusion and weakness.  Record review shows the patient has had a normal EEG and normal MRI since the onset of the symptoms.  He  questions if these are partial seizures versus potential psychogenic nonepileptic events.  Past Medical History  Diagnosis Date  . Anxiety   . GERD (gastroesophageal reflux disease)   . Heartburn   . Seizures (HCC)     MEDICATION INDUCED  . Family history of anesthesia complication     mother "nausea and vomiting post surger"  . Arrhythmia     "does not see cardiologist, sees pcp, Dr. Alonza Smoker  . Asthma     DX IN MARCH 2012 "allergy induced asthma"  . Bronchitis     hx of  . Urinary tract infection     hx of  . Herpes     on medication for outbreaks only  . Depression     "past hx of for Panic attacks, not on medication at this time"  . 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 Allergy   . Hypertension    Past Surgical History  Procedure Laterality Date  . Spinal cystectomy      "where pilonidal cyst was"  . Knee arthroscopy      right  . Cesarean section      x 2  . Pilonidal cyst excision    . Elbow surgery      right  . Colonoscopy    . Thyroidectomy  04/07/2012    Procedure: THYROIDECTOMY;  Surgeon: Currie Paris, MD;  Location: [facility] OR;  Service: General;  Laterality: N/A;      Family  History  Problem Relation Age of Onset  . Heart failure Father   . Hypertension Father   . Seizures Father   . Heart disease Father   . Seizures Brother   . Irritable bowel syndrome Brother   . Cirrhosis Brother   . Hypertension Brother     x 4  . Fibromyalgia Sister   . Atrial fibrillation Sister   . Prostate cancer Brother   . Cervical cancer Mother    Social History  Substance Use Topics  . Smoking status: Former Smoker    Quit date: 05/01/1999  . Smokeless tobacco: Never Used  . Alcohol Use: No   OB History    No data available     Review of Systems  Constitutional: Negative for fever, diaphoresis, appetite change, fatigue and unexpected weight change.  HENT:  Negative for mouth sores.   Eyes: Negative for visual disturbance.  Respiratory: Negative for cough, chest tightness, shortness of breath and wheezing.   Cardiovascular: Negative for chest pain.  Gastrointestinal: Negative for nausea, vomiting, abdominal pain, diarrhea and constipation.  Endocrine: Negative for polydipsia, polyphagia and polyuria.  Genitourinary: Negative for dysuria, urgency, frequency and hematuria.  Musculoskeletal: Negative for back pain and neck stiffness.  Skin: Negative for rash.  Allergic/Immunologic: Negative for immunocompromised state.  Neurological: Negative for syncope, light-headedness and headaches.       Recurrent spasms  Hematological: Does not bruise/bleed easily.  Psychiatric/Behavioral: Negative for sleep disturbance. The patient is not nervous/anxious.       Allergies  Cyclobenzaprine hcl and Norvasc  Home Medications   Prior to Admission medications   Medication Sig Start Date End Date Taking? Authorizing Provider  acetaminophen (TYLENOL) 500 MG tablet Take 1,000 mg by mouth 2 (two) times daily.     Historical Provider, MD  cetirizine (ZYRTEC) 10 MG tablet 10 mg. Take 1 tablet by mouth daily 04/23/15   Historical Provider, MD  diazepam (VALIUM) 2 MG tablet Take 1 tablet (2 mg total) by mouth daily as needed (Sleep). 09/05/14   Roderick Pee, MD  fluticasone Aleda Grana) 50 MCG/ACT nasal spray 1 spray into each nostril daily 04/23/15   Historical Provider, MD  lamoTRIgine (LAMICTAL) 25 MG tablet Take 1 tablet by mouth every evening for 2 weeks, then increase to 1 tablet by mouth twice a day for 2 weeks, then increase to 2 tablets by mouth twice a day and continue 06/16/15   Drema Dallas, DO  levothyroxine (SYNTHROID, LEVOTHROID) 100 MCG tablet TAKE 1 TABLET DAILY 06/02/15   Roderick Pee, MD  losartan (COZAAR) 25 MG tablet Take 25 mg by mouth daily.    Historical Provider, MD  Melatonin 3 MG TABS Take 3 mg by mouth at bedtime.    Historical Provider,  MD  nadolol (CORGARD) 20 MG tablet 20 mg. Take 1 tablet daily 01/27/15   Historical Provider, MD   BP 125/73 mmHg  Pulse 68  Temp(Src) 98.6 F (37 C) (Oral)  Resp 18  SpO2 96% Physical Exam  Constitutional: She is oriented to person, place, and time. She appears well-developed and well-nourished. No distress.  HENT:  Head: Normocephalic and atraumatic.  Mouth/Throat: Oropharynx is clear and moist.  Eyes: Conjunctivae and EOM are normal. Pupils are equal, round, and reactive to light. No scleral icterus.  No horizontal, vertical or rotational nystagmus  Neck: Normal range of motion. Neck supple.  Full active and passive ROM without pain No midline or paraspinal tenderness No nuchal rigidity or meningeal signs  Cardiovascular: Normal rate, regular rhythm, normal heart sounds and intact distal pulses.   Pulmonary/Chest: Effort normal and breath sounds normal. No respiratory distress. She has no wheezes. She has no rales.  Abdominal: Soft. Bowel sounds are normal. She exhibits no distension. There is no tenderness. There is no rebound and no guarding.  Musculoskeletal: Normal range of motion.  Lymphadenopathy:    She has no cervical adenopathy.  Neurological: She is alert and oriented to person, place, and time. She has normal reflexes. No cranial nerve deficit. She exhibits normal muscle tone. Coordination normal.  Mental Status:  Alert, oriented, thought content appropriate. Speech fluent without evidence of aphasia. Able to follow 2 step commands without difficulty.  Patient with recurrent spasms originating in her chest every 15-30 seconds Cranial Nerves:  II:  Peripheral visual fields grossly normal, pupils equal, round, reactive to light III,IV, VI: ptosis not present, extra-ocular motions intact bilaterally  V,VII: smile symmetric, facial light touch sensation equal VIII: hearing grossly normal bilaterally  IX,X: midline uvula rise  XI: bilateral shoulder shrug equal and  strong XII: midline tongue extension  Motor:  5/5 in upper and lower extremities bilaterally including strong and equal grip strength and dorsiflexion/plantar flexion Sensory: Pinprick and light touch normal in all extremities.  Deep Tendon Reflexes: 2+ and symmetric  Cerebellar: normal finger-to-nose with bilateral upper extremities Gait: Gait testing deferred CV: distal pulses palpable throughout   Skin: Skin is warm and dry. No rash noted. She is not diaphoretic. No erythema.  Psychiatric: She has a normal mood and affect. Her behavior is normal. Judgment and thought content normal.  Nursing note and vitals reviewed.   ED Course  Procedures (including critical care time) Labs Review Labs Reviewed - No data to display  Imaging Review No results found. I have personally reviewed and evaluated these images and lab results as part of my medical decision-making.   EKG Interpretation   Date/Time:  Tuesday September 16 2015 15:24:03 EST Ventricular Rate:  63 PR Interval:  146 QRS Duration: 84 QT Interval:  438 QTC Calculation: 448 R Axis:   46 Text Interpretation:  Normal sinus rhythm Nonspecific T wave abnormality  Abnormal ECG No significant change since last tracing Confirmed by  Ethelda ChickJACUBOWITZ  MD, SAM [id number]) on 09/16/2015 3:34:35 PM       05/09/14 MRI Brain -  IMPRESSION: 1. Normal MRI of the brain without and with contrast.   04/29/15 24hr EEG -  IMPRESSION: This 24-hour ambulatory EEG study is normal.     MDM   Final diagnoses:  Muscle spasm   Cheri GuppyAnne K Kearse-Mezgar resents with spasms similar to previous. Patient has been evaluated by neurology numerous times in the past. She was given Valium IV here in the emergency department with complete resolution of her spasms within 15 minutes. Discussed potential for electrolyte abnormalities as she has been spasming for longer than normal.  She wishes to follow-up with her primary care physician for lab work. Her EKG was  reassuring. Her chest pain has resolved with her resolution of spasm. Doubt cardiac event. No evidence of seizure activity. No loss of bowel or bladder control. She is ambulatory in the emergency department without assistance at this time. Request she see her primary care provider or neurologist within 2 days.    The patient was discussed with and seen by Dr. Ethelda ChickJacubowitz who agrees with the treatment plan.   Dahlia ClientHannah Jannae Fagerstrom, PA-C 09/16/15 [id number]  Doug SouSam Jacubowitz, MD 09/17/15 [phone]

## 2015-09-16 NOTE — ED Notes (Signed)
Patient comes from work states she has been having Seizure like activity. Patient states she has been seen at the hospital for it before. Patient alert and oriented  able to talk during the seizure like activity. Patient given 2.5 versed.patient states she takes 5mg  Valium but unable to take daily because it makes her sleepy.

## 2015-09-16 NOTE — Telephone Encounter (Signed)
Patient's friend called stating that patient has been having jerky movements for about 20 minutes and her face is drawing up on the right side.  I instructed her to take her to the ER immediately.  She agreed with plan.

## 2015-09-16 NOTE — Discharge Instructions (Signed)
1. Medications: usual home medications 2. Treatment: rest, drink plenty of fluids, take home Valium for recurrent spasms 3. Follow Up: Please followup with your primary doctor and/or neurology in 2 days for discussion of your diagnoses and further evaluation after today's visit; if you do not have a primary care doctor use the resource guide provided to find one; Please return to the ER for spasms that do not resolve at home, difficulty speaking or walking not associated with spasms or other concerns

## 2015-09-19 ENCOUNTER — Ambulatory Visit: Payer: BLUE CROSS/BLUE SHIELD | Admitting: Neurology

## 2016-01-14 ENCOUNTER — Other Ambulatory Visit: Payer: Self-pay | Admitting: Family Medicine

## 2016-01-14 DIAGNOSIS — Z1231 Encounter for screening mammogram for malignant neoplasm of breast: Secondary | ICD-10-CM

## 2016-01-27 ENCOUNTER — Other Ambulatory Visit: Payer: Self-pay | Admitting: Family Medicine

## 2016-01-27 ENCOUNTER — Ambulatory Visit
Admission: RE | Admit: 2016-01-27 | Discharge: 2016-01-27 | Disposition: A | Discharge status: Home / Self Care | Payer: BLUE CROSS/BLUE SHIELD | Source: Ambulatory Visit | Attending: Family Medicine | Admitting: Family Medicine

## 2016-01-27 DIAGNOSIS — N63 Unspecified lump in unspecified breast: Secondary | ICD-10-CM

## 2016-01-27 DIAGNOSIS — Z1231 Encounter for screening mammogram for malignant neoplasm of breast: Secondary | ICD-10-CM

## 2016-02-02 ENCOUNTER — Ambulatory Visit
Admission: RE | Admit: 2016-02-02 | Discharge: 2016-02-02 | Disposition: A | Discharge status: Home / Self Care | Payer: BLUE CROSS/BLUE SHIELD | Source: Ambulatory Visit | Attending: Family Medicine | Admitting: Family Medicine

## 2016-02-02 DIAGNOSIS — N63 Unspecified lump in unspecified breast: Secondary | ICD-10-CM

## 2016-02-17 ENCOUNTER — Other Ambulatory Visit: Payer: Self-pay | Admitting: Family Medicine

## 2016-02-18 NOTE — Telephone Encounter (Signed)
Rx refill sent to pharmacy. 

## 2016-08-24 ENCOUNTER — Other Ambulatory Visit: Payer: Self-pay | Admitting: Family Medicine

## 2018-04-03 ENCOUNTER — Encounter (HOSPITAL_COMMUNITY): Payer: Self-pay | Admitting: Emergency Medicine

## 2018-04-03 ENCOUNTER — Emergency Department (HOSPITAL_COMMUNITY)
Admission: EM | Admit: 2018-04-03 | Discharge: 2018-04-03 | Disposition: A | Discharge status: Home / Self Care | Payer: Managed Care, Other (non HMO) | Attending: Emergency Medicine | Admitting: Emergency Medicine

## 2018-04-03 ENCOUNTER — Other Ambulatory Visit: Payer: Self-pay

## 2018-04-03 DIAGNOSIS — M62838 Other muscle spasm: Secondary | ICD-10-CM | POA: Diagnosis present

## 2018-04-03 DIAGNOSIS — Z5321 Procedure and treatment not carried out due to patient leaving prior to being seen by health care provider: Secondary | ICD-10-CM | POA: Diagnosis not present

## 2018-04-03 NOTE — ED Notes (Signed)
Pt asking how long the wait will be once this RN has finished triaging. She then states "Well discharge me then, I can't wait out there so just let me go." Pt was encouraged to stay to be seen. Pt escorted by wheelchair to recliners for comfort.

## 2018-04-03 NOTE — ED Triage Notes (Signed)
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 States"I need IV ativan." Pts speech is very pressured. Answers questions appropriately. Alert at triage.

## 2018-04-03 NOTE — ED Notes (Signed)
Pt family member came to nurses station asking about wait time, this rn informed the family member where the pt was listed in the waiting room.

## 2018-09-12 ENCOUNTER — Other Ambulatory Visit: Payer: Self-pay

## 2018-09-12 ENCOUNTER — Emergency Department (HOSPITAL_COMMUNITY)
Admission: EM | Admit: 2018-09-12 | Discharge: 2018-09-12 | Disposition: A | Discharge status: Home / Self Care | Payer: Managed Care, Other (non HMO) | Attending: Emergency Medicine | Admitting: Emergency Medicine

## 2018-09-12 ENCOUNTER — Emergency Department (HOSPITAL_COMMUNITY): Payer: Managed Care, Other (non HMO)

## 2018-09-12 DIAGNOSIS — F419 Anxiety disorder, unspecified: Secondary | ICD-10-CM | POA: Insufficient documentation

## 2018-09-12 DIAGNOSIS — I10 Essential (primary) hypertension: Secondary | ICD-10-CM | POA: Diagnosis not present

## 2018-09-12 DIAGNOSIS — F444 Conversion disorder with motor symptom or deficit: Secondary | ICD-10-CM | POA: Insufficient documentation

## 2018-09-12 DIAGNOSIS — J45909 Unspecified asthma, uncomplicated: Secondary | ICD-10-CM | POA: Diagnosis not present

## 2018-09-12 DIAGNOSIS — Z87891 Personal history of nicotine dependence: Secondary | ICD-10-CM | POA: Insufficient documentation

## 2018-09-12 DIAGNOSIS — F329 Major depressive disorder, single episode, unspecified: Secondary | ICD-10-CM | POA: Diagnosis not present

## 2018-09-12 DIAGNOSIS — G259 Extrapyramidal and movement disorder, unspecified: Secondary | ICD-10-CM

## 2018-09-12 DIAGNOSIS — R4182 Altered mental status, unspecified: Secondary | ICD-10-CM | POA: Diagnosis present

## 2018-09-12 DIAGNOSIS — Z79899 Other long term (current) drug therapy: Secondary | ICD-10-CM | POA: Diagnosis not present

## 2018-09-12 LAB — CBC
HCT: 40.3 % (ref 36.0–46.0)
HEMOGLOBIN: 14 g/dL (ref 12.0–15.0)
MCH: 32.2 pg (ref 26.0–34.0)
MCHC: 34.7 g/dL (ref 30.0–36.0)
MCV: 92.6 fL (ref 80.0–100.0)
NRBC: 0 % (ref 0.0–0.2)
Platelets: 244 10*3/uL (ref 150–400)
RBC: 4.35 MIL/uL (ref 3.87–5.11)
RDW: 11.6 % (ref 11.5–15.5)
WBC: 4.3 10*3/uL (ref 4.0–10.5)

## 2018-09-12 LAB — BASIC METABOLIC PANEL
ANION GAP: 4 — AB (ref 5–15)
BUN: 11 mg/dL (ref 8–23)
CALCIUM: 9.4 mg/dL (ref 8.9–10.3)
CO2: 29 mmol/L (ref 22–32)
CREATININE: 0.97 mg/dL (ref 0.44–1.00)
Chloride: 103 mmol/L (ref 98–111)
GFR calc non Af Amer: >60 mL/min (ref >60)
Glucose, Bld: 99 mg/dL (ref 70–99)
Potassium: 4.3 mmol/L (ref 3.5–5.1)
SODIUM: 136 mmol/L (ref 135–145)

## 2018-09-12 LAB — CBG MONITORING, ED: GLUCOSE-CAPILLARY: 67 mg/dL — AB (ref 70–99)

## 2018-09-12 MED ORDER — LORAZEPAM 2 MG/ML IJ SOLN
1.0000 mg | Freq: Once | INTRAMUSCULAR | Status: AC
  Administered 2018-09-12: 1 mg via INTRAVENOUS
  Filled 2018-09-12: qty 1

## 2018-09-12 MED ORDER — ALPRAZOLAM 0.25 MG PO TABS: 0.5000 mg | ORAL_TABLET | Freq: Two times a day (BID) | ORAL | Status: DC

## 2018-09-12 MED ORDER — LORAZEPAM 2 MG/ML IJ SOLN
2.0000 mg | Freq: Once | INTRAMUSCULAR | Status: AC
  Administered 2018-09-12: 2 mg via INTRAVENOUS
  Filled 2018-09-12: qty 1

## 2018-09-12 MED ORDER — LEVOTHYROXINE SODIUM 25 MCG PO TABS: 125.0000 ug | ORAL_TABLET | Freq: Every day | ORAL | Status: DC

## 2018-09-12 MED ORDER — ACETAMINOPHEN 500 MG PO TABS: 500.0000 mg | ORAL_TABLET | Freq: Three times a day (TID) | ORAL | Status: DC | PRN

## 2018-09-12 MED ORDER — LOSARTAN POTASSIUM 50 MG PO TABS
50.0000 mg | ORAL_TABLET | Freq: Every day | ORAL | Status: DC
  Administered 2018-09-12: 50 mg via ORAL
  Filled 2018-09-12: qty 1

## 2018-09-12 NOTE — ED Provider Notes (Signed)
  Physical Exam  BP 121/74   Pulse 61   Resp 13   SpO2 96%   Physical Exam  ED Course/Procedures     Procedures  MDM  Patient care assumed at 4 pm. Patient had trouble speaking today.  Patient actually has history of movement disorder and has been followed with [facility] neurology.  Patient was given Ativan in the ED and her speech improved and no focal neurologic deficits.  She has been depressed so psychiatry consult was requested and was pending at signout.   7:27 PM TTS saw patient and patient doesn't meet inpatient psych criteria. CT head showed no obvious bleeding or large stroke. Patient's speech improved after ativan. Stable for discharge. I encouraged her to follow up with her neurologist at [facility].      Charlynne Pander, MD 09/12/18 [phone]

## 2018-09-12 NOTE — BH Assessment (Addendum)
Assessment Note  Colleen [name] is an 68 y.o. female.  The pt came in after not being able to talk.  The pt and her daughter stated the pt attempted to talk, but what the pt said didn't make sense.  The pt and the pt's daughter stated this hasn't happened previously. The pt stated she has a "movement disorder" where her muscles will spasm and she will have a hard time talking and will sometime faint.  The pt denies SI, HI and psychosis.  She is currently seeing a counselor Britta Mccreedy Michelene Heady) and not seeing a psychiatrist currently.  She was last inpatient in 1977 and early 80's for severe depression and anxiety.  The pt denies any previous suicide attempt.  The pt lives alone.  She is currently on short term disability.  She denies self harm, HI, legal issues, history of abuse and hallucinations. The pt stated she sleeps well, but will start having spasms if she wakes up in the middle of the night.  She doesn't have spasms when she is sleeping.  The pt is eating well.  She denies SA.  Pt is dressed in scrubs. She is alert and oriented x4. Pt speaks in a clear tone, at moderate volume and normal pace.  The pt stuttered when she was having muscle spasms. Eye contact is good. Pt's mood is pleasant. Thought process is coherent and relevant. There is no indication Pt is currently responding to internal stimuli or experiencing delusional thought content.?Pt was cooperative throughout assessment.    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 Allergy   . Anxiety   . Arrhythmia    "does not see cardiologist, sees pcp, Dr. Alonza Smoker  . Asthma    DX IN MARCH 2012 "allergy induced asthma"  . Bronchitis    hx of  . Depression    "past hx of for Panic attacks, not on medication at this time"  . Family history of anesthesia complication    mother "nausea and vomiting post  surger"  . GERD (gastroesophageal reflux disease)   . 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 Heartburn   . Herpes    on medication for outbreaks only  . Hypertension   . Seizures (HCC)    MEDICATION INDUCED  . Urinary tract infection    hx of    Past Surgical History:  Procedure Laterality Date  . CESAREAN SECTION     x 2  . COLONOSCOPY    . ELBOW SURGERY     right  . KNEE ARTHROSCOPY     right  . PILONIDAL CYST EXCISION    . spinal cystectomy     "where pilonidal cyst was"  . THYROIDECTOMY  04/07/2012   Procedure: THYROIDECTOMY;  Surgeon: Currie Paris, MD;  Location: [facility] OR;  Service: General;  Laterality: N/A;       Family History:  Family History  Problem Relation Age of Onset  . Heart failure Father   . Hypertension Father   . Seizures Father   . Heart disease Father   . Cervical cancer Mother   . Seizures Brother   . Irritable bowel syndrome Brother   . Cirrhosis Brother   . Hypertension Brother        x 4  . Fibromyalgia Sister   . Atrial fibrillation Sister   .  Prostate cancer Brother     Social History:  reports that she quit smoking about 19 years ago. She has never used smokeless tobacco. She reports that she does not drink alcohol or use drugs.  Additional Social History:  Alcohol / Drug Use Pain Medications: See MAR Prescriptions: See MAR Over the Counter: See MAR History of alcohol / drug use?: No history of alcohol / drug abuse Longest period of sobriety (when/how long): NA  CIWA: CIWA-Ar BP: 128/61 Pulse Rate: 64 COWS:    Allergies:  Allergies  Allergen Reactions  . Amlodipine Other (See Comments)    Flushing, tingling in face, sore throat  . Cyclobenzaprine Hcl Other (See Comments)    Seizures   . Lamictal [Lamotrigine] Other (See Comments)    Constipation and irritability (in the level/dose that actually might help)  . Norvasc  [Amlodipine Besylate] Other (See Comments)    Flushing, tingling in face, sore throat    Home Medications: (Not in a hospital admission)   OB/GYN Status:  No LMP recorded. Patient is postmenopausal.  General Assessment Data Location of Assessment: [facility] ED TTS Assessment: In system Is this a Tele or Face-to-Face Assessment?: Face-to-Face Is this an Initial Assessment or a Re-assessment for this encounter?: Initial Assessment Patient Accompanied by:: Adult Permission Given to speak with another: Yes Name, Relationship and Phone Number: Kathleen-daughter Language Other than English: No Living Arrangements: Other (Comment)(home) What gender do you identify as?: Female Marital status: Single Maiden name: Chock Pregnancy Status: No Living Arrangements: Alone Can pt return to current living arrangement?: Yes Admission Status: Voluntary Is patient capable of signing voluntary admission?: Yes Referral Source: Self/Family/Friend Insurance type: Cign[employer]     Crisis Care Plan Living Arrangements: Alone Legal Guardian: Other:(Self) Name of Psychiatrist: none Name of Therapist: Leverne Humbles  Education Status Is patient currently in school?: No Is the patient employed, unemployed or receiving disability?: Employed  Risk to self with the past 6 months Suicidal Ideation: No Has patient been a risk to self within the past 6 months prior to admission? : No Suicidal Intent: No Has patient had any suicidal intent within the past 6 months prior to admission? : No Is patient at risk for suicide?: No Suicidal Plan?: No Has patient had any suicidal plan within the past 6 months prior to admission? : No Access to Means: No What has been your use of drugs/alcohol within the last 12 months?: none Previous Attempts/Gestures: No How many times?: 0 Other Self Harm Risks: none Triggers for Past Attempts: None known Intentional Self Injurious Behavior: None Family Suicide History: No Recent  stressful life event(s): Recent negative physical changes Persecutory voices/beliefs?: No Depression: No Substance abuse history and/or treatment for substance abuse?: No Suicide prevention information given to non-admitted patients: Not applicable  Risk to Others within the past 6 months Homicidal Ideation: No Does patient have any lifetime risk of violence toward others beyond the six months prior to admission? : No Thoughts of Harm to Others: No Current Homicidal Intent: No Current Homicidal Plan: No Access to Homicidal Means: No Identified Victim: NA History of harm to others?: No Assessment of Violence: None Noted Violent Behavior Description: none Does patient have access to weapons?: No Criminal Charges Pending?: No Does patient have a court date: No Is patient on probation?: No  Psychosis Hallucinations: None noted Delusions: None noted  Mental Status Report Appearance/Hygiene: In scrubs, Unremarkable Eye Contact: Fair Motor Activity: Tics Speech: Logical/coherent, Other (Comment) Level of Consciousness: Alert Mood: Pleasant Affect: Appropriate  to circumstance Anxiety Level: Moderate Thought Processes: Coherent, Relevant Judgement: Partial Orientation: Person, Place, Time, Situation Obsessive Compulsive Thoughts/Behaviors: None  Cognitive Functioning Concentration: Normal Memory: Recent Intact, Remote Intact Is patient IDD: No Insight: Good Impulse Control: Good Appetite: Good Have you had any weight changes? : No Change Sleep: No Change Total Hours of Sleep: 8 Vegetative Symptoms: None  ADLScreening [facility]) Patient's cognitive ability adequate to safely complete daily activities?: Yes Patient able to express need for assistance with ADLs?: Yes Independently performs ADLs?: Yes (appropriate for developmental age)  Prior Inpatient Therapy Prior Inpatient Therapy: Yes Prior Therapy Dates: 96 and early 1980's Prior Therapy  Facilty/Provider(s): unknown Reason for Treatment: severe depression and panic attacks  Prior Outpatient Therapy Prior Outpatient Therapy: Yes Prior Therapy Dates: current Prior Therapy Facilty/Provider(s): Leverne Humbles Reason for Treatment: "movement disorder" Does patient have an ACCT team?: No Does patient have Intensive In-House Services?  : No Does patient have [facility] services? : No Does patient have P4CC services?: No  ADL Screening (condition at time of admission) Patient's cognitive ability adequate to safely complete daily activities?: Yes Patient able to express need for assistance with ADLs?: Yes Independently performs ADLs?: Yes (appropriate for developmental age)       Abuse/Neglect Assessment (Assessment to be complete while patient is alone) Abuse/Neglect Assessment Can Be Completed: Yes Physical Abuse: Denies Verbal Abuse: Denies Sexual Abuse: Denies Exploitation of patient/patient's resources: Denies Self-Neglect: Denies Values / Beliefs Cultural Requests During Hospitalization: None Spiritual Requests During Hospitalization: None Consults Spiritual Care Consult Needed: No Social Work Consult Needed: No            Disposition:  Disposition Initial Assessment Completed for this Encounter: Yes  NP Kayren Eaves recommends the pt be discharged and follow up with Neurologist.  On Site Evaluation by:   Reviewed with Physician:    Ottis Stain 09/12/2018 6:36 PM

## 2018-09-12 NOTE — ED Notes (Addendum)
This RN received a message from Teodoro Kil, LPCA, stating that the patient was psych cleared. MD made aware by this RN.

## 2018-09-12 NOTE — Discharge Instructions (Signed)
Take your medicines as prescribed.   Call Dr. Maree Krabbe office tomorrow for appointment. Dr. Arlana Pouch will be able to access your records here.   Return to ER if you have worse trouble speaking, slurred speech, weakness, numbness

## 2018-09-12 NOTE — ED Triage Notes (Signed)
Pt BIB son. Pt's son reports patient's daughter spoke with her last evening and she sounded normal on the phone. Reports that this morning she wasn't able to speak appropriately and so he brought here here. Pt is unable to answer questions upon arrival to ED. Pt able to write and states that her speech is different that during previous episodes. Pt's daughter reports that the patient as a "movement disorder" and had an episode similar to this one about three years ago. Pt denies any headache or vision changes. Pt appears to have expressive aphasia at present. Pt's daughter reports the patient has been "passing out" at home prior to today.

## 2018-09-12 NOTE — ED Provider Notes (Addendum)
[facility]   CSN: [id number] Arrival date & time: 09/12/18  1214    History   Chief Complaint Chief Complaint  Patient presents with  . Altered Mental Status    HPI Doran Claynne K Manganello-Mezgar is a 68 y.o. female.     Patient presents with family member with acute alteration of mental status around 9 AM today. Patient started acting strange, making weird movements of bil hands and mouth, mouthing words, and trying to communicate with hands. History of similar episodes for years, per son. Patient very limited historian - level 5 caveat. Pt appears anxious.   The history is provided by the patient and a relative. The history is limited by the condition of the patient.  Altered Mental Status    Past Medical History:  Diagnosis Date  . Allergy   . Anxiety   . Arrhythmia    "does not see cardiologist, sees pcp, Dr. Alonza SmokerJeff Todd  . Asthma    DX IN MARCH 2012 "allergy induced asthma"  . Bronchitis    hx of  . Depression    "past hx of for Panic attacks, not on medication at this time"  . Family history of anesthesia complication    mother "nausea and vomiting post surger"  . GERD (gastroesophageal reflux disease)   . 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 Heartburn   . Herpes    on medication for outbreaks only  . Hypertension   . Seizures (HCC)    MEDICATION INDUCED  . Urinary tract infection    hx of    Patient Active Problem List   Diagnosis Date Noted  . 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 Abnormal involuntary movements 03/31/2015  . Syncope 04/29/2014  . Sinus tachycardia 11/08/2013  . Allergic rhinitis, seasonal 06/30/2011  . Fibrocystic breast changes 12/10/2010  . PALPITATIONS 04/15/2009  . ANXIETY 01/01/2008  . GERD 01/01/2008    Past Surgical History:  Procedure Laterality Date  . CESAREAN SECTION     x 2  .  COLONOSCOPY    . ELBOW SURGERY     right  . KNEE ARTHROSCOPY     right  . PILONIDAL CYST EXCISION    . spinal cystectomy     "where pilonidal cyst was"  . THYROIDECTOMY  04/07/2012   Procedure: THYROIDECTOMY;  Surgeon: Currie Parishristian J Streck, MD;  Location: [facility] OR;  Service: General;  Laterality: N/A;        OB History   No obstetric history on file.      Home Medications    Prior to Admission medications   Medication Sig Start Date End Date Taking? Authorizing Provider  acetaminophen (TYLENOL) 500 MG tablet Take 1,000 mg by mouth 2 (two) times daily.     [provider]  cetirizine (ZYRTEC) 10 MG tablet 10 mg. Take 1 tablet by mouth daily 04/23/15   [provider]  diazepam (VALIUM) 2 MG tablet Take 1 tablet (2 mg total) by mouth daily as needed (Sleep). 09/05/14   Roderick Peeodd, Jeffrey A, MD  fluticasone Aleda Grana(FLONASE) 50 MCG/ACT nasal spray 1 spray into each nostril daily 04/23/15   [provider]  lamoTRIgine (LAMICTAL) 25 MG tablet Take 1 tablet by mouth every evening for 2 weeks, then increase to 1 tablet by mouth twice a day for 2 weeks, then increase to 2 tablets by mouth twice a day and continue  06/16/15   Drema Dallas, DO  levothyroxine (SYNTHROID, LEVOTHROID) 100 MCG tablet TAKE 1 TABLET DAILY 08/24/16   Roderick Pee, MD  losartan (COZAAR) 25 MG tablet Take 25 mg by mouth daily.    [provider]  Melatonin 3 MG TABS Take 3 mg by mouth at bedtime.    [provider]  nadolol (CORGARD) 20 MG tablet 20 mg. Take 1 tablet daily 01/27/15   [provider]    Family History Family History  Problem Relation Age of Onset  . Heart failure Father   . Hypertension Father   . Seizures Father   . Heart disease Father   . Cervical cancer Mother   . Seizures Brother   . Irritable bowel syndrome Brother   . Cirrhosis Brother   . Hypertension Brother        x 4  . Fibromyalgia Sister   . Atrial fibrillation Sister   . Prostate cancer  Brother     Social History Social History   Tobacco Use  . Smoking status: Former Smoker    Last attempt to quit: 05/01/1999    Years since quitting: 19.3  . Smokeless tobacco: Never Used  Substance Use Topics  . Alcohol use: No    Alcohol/week: 0.0 standard drinks  . Drug use: No     Allergies   Cyclobenzaprine hcl and Norvasc [amlodipine besylate]   Review of Systems Review of Systems  Unable to perform ROS: Mental status change  level 5 caveat - altered mental status.    Physical Exam Updated Vital Signs There were no vitals taken for this visit.  Physical Exam Vitals signs and nursing note reviewed.  Constitutional:      Appearance: Normal appearance. She is well-developed.  HENT:     Head: Atraumatic.     Nose: Nose normal.     Mouth/Throat:     Mouth: Mucous membranes are moist.  Eyes:     General: No scleral icterus.    Conjunctiva/sclera: Conjunctivae normal.     Pupils: Pupils are equal, round, and reactive to light.  Neck:     Musculoskeletal: Normal range of motion and neck supple. No neck rigidity or muscular tenderness.     Trachea: No tracheal deviation.  Cardiovascular:     Rate and Rhythm: Normal rate and regular rhythm.     Pulses: Normal pulses.     Heart sounds: Normal heart sounds. No murmur. No friction rub. No gallop.   Pulmonary:     Effort: Pulmonary effort is normal. No respiratory distress.     Breath sounds: Normal breath sounds.  Abdominal:     General: Bowel sounds are normal. There is no distension.     Palpations: Abdomen is soft.     Tenderness: There is no abdominal tenderness. There is no guarding.  Genitourinary:    Comments: No cva tenderness.  Musculoskeletal:        General: No swelling.  Skin:    General: Skin is warm and dry.     Findings: No rash.  Neurological:     Mental Status: She is alert.     Comments: Awake and alert. Appears to understand speech. Communicates using combination of moving mouth, and  making hand signals. Makes odd, gyrating, rigid, movements of bilateral arms and legs. Makes purposeful movements of bil extremities.   Psychiatric:     Comments: Very anxious. Unusual affect.       ED Treatments / Results  Labs (all  labs ordered are listed, but only abnormal results are displayed) Results for orders placed or performed during the hospital encounter of 09/12/18  CBC  Result Value Ref Range   WBC 4.3 4.0 - 10.5 K/uL   RBC 4.35 3.87 - 5.11 MIL/uL   Hemoglobin 14.0 12.0 - 15.0 g/dL   HCT 32.3 55.7 - 32.2 %   MCV 92.6 80.0 - 100.0 fL   MCH 32.2 26.0 - 34.0 pg   MCHC 34.7 30.0 - 36.0 g/dL   RDW 02.5 42.7 - 06.2 %   Platelets 244 150 - 400 K/uL   nRBC 0.0 0.0 - 0.2 %  Basic metabolic panel  Result Value Ref Range   Sodium 136 135 - 145 mmol/L   Potassium 4.3 3.5 - 5.1 mmol/L   Chloride 103 98 - 111 mmol/L   CO2 29 22 - 32 mmol/L   Glucose, Bld 99 70 - 99 mg/dL   BUN 11 8 - 23 mg/dL   Creatinine, Ser 3.76 0.44 - 1.00 mg/dL   Calcium 9.4 8.9 - 28.3 mg/dL   GFR calc non Af Amer >60 >60 mL/min   GFR calc Af Amer >60 >60 mL/min   Anion gap 4 (L) 5 - 15  CBG monitoring, ED  Result Value Ref Range   Glucose-Capillary 67 (L) 70 - 99 mg/dL   EKG EKG Interpretation  Date/Time:  Tuesday September 12 2018 12:25:56 EST Ventricular Rate:  71 PR Interval:    QRS Duration: 93 QT Interval:  403 QTC Calculation: 438 R Axis:   49 Text Interpretation:  Sinus rhythm Nonspecific T wave abnormality No significant change since last tracing Confirmed by Cathren Laine ([id number]) on 09/12/2018 12:33:57 PM   Radiology No results found.  Procedures Procedures (including critical care time)  Medications Ordered in ED Medications  LORazepam (ATIVAN) injection 1 mg (1 mg Intravenous Given 09/12/18 1242)     Initial Impression / Assessment and Plan / ED Course  I have reviewed the triage vital signs and the nursing notes.  Pertinent labs & imaging results that were available during  my care of the patient were reviewed by me and considered in my medical decision making (see chart for details).  Iv ns. Labs sent. Continuous pulse ox and monitor.   Reviewed nursing notes and prior charts for additional history.  Charts reviewed - long history of 'functional movement and speech disorder'.   Ativan 1 mg iv.   Recheck, pt is calmer, is speaking more normal and odd movements less.  Labs reviewed - chem normal.   Pt requests additional ativan. 1 mg iv.  Pt does not recent increased stressors/anxiety, family note recent increased frequency of episodes c/w her prior functional movement/conversion disorder diagnosis.  Will get [facility] team to assess.  BH eval pending. Disposition per [facility] team.  Signed out to Dr Silverio Lay to f/u with [facility] eval, recheck pt, and dispo appropriately.   Final Clinical Impressions(s) / ED Diagnoses   Final diagnoses:  None    ED Discharge Orders    None          Cathren Laine, MD 09/12/18 1550

## 2018-09-12 NOTE — ED Notes (Addendum)
RN informed Pt can receive visitor  

## 2019-02-09 ENCOUNTER — Other Ambulatory Visit: Payer: Self-pay | Admitting: Family Medicine

## 2019-02-09 DIAGNOSIS — Z1231 Encounter for screening mammogram for malignant neoplasm of breast: Secondary | ICD-10-CM

## 2019-03-13 ENCOUNTER — Ambulatory Visit: Payer: Managed Care, Other (non HMO)

## 2019-04-25 ENCOUNTER — Other Ambulatory Visit: Payer: Self-pay

## 2019-04-25 ENCOUNTER — Ambulatory Visit
Admission: RE | Admit: 2019-04-25 | Discharge: 2019-04-25 | Disposition: A | Discharge status: Home / Self Care | Payer: Managed Care, Other (non HMO) | Source: Ambulatory Visit | Attending: Family Medicine | Admitting: Family Medicine

## 2019-04-25 DIAGNOSIS — Z1231 Encounter for screening mammogram for malignant neoplasm of breast: Secondary | ICD-10-CM

## 2019-08-10 ENCOUNTER — Other Ambulatory Visit: Payer: Self-pay | Admitting: Otolaryngology

## 2019-08-10 DIAGNOSIS — H9311 Tinnitus, right ear: Secondary | ICD-10-CM

## 2019-08-10 DIAGNOSIS — H9041 Sensorineural hearing loss, unilateral, right ear, with unrestricted hearing on the contralateral side: Secondary | ICD-10-CM

## 2019-08-10 DIAGNOSIS — IMO0001 Reserved for inherently not codable concepts without codable children: Secondary | ICD-10-CM

## 2019-08-20 ENCOUNTER — Other Ambulatory Visit: Payer: Self-pay

## 2019-08-27 ENCOUNTER — Ambulatory Visit: Payer: Managed Care, Other (non HMO) | Attending: Internal Medicine

## 2019-08-27 DIAGNOSIS — Z20822 Contact with and (suspected) exposure to covid-19: Secondary | ICD-10-CM

## 2019-08-28 LAB — NOVEL CORONAVIRUS, NAA: SARS-CoV-2, NAA: NOT DETECTED

## 2019-10-01 ENCOUNTER — Other Ambulatory Visit: Payer: Self-pay

## 2019-10-01 ENCOUNTER — Ambulatory Visit
Admission: RE | Admit: 2019-10-01 | Discharge: 2019-10-01 | Disposition: A | Discharge status: Home / Self Care | Payer: Managed Care, Other (non HMO) | Source: Ambulatory Visit | Attending: Otolaryngology | Admitting: Otolaryngology

## 2019-10-01 DIAGNOSIS — H9041 Sensorineural hearing loss, unilateral, right ear, with unrestricted hearing on the contralateral side: Secondary | ICD-10-CM

## 2019-10-01 DIAGNOSIS — IMO0001 Reserved for inherently not codable concepts without codable children: Secondary | ICD-10-CM

## 2019-10-01 DIAGNOSIS — H9311 Tinnitus, right ear: Secondary | ICD-10-CM

## 2019-10-01 MED ORDER — GADOBENATE DIMEGLUMINE 529 MG/ML IV SOLN
15.0000 mL | Freq: Once | INTRAVENOUS | Status: AC | PRN
  Administered 2019-10-01: 15 mL via INTRAVENOUS

## 2020-05-13 ENCOUNTER — Other Ambulatory Visit: Payer: Self-pay | Admitting: Family Medicine

## 2020-05-13 DIAGNOSIS — Z1231 Encounter for screening mammogram for malignant neoplasm of breast: Secondary | ICD-10-CM

## 2020-05-14 ENCOUNTER — Other Ambulatory Visit: Payer: Self-pay

## 2020-05-14 ENCOUNTER — Ambulatory Visit
Admission: RE | Admit: 2020-05-14 | Discharge: 2020-05-14 | Disposition: A | Discharge status: Home / Self Care | Payer: Medicare Other | Source: Ambulatory Visit | Attending: Family Medicine | Admitting: Family Medicine

## 2020-05-14 DIAGNOSIS — Z1231 Encounter for screening mammogram for malignant neoplasm of breast: Secondary | ICD-10-CM

## 2020-11-25 ENCOUNTER — Ambulatory Visit
Admission: RE | Admit: 2020-11-25 | Discharge: 2020-11-25 | Disposition: A | Discharge status: Home / Self Care | Payer: Medicare Other | Source: Ambulatory Visit | Attending: Family Medicine | Admitting: Family Medicine

## 2020-11-25 ENCOUNTER — Other Ambulatory Visit: Payer: Self-pay | Admitting: Family Medicine

## 2020-11-25 ENCOUNTER — Other Ambulatory Visit: Payer: Self-pay

## 2020-11-25 DIAGNOSIS — R0602 Shortness of breath: Secondary | ICD-10-CM

## 2020-11-28 ENCOUNTER — Other Ambulatory Visit: Payer: Self-pay | Admitting: Family Medicine

## 2020-12-05 ENCOUNTER — Other Ambulatory Visit: Payer: Self-pay

## 2020-12-05 ENCOUNTER — Encounter: Payer: Self-pay | Admitting: Cardiovascular Disease

## 2020-12-05 ENCOUNTER — Ambulatory Visit (INDEPENDENT_AMBULATORY_CARE_PROVIDER_SITE_OTHER): Payer: Medicare Other | Admitting: Cardiovascular Disease

## 2020-12-05 VITALS — BP 128/92 | HR 88 | Ht 65.0 in | Wt 156.0 lb

## 2020-12-05 DIAGNOSIS — R000 Tachycardia, unspecified: Secondary | ICD-10-CM | POA: Diagnosis not present

## 2020-12-05 DIAGNOSIS — R002 Palpitations: Secondary | ICD-10-CM

## 2020-12-05 NOTE — Progress Notes (Signed)
Cardiology Office Note:    Date:  12/05/2020   ID:  Colleen [name], DOB 05-24-1951, MRN [id number]  PCP:  Colleen Mylar, MD   [facility] Providers Cardiologist:  None     Referring MD: Colleen Mylar, MD   Chief Complaint  Patient presents with  . New Patient (Initial Visit)  Colleen [name] is a 70 y.o. female who is being seen today for the evaluation of fatigue and palpitations at the request of Colleen Mylar, MD.   History of Present Illness:    Colleen [name] is a 70 y.o. female with a hx of longstanding, mildly symptomatic PVCs who presents with complaints of fatigue.  She had COVID-19 infection in early 2022 and has not recovered.  She feels extremely tired even after simple household chores.  She has not had true dizziness or syncope and the palpitations are not bothering her anymore than before.  She has never experienced chest tightness or pressure either at rest or with activity.  She denies orthopnea or PND or leg edema.  Although her activity is limited, she makes it clear that this is due to tiredness rather than shortness of breath.  Believe episodes of rapid palpitations occurring at rest.  One of them woke her from sleep.  The other 1 occurred while she was sitting on the couch.  On both occasions the "arrhythmia" resolved very gradually over a long period of time.  Her heart rate was in the high 90s.  Had daily nausea and some dyspnea over the last 10 days or so.  Taking proton pump inhibitors makes the nausea worse.  She reports having a stress test in 2019 in [place] which was interpreted as being normal.  We do not have a copy of that study.  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 Lieutenant [name] at [facility], which causes uncontrollable limb movements at times.  In the emergency room in early March with inability to speak, improved after administration of a benzodiazepine, normal head CT.  She has  hypothyroidism, with normal TSH on levothyroxine supplement.  She carries a diagnosis of generalized anxiety disorder and depression.  Her ECG today shows normal sinus rhythm at 88 bpm and is a completely normal tracing.  Recent labs show a normal routine chemistries, and excellent HDL cholesterol with fair LDL at 106, normal thyroid function tests and liver function tests.  Her hypertension is well controlled on losartan.  She is originally from [place].  Past Medical History:  Diagnosis Date  . Allergy   . Anxiety   . Arrhythmia    "does not see cardiologist, sees pcp, Colleen [name]  . Asthma    DX IN MARCH 2012 "allergy induced asthma"  . Bronchitis    hx of  . Depression    "past hx of for Panic attacks, not on medication at this time"  . Family history of anesthesia complication    mother "nausea and vomiting post surger"  . GERD (gastroesophageal reflux disease)   . 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 Heartburn   . Herpes    on medication for outbreaks only  . Hypertension   . Seizures (HCC)    MEDICATION INDUCED  . Urinary tract infection    hx of    Past Surgical History:  Procedure Laterality Date  . CESAREAN SECTION     x  2  . COLONOSCOPY    . ELBOW SURGERY     right  . KNEE ARTHROSCOPY     right  . PILONIDAL CYST EXCISION    . spinal cystectomy     "where pilonidal cyst was"  . THYROIDECTOMY  04/07/2012   Procedure: THYROIDECTOMY;  Surgeon: Colleen Paris, MD;  Location: [facility] OR;  Service: General;  Laterality: N/A;       Current Medications: Current Meds  Medication Sig  . acetaminophen (TYLENOL) 500 MG tablet Take 500-1,000 mg by mouth every 8 (eight) hours as needed for mild pain or headache.   . ALPRAZolam (XANAX) 0.5 MG tablet Take 0.5 mg by mouth 2 (two) times daily.  . ferrous sulfate 325 (65 FE) MG tablet Take 325 mg by mouth daily with  breakfast.  . GAMMA AMINOBUTYRIC ACID PO Take 1 tablet by mouth daily.  Marland Kitchen levothyroxine (SYNTHROID, LEVOTHROID) 100 MCG tablet TAKE 1 TABLET DAILY  . losartan (COZAAR) 50 MG tablet Take 50 mg by mouth daily.  . Multiple Vitamin (MULTIVITAMIN) tablet Take 1 tablet by mouth in the morning and at bedtime.     Allergies:   Amlodipine, Cyclobenzaprine hcl, Lamictal [lamotrigine], and Norvasc [amlodipine besylate]   Social History   Socioeconomic History  . Marital status: Divorced    Spouse name: Not on file  . Number of children: 2  . Years of education: Not on file  . Highest education level: Not on file  Occupational History  . Occupation: Patient Care Customer Service    Employer: [employer]  Tobacco Use  . Smoking status: Former [name]    Quit date: 05/01/1999    Years since quitting: 21.6  . Smokeless tobacco: Never Used  Substance and Sexual Activity  . Alcohol use: No    Alcohol/week: 0.0 standard drinks  . Drug use: No  . Sexual activity: Yes    Partners: Male  Other Topics Concern  . Not on file  Social History Narrative  . Not on file   Social Determinants of Health   Financial Resource Strain: Not on file  Food Insecurity: Not on file  Transportation Needs: Not on file  Physical Activity: Not on file  Stress: Not on file  Social Connections: Not on file     Family History: The patient's family history includes Atrial fibrillation in her sister; Cervical cancer in her mother; Cirrhosis in her brother; Fibromyalgia in her sister; Heart disease in her father; Heart failure in her father; Hypertension in her brother and father; Irritable bowel syndrome in her brother; Prostate cancer in her brother; Seizures in her brother and father. There is no history of Breast cancer.  ROS:   Please see the history of present illness.     All other systems reviewed and are negative.  EKGs/Labs/Other Studies Reviewed:    The following studies were reviewed today: Notes from  Dr. Shirlean Mylar, MD.  Notes from emergency room evaluation.  EKG:  EKG is  ordered today.  The ekg ordered today demonstrates sinus rhythm, normal tracing  Recent Labs: No results found for requested labs within last 8760 hours.  Recent Lipid Panel    Component Value Date/Time   CHOL 199 03/29/2013 0933   TRIG 287.0 (H) 03/29/2013 0933   HDL 45.50 03/29/2013 0933   CHOLHDL 4 03/29/2013 0933   VLDL 57.4 (H) 03/29/2013 0933   LDLCALC 121 (H) 11/09/2007 0832   LDLDIRECT 109.9 03/29/2013 0933     Risk Assessment/Calculations:  Physical Exam:    VS:  BP (!) 128/92 (BP Location: Left Arm, Patient Position: Sitting, Cuff Size: Normal)   Pulse 88   Ht 5\' 5"  (1.651 m)   Wt 156 lb (70.8 kg)   BMI 25.96 kg/m     Wt Readings from Last 3 Encounters:  12/05/20 156 lb (70.8 kg)  04/03/18 155 lb (70.3 kg)  05/01/15 162 lb (73.5 kg)     GEN:  Well nourished, well developed in no acute distress HEENT: Normal NECK: No JVD; No carotid bruits LYMPHATICS: No lymphadenopathy CARDIAC: RRR, no murmurs, rubs, gallops RESPIRATORY:  Clear to auscultation without rales, wheezing or rhonchi  ABDOMEN: Soft, non-tender, non-distended MUSCULOSKELETAL:  No edema; No deformity  SKIN: Warm and dry NEUROLOGIC:  Alert and oriented x 3 PSYCHIATRIC:  Normal affect   ASSESSMENT:    1. Tachycardia    PLAN:    In order of problems listed above:  1. Palpitations: She appears to be describing mild sinus tachycardia, not a true arrhythmia.  The onset and resolution of the palpitations is very gradual and the rhythm is apparently regular.  Described the fact that sinus tachycardia usually has an extracardiac cause such as infection, pain, anxiety, anemia, etc.  Her thyroid function is well compensated.  Her physical exam is normal and her ECG is entirely normal and shows sinus rhythm.  I offered an echocardiogram to more confidently exclude structural heart disease, but she appeared reassured by my  interpretation of her symptoms and declined the echocardiogram.        Medication Adjustments/Labs and Tests Ordered: Current medicines are reviewed at length with the patient today.  Concerns regarding medicines are outlined above.  Orders Placed This Encounter  Procedures  . EKG 12-Lead   No orders of the defined types were placed in this encounter.   Patient Instructions  Medication Instructions:  No changes *If you need a refill on your cardiac medications before your next appointment, please call your pharmacy*   Lab Work: None ordered If you have labs (blood work) drawn today and your tests are completely normal, you will receive your results only by: 05/03/15 MyChart Message (if you have MyChart) OR . A paper copy in the mail If you have any lab test that is abnormal or we need to change your treatment, we will call you to review the results.   Testing/Procedures: None ordered   Follow-Up: At [facility], you and your health needs are our priority.  As part of our continuing mission to provide you with exceptional heart care, we have created designated Provider Care Teams.  These Care Teams include your primary Cardiologist (physician) and Advanced Practice Providers (APPs -  Physician Assistants and Nurse Practitioners) who all work together to provide you with the care you need, when you need it.  We recommend signing up for the patient portal called "MyChart".  Sign up information is provided on this After Visit Summary.  MyChart is used to connect with patients for Virtual Visits (Telemedicine).  Patients are able to view lab/test results, encounter notes, upcoming appointments, etc.  Non-urgent messages can be sent to your provider as well.   To learn more about what you can do with MyChart, go to [facility].    Your next appointment:   Follow up as needed with [email or web address]     Signed, Royann Shivers, MD  12/05/2020 5:39 PM    [facility]

## 2020-12-05 NOTE — Patient Instructions (Signed)
Medication Instructions:  No changes *If you need a refill on your cardiac medications before your next appointment, please call your pharmacy*   Lab Work: None ordered If you have labs (blood work) drawn today and your tests are completely normal, you will receive your results only by: MyChart Message (if you have MyChart) OR A paper copy in the mail If you have any lab test that is abnormal or we need to change your treatment, we will call you to review the results.   Testing/Procedures: None ordered   Follow-Up: At [facility], you and your health needs are our priority.  As part of our continuing mission to provide you with exceptional heart care, we have created designated Provider Care Teams.  These Care Teams include your primary Cardiologist (physician) and Advanced Practice Providers (APPs -  Physician Assistants and Nurse Practitioners) who all work together to provide you with the care you need, when you need it.  We recommend signing up for the patient portal called "MyChart".  Sign up information is provided on this After Visit Summary.  MyChart is used to connect with patients for Virtual Visits (Telemedicine).  Patients are able to view lab/test results, encounter notes, upcoming appointments, etc.  Non-urgent messages can be sent to your provider as well.   To learn more about what you can do with MyChart, go to [email or web address].    Your next appointment:   Follow up as needed with Dr. Croitoru  

## 2021-01-09 ENCOUNTER — Other Ambulatory Visit: Payer: Self-pay | Admitting: Family Medicine

## 2021-01-09 DIAGNOSIS — R1011 Right upper quadrant pain: Secondary | ICD-10-CM

## 2021-06-15 ENCOUNTER — Other Ambulatory Visit: Payer: Self-pay | Admitting: Family Medicine

## 2021-06-15 DIAGNOSIS — Z1231 Encounter for screening mammogram for malignant neoplasm of breast: Secondary | ICD-10-CM

## 2021-07-21 ENCOUNTER — Ambulatory Visit
Admission: RE | Admit: 2021-07-21 | Discharge: 2021-07-21 | Disposition: A | Discharge status: Home / Self Care | Payer: Medicare Other | Source: Ambulatory Visit | Attending: Family Medicine | Admitting: Family Medicine

## 2021-07-21 DIAGNOSIS — Z1231 Encounter for screening mammogram for malignant neoplasm of breast: Secondary | ICD-10-CM

## 2021-07-22 ENCOUNTER — Other Ambulatory Visit: Payer: Self-pay | Admitting: Family Medicine

## 2021-07-22 DIAGNOSIS — N6320 Unspecified lump in the left breast, unspecified quadrant: Secondary | ICD-10-CM

## 2021-08-24 ENCOUNTER — Other Ambulatory Visit: Payer: Medicare Other

## 2021-09-02 ENCOUNTER — Emergency Department (HOSPITAL_COMMUNITY)
Admission: EM | Admit: 2021-09-02 | Discharge: 2021-09-03 | Disposition: A | Discharge status: Home / Self Care | Payer: Medicare Other | Attending: Emergency Medicine | Admitting: Emergency Medicine

## 2021-09-02 DIAGNOSIS — F332 Major depressive disorder, recurrent severe without psychotic features: Secondary | ICD-10-CM | POA: Insufficient documentation

## 2021-09-02 DIAGNOSIS — Z20822 Contact with and (suspected) exposure to covid-19: Secondary | ICD-10-CM | POA: Insufficient documentation

## 2021-09-02 DIAGNOSIS — R45851 Suicidal ideations: Secondary | ICD-10-CM | POA: Diagnosis not present

## 2021-09-02 DIAGNOSIS — R251 Tremor, unspecified: Secondary | ICD-10-CM | POA: Diagnosis present

## 2021-09-02 DIAGNOSIS — Z79899 Other long term (current) drug therapy: Secondary | ICD-10-CM | POA: Diagnosis not present

## 2021-09-02 DIAGNOSIS — E039 Hypothyroidism, unspecified: Secondary | ICD-10-CM | POA: Insufficient documentation

## 2021-09-02 LAB — URINALYSIS, ROUTINE W REFLEX MICROSCOPIC
Bilirubin Urine: NEGATIVE
Glucose, UA: NEGATIVE mg/dL
Hgb urine dipstick: NEGATIVE
Ketones, ur: 20 mg/dL — AB
Nitrite: NEGATIVE
Protein, ur: NEGATIVE mg/dL
Specific Gravity, Urine: 1.017 (ref 1.005–1.030)
pH: 5 (ref 5.0–8.0)

## 2021-09-02 LAB — TSH: TSH: 3.429 u[IU]/mL (ref 0.350–4.500)

## 2021-09-02 LAB — ETHANOL: Alcohol, Ethyl (B): <10 mg/dL (ref <10)

## 2021-09-02 LAB — COMPREHENSIVE METABOLIC PANEL
ALT: 27 U/L (ref 0–44)
AST: 27 U/L (ref 15–41)
Albumin: 4.4 g/dL (ref 3.5–5.0)
Alkaline Phosphatase: 59 U/L (ref 38–126)
Anion gap: 12 (ref 5–15)
BUN: 9 mg/dL (ref 8–23)
CO2: 23 mmol/L (ref 22–32)
Calcium: 9.7 mg/dL (ref 8.9–10.3)
Chloride: 96 mmol/L — ABNORMAL LOW (ref 98–111)
Creatinine, Ser: 0.97 mg/dL (ref 0.44–1.00)
GFR, Estimated: >60 mL/min (ref >60)
Glucose, Bld: 108 mg/dL — ABNORMAL HIGH (ref 70–99)
Potassium: 3.7 mmol/L (ref 3.5–5.1)
Sodium: 131 mmol/L — ABNORMAL LOW (ref 135–145)
Total Bilirubin: 0.6 mg/dL (ref 0.3–1.2)
Total Protein: 7.5 g/dL (ref 6.5–8.1)

## 2021-09-02 LAB — RAPID URINE DRUG SCREEN, HOSP PERFORMED
Amphetamines: NOT DETECTED
Barbiturates: NOT DETECTED
Benzodiazepines: POSITIVE — AB
Cocaine: NOT DETECTED
Opiates: NOT DETECTED
Tetrahydrocannabinol: NOT DETECTED

## 2021-09-02 LAB — CBC WITH DIFFERENTIAL/PLATELET
Abs Immature Granulocytes: 0.01 10*3/uL (ref 0.00–0.07)
Basophils Absolute: 0 10*3/uL (ref 0.0–0.1)
Basophils Relative: 0 %
Eosinophils Absolute: 0 10*3/uL (ref 0.0–0.5)
Eosinophils Relative: 0 %
HCT: 38 % (ref 36.0–46.0)
Hemoglobin: 13.5 g/dL (ref 12.0–15.0)
Immature Granulocytes: 0 %
Lymphocytes Relative: 18 %
Lymphs Abs: 1.1 10*3/uL (ref 0.7–4.0)
MCH: 32.8 pg (ref 26.0–34.0)
MCHC: 35.5 g/dL (ref 30.0–36.0)
MCV: 92.5 fL (ref 80.0–100.0)
Monocytes Absolute: 0.4 10*3/uL (ref 0.1–1.0)
Monocytes Relative: 7 %
Neutro Abs: 4.4 10*3/uL (ref 1.7–7.7)
Neutrophils Relative %: 75 %
Platelets: 293 10*3/uL (ref 150–400)
RBC: 4.11 MIL/uL (ref 3.87–5.11)
RDW: 11.8 % (ref 11.5–15.5)
WBC: 6 10*3/uL (ref 4.0–10.5)
nRBC: 0 % (ref 0.0–0.2)

## 2021-09-02 LAB — RESP PANEL BY RT-PCR (FLU A&B, COVID) ARPGX2
Influenza A by PCR: NEGATIVE
Influenza B by PCR: NEGATIVE
SARS Coronavirus 2 by RT PCR: NEGATIVE

## 2021-09-02 LAB — SALICYLATE LEVEL: Salicylate Lvl: <7 mg/dL — ABNORMAL LOW (ref 7.0–30.0)

## 2021-09-02 LAB — ACETAMINOPHEN LEVEL: Acetaminophen (Tylenol), Serum: <10 ug/mL — ABNORMAL LOW (ref 10–30)

## 2021-09-02 MED ORDER — ADULT MULTIVITAMIN W/MINERALS CH
1.0000 | ORAL_TABLET | Freq: Every day | ORAL | Status: DC
  Administered 2021-09-03: 1 via ORAL
  Filled 2021-09-02: qty 1

## 2021-09-02 MED ORDER — LEVOTHYROXINE SODIUM 112 MCG PO TABS: 112.0000 ug | ORAL_TABLET | ORAL | Status: DC

## 2021-09-02 MED ORDER — LORAZEPAM 2 MG/ML IJ SOLN
1.0000 mg | Freq: Once | INTRAMUSCULAR | Status: AC
  Administered 2021-09-02: 1 mg via INTRAVENOUS
  Filled 2021-09-02: qty 1

## 2021-09-02 MED ORDER — ACETAMINOPHEN 500 MG PO TABS
500.0000 mg | ORAL_TABLET | Freq: Three times a day (TID) | ORAL | Status: DC | PRN
  Administered 2021-09-02: 1000 mg via ORAL
  Filled 2021-09-02: qty 2

## 2021-09-02 MED ORDER — DULOXETINE HCL 20 MG PO CPEP
20.0000 mg | ORAL_CAPSULE | Freq: Two times a day (BID) | ORAL | Status: DC
  Administered 2021-09-02 – 2021-09-03 (×2): 20 mg via ORAL
  Filled 2021-09-02 (×3): qty 1

## 2021-09-02 MED ORDER — LEVOTHYROXINE SODIUM 100 MCG PO TABS
100.0000 ug | ORAL_TABLET | ORAL | Status: DC
  Administered 2021-09-03: 100 ug via ORAL
  Filled 2021-09-02: qty 1

## 2021-09-02 MED ORDER — SODIUM CHLORIDE 0.9 % IV BOLUS
1000.0000 mL | Freq: Once | INTRAVENOUS | Status: AC
  Administered 2021-09-02: 1000 mL via INTRAVENOUS

## 2021-09-02 MED ORDER — LOSARTAN POTASSIUM 50 MG PO TABS
50.0000 mg | ORAL_TABLET | Freq: Every day | ORAL | Status: DC
Start: 2021-09-02 — End: 2021-09-03
  Administered 2021-09-03: 50 mg via ORAL
  Filled 2021-09-02 (×2): qty 1

## 2021-09-02 MED ORDER — LEVOTHYROXINE SODIUM 100 MCG PO TABS: 100.0000 ug | ORAL_TABLET | ORAL | Status: DC

## 2021-09-02 MED ORDER — ALPRAZOLAM 0.5 MG PO TABS
0.5000 mg | ORAL_TABLET | Freq: Two times a day (BID) | ORAL | Status: DC
  Administered 2021-09-02 – 2021-09-03 (×2): 0.5 mg via ORAL
  Filled 2021-09-02 (×2): qty 1

## 2021-09-02 NOTE — ED Triage Notes (Incomplete)
Patient BIB [facility] from PCP's office for suicidal ideations x4 days, plan to take approximately 90 of prescribed xanax tablets. Per EMS tablets were taken away from the patient by her daughter upon hearing about the plan.

## 2021-09-02 NOTE — ED Notes (Signed)
Pt stated she has had abnormal, involuntary jerking movements to her body that started earlier this AM. Pt states hx of same and that ativan helps. Denies ETOH or drug use. A/o x4. Pt also endorses SI for 4 days, states she has some xanax at home she thought about taking but gave them to her daughter.

## 2021-09-02 NOTE — ED Notes (Signed)
Patient uncooperative during triage, combative with staff attempting to measure vital signs.

## 2021-09-02 NOTE — ED Notes (Signed)
Security at bedside to wand pt. 

## 2021-09-02 NOTE — ED Notes (Addendum)
Pt belongings inventoried and placed in purple zone locker #5. Valuables given to security. Security called to wand pt.

## 2021-09-02 NOTE — ED Provider Notes (Signed)
[facility] EMERGENCY DEPARTMENT Provider Note   CSN: [id number] Arrival date & time: 09/02/21  1223     History  Chief Complaint  Patient presents with   Suicidal    Colleen [name] is a 71 y.o. female.  Pt is a 70 yo wf with a hx of gerd, anxiety, hypothyroidism, depression, and involuntary movement d/o.  Pt said her doctor has been changing her thyroid medication around because her levels have been off.  She feels like this is causing her depression.  She feels more twitchy than normal and also feels suicidal.  She has thought about taking an overdose of Xanax.      Home Medications Prior to Admission medications   Medication Sig Start Date End Date Taking? Authorizing Provider  acetaminophen (TYLENOL) 500 MG tablet Take 500-1,000 mg by mouth every 8 (eight) hours as needed for mild pain or headache.    Yes [provider]  ALPRAZolam Duanne Moron) 0.5 MG tablet Take 0.5 mg by mouth 2 (two) times daily.   Yes [provider]  DULoxetine (CYMBALTA) 20 MG capsule Take 20 mg by mouth 2 (two) times daily. 08/25/21  Yes [provider]  levothyroxine (SYNTHROID) 112 MCG tablet Take 112 mcg by mouth See admin instructions. 171mcg once daily Monday's, Wednesday's, Friday's, and Sunday's. 09/01/21  Yes [provider]  levothyroxine (SYNTHROID, LEVOTHROID) 100 MCG tablet TAKE 1 TABLET DAILY Patient taking differently: Take 100 mcg by mouth See admin instructions. 121mcg once daily every Tuesday's, Thursday's, and Saturday's 08/24/16  Yes Dorena Cookey, MD  losartan (COZAAR) 100 MG tablet Take 50 mg by mouth daily. 07/05/21  Yes [provider]  Multiple Vitamin (MULTIVITAMIN) tablet Take 1 tablet by mouth in the morning and at bedtime.   Yes [provider]  OVER THE COUNTER MEDICATION Take 1 capsule by mouth daily. Gaba supplement   Yes [provider]      Allergies    Cyclobenzaprine hcl, Lamictal  [lamotrigine], Norvasc [amlodipine besylate], Sertraline, Trazodone, and Tape    Review of Systems   Review of Systems  Psychiatric/Behavioral:  Positive for suicidal ideas. The patient is nervous/anxious.   All other systems reviewed and are negative.  Physical Exam Updated Vital Signs BP (!) 148/89    Pulse (!) 104    Temp 98.8 F (37.1 C) (Oral)    Resp 16    SpO2 98%  Physical Exam Vitals and nursing note reviewed.  Constitutional:      Appearance: Normal appearance.  HENT:     Head: Normocephalic and atraumatic.     Right Ear: External ear normal.     Left Ear: External ear normal.     Nose: Nose normal.     Mouth/Throat:     Mouth: Mucous membranes are moist.     Pharynx: Oropharynx is clear.  Eyes:     Extraocular Movements: Extraocular movements intact.     Conjunctiva/sclera: Conjunctivae normal.     Pupils: Pupils are equal, round, and reactive to light.  Cardiovascular:     Rate and Rhythm: Normal rate and regular rhythm.     Pulses: Normal pulses.     Heart sounds: Normal heart sounds.  Pulmonary:     Effort: Pulmonary effort is normal.     Breath sounds: Normal breath sounds.  Abdominal:     General: Abdomen is flat. Bowel sounds are normal.     Palpations: Abdomen is soft.  Musculoskeletal:  General: Normal range of motion.     Cervical back: Normal range of motion and neck supple.  Skin:    General: Skin is warm.     Capillary Refill: Capillary refill takes less than 2 seconds.  Neurological:     Mental Status: She is alert.     Motor: Tremor present.     Comments: Tremors get worse when one talks to pt.  In the room by herself, she does not twitch   Psychiatric:        Mood and Affect: Mood is anxious.        Thought Content: Thought content includes suicidal ideation. Thought content includes suicidal plan.    ED Results / Procedures / Treatments   Labs (all labs ordered are listed, but only abnormal results are displayed) Labs Reviewed   COMPREHENSIVE METABOLIC PANEL - Abnormal; Notable for the following components:      Result Value   Sodium 131 (*)    Chloride 96 (*)    Glucose, Bld 108 (*)    All other components within normal limits  RAPID URINE DRUG SCREEN, HOSP PERFORMED - Abnormal; Notable for the following components:   Benzodiazepines POSITIVE (*)    All other components within normal limits  SALICYLATE LEVEL - Abnormal; Notable for the following components:   Salicylate Lvl Q000111Q (*)    All other components within normal limits  ACETAMINOPHEN LEVEL - Abnormal; Notable for the following components:   Acetaminophen (Tylenol), Serum <10 (*)    All other components within normal limits  URINALYSIS, ROUTINE W REFLEX MICROSCOPIC - Abnormal; Notable for the following components:   APPearance HAZY (*)    Ketones, ur 20 (*)    Leukocytes,Ua LARGE (*)    Bacteria, UA FEW (*)    Non Squamous Epithelial 0-5 (*)    All other components within normal limits  RESP PANEL BY RT-PCR (FLU A&B, COVID) ARPGX2  ETHANOL  CBC WITH DIFFERENTIAL/PLATELET  TSH    EKG EKG Interpretation  Date/Time:  Wednesday September 02 2021 12:52:29 EST Ventricular Rate:  120 PR Interval:  158 QRS Duration: 88 QT Interval:  311 QTC Calculation: 438 R Axis:   57 Text Interpretation: Sinus tachycardia RSR' in V1 or V2, right VCD or RVH Borderline T abnormalities, anterior leads Since last tracing rate faster Confirmed by Isla Pence [phone]) on 09/02/2021 5:18:54 PM  Radiology No results found.  Procedures Procedures    Medications Ordered in ED Medications  acetaminophen (TYLENOL) tablet 500-1,000 mg (has no administration in time range)  ALPRAZolam (XANAX) tablet 0.5 mg (has no administration in time range)  DULoxetine (CYMBALTA) DR capsule 20 mg (has no administration in time range)  levothyroxine (SYNTHROID) tablet 112 mcg (has no administration in time range)  losartan (COZAAR) tablet 50 mg (has no administration in time  range)  multivitamin with minerals tablet 1 tablet (has no administration in time range)  levothyroxine (SYNTHROID) tablet 100 mcg (has no administration in time range)  sodium chloride 0.9 % bolus 1,000 mL (has no administration in time range)  LORazepam (ATIVAN) injection 1 mg (has no administration in time range)  sodium chloride 0.9 % bolus 1,000 mL (0 mLs Intravenous Stopped 09/02/21 1534)  LORazepam (ATIVAN) injection 1 mg (1 mg Intravenous Given 09/02/21 1500)    ED Course/ Medical Decision Making/ A&P                           Medical Decision  Making Amount and/or Complexity of Data Reviewed Labs: ordered.  Risk OTC drugs. Prescription drug management.   This patient presents to the ED for concern of si, anxiety, this involves an extensive number of treatment options, and is a complaint that carries with it a high risk of complications and morbidity.  The differential diagnosis includes electrolyte abn, psych d/o   Co morbidities that complicate the patient evaluation  hypothyroidism   Additional history obtained:  Additional history obtained from epic chart review    Lab Tests:  I Ordered, and personally interpreted labs.  The pertinent results include:  cbc nl, cmp nl, ua with large le, but neg nitrites and wbcs.  No urinary sx.  TSH pending   Cardiac Monitoring:  The patient was maintained on a cardiac monitor.  I personally viewed and interpreted the cardiac monitored which showed an underlying rhythm of: st   Medicines ordered and prescription drug management:  I ordered medication including ativan and ivfs  for anxiety  Reevaluation of the patient after these medicines showed that the patient improved I have reviewed the patients home medicines and have made adjustments as needed   Consultations Obtained:  I requested consultation with the psych,  and discussed lab and imaging findings as well as pertinent plan - TTS consult pending.  Pt is medically  clear   Problem List / ED Course:  Anxiety/tremors:  improved after fluids and IV ativan. SI:  TTS consult ordered   Reevaluation:  After the interventions noted above, I reevaluated the patient and found that they have :improved    Dispostion:  After consideration of the diagnostic results and the patients response to treatment, I feel that the patent would benefit from psych eval.          Final Clinical Impression(s) / ED Diagnoses Final diagnoses:  Suicidal ideation  Occasional tremors    Rx / DC Orders ED Discharge Orders     None         Isla Pence, MD 09/02/21 1720

## 2021-09-03 DIAGNOSIS — F332 Major depressive disorder, recurrent severe without psychotic features: Secondary | ICD-10-CM | POA: Diagnosis not present

## 2021-09-03 NOTE — ED Notes (Signed)
Pt asked for wash cloth, toothpaste and toothbrush. Tech/Sitter offered Pt a shower, Pt declined at this time. Items provided to Pt.

## 2021-09-03 NOTE — BH Assessment (Signed)
TTS attempted to assess pt @0335 . Per RN "pt is still sleeping, she had xanax earlier in the night I will reach out when pt is alert enough for assessment".

## 2021-09-03 NOTE — ED Provider Notes (Signed)
Emergency Medicine Observation Re-evaluation Note  Colleen [name] is a 71 y.o. female, seen on rounds today.  Pt initially presented to the ED for complaints of Suicidal Currently, the patient is upright watching television.  Physical Exam  BP (!) 151/96 (BP Location: Left Arm)    Pulse 88    Temp 98.6 F (37 C) (Oral)    Resp 20    SpO2 100%  Physical Exam General: No distress Cardiac: Regular rate and rhythm Lungs: No increased work of breathing Psych: Pleasantly interactive  ED Course / MDM  EKG:EKG Interpretation  Date/Time:  Wednesday September 02 2021 12:52:29 EST Ventricular Rate:  120 PR Interval:  158 QRS Duration: 88 QT Interval:  311 QTC Calculation: 438 R Axis:   57 Text Interpretation: Sinus tachycardia RSR' in V1 or V2, right VCD or RVH Borderline T abnormalities, anterior leads Since last tracing rate faster Confirmed by Jacalyn Lefevre [phone]) on 09/02/2021 5:18:54 PM  I have reviewed the labs performed to date as well as medications administered while in observation.  Recent changes in the last 24 hours include none.  Plan  Current plan is for psychiatry assessment, disposition.  Colleen [name] is not under involuntary commitment.     Gerhard Munch, MD 09/03/21 [phone]

## 2021-09-03 NOTE — BH Assessment (Addendum)
Comprehensive Clinical Assessment (CCA) Note  09/03/2021 Colleen [name] [id number]  Disposition: Per Darrol Angel, NP, patient is psychiatrically cleared and recommended for MH-IOP.   [place] ED from 09/02/2021 in [place] [facility]      The patient demonstrates the following risk factors for suicide: Chronic risk factors for suicide include: psychiatric disorder of depression and medical illness thyroid issues . Acute risk factors for suicide include: N/A. Protective factors for this patient include: positive social support, responsibility to others (children, family), and life satisfaction. Considering these factors, the overall suicide risk at this point appears to be high. Patient is not appropriate for outpatient follow up.   Colleen [name] is a 72 year old female presenting to [facility] voluntarily with chief complaint of suicidal ideations. Patient reports 10 days ago she had periods of worsening depression. Patient believes she has been feeling depressed due to her thyroid issues and her primary care manipulating her thyroid medications. Patient reports a week ago she went to her PCP to discuss her depression and she was started on Cymbalta $RemoveBef'20mg'KhdfEBsgox$  BID. Patient reports having an allergic reaction to the medications after taken them for four days. Patient reports waking up sweating, feeling nauseous and vomiting so she stopped taking the medications last Saturday. Patient reports she continued to feel depressed so she reached out to a therapist she was seeing in the past to see if she can receive services again. Patient reports she started having intrusive thoughts to harm herself and it scared her. Patient reports having thoughts of overdosing on her Xanax, so she decided to give them to her daughter. Patient reports she also went to stay with her daughter. 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 Patient reports while at her PCP appointment yesterday she had an episode of spasms. Patient reports it lasted about 4 hours and she made the statement I can't do this anymore and was transferred to the ED from her appointment.   Patient reports dx of depression and she was taking medications prescribed by her PCP. Patient does not have outpatient services currently, but she has reached out to a therapist she was seeing in the past. 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 Patient denies substance use, legal issues and she does not own a gun. Patient lives alone and she is retired.   Patient is oriented x5, engaged, alert and cooperative during assessment. Patient eye contact and speech is clear, patient affect is appropriate with congruent mood. Patient denies current SI however, she is unable to contact for safety; stating it would be very difficult for me to leave today because I will have to deal with my thoughts and not knowing what to do. I feel out of control. Patient denies prior suicide attempts. Patient denies HI, AVH and SIB. Patient feels she needs inpatient treatment.    Update: Patient informed RN that she did not want to do inpatient treatment but rather do IOP. TTS reassessed pt at NP request. Patient stated that she felt like her needs could be met with outpatient treatment and she did not want to leave her family. Patient denies SI and contracts for safety. Patient gives protective factors of her family, friends and "I have a lot of life left to live". Patient reports if thoughts return she will notify her daughter and seek treatment again.  Patient agrees to go live with her daughter for the next couple of days. Patient consents for TTS to contact her daughter [name] [phone]). Daughter denies having any safety concerns about patient discharging today and  reports that her mother is having hormonal issues which is causing her mental health symptoms. Daughter reports that patient can come live with her for the next few days and she will administer her medications as a added support. Daughter is aware of IOP referral.   TTS also discussed methods to reduce the risk of self-injury or suicide attempts: Frequent conversations regarding unsafe thoughts. Remove all significant sharps. Remove all firearms. Remove all medications, including over-the-counter meds. Consider lockbox for medications and having a responsible person dispense medications until patient has strengthened coping skills. Room checks for sharps or other harmful objects. Secure all chemical substances that can be ingested or inhaled.      Chief Complaint:  Chief Complaint  Patient presents with   Suicidal   Visit Diagnosis: MDD, severe, recurring, without psychotic features.     CCA Screening, Triage and Referral (STR)  Patient Reported Information How did you hear about [name]? No data recorded What Is the Reason for Your Visit/Call Today? No data recorded How Long Has This Been Causing You Problems? No data recorded What Do You Feel Would Help You the Most Today? No data recorded  Have You Recently Had Any Thoughts About Hurting Yourself? No data recorded Are You Planning to Commit Suicide/Harm Yourself At This time? No data recorded  Have you Recently Had Thoughts About Taliaferro? No data recorded Are You Planning to Harm Someone at This Time? No data recorded Explanation: No data recorded  Have You Used Any Alcohol or Drugs in the Past 24 Hours? No data recorded How Long Ago Did You Use Drugs or Alcohol? No data recorded What Did You Use and How Much? No data recorded  Do You Currently Have a Therapist/Psychiatrist? No data recorded Name of Therapist/Psychiatrist: No data recorded  Have You Been Recently Discharged From Any Office Practice or Programs? No data  recorded Explanation of Discharge From Practice/Program: No data recorded    CCA Screening Triage Referral Assessment Type of Contact: No data recorded Telemedicine Service Delivery:   Is this Initial or Reassessment? No data recorded Date Telepsych consult ordered in [facility]:  No data recorded Time Telepsych consult ordered in [facility]:  No data recorded Location of Assessment: No data recorded Provider Location: No data recorded  Collateral Involvement: No data recorded  Does Patient Have a [place]? No data recorded Name and Contact of Legal Guardian: No data recorded If Minor and Not Living with Parent(s), Who has Custody? No data recorded Is CPS involved or ever been involved? No data recorded Is APS involved or ever been involved? No data recorded  Patient Determined To Be At Risk for Harm To Self or Others Based on Review of Patient Reported Information or Presenting Complaint? No data recorded Method: No data recorded Availability of Means: No data recorded Intent: No data recorded Notification Required: No data recorded Additional Information for Danger to Others Potential: No data recorded Additional Comments for Danger to Others Potential: No data recorded Are There Guns or Other Weapons in Your Home? No data recorded Types of Guns/Weapons: No data recorded Are These Weapons Safely Secured?  No data recorded Who Could Verify You Are Able To Have These Secured: No data recorded Do You Have any Outstanding Charges, Pending Court Dates, Parole/Probation? No data recorded Contacted To Inform of Risk of Harm To Self or Others: No data recorded   Does Patient Present under Involuntary Commitment? No data recorded IVC Papers Initial File Date: No data recorded  [place] of Residence: No data recorded  Patient Currently Receiving the Following Services: No data recorded  Determination of Need: No data recorded  Options For Referral:  No data recorded    CCA Biopsychosocial Patient Reported Schizophrenia/Schizoaffective Diagnosis in Past: No data recorded  Strengths: No data recorded  Mental Health Symptoms Depression:  No data recorded  Duration of Depressive symptoms:    Mania:  No data recorded  Anxiety:   No data recorded  Psychosis:  No data recorded  Duration of Psychotic symptoms:    Trauma:  No data recorded  Obsessions:  No data recorded  Compulsions:  No data recorded  Inattention:  No data recorded  Hyperactivity/Impulsivity:  No data recorded  Oppositional/Defiant Behaviors:  No data recorded  Emotional Irregularity:  No data recorded  Other Mood/Personality Symptoms:  No data recorded   Mental Status Exam Appearance and self-care  Stature:  No data recorded  Weight:  No data recorded  Clothing:  No data recorded  Grooming:  No data recorded  Cosmetic use:  No data recorded  Posture/gait:  No data recorded  Motor activity:  No data recorded  Sensorium  Attention:  No data recorded  Concentration:  No data recorded  Orientation:  No data recorded  Recall/memory:  No data recorded  Affect and Mood  Affect:  No data recorded  Mood:  No data recorded  Relating  Eye contact:  No data recorded  Facial expression:  No data recorded  Attitude toward examiner:  No data recorded  Thought and Language  Speech flow: No data recorded  Thought content:  No data recorded  Preoccupation:  No data recorded  Hallucinations:  No data recorded  Organization:  No data recorded  [employer] of Knowledge:  No data recorded  Intelligence:  No data recorded  Abstraction:  No data recorded  Judgement:  No data recorded  Reality Testing:  No data recorded  Insight:  No data recorded  Decision Making:  No data recorded  Social Functioning  Social Maturity:  No data recorded  Social Judgement:  No data recorded  Stress  Stressors:  No data recorded  Coping Ability:  No data recorded   Skill Deficits:  No data recorded  Supports:  No data recorded    Religion:    Leisure/Recreation:    Exercise/Diet:     CCA Employment/Education Employment/Work Situation:    Education:     CCA Family/Childhood History Family and Relationship History:    Childhood History:     Child/Adolescent Assessment:     CCA Substance Use Alcohol/Drug Use:                           ASAM's:  Six Dimensions of Multidimensional Assessment  Dimension 1:  Acute Intoxication and/or Withdrawal Potential:      Dimension 2:  Biomedical Conditions and Complications:      Dimension 3:  Emotional, Behavioral, or Cognitive Conditions and Complications:     Dimension 4:  Readiness to Change:     Dimension 5:  Relapse, Continued use, or  Continued Problem Potential:     Dimension 6:  Recovery/Living Environment:     ASAM Severity Score:    ASAM Recommended Level of Treatment:     Substance use Disorder (SUD)    Recommendations for Services/Supports/Treatments:    Discharge Disposition:    DSM5 Diagnoses: Patient Active Problem List   Diagnosis Date Noted   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

## 2021-09-03 NOTE — BH Assessment (Signed)
BHH Assessment Progress Note   Per Liborio Nixon, NP, this pt does not require psychiatric hospitalization at this time.  Pt is psychiatrically cleared.  Pt would benefit from the Mental Health Intensive Outpatient Program at the [facility], which is currently being offered virtually due to Covid-19.  I have contacted Jeri Modena, MEd, who has scheduled pt to start the program on Monday, 09/07/2021  at 09:00 This has been included in pt's discharge instructions.  Pt's nurse, Magda Paganini, has been notified.  Doylene Canning, MA Triage Specialist [phone]

## 2021-09-03 NOTE — BH Assessment (Addendum)
Updated disposition: Per Darrol Angel, NP, patient is psychiatrically cleared and recommended for MH-IOP.   Disposition: Per Darrol Angel, NP, pt recommended for geropsychiatric inpt treatment.

## 2021-09-03 NOTE — ED Provider Notes (Signed)
Emergency Medicine Observation Re-evaluation Note  Colleen [name] is a 71 y.o. female, seen on rounds today.  Pt initially presented to the ED for complaints of Suicidal Currently, the patient is feeling improved and cleared by psychiatry for discharge with plans for intensive outpatient therapy.  Physical Exam  BP (!) 140/94 (BP Location: Right Arm)    Pulse 80    Temp 98.5 F (36.9 C) (Oral)    Resp 20    SpO2 98%  Physical Exam General: Well-developed well-nourished female seen the bed is not appear to be in acute distress Cardiac: Heart rate recorded is 80 Lungs: Patient without any respiratory distress with respiratory rate approximately 14 and oxygen saturation saturations recorded is 98% Psych: Patient feels that plan is appropriate and in place and is in agreement with discharge for intensive outpatient therapy  ED Course / MDM  EKG:EKG Interpretation  Date/Time:  Wednesday September 02 2021 12:52:29 EST Ventricular Rate:  120 PR Interval:  158 QRS Duration: 88 QT Interval:  311 QTC Calculation: 438 R Axis:   57 Text Interpretation: Sinus tachycardia RSR' in V1 or V2, right VCD or RVH Borderline T abnormalities, anterior leads Since last tracing rate faster Confirmed by Jacalyn Lefevre [phone]) on 09/02/2021 5:18:54 PM  I have reviewed the labs performed to date as well as medications administered while in observation.  Recent changes in the last 24 hours include behavioral health consultation and plan for intensive outpatient therapy.  Plan  Current plan is for discharge to home.  Colleen [name] is not under involuntary commitment.     Margarita Grizzle, MD 09/03/21 [phone]

## 2021-09-03 NOTE — Progress Notes (Signed)
Patient has been denied by [facility] due to no appropriate beds available. Patient meets BH inpatient criteria per Liborio Nixon, NP. Patient has been faxed out to the following facilities:   [facility]  [address], [place] [address] [phone]  [facility] [address]  [address] [place] [address] [phone]  [facility]  [address] [place] [address] [phone]  [facility]  [address] [place] [address] [phone]  [facility]  [address] [place] [address] [phone]  [facility]  [address] [place] [address] [phone]  [facility]  [address] [place], [place] [address] [phone]  [facility]  [address], [place] [address] [phone]  [facility]  [address] [place] [address] [phone]  [facility]  [address] [place] [address] [phone]  [facility]  [address] [place] [address] [phone]  [facility]  [address], [place] [address] [phone]  [facility]  [address] [place], [place] [address] [phone]  [facility]  [address] [place] [address] [phone]  [facility]  [address], [place] [address] [phone]  [facility]  [address], [place] [address] [phone]   Damita Dunnings, MSW, LCSW-A  9:46 AM 09/03/2021

## 2021-09-03 NOTE — ED Notes (Signed)
NT for [facility] walked pt to her car locate at [place]

## 2021-09-03 NOTE — Discharge Instructions (Addendum)
For your behavioral health needs, you are advised to follow up with the Mental Health Intensive Outpatient Program (MH-IOP) at the [facility] at [place].  This program meets Monday - Friday from 9:00 am - 12:00 pm.  Due to Covid-19 this program is currently virtual.  You are scheduled to start the program on Monday, September 07, 2021 at 9:00 am.  If you have any questions, contact Dellia Nims, MEd at the phone number indicated below:       [facility]. [employer]. [place], [place] [address]      Contact person: Dellia Nims, MEd      [phone]

## 2021-09-04 ENCOUNTER — Telehealth (HOSPITAL_COMMUNITY): Payer: Self-pay | Admitting: Psychiatry

## 2021-09-04 NOTE — Telephone Encounter (Signed)
D:  Pt was referred to [place] per Sharl Ma, NP @ [facility].  A:  Placed call to orient pt but there was no answer.  Left vm for pt to call the case manager back before 1 pm today.

## 2021-09-07 ENCOUNTER — Telehealth (HOSPITAL_COMMUNITY): Payer: Self-pay | Admitting: Psychiatry

## 2021-09-07 ENCOUNTER — Ambulatory Visit (INDEPENDENT_AMBULATORY_CARE_PROVIDER_SITE_OTHER)
Admission: EM | Admit: 2021-09-07 | Discharge: 2021-09-07 | Disposition: A | Discharge status: Home / Self Care | Payer: Medicare Other | Source: Home / Self Care

## 2021-09-07 ENCOUNTER — Encounter (HOSPITAL_COMMUNITY): Payer: Self-pay | Admitting: Psychiatry

## 2021-09-07 ENCOUNTER — Other Ambulatory Visit (HOSPITAL_COMMUNITY): Payer: Medicare Other | Attending: Psychiatry | Admitting: Psychiatry

## 2021-09-07 ENCOUNTER — Other Ambulatory Visit: Payer: Self-pay

## 2021-09-07 DIAGNOSIS — F419 Anxiety disorder, unspecified: Secondary | ICD-10-CM | POA: Insufficient documentation

## 2021-09-07 DIAGNOSIS — F332 Major depressive disorder, recurrent severe without psychotic features: Secondary | ICD-10-CM

## 2021-09-07 DIAGNOSIS — R45851 Suicidal ideations: Secondary | ICD-10-CM | POA: Insufficient documentation

## 2021-09-07 DIAGNOSIS — F418 Other specified anxiety disorders: Secondary | ICD-10-CM

## 2021-09-07 DIAGNOSIS — F329 Major depressive disorder, single episode, unspecified: Secondary | ICD-10-CM | POA: Insufficient documentation

## 2021-09-07 DIAGNOSIS — F322 Major depressive disorder, single episode, severe without psychotic features: Secondary | ICD-10-CM

## 2021-09-07 DIAGNOSIS — Z79899 Other long term (current) drug therapy: Secondary | ICD-10-CM | POA: Insufficient documentation

## 2021-09-07 MED ORDER — ESCITALOPRAM OXALATE 10 MG PO TABS: 5.0000 mg | ORAL_TABLET | Freq: Every day | ORAL | 0 refills | Status: DC

## 2021-09-07 NOTE — Progress Notes (Signed)
Virtual Visit via Video Note   I connected with Mayo Ao on 09/07/21 at  9:00 AM EDT by a video enabled telemedicine application and verified that I am speaking with the correct person using two identifiers.   At orientation to the IOP program, Case Manager discussed the limitations of evaluation and management by telemedicine and the availability of in person appointments. The patient expressed understanding and agreed to proceed with virtual visits throughout the duration of the program.   Location:  Patient: Patient Home Provider: OPT BH Office   History of Present Illness: MDD with Anxiety    Observations/Objective: Check In: Case Manager checked in with all participants to review discharge dates, insurance authorizations, work-related documents and needs from the treatment team regarding medications. Alorah stated needs and engaged in discussion.    Initial Therapeutic Activity: Counselor facilitated a check-in with Genelda to assess for safety, sobriety and medication compliance.  Counselor also inquired about Shaylon's current emotional ratings, as well as any significant changes in thoughts, feelings or behavior since previous check in.  Jezelle presented for session on time and was alert, oriented x5, with no evidence or self-report of active SI/HI or A/V H.  Desire reported compliance with medication and denied use of alcohol or illicit substances.  Jesly reported scores of 10/10 for depression, 10/10 for anxiety, and 5/10 for anger/irritability.  Sindee denied any recent outbursts or panic attacks.  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  She reported that her goal is to meet with the NP this week to discuss medications which could help with current symptoms.  Everlynn reported that she is motivated to give MHIOP a chance to  see if it helps, stating I cant do this on my own anymore.         Second Therapeutic Activity: Counselor engaged the group in discussion on managing work/life balance today to improve mental health and wellness.  Counselor explained how finding balance between responsibilities at home and work place can be challenging, lead to increased stress, and this has been further complicated by recent pandemic leading to unemployment, more virtual work, and blurring of lines between home as a place of rest or work duties.  Counselor facilitated discussion on what challenges members have faced with this issue historically, as well as what, if any, issues have arisen following pandemic. Counselor also discussed strategies for improving work/life balance while members work on their mental health during treatment.  Some of these included keeping track of time management; creating a list of priorities and scaling importance; setting realistic, measurable goals each day; establishing boundaries; taking care of health needs; and nurturing relationships at home and work for support.  Counselor inquired about areas where members feel they are excelling, as well as areas they could focus on during treatment.  Intervention effectiveness was mixed, as evidenced by Thurston Hole showing some participation in discussion with counselor and peers on subject, but noting that because she is retired, she didn't feel like the subject was beneficial to her needs.  Adelai reported that she has experienced burnout in previous employment, including feeling tired and drained during and after shifts, as well as experiencing frequent headaches.  Adaliz reported that some strategies offered could prove helpful in establishing healthier work life balance with household tasks, including breaking up goals into smaller, more  concrete tasks, and eating smaller meals throughout the day to ensure she has adequate energy.    Assessment and Plan: Counselor recommends  that Ivey remain in IOP treatment to better manage mental health symptoms, ensure stability and pursue completion of treatment plan goals. Counselor recommends adherence to crisis/safety plan, taking medications as prescribed, and following up with medical professionals if any issues arise.   Follow Up Instructions: Counselor will send Webex link for next session. Margree was advised to call back or seek an in-person evaluation if the symptoms worsen or if the condition fails to improve as anticipated.   Collaboration of Care:   Medication Management AEB Hillery Jacks, NP                                           Case Manager AEB Jeri Modena, CNA   Patient/Guardian was advised Release of Information must be obtained prior to any record release in order to collaborate their care with an outside provider. Patient/Guardian was advised if they have not already done so to contact the registration department to sign all necessary forms in order for [name] to release information regarding their care.   Consent: Patient/Guardian gives verbal consent for treatment and assignment of benefits for services provided during this visit. Patient/Guardian expressed understanding and agreed to proceed.  I provided 180 minutes of non-face-to-face time during this encounter.   Noralee Stain, LCSW, LCAS 09/07/21

## 2021-09-07 NOTE — BH Assessment (Signed)
Pt reports worsening depression and anxiety and is hopefull that a provider can give her medications for symptoms. Pt denies SI, HI, AVH. TTS also contacted pt daughter to notify her that pt was seeking treatment today at pt request.

## 2021-09-07 NOTE — ED Provider Notes (Signed)
Behavioral [facility]gent [facility] Medical Screening Exam  Patient Name: Colleen [name] MRN: [id number] Date of Evaluation: 09/07/21 Chief Complaint:   Diagnosis:  Final diagnoses:  Severe episode of recurrent major depressive disorder, without psychotic features ([place])    History of Present illness: Colleen [name] is a 71 y.o. female patient presented to [facility] as a walk in alone with complaints of increased depression, anxiety, and passive SI.  Colleen [name], 71 y.o., female patient seen face to face by this provider, consulted with [name]; and chart reviewed on 09/07/21.  Per chart review patient was seen at [facility] ED on 09/02/2021 related to suicidal ideations.  She was recommended for [facility] psychiatric admission..  No bed was available and patient was cleared in the ED.  She was able to safety plan and return home.  She was referred to the PHP/IOP program.  Patient attended her first session today.  States the group stuff did not pertain to her.  She requested to be discharged from the program.  She was given resources for [facility] and presents for an assessment.  Patient's medications are managed by her PCP [name].  She is prescribed levothyroxine, Xanax 0.5 twice daily and Cymbalta 60 mg daily.  States she no longer has a thyroid.  Cymbalta has been ineffective in treating her depression and anxiety.  Cymbalta was prescribed due to patient's depression and neurological functional disorder.  States since she started Cymbalta she has had suicidal ideations.  Her PCP is weaning her off of the medication she has 1 dose of Cymbalta 20 mg left to take.  She is concerned because her primary care has not started any medication for depression.  Patient states she has been on multiple antidepressants throughout the years.  States the only antidepressant that she could tolerate was Lexapro.  She denies any substance use.  [name] is observed walking to the assessment room.  She  has a steady gait and uses no assistive devices.  She is assessed while sitting in her chair.  She is alert/oriented x4 and cooperative.  She is fairly groomed and makes good eye contact.  She is visibly anxious.  Reports her anxiety has become so intense that she is now taking her Xanax 0.5 mg 4 times a day.  Encouraged patient to discuss medications with her provider.  Educated that Valium is longer acting and to discuss possible switching from Ativan to Valium at her next medication management appointment.  She endorses depression with feelings of helplessness, decreased focus, decreased energy and irritability.  Reports all the symptoms intensified when she began taking Cymbalta.  In addition she began to have suicidal ideations.  She endorses SI at this time.  However she denies any intent, plan, or access to means.  She is currently living with her daughter as a safety measure.  She contracts for safety.  Denies access to firearms/weapons.  Objectively she does not appear to be responding to internal/external stimuli.  She denies AVH and HI.  Educated patient she has an outpatient appointment with Dr. De Nurse t on 09/11/2021 at 9 AM for medication management.  She has an appointment for therapy with her individual therapist [name] this Friday.  Discussed restarting Lexapro 5 mg daily and patient agrees.  She will be provided with a 14-day prescription.  Educated on medication and side effects.  At this time [name] is educated and verbalizes understanding of mental health resources and other crisis services  in the community.  She is instructed to call 911 and present to the nearest emergency room should she experience any suicidal/homicidal ideation, auditory/visual/hallucinations, or detrimental worsening of her mental health condition.  She was a also advised by Clinical research associatewriter that she could call the toll-free phone on insurance card to assist with identifying in network counselors and agencies  or number on back of Medicaid card t speak with care coordinator    Psychiatric Specialty Exam  Presentation  General Appearance:Appropriate for Environment; Casual  Eye Contact:Good  Speech:Clear and Coherent; Normal Rate  Speech Volume:Normal  Handedness:Right   Mood and Affect  Mood:Anxious; Depressed  Affect:Congruent   Thought Process  Thought Processes:Coherent  Descriptions of Associations:Intact  Orientation:Full (Time, Place and Person)  Thought Content:Logical    Hallucinations:None  Ideas of Reference:None  Suicidal Thoughts:Yes, Passive Without Intent; Without Plan; Without Access to Means  Homicidal Thoughts:No   Sensorium  Memory:Immediate Good; Recent Good; Remote Good  Judgment:Good  Insight:Good   Executive Functions  Concentration:Good  Attention Span:Good  Recall:Good  Fund of Knowledge:Good  Language:Good   Psychomotor Activity  Psychomotor Activity:Normal   Assets  Assets:Communication Skills; Desire for Improvement; Financial Resources/Insurance; Housing; Leisure Time; Physical Health; Resilience; Social Support   Sleep  Sleep:Good  Number of hours: 8   Nutritional Assessment (For OBS and FBC admissions only) Has the patient had a weight loss or gain of 10 pounds or more in the last 3 months?: No Has the patient had a decrease in food intake/or appetite?: Yes Does the patient have dental problems?: No Does the patient have eating habits or behaviors that may be indicators of an eating disorder including binging or inducing vomiting?: No Has the patient recently lost weight without trying?: 0 Has the patient been eating poorly because of a decreased appetite?: 1 Malnutrition Screening Tool Score: 1    Physical Exam: Physical Exam Vitals and nursing note reviewed.  Constitutional:      General: She is not in acute distress.    Appearance: Normal appearance. She is not ill-appearing.  HENT:     Head:  Normocephalic.  Eyes:     General:        Right eye: No discharge.        Left eye: No discharge.     Conjunctiva/sclera: Conjunctivae normal.  Cardiovascular:     Rate and Rhythm: Normal rate.  Pulmonary:     Effort: Pulmonary effort is normal. No respiratory distress.  Musculoskeletal:        General: Normal range of motion.     Cervical back: Normal range of motion.  Skin:    General: Skin is dry.     Coloration: Skin is not jaundiced or pale.  Neurological:     Mental Status: She is alert and oriented to person, place, and time.  Psychiatric:        Attention and Perception: Attention normal.        Mood and Affect: Affect normal. Mood is anxious and depressed.        Speech: Speech normal.        Behavior: Behavior is cooperative.        Thought Content: Thought content includes suicidal ideation. Thought content does not include suicidal plan.        Cognition and Memory: Cognition normal.        Judgment: Judgment normal.   Review of Systems  Constitutional: Negative.   HENT: Negative.    Eyes: Negative.   Respiratory: Negative.  Cardiovascular: Negative.   Musculoskeletal: Negative.   Skin: Negative.   Neurological: Negative.   Endo/Heme/Allergies: Negative.   Psychiatric/Behavioral:  Positive for depression and suicidal ideas. The patient is nervous/anxious.   Blood pressure 136/86, pulse (!) 106, temperature 98.2 F (36.8 C), temperature source Oral, resp. rate 18, height 5\' 4"  (1.626 m), weight 148 lb (67.1 kg), SpO2 99 %. Body mass index is 25.4 kg/m.  Musculoskeletal: Strength & Muscle Tone: within normal limits Gait & Station: normal Patient leans: N/A   [place] MSE Discharge Disposition for Follow up and Recommendations: Based on my evaluation the patient does not appear to have an emergency medical condition and can be discharged with resources and follow up care in outpatient services for Medication Management and Individual Therapy  Dr. De Nurse  ([facility]) 09-12-21 @ 9 a.m Therapist appt with Mosetta Anis 09/11/2021  Lexapro 5 mg QD PO for 7 days and follow up with Dr. De Nurse.   No evidence of imminent risk to self or others at present.    Patient does not meet criteria for psychiatric inpatient admission. Discussed crisis plan, support from social network, calling 911, coming to the Emergency Department, and calling Suicide Hotline.    Revonda Humphrey, NP 09/07/2021, 5:30 PM

## 2021-09-07 NOTE — Discharge Summary (Signed)
Colleen [name] to be D/C'd Home per NP order. An After Visit Summary was printed and given to the patient by provider. Patient escorted out and D/C home via private auto.  Dickie La  09/07/2021 5:17 PM

## 2021-09-07 NOTE — Telephone Encounter (Signed)
D:  Patient called c/o groups.  "The last group didn't even pertain to me and I didn't want to stay."  Pt states she really doesn't want group at all.  Reports she just would like to see someone about her medications.  "I feel like I am crawling out of my skin.  I have anxiety and depression real bad."  According to Ernestene Kiel, CMA; pt has called the nurse line twice today asking to speak to Hillery Jacks, NP. C/O anxiety and depression; Cymbalta making her depression worse and needing something for her anxiety. A:  Informed Hillery Jacks, NP.  Also, got pt scheduled with [name] ([facility]) 09-12-21 @ 9 a.m; since pt doesn't want to continue in MH-IOP.  Pt states that her therapist changed her appt from 09-11-21, b/c she couldn't see pt this Friday.  Case mgr doesn't know when it was changed to. Strongly recommended pt to go to [facility] as a walk in for an assessment if she continues to get worse.  Provided pt with [facility]'s address.

## 2021-09-07 NOTE — Progress Notes (Signed)
Virtual Visit via Video Note  I connected with Colleen [name] on _0 @ at  9:00 AM EST by a video enabled telemedicine application and verified that I am speaking with the correct person using two identifiers.  Location: Patient: at home Provider: at office   I discussed the limitations of evaluation and management by telemedicine and the availability of in person appointments. The patient expressed understanding and agreed to proceed.  I discussed the assessment and treatment plan with the patient. The patient was provided an opportunity to ask questions and all were answered. The patient agreed with the plan and demonstrated an understanding of the instructions.   The patient was advised to call back or seek an in-person evaluation if the symptoms worsen or if the condition fails to improve as anticipated.  I provided 30 minutes of non-face-to-face time during this encounter.   Colleen [name], M.Ed,CNA   Patient ID: Colleen [name], female   DOB: [birth year], 71 y.o.   MRN: [id number] As per previous CCA states:  "Colleen [name] is a 71 year old female presenting to [facility] voluntarily with chief complaint of suicidal ideations. Patient reports 10 days ago she had periods of worsening depression. Patient believes she has been feeling depressed due to her thyroid issues and her primary care manipulating her thyroid medications. Patient reports a week ago she went to her PCP to discuss her depression and she was started on Cymbalta 58m BID. Patient reports having an allergic reaction to the medications after taken them for four days. Patient reports waking up sweating, feeling nauseous and vomiting so she stopped taking the medications last Saturday. Patient reports she continued to feel depressed so she reached out to a therapist she was seeing in the past to see if she can receive services again. Patient reports she started having intrusive thoughts to harm herself and it scared her.  Patient reports having thoughts of overdosing on her Xanax, so she decided to give them to her daughter. Patient reports she also went to stay with her daughter. 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 Patient reports while at her PCP appointment yesterday she had an episode of spasms. Patient reports it lasted about 4 hours and she made the statement I can't do this anymore and was transferred to the ED from her appointment.    Patient reports dx of depression and she was taking medications prescribed by her PCP. Patient does not have outpatient services currently, but she has reached out to a therapist she was seeing in the past. 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 Patient denies substance use, legal issues and she does not own a gun. Patient lives alone and she is retired.    Patient is oriented x5, engaged, alert and cooperative during assessment. Patient eye contact and speech is clear, patient affect is appropriate with congruent mood. Patient denies current SI however, she is unable to contact for safety; stating it would be very difficult for me to leave today because I will have to deal with my thoughts and not knowing what to do. I feel out of control. Patient denies prior suicide attempts. Patient denies HI, AVH and SIB. Patient feels she needs inpatient treatment.      Update: Patient informed RN that she did not want to do inpatient treatment but rather do IOP. TTS reassessed pt at NP request. Patient stated that she  felt like her needs could be met with outpatient treatment and she did not want to leave her family. Patient denies SI and contracts for safety. Patient gives protective factors of her family, friends and "I have a lot of life left to live". Patient reports if thoughts return she will notify her daughter and seek treatment  again. Patient agrees to go live with her daughter for the next couple of days. Patient consents for TTS to contact her daughter Colleen [name] [phone]). Daughter denies having any safety concerns about patient discharging today and reports that her mother is having hormonal issues which is causing her mental health symptoms. Daughter reports that patient can come live with her for the next few days and she will administer her medications as a added support. Daughter is aware of IOP referral.    TTS also discussed methods to reduce the risk of self-injury or suicide attempts: Frequent conversations regarding unsafe thoughts. Remove all significant sharps. Remove all firearms. Remove all medications, including over-the-counter meds. Consider lockbox for medications and having a responsible person dispense medications until patient has strengthened coping skills. Room checks for sharps or other harmful objects. Secure all chemical substances that can be ingested or inhaled."  CC: telephone note from today. Pt started MH-IOP today; although she states she doesn't want to attend at all.  She was agitated through out group today.  Stating that she didn't want to be there, but needed to talk to someone re: her medications.  Pt denies SI/HI or AV hallucinations.  "I'm just depressed and anxious.  I feel like I am about to crawl out of my skin.  Please just let me see someone about my medications."  A:  Oriented pt.  Provided pt with support.  Scheduled [facility] for [facility] ([facility]21 @ 9a.m) with Colleen [name].  Requested that Colleen Ala, NP talk to pt.  Pt was advised of ROI must be obtained prior to any record release in order to collaborate car with an outside provider.  Pt was advised if she has not already done so to contact the registration dept to sign all necessary forms in order for MH-IOP to release info re: her care.  Pt gives verbal consent for tx and assignment of benefits for services provided  during this telehealth visit.  Pt expressed understanding and agreed to proceed. Pt will improve her mood as evidenced by being happy again, managing her mood and coping with daily stressors for 5 out of 7 days for 60 days. Pt will f/u with a psychiatrist and therapist.  R:  Pt receptive.  Colleen [name], M.Ed,CNA

## 2021-09-07 NOTE — Discharge Instructions (Signed)
Patient is instructed prior to discharge to: Take all medications as prescribed by his/her mental healthcare provider. Report any adverse effects and or reactions from the medicines to his/her outpatient provider promptly. Patient has been instructed & cautioned: To not engage in alcohol and or illegal drug use while on prescription medicines. In the event of worsening symptoms, patient is instructed to call the crisis hotline, 911 and or go to the nearest ED for appropriate evaluation and treatment of symptoms. To follow-up with his/her primary care provider for your other medical issues, concerns and or health care needs.  The suicide prevention education provided includes the following: Suicide risk factors Suicide prevention and interventions National Suicide Hotline telephone number [facility] assessment telephone number [place] Emergency Assistance 911 County and/or Residential Mobile Crisis Unit telephone number   Request made of family/significant other to: Remove weapons (e.g., guns, rifles, knives), all items previously/currently identified as safety concern.   Remove drugs/medications (over the counter, prescriptions, illicit drugs), all items previously/currently identified as a safety concern.  

## 2021-09-08 ENCOUNTER — Ambulatory Visit (HOSPITAL_COMMUNITY): Payer: Self-pay | Admitting: Psychiatry

## 2021-09-08 ENCOUNTER — Telehealth (HOSPITAL_COMMUNITY): Payer: Self-pay | Admitting: Psychiatry

## 2021-09-08 NOTE — Telephone Encounter (Signed)
D: Pt had called and left vm stating she wanted the case manager to call her back re: group.  A:  Placed call to pt.  Pt reports she received the group link yesterday.  "I am confused because I got the link, but I thought the program was voluntary?"  Reiterated to pt that the group is voluntary and the group leader had sent it before he found out that pt went to [facility] and wouldn't be returning.  Pt confirmed that she wouldn't be returning to group.  Plans to see Dr. De Nurse as scheduled on 09-12-21 @ 9 a.m..  Encouraged pt to come in to complete all necessary paperwork before [facility].  Also, strongly recommended that pt f/u with her therapist.  R:  Pt receptive.

## 2021-09-09 ENCOUNTER — Ambulatory Visit (INDEPENDENT_AMBULATORY_CARE_PROVIDER_SITE_OTHER)
Admission: EM | Admit: 2021-09-09 | Discharge: 2021-09-09 | Disposition: A | Discharge status: Other Acute Inpatient Hospital | Payer: Medicare Other | Source: Home / Self Care

## 2021-09-09 ENCOUNTER — Ambulatory Visit (HOSPITAL_COMMUNITY): Payer: Self-pay

## 2021-09-09 ENCOUNTER — Emergency Department (HOSPITAL_COMMUNITY)
Admission: EM | Admit: 2021-09-09 | Discharge: 2021-09-10 | Disposition: A | Discharge status: Other Acute Inpatient Hospital | Payer: Medicare Other | Attending: Emergency Medicine | Admitting: Emergency Medicine

## 2021-09-09 ENCOUNTER — Other Ambulatory Visit: Payer: Self-pay

## 2021-09-09 DIAGNOSIS — Z20822 Contact with and (suspected) exposure to covid-19: Secondary | ICD-10-CM | POA: Insufficient documentation

## 2021-09-09 DIAGNOSIS — Z79899 Other long term (current) drug therapy: Secondary | ICD-10-CM | POA: Insufficient documentation

## 2021-09-09 DIAGNOSIS — R2990 Unspecified symptoms and signs involving the nervous system: Secondary | ICD-10-CM | POA: Insufficient documentation

## 2021-09-09 DIAGNOSIS — F32A Depression, unspecified: Secondary | ICD-10-CM | POA: Diagnosis present

## 2021-09-09 DIAGNOSIS — R450 Nervousness: Secondary | ICD-10-CM | POA: Insufficient documentation

## 2021-09-09 DIAGNOSIS — F332 Major depressive disorder, recurrent severe without psychotic features: Secondary | ICD-10-CM | POA: Insufficient documentation

## 2021-09-09 DIAGNOSIS — R45851 Suicidal ideations: Secondary | ICD-10-CM | POA: Insufficient documentation

## 2021-09-09 DIAGNOSIS — F419 Anxiety disorder, unspecified: Secondary | ICD-10-CM | POA: Insufficient documentation

## 2021-09-09 LAB — COMPREHENSIVE METABOLIC PANEL
ALT: 23 U/L (ref 0–44)
AST: 20 U/L (ref 15–41)
Albumin: 4.3 g/dL (ref 3.5–5.0)
Alkaline Phosphatase: 59 U/L (ref 38–126)
Anion gap: 9 (ref 5–15)
BUN: 8 mg/dL (ref 8–23)
CO2: 25 mmol/L (ref 22–32)
Calcium: 9 mg/dL (ref 8.9–10.3)
Chloride: 98 mmol/L (ref 98–111)
Creatinine, Ser: 0.87 mg/dL (ref 0.44–1.00)
GFR, Estimated: >60 mL/min (ref >60)
Glucose, Bld: 135 mg/dL — ABNORMAL HIGH (ref 70–99)
Potassium: 4.2 mmol/L (ref 3.5–5.1)
Sodium: 132 mmol/L — ABNORMAL LOW (ref 135–145)
Total Bilirubin: 0.7 mg/dL (ref 0.3–1.2)
Total Protein: 7 g/dL (ref 6.5–8.1)

## 2021-09-09 LAB — POCT URINE DRUG SCREEN - MANUAL ENTRY (I-SCREEN)
POC Amphetamine UR: NOT DETECTED
POC Buprenorphine (BUP): NOT DETECTED
POC Cocaine UR: NOT DETECTED
POC Marijuana UR: NOT DETECTED
POC Methadone UR: NOT DETECTED
POC Methamphetamine UR: NOT DETECTED
POC Morphine: NOT DETECTED
POC Oxazepam (BZO): POSITIVE — AB
POC Oxycodone UR: NOT DETECTED
POC Secobarbital (BAR): NOT DETECTED

## 2021-09-09 LAB — MAGNESIUM: Magnesium: 2.1 mg/dL (ref 1.7–2.4)

## 2021-09-09 LAB — LIPID PANEL
Cholesterol: 165 mg/dL (ref 0–200)
HDL: 71 mg/dL (ref >40)
LDL Cholesterol: 67 mg/dL (ref 0–99)
Total CHOL/HDL Ratio: 2.3 RATIO
Triglycerides: 136 mg/dL (ref <150)
VLDL: 27 mg/dL (ref 0–40)

## 2021-09-09 LAB — URINALYSIS, COMPLETE (UACMP) WITH MICROSCOPIC
Bilirubin Urine: NEGATIVE
Glucose, UA: NEGATIVE mg/dL
Ketones, ur: NEGATIVE mg/dL
Nitrite: NEGATIVE
Protein, ur: NEGATIVE mg/dL
Specific Gravity, Urine: 1.011 (ref 1.005–1.030)
WBC, UA: >50 WBC/hpf — ABNORMAL HIGH (ref 0–5)
pH: 5 (ref 5.0–8.0)

## 2021-09-09 LAB — RESP PANEL BY RT-PCR (FLU A&B, COVID) ARPGX2
Influenza A by PCR: NEGATIVE
Influenza B by PCR: NEGATIVE
SARS Coronavirus 2 by RT PCR: NEGATIVE

## 2021-09-09 LAB — CBC WITH DIFFERENTIAL/PLATELET
Abs Immature Granulocytes: 0.02 10*3/uL (ref 0.00–0.07)
Basophils Absolute: 0 10*3/uL (ref 0.0–0.1)
Basophils Relative: 1 %
Eosinophils Absolute: 0.1 10*3/uL (ref 0.0–0.5)
Eosinophils Relative: 2 %
HCT: 37.9 % (ref 36.0–46.0)
Hemoglobin: 13.8 g/dL (ref 12.0–15.0)
Immature Granulocytes: 0 %
Lymphocytes Relative: 28 %
Lymphs Abs: 1.3 10*3/uL (ref 0.7–4.0)
MCH: 33.8 pg (ref 26.0–34.0)
MCHC: 36.4 g/dL — ABNORMAL HIGH (ref 30.0–36.0)
MCV: 92.9 fL (ref 80.0–100.0)
Monocytes Absolute: 0.4 10*3/uL (ref 0.1–1.0)
Monocytes Relative: 9 %
Neutro Abs: 2.9 10*3/uL (ref 1.7–7.7)
Neutrophils Relative %: 60 %
Platelets: 289 10*3/uL (ref 150–400)
RBC: 4.08 MIL/uL (ref 3.87–5.11)
RDW: 11.8 % (ref 11.5–15.5)
WBC: 4.8 10*3/uL (ref 4.0–10.5)
nRBC: 0 % (ref 0.0–0.2)

## 2021-09-09 LAB — HEMOGLOBIN A1C
Hgb A1c MFr Bld: 4.8 % (ref 4.8–5.6)
Mean Plasma Glucose: 91.06 mg/dL

## 2021-09-09 LAB — POC SARS CORONAVIRUS 2 AG -  ED: SARS Coronavirus 2 Ag: NEGATIVE

## 2021-09-09 LAB — ETHANOL: Alcohol, Ethyl (B): <10 mg/dL (ref <10)

## 2021-09-09 MED ORDER — ALPRAZOLAM 0.5 MG PO TABS
0.5000 mg | ORAL_TABLET | Freq: Two times a day (BID) | ORAL | Status: DC
Start: 2021-09-09 — End: 2021-09-10
  Administered 2021-09-09 – 2021-09-10 (×2): 0.5 mg via ORAL
  Filled 2021-09-09 (×2): qty 1

## 2021-09-09 MED ORDER — LORAZEPAM 0.5 MG PO TABS
0.5000 mg | ORAL_TABLET | Freq: Once | ORAL | Status: AC
  Administered 2021-09-09: 0.5 mg via ORAL
  Filled 2021-09-09: qty 1

## 2021-09-09 MED ORDER — LEVOTHYROXINE SODIUM 100 MCG PO TABS: 100.0000 ug | ORAL_TABLET | ORAL | Status: DC

## 2021-09-09 MED ORDER — ALUM & MAG HYDROXIDE-SIMETH 200-200-20 MG/5ML PO SUSP: 30.0000 mL | ORAL | Status: DC | PRN

## 2021-09-09 MED ORDER — ACETAMINOPHEN 325 MG PO TABS: 650.0000 mg | ORAL_TABLET | Freq: Four times a day (QID) | ORAL | Status: DC | PRN

## 2021-09-09 MED ORDER — ESCITALOPRAM OXALATE 10 MG PO TABS
5.0000 mg | ORAL_TABLET | Freq: Every day | ORAL | Status: DC
  Administered 2021-09-09 – 2021-09-10 (×2): 5 mg via ORAL
  Filled 2021-09-09 (×2): qty 1

## 2021-09-09 MED ORDER — ALPRAZOLAM 1 MG PO TABS
1.0000 mg | ORAL_TABLET | Freq: Every evening | ORAL | Status: DC | PRN
  Administered 2021-09-09: 1 mg via ORAL
  Filled 2021-09-09: qty 1

## 2021-09-09 MED ORDER — CEPHALEXIN 500 MG PO CAPS
500.0000 mg | ORAL_CAPSULE | Freq: Four times a day (QID) | ORAL | Status: DC
Start: 2021-09-09 — End: 2021-09-10
  Administered 2021-09-09 – 2021-09-10 (×2): 500 mg via ORAL
  Filled 2021-09-09 (×2): qty 1

## 2021-09-09 MED ORDER — LOSARTAN POTASSIUM 50 MG PO TABS
50.0000 mg | ORAL_TABLET | Freq: Every day | ORAL | Status: DC
Start: 2021-09-10 — End: 2021-09-10
  Administered 2021-09-10: 50 mg via ORAL
  Filled 2021-09-09 (×2): qty 1

## 2021-09-09 MED ORDER — ALPRAZOLAM 0.5 MG PO TABS
0.5000 mg | ORAL_TABLET | Freq: Two times a day (BID) | ORAL | Status: DC
  Administered 2021-09-09: 0.5 mg via ORAL
  Filled 2021-09-09: qty 1

## 2021-09-09 MED ORDER — MAGNESIUM HYDROXIDE 400 MG/5ML PO SUSP: 30.0000 mL | Freq: Every day | ORAL | Status: DC | PRN

## 2021-09-09 MED ORDER — ACETAMINOPHEN 500 MG PO TABS: 500.0000 mg | ORAL_TABLET | Freq: Three times a day (TID) | ORAL | Status: DC | PRN

## 2021-09-09 MED ORDER — LEVOTHYROXINE SODIUM 112 MCG PO TABS
112.0000 ug | ORAL_TABLET | ORAL | Status: DC
  Administered 2021-09-10: 112 ug via ORAL
  Filled 2021-09-09: qty 1

## 2021-09-09 MED ORDER — ONDANSETRON 4 MG PO TBDP
4.0000 mg | ORAL_TABLET | Freq: Once | ORAL | Status: AC
  Administered 2021-09-09: 4 mg via ORAL
  Filled 2021-09-09: qty 1

## 2021-09-09 NOTE — ED Provider Notes (Signed)
Behavioral Health Urgent Care Medical Screening Exam ? ?Patient Name: Colleen [name] ?MRN: [id number] ?Date of Evaluation: 09/09/21 ?Chief Complaint:   ?Diagnosis:  ?Final diagnoses:  ?MDD (major depressive disorder), recurrent severe, without psychosis (HCC)  ? ?Colleen [name], 71 y.o., female patient seen face to face by this provider, consulted with [name]; and chart reviewed on 09/09/21.  She presents today with her daughter.  She reports increased depression, anxiety, and suicidal ideations with a plan.  ? ?Per chart patient has a past psychiatric history of MDD and anxiety.  She is prescribed Xanax 0.5 mg tablets twice daily.  However patient states she has recently taken up to 2 mg per day to try and control her anxiety.  She is prescribed 5 mg Lexapro daily for depression.  Reports she has been on multiple psychotropic medications throughout the years.  Review patient was seen at [facility] ED on 09/02/2021 related to suicidal ideations.  She was recommended for [facility] psychiatric admission..  No bed was available and patient was cleared in the ED.  She was able to safety plan and return home.  She was referred to the PHP/IOP program.  Patient attended her first session today.  States the group stuff did not pertain to her.  She requested to be discharged from the program.  She presented to [facility] on 09/07/2021 with increased depression and anxiety.  She was prescribed Lexapro 5 mg daily, with an appointment to see [name] for medication management on 09/12/2021.  She denies any substance use. ? ?Patient presents today tearful and anxious.  She is disheveled.  She is speaking at a clear tone but fast pace and it is pressured at times.  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  Reports she has not been catatonic since 06/2021.  She also reports that her neurological functional disorder can cause her to have spasms at times.  Patient was  observed pushing back in her seat and locking her arms out straight.  She was also nauseous and dry heaving.  She was given Zofran for nausea.  She was administered 0.5 mg tablet of Xanax for anxiety (she is also prescribed this outpatient).  She makes fleeting eye contact.  Reports over this past week her depression and anxiety has continued to increase.  Reports she is now having constant intrusive suicidal thoughts.  States, "if I had the Xanax in front of me I do not know what I would do but my daughter keeps my medicines".  She admits to thinking about overdosing.  States, "I have just given up I just cannot keep doing this".  She cannot contract for safety.  Objectively she does not appear to be responding to internal/external stimuli.  She denies AVH/HI.  She denies paranoid and delusional thought.  She is logical and is able to answer questions appropriately.  ? ?Discussed inpatient gero psychiatric admission and patient agrees.  Explained to patient that if placement cannot be made while at [facility] she would be transferred to the [facility] ED.  ?  ? ?Psychiatric Specialty Exam ? ?Presentation  ?General Appearance:Disheveled ? ?Eye Contact:Fleeting ? ?Speech:Clear and Coherent; Pressured ? ?Speech Volume:Normal ? ?Handedness:Right ? ? ?Mood and Affect  ?Mood:Depressed; Anxious; Hopeless ? ?Affect:Congruent; Tearful; Depressed ? ? ?Thought Process  ?Thought Processes:Coherent ? ?Descriptions of Associations:Intact ? ?Orientation:Full (Time, Place and Person) ? ?Thought Content:Logical ? Diagnosis of Schizophrenia or Schizoaffective disorder in past: No ?  Hallucinations:None ? ?Ideas of Reference:None ? ?Suicidal Thoughts:Yes, Active ?With Intent; With Plan; With Means to Carry Out ?Without Intent; Without Plan; Without Access to Means ? ?Homicidal Thoughts:No ? ? ?Sensorium  ?Memory:Immediate Good; Recent Good; Remote Good ? ?Judgment:Impaired ? ?Insight:Poor ? ? ?Executive Functions  ?Concentration:Fair ? ?Attention  Span:Fair ? ?Recall:Fair ? ?Fund of Knowledge:Good ? ?Language:Good ? ? ?Psychomotor Activity  ?Psychomotor Activity:Normal ? ? ?Assets  ?Assets:Communication Skills; Desire for Improvement; Financial Resources/Insurance; Leisure Time; Physical Health; Social Support; Resilience ? ? ?Sleep  ?Sleep:Good ? ?Number of hours: 8 ? ? ?No data recorded ? ?Physical Exam: ?Physical Exam ?Vitals and nursing note reviewed.  ?Constitutional:   ?   General: She is not in acute distress. ?   Appearance: Normal appearance. She is not ill-appearing.  ?HENT:  ?   Head: Normocephalic.  ?Eyes:  ?   General:     ?   Right eye: No discharge.     ?   Left eye: No discharge.  ?   Conjunctiva/sclera: Conjunctivae normal.  ?Cardiovascular:  ?   Rate and Rhythm: Normal rate.  ?Pulmonary:  ?   Effort: Pulmonary effort is normal.  ?Musculoskeletal:     ?   General: Normal range of motion.  ?   Cervical back: Normal range of motion.  ?Skin: ?   Coloration: Skin is not jaundiced or pale.  ?Neurological:  ?   Mental Status: She is alert and oriented to person, place, and time.  ?Psychiatric:     ?   Attention and Perception: Perception normal. She is inattentive.     ?   Mood and Affect: Mood is anxious and depressed. Affect is tearful.     ?   Speech: Speech is rapid and pressured.     ?   Behavior: Behavior is cooperative.     ?   Thought Content: Thought content includes suicidal ideation. Thought content includes suicidal plan.     ?   Cognition and Memory: Cognition normal.     ?   Judgment: Judgment normal.  ? ?Review of Systems  ?Constitutional: Negative.   ?HENT: Negative.    ?Eyes: Negative.   ?Respiratory: Negative.    ?Cardiovascular: Negative.   ?Musculoskeletal: Negative.   ?Skin: Negative.   ?Neurological: Negative.   ?     Body will randomly jerk- states this is due to her neurological dysfunctional disorder   ?Psychiatric/Behavioral:  Positive for depression and suicidal ideas. The patient is nervous/anxious.   ?Blood pressure (!)  147/100, pulse (!) 110, temperature 98.2 ?F (36.8 ?C), temperature source Oral, resp. rate 19, SpO2 100 %. There is no height or weight on file to calculate BMI. ? ?Musculoskeletal: ?Strength & Muscle Tone: within normal limits ?Gait & Station: normal ?Patient leans: N/A ? ? ?[facility] Discharge Disposition for Follow up and Recommendations: ?Based on my evaluation I certify that psychiatric inpatient services furnished can reasonably be expected to improve the patient's condition which I recommend transfer to an appropriate accepting facility.  ? ?Patient meets criteria for inpatient  gero psychiatric admission.  [facility] notified and they have no bed availability for today.  Social worker contacted and patient has been faxed out.  Patient will be transferred to the [facility] ED while awaiting inpatient admission.  Dr. Audley Hose accepting physician notified.  Jerilee Hoh RN notified.  Patient will be transported via nonemergent EMS. ? ?Lab work ordered at [facility] UC: CBC with differential CMP, ethanol, hemoglobin A1c, lipid panel, magnesium, U/A,  UDS, COVID POC and PCR.  ? ?EKG was ordered but was not completed due to patient's anxiety and jerking motions. PLEASE ORDER EKG IN THE ED.  ? ?Patient was administered 4 mg of Zofran for nausea/heaving and 0.5 mg Xanax tablet for anxiety while at [facility]. ? ? ?Ardis Hughs, NP ?09/09/2021, 5:29 PM ? ?

## 2021-09-09 NOTE — ED Notes (Signed)
Per [facility], assessment, TTS completed-patient is waiting for geripsych placement-[facility] is currently at capacity-SW working on placemet ?

## 2021-09-09 NOTE — ED Triage Notes (Signed)
Patient transfer from [facility].  Patient having suicidal ideation. Patient was assessed at [facility] ?

## 2021-09-09 NOTE — Progress Notes (Signed)
Pt was accepted to [facility] 09/10/21 after 9:00am ? ?Pt meets inpatient criteria per Vernard Gambles, NP ? ?Attending Physician will be Dr. Roselyn Reef ? ?Report can be called to: [phone] ? ?Pt can arrive after 9:00am ? ?Care Team notified: Gwyneth Sprout, MD, and Silvano Bilis, RN. ? ?Kelton Pillar, LCSWA ?09/09/2021 @ 11:52 PM ? ?

## 2021-09-09 NOTE — Progress Notes (Addendum)
[place] Placement  ? ?Pt meets inpatient criteria per Thomes Lolling, NP. There are no appropriate beds at [facility]. Per [place] Nicoletta Ba, Counselor there are no appropriate beds at [facility] in their [place] unit. Referral was sent to the following facilities;  ? ? ?Destination ?Service Provider Address Phone Fax  ?[facility] [address] [place] [address] [phone]  ?[facility]  [address] [place] [address] [phone]  ?[place], [name] [id number] [phone]  ?[facility]  [place], [place] [address] [phone]  ?[facility] [address] [place] [address] [phone]  ?[place]  [place], [place] [address] [phone]  ?[place] [place] [address] [phone]  ?[facility]  [address], [place] [address] [phone]  ?[place] [address], [place] [address] [id number]  ?[facility]  [address] [place] [address] [phone]  ?[place], [place] [address] [phone]  ?[facility]  [address], [place] [address] [phone]  ?[place]  [address] [place] [address] [phone]  ?[place] [address] [place] [address] [phone]  ?[facility]  [place], [place] [address] [phone]  ?[facility]  [place], [place] [address] [phone]  ?[place], [place] [phone]  ?[facility]  [address] [place] [address] [phone]  ? ?Situation ongoing,  CSW will follow up. ? ? ?Benjaman Kindler, MSW, LCSWA ?09/09/2021  @ 7:48 PM ? ?

## 2021-09-09 NOTE — ED Provider Notes (Signed)
?Colleen [name] ?Provider Note ? ? ?CSN: [id number] ?Arrival date & time: 09/09/21  1824 ? ?  ? ?History ? ?Chief Complaint  ?Patient presents with  ? Suicidal  ? ? ?Colleen [name] is a 71 y.o. female. ? ? Pt is a 71y/o female with hx of increased depression, anxiety, and suicidal ideations with a plan which she is admitting has been acute for the last 12 days.  She cannot recall anything specifically that brought this on but it just came on suddenly.  She reports that she is just ruminating over ways that she could die.  She did give her daughter her Xanax because she thought the first thing she could do was overdose.  However she has not been sleeping well she continues to have these thoughts and just reports she is not getting any better.  She has been seen multiple times through the emergency room and urgent care and they did start some medication changes but she reports its not helping and she feels even worse.  She has consented for geropsych inpatient treatment and is coming from urgent care today awaiting placement.  She denies any particular medical issues at this time she has not had cough, congestion, abdominal pain, chest pain, shortness of breath.  She denies any urinary symptoms.  She has taken her morning medications. ? ?The history is provided by the patient and medical records.  ? ?  ? ?Home Medications ?Prior to Admission medications   ?Medication Sig Start Date End Date Taking? Authorizing Provider  ?acetaminophen (TYLENOL) 500 MG tablet Take 500-1,000 mg by mouth every 8 (eight) hours as needed for mild pain or headache.     [provider]  ?ALPRAZolam Prudy Feeler) 0.5 MG tablet Take 0.5 mg by mouth 2 (two) times daily.    [provider]  ?escitalopram (LEXAPRO) 10 MG tablet Take 0.5 tablets (5 mg total) by mouth daily. 09/07/21 09/07/22  Ardis Hughs, NP  ?levothyroxine (SYNTHROID) 112 MCG tablet Take 112 mcg by mouth See admin instructions.  once daily Monday's, Wednesday's, Friday's, and Sunday's. 09/01/21   [provider]  ?levothyroxine (SYNTHROID, LEVOTHROID) 100 MCG tablet TAKE 1 TABLET DAILY ?Patient taking differently: Take 100 mcg by mouth See admin instructions. once daily every Tuesday's, Thursday's, and Saturday's 08/24/16   Roderick Pee, MD  ?losartan (COZAAR) 100 MG tablet Take 50 mg by mouth daily. 07/05/21   [provider]  ?Multiple Vitamin (MULTIVITAMIN) tablet Take 1 tablet by mouth in the morning and at bedtime.    [provider]  ?   ? ?Allergies    ?Amlodipine, Cyclobenzaprine, Cyclobenzaprine hcl, Lamictal [lamotrigine], Norvasc [amlodipine besylate], Other, Sertraline, Trazodone, and Tape   ? ?Review of Systems   ?Review of Systems ? ?Physical Exam ?Updated Vital Signs ?BP (!) 160/110 (BP Location: Right Arm)   Pulse 92   Temp (!) 97.4 ?F (36.3 ?C) (Oral)   Resp 20   SpO2 100%  ?Physical Exam ?Vitals and nursing note reviewed.  ?Constitutional:   ?   General: She is not in acute distress. ?   Appearance: She is well-developed.  ?   Comments: Disheveled mildly anxious  ?HENT:  ?   Head: Normocephalic and atraumatic.  ?Eyes:  ?   Pupils: Pupils are equal, round, and reactive to light.  ?Cardiovascular:  ?   Rate and Rhythm: Normal rate and regular rhythm.  ?   Heart sounds: Normal heart sounds. No murmur heard. ?  No friction rub.  ?Pulmonary:  ?   Effort: Pulmonary effort is normal.  ?   Breath sounds: Normal breath sounds. No wheezing or rales.  ?Abdominal:  ?   General: Bowel sounds are normal. There is no distension.  ?   Palpations: Abdomen is soft.  ?   Tenderness: There is no abdominal tenderness. There is no guarding or rebound.  ?Musculoskeletal:     ?   General: No tenderness. Normal range of motion.  ?   Comments: No edema  ?Skin: ?   General: Skin is warm and dry.  ?   Findings: No rash.  ?Neurological:  ?   Mental Status: She is alert and oriented to person, place, and  time.  ?   Cranial Nerves: No cranial nerve deficit.  ?Psychiatric:     ?   Mood and Affect: Mood is anxious. Affect is tearful.     ?   Speech: Speech normal.     ?   Behavior: Behavior normal. Behavior is cooperative.     ?   Thought Content: Thought content includes suicidal ideation.  ? ? ?ED Results / Procedures / Treatments   ?Labs ?(all labs ordered are listed, but only abnormal results are displayed) ?Labs Reviewed  ?URINE CULTURE  ? ? ?EKG ?None ? ?Radiology ?No results found. ? ?Procedures ?Procedures  ? ? ?Medications Ordered in ED ?Medications  ?cephALEXin (KEFLEX) capsule 500 mg (has no administration in time range)  ?LORazepam (ATIVAN) tablet 0.5 mg (0.5 mg Oral Given 09/09/21 1910)  ? ? ?ED Course/ Medical Decision Making/ A&P ?  ?                        ?Medical Decision Making ?Amount and/or Complexity of Data Reviewed ?External Data Reviewed: labs and notes. ? ?Risk ?Decision regarding hospitalization. ? ? ?Elderly female being sent here from behavioral health urgent care today waiting on inpatient geropsych.  She had labs done prior to her arrival that showed a normal CBC, CMP, COVID test.  Alcohol was negative.  UDS was positive for benzos which she takes chronically.  Patient's UA does have greater than 50 white cells and large leukocytes and many bacteria.  Urine done 7 days ago did look like possible early UTI and looks worse now.  We will treat with antibiotics and culture.  Patient otherwise is waiting for inpatient.  She has no other medical conditions at this time and is medically clear.  Home meds were ordered.  External medical records were referred reviewed from urgent care visit. ? ? ? ? ? ? ? ?Final Clinical Impression(s) / ED Diagnoses ?Final diagnoses:  ?Suicidal ideation  ?Depression, unspecified depression type  ?Anxiety  ? ? ?Rx / DC Orders ?ED Discharge Orders   ? ? None  ? ?  ? ? ?  ?Gwyneth Sprout, MD ?09/09/21 2051 ? ?

## 2021-09-09 NOTE — Progress Notes (Signed)
CSW spoke with [facility] who advised that referral was received but there was no behavioral health notes. CSW sent custom document [facility] and all other out of network facilitates to review for inpatient behavioral health placement.  ? ? ? ?Maryjean Ka, MSW, LCSWA ?09/09/2021 8:01 PM ? ? ?

## 2021-09-09 NOTE — BH Assessment (Signed)
Comprehensive Clinical Assessment (CCA) Note  09/09/2021 Colleen [name] [id number]  Disposition: Per Vernard Gambles, NP, patient is recommended for inpatient treatment.    Flowsheet Row ED from 09/09/2021 in [facility] Counselor from 09/07/2021 in [facility] ED from 09/02/2021 in [facility] EMERGENCY DEPARTMENT  C-SSRS RISK CATEGORY Moderate Risk Error: Question 6 not populated No Risk      The patient demonstrates the following risk factors for suicide: Chronic risk factors for suicide include: psychiatric disorder of depression . Acute risk factors for suicide include: N/A. Protective factors for this patient include: positive social support, positive therapeutic relationship, responsibility to others (children, family), and hope for the future. Considering these factors, the overall suicide risk at this point appears to be moderate. Patient is not appropriate for outpatient follow up.  Colleen [name] is a 71 year old female presenting to [facility] twice this week with worsening depression and anxiety. Patient reports after leaving [facility] on 2/27 her symptoms worsen, and she began to have intrusive thoughts on pouring all my Xanax on the table and taking them. Patient states I can't do it on my own no more. I'm just giving up. Patient daughter is present this today reporting that patient has gotten worse since last seen and she is having catatonic like episodes that looks like she is having a seizure. Daughter also reports that patient has episodes of muscle spasms several times today. Daughter reports that she has been trying to support her mom and help her out the best way she can, but she is no longer able to guarantee she is safe and feels she needs inpatient treatment.  Patient is oriented x4, engaged, alert and cooperative during assessment. Patient is anxious and tearful. Patient reports SI with thoughts of overdosing on  her medications and cannot contact for safety. Patient denies HI, AVH.   Chief Complaint:  Chief Complaint  Patient presents with   Depression   Visit Diagnosis: MDD, severe, recurrent, without psychotic features.     CCA Screening, Triage and Referral (STR)  Patient Reported Information How did you hear about [name]? Family/Friend  What Is the Reason for Your Visit/Call Today? Worsening depression, SI  How Long Has This Been Causing You Problems? > than 6 months  What Do You Feel Would Help You the Most Today? Treatment for Depression or other mood problem   Have You Recently Had Any Thoughts About Hurting Yourself? Yes  Are You Planning to Commit Suicide/Harm Yourself At This time? No   Have you Recently Had Thoughts About Hurting Someone Karolee Ohs? No  Are You Planning to Harm Someone at This Time? No  Explanation: No data recorded  Have You Used Any Alcohol or Drugs in the Past 24 Hours? No  How Long Ago Did You Use Drugs or Alcohol? No data recorded What Did You Use and How Much? No data recorded  Do You Currently Have a Therapist/Psychiatrist? Yes  Name of Therapist/Psychiatrist: IOP   Have You Been Recently Discharged From Any Office Practice or Programs? No  Explanation of Discharge From Practice/Program: No data recorded    CCA Screening Triage Referral Assessment Type of Contact: Face-to-Face  Telemedicine Service Delivery:   Is this Initial or Reassessment? No data recorded Date Telepsych consult ordered in CHL:  No data recorded Time Telepsych consult ordered in CHL:  No data recorded Location of Assessment: [facility] Assessment Services  Provider Location: [facility] Assessment Services   Collateral Involvement: Daughter  Does Patient Have a Automotive engineer Guardian? No data recorded Name and Contact of Legal Guardian: No data recorded If Minor and Not Living with Parent(s), Who has Custody? No data recorded Is CPS involved or ever been involved?  No data recorded Is APS involved or ever been involved? No data recorded  Patient Determined To Be At Risk for Harm To Self or Others Based on Review of Patient Reported Information or Presenting Complaint? Yes, for Self-Harm  Method: No data recorded Availability of Means: No data recorded Intent: No data recorded Notification Required: No data recorded Additional Information for Danger to Others Potential: No data recorded Additional Comments for Danger to Others Potential: No data recorded Are There Guns or Other Weapons in Your Home? No data recorded Types of Guns/Weapons: No data recorded Are These Weapons Safely Secured?                            No data recorded Who Could Verify You Are Able To Have These Secured: No data recorded Do You Have any Outstanding Charges, Pending Court Dates, Parole/Probation? No data recorded Contacted To Inform of Risk of Harm To Self or Others: Family/Significant Other:    Does Patient Present under Involuntary Commitment? No  IVC Papers Initial File Date: No data recorded  [place] of Residence: [place]   Patient Currently Receiving the Following Services: IOP (Intensive Outpatient Program)   Determination of Need: Urgent (48 hours)   Options For Referral: Inpatient Hospitalization     CCA Biopsychosocial Patient Reported Schizophrenia/Schizoaffective Diagnosis in Past: No   Strengths: No data recorded  Mental Health Symptoms Depression:   Change in energy/activity; Difficulty Concentrating; Irritability; Hopelessness; Sleep (too much or little); Tearfulness; Worthlessness; Increase/decrease in appetite   Duration of Depressive symptoms:  Duration of Depressive Symptoms: Greater than two weeks   Mania:   None   Anxiety:    Difficulty concentrating; Fatigue; Irritability; Restlessness; Sleep; Tension; Worrying   Psychosis:   None   Duration of Psychotic symptoms:    Trauma:   None   Obsessions:   None    Compulsions:   None   Inattention:   None   Hyperactivity/Impulsivity:   None   Oppositional/Defiant Behaviors:   None   Emotional Irregularity:   None   Other Mood/Personality Symptoms:  No data recorded   Mental Status Exam Appearance and self-care  Stature:   Average   Weight:   Average weight   Clothing:   Neat/clean; Age-appropriate   Grooming:   Normal   Cosmetic use:   None   Posture/gait:   Normal   Motor activity:   Not Remarkable   Sensorium  Attention:   Normal   Concentration:   Anxiety interferes   Orientation:   X5   Recall/memory:   Normal   Affect and Mood  Affect:   Anxious; Depressed; Tearful   Mood:   Depressed; Anxious   Relating  Eye contact:   Normal   Facial expression:   Anxious   Attitude toward examiner:   Argumentative   Thought and Language  Speech flow:  Clear and Coherent   Thought content:   Appropriate to Mood and Circumstances   Preoccupation:   None   Hallucinations:   None   Organization:  No data recorded  [employer] of Knowledge:   Fair   Intelligence:   Average   Abstraction:   Normal   Judgement:  Good   Reality Testing:   Adequate   Insight:   Good   Decision Making:   Normal   Social Functioning  Social Maturity:   Responsible   Social Judgement:   Normal   Stress  Stressors:   Illness   Coping Ability:   Overwhelmed; Exhausted   Skill Deficits:   None   Supports:   Family; Friends/Service system     Religion:    Leisure/Recreation:    Exercise/Diet: Exercise/Diet Do You Exercise?: No Do You Have Any Trouble Sleeping?: Yes   CCA Employment/Education Employment/Work Situation: Employment / Work Situation Employment Situation: Retired Passenger transport manager has Been Impacted by Current Illness: No Has Patient ever Been in Equities trader?: No  Education: Education Is Patient Currently Attending School?: No   CCA  Family/Childhood History Family and Relationship History: Family history Does patient have children?: Yes How many children?: 1 How is patient's relationship with their children?: good  Childhood History:  Childhood History By whom was/is the patient raised?: Both parents  Child/Adolescent Assessment:     CCA Substance Use Alcohol/Drug Use: Alcohol / Drug Use Pain Medications: See MAR Prescriptions: See MAR Over the Counter: See MAR History of alcohol / drug use?: No history of alcohol / drug abuse Longest period of sobriety (when/how long): NA                         ASAM's:  Six Dimensions of Multidimensional Assessment  Dimension 1:  Acute Intoxication and/or Withdrawal Potential:      Dimension 2:  Biomedical Conditions and Complications:      Dimension 3:  Emotional, Behavioral, or Cognitive Conditions and Complications:     Dimension 4:  Readiness to Change:     Dimension 5:  Relapse, Continued use, or Continued Problem Potential:     Dimension 6:  Recovery/Living Environment:     ASAM Severity Score:    ASAM Recommended Level of Treatment:     Substance use Disorder (SUD)    Recommendations for Services/Supports/Treatments:    Discharge Disposition:    DSM5 Diagnoses: Patient Active Problem List   Diagnosis Date Noted   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

## 2021-09-09 NOTE — ED Notes (Signed)
Daughter Annett Fabian was present during pt's assessment but had to return home.  Pt's daughter request to be updated when her mother is moved to another facility.  Pt is in agreement with this.  Daughter can be contacted at [phone].  Call has been placed to notify Nicholos Johns that her mother will be transferring to [facility].  Safety maintained  ?

## 2021-09-09 NOTE — Progress Notes (Signed)
Pt transferred to [facility] via [facility] to  await Gero-psych placement. ?

## 2021-09-09 NOTE — Discharge Instructions (Addendum)
Transfer to [facility] while awaiting Geropsych inpatient bed availability  ?

## 2021-09-10 ENCOUNTER — Ambulatory Visit (HOSPITAL_COMMUNITY): Payer: Self-pay

## 2021-09-10 DIAGNOSIS — F332 Major depressive disorder, recurrent severe without psychotic features: Secondary | ICD-10-CM | POA: Diagnosis not present

## 2021-09-10 NOTE — ED Notes (Signed)
Pt agrees to voluntarily go to [name]. Called [facility] to give report. No answer. Will try again. ?

## 2021-09-10 NOTE — ED Notes (Signed)
Gave report to Annetta Maw, RN at [facility] [phone] .  ?

## 2021-09-10 NOTE — ED Notes (Signed)
Returned pt belongings. When asked if all belongings are present, pt stated, "They are all on me." Pt confirms all belongings are present.  ?

## 2021-09-10 NOTE — ED Notes (Signed)
Patient has been compliant and slept well after taking her medication.  ?

## 2021-09-11 ENCOUNTER — Ambulatory Visit (HOSPITAL_COMMUNITY): Payer: Self-pay

## 2021-09-11 LAB — URINE CULTURE: Culture: NO GROWTH

## 2021-09-12 ENCOUNTER — Other Ambulatory Visit: Payer: Self-pay

## 2021-09-12 ENCOUNTER — Telehealth (HOSPITAL_COMMUNITY): Payer: Medicare Other | Admitting: Psychiatry

## 2021-09-14 ENCOUNTER — Ambulatory Visit (HOSPITAL_COMMUNITY): Payer: Self-pay

## 2021-09-15 ENCOUNTER — Ambulatory Visit (HOSPITAL_COMMUNITY): Payer: Self-pay

## 2021-09-16 ENCOUNTER — Ambulatory Visit (HOSPITAL_COMMUNITY): Payer: Self-pay

## 2021-09-17 ENCOUNTER — Telehealth (HOSPITAL_COMMUNITY): Payer: Self-pay | Admitting: Family Medicine

## 2021-09-17 ENCOUNTER — Ambulatory Visit (HOSPITAL_COMMUNITY): Payer: Self-pay

## 2021-09-17 NOTE — BH Assessment (Signed)
Care Management - Follow Up [facility] Discharges  ? ?Patient has been placed in an inpatient psychiatric hospital [facility]) on 09-10-2021. ?

## 2021-09-18 ENCOUNTER — Ambulatory Visit (HOSPITAL_COMMUNITY): Payer: Self-pay

## 2021-09-21 ENCOUNTER — Ambulatory Visit (HOSPITAL_COMMUNITY): Payer: Self-pay

## 2021-09-22 ENCOUNTER — Ambulatory Visit (HOSPITAL_COMMUNITY): Payer: Self-pay

## 2021-09-23 ENCOUNTER — Ambulatory Visit (HOSPITAL_COMMUNITY): Payer: Self-pay

## 2021-09-24 ENCOUNTER — Ambulatory Visit (HOSPITAL_COMMUNITY): Payer: Self-pay

## 2021-09-25 ENCOUNTER — Ambulatory Visit (HOSPITAL_COMMUNITY): Payer: Self-pay

## 2021-10-06 ENCOUNTER — Other Ambulatory Visit: Payer: Self-pay

## 2021-10-06 ENCOUNTER — Encounter (HOSPITAL_BASED_OUTPATIENT_CLINIC_OR_DEPARTMENT_OTHER): Payer: Self-pay | Admitting: Emergency Medicine

## 2021-10-06 ENCOUNTER — Emergency Department (HOSPITAL_BASED_OUTPATIENT_CLINIC_OR_DEPARTMENT_OTHER)
Admission: EM | Admit: 2021-10-06 | Discharge: 2021-10-07 | Disposition: A | Discharge status: Home / Self Care | Payer: Medicare Other | Attending: Emergency Medicine | Admitting: Emergency Medicine

## 2021-10-06 DIAGNOSIS — Z79899 Other long term (current) drug therapy: Secondary | ICD-10-CM | POA: Insufficient documentation

## 2021-10-06 DIAGNOSIS — R45851 Suicidal ideations: Secondary | ICD-10-CM | POA: Diagnosis not present

## 2021-10-06 DIAGNOSIS — F319 Bipolar disorder, unspecified: Secondary | ICD-10-CM | POA: Insufficient documentation

## 2021-10-06 DIAGNOSIS — F419 Anxiety disorder, unspecified: Secondary | ICD-10-CM | POA: Diagnosis present

## 2021-10-06 DIAGNOSIS — E871 Hypo-osmolality and hyponatremia: Secondary | ICD-10-CM

## 2021-10-06 DIAGNOSIS — F411 Generalized anxiety disorder: Secondary | ICD-10-CM | POA: Diagnosis not present

## 2021-10-06 DIAGNOSIS — Z20822 Contact with and (suspected) exposure to covid-19: Secondary | ICD-10-CM | POA: Insufficient documentation

## 2021-10-06 LAB — RAPID URINE DRUG SCREEN, HOSP PERFORMED
Amphetamines: NOT DETECTED
Barbiturates: NOT DETECTED
Benzodiazepines: POSITIVE — AB
Cocaine: NOT DETECTED
Opiates: NOT DETECTED
Tetrahydrocannabinol: NOT DETECTED

## 2021-10-06 LAB — CBC WITH DIFFERENTIAL/PLATELET
Abs Immature Granulocytes: 0.02 10*3/uL (ref 0.00–0.07)
Basophils Absolute: 0 10*3/uL (ref 0.0–0.1)
Basophils Relative: 1 %
Eosinophils Absolute: 0.1 10*3/uL (ref 0.0–0.5)
Eosinophils Relative: 1 %
HCT: 40.3 % (ref 36.0–46.0)
Hemoglobin: 14.3 g/dL (ref 12.0–15.0)
Immature Granulocytes: 0 %
Lymphocytes Relative: 17 %
Lymphs Abs: 1.1 10*3/uL (ref 0.7–4.0)
MCH: 32.2 pg (ref 26.0–34.0)
MCHC: 35.5 g/dL (ref 30.0–36.0)
MCV: 90.8 fL (ref 80.0–100.0)
Monocytes Absolute: 0.5 10*3/uL (ref 0.1–1.0)
Monocytes Relative: 7 %
Neutro Abs: 4.9 10*3/uL (ref 1.7–7.7)
Neutrophils Relative %: 74 %
Platelets: 343 10*3/uL (ref 150–400)
RBC: 4.44 MIL/uL (ref 3.87–5.11)
RDW: 11.6 % (ref 11.5–15.5)
WBC: 6.7 10*3/uL (ref 4.0–10.5)
nRBC: 0 % (ref 0.0–0.2)

## 2021-10-06 LAB — BASIC METABOLIC PANEL
Anion gap: 8 (ref 5–15)
BUN: 6 mg/dL — ABNORMAL LOW (ref 8–23)
CO2: 26 mmol/L (ref 22–32)
Calcium: 9 mg/dL (ref 8.9–10.3)
Chloride: 93 mmol/L — ABNORMAL LOW (ref 98–111)
Creatinine, Ser: 0.73 mg/dL (ref 0.44–1.00)
GFR, Estimated: >60 mL/min (ref >60)
Glucose, Bld: 123 mg/dL — ABNORMAL HIGH (ref 70–99)
Potassium: 3.6 mmol/L (ref 3.5–5.1)
Sodium: 127 mmol/L — ABNORMAL LOW (ref 135–145)

## 2021-10-06 LAB — URINALYSIS, ROUTINE W REFLEX MICROSCOPIC
Bilirubin Urine: NEGATIVE
Glucose, UA: NEGATIVE mg/dL
Hgb urine dipstick: NEGATIVE
Ketones, ur: 15 mg/dL — AB
Nitrite: NEGATIVE
Protein, ur: NEGATIVE mg/dL
Specific Gravity, Urine: 1.011 (ref 1.005–1.030)
pH: 7 (ref 5.0–8.0)

## 2021-10-06 LAB — COMPREHENSIVE METABOLIC PANEL
ALT: 17 U/L (ref 0–44)
AST: 18 U/L (ref 15–41)
Albumin: 4.8 g/dL (ref 3.5–5.0)
Alkaline Phosphatase: 61 U/L (ref 38–126)
Anion gap: 11 (ref 5–15)
BUN: 7 mg/dL — ABNORMAL LOW (ref 8–23)
CO2: 26 mmol/L (ref 22–32)
Calcium: 9.9 mg/dL (ref 8.9–10.3)
Chloride: 89 mmol/L — ABNORMAL LOW (ref 98–111)
Creatinine, Ser: 0.83 mg/dL (ref 0.44–1.00)
GFR, Estimated: >60 mL/min (ref >60)
Glucose, Bld: 100 mg/dL — ABNORMAL HIGH (ref 70–99)
Potassium: 4 mmol/L (ref 3.5–5.1)
Sodium: 126 mmol/L — ABNORMAL LOW (ref 135–145)
Total Bilirubin: 0.6 mg/dL (ref 0.3–1.2)
Total Protein: 7.9 g/dL (ref 6.5–8.1)

## 2021-10-06 LAB — RESP PANEL BY RT-PCR (FLU A&B, COVID) ARPGX2
Influenza A by PCR: NEGATIVE
Influenza B by PCR: NEGATIVE
SARS Coronavirus 2 by RT PCR: NEGATIVE

## 2021-10-06 LAB — ETHANOL: Alcohol, Ethyl (B): <10 mg/dL (ref <10)

## 2021-10-06 MED ORDER — ALPRAZOLAM 0.5 MG PO TABS
1.0000 mg | ORAL_TABLET | ORAL | Status: DC
  Filled 2021-10-06: qty 2

## 2021-10-06 MED ORDER — LEVOTHYROXINE SODIUM 112 MCG PO TABS
112.0000 ug | ORAL_TABLET | ORAL | Status: DC
  Filled 2021-10-06 (×2): qty 1

## 2021-10-06 MED ORDER — LORAZEPAM 2 MG/ML IJ SOLN
1.0000 mg | Freq: Once | INTRAMUSCULAR | Status: AC
  Administered 2021-10-06: 1 mg via INTRAVENOUS
  Filled 2021-10-06: qty 1

## 2021-10-06 MED ORDER — HYDROXYZINE HCL 25 MG PO TABS
25.0000 mg | ORAL_TABLET | Freq: Two times a day (BID) | ORAL | Status: DC
  Administered 2021-10-06 – 2021-10-07 (×3): 25 mg via ORAL
  Filled 2021-10-06 (×4): qty 1

## 2021-10-06 MED ORDER — LEVOTHYROXINE SODIUM 100 MCG PO TABS: 100.0000 ug | ORAL_TABLET | ORAL | Status: DC

## 2021-10-06 MED ORDER — CARBAMAZEPINE 200 MG PO TABS
100.0000 mg | ORAL_TABLET | Freq: Every day | ORAL | Status: DC
  Filled 2021-10-06 (×2): qty 0.5

## 2021-10-06 MED ORDER — LOSARTAN POTASSIUM 25 MG PO TABS: 50.0000 mg | ORAL_TABLET | Freq: Every day | ORAL | Status: DC

## 2021-10-06 MED ORDER — LORAZEPAM 2 MG/ML IJ SOLN
2.0000 mg | Freq: Once | INTRAMUSCULAR | Status: AC
  Administered 2021-10-06: 2 mg via INTRAMUSCULAR
  Filled 2021-10-06: qty 1

## 2021-10-06 MED ORDER — ACETAMINOPHEN 500 MG PO TABS: 500.0000 mg | ORAL_TABLET | Freq: Three times a day (TID) | ORAL | Status: DC | PRN

## 2021-10-06 MED ORDER — ESCITALOPRAM OXALATE 10 MG PO TABS
5.0000 mg | ORAL_TABLET | Freq: Every day | ORAL | Status: DC
  Administered 2021-10-07: 5 mg via ORAL
  Filled 2021-10-06 (×2): qty 1

## 2021-10-06 MED ORDER — ADULT MULTIVITAMIN W/MINERALS CH
1.0000 | ORAL_TABLET | Freq: Every day | ORAL | Status: DC
  Administered 2021-10-06 – 2021-10-07 (×2): 1 via ORAL
  Filled 2021-10-06 (×2): qty 1

## 2021-10-06 MED ORDER — LOSARTAN POTASSIUM 25 MG PO TABS
50.0000 mg | ORAL_TABLET | Freq: Every day | ORAL | Status: DC
  Administered 2021-10-07: 50 mg via ORAL
  Filled 2021-10-06 (×3): qty 2

## 2021-10-06 MED ORDER — LACTATED RINGERS IV BOLUS
1000.0000 mL | Freq: Once | INTRAVENOUS | Status: AC
  Administered 2021-10-06: 1000 mL via INTRAVENOUS

## 2021-10-06 MED ORDER — ALPRAZOLAM 0.5 MG PO TABS
0.5000 mg | ORAL_TABLET | Freq: Once | ORAL | Status: AC
  Administered 2021-10-06: 0.5 mg via ORAL
  Filled 2021-10-06: qty 1

## 2021-10-06 MED ORDER — VITAMIN D 25 MCG (1000 UNIT) PO TABS
5000.0000 [IU] | ORAL_TABLET | Freq: Every day | ORAL | Status: DC
  Administered 2021-10-06: 5000 [IU] via ORAL
  Filled 2021-10-06: qty 5

## 2021-10-06 MED ORDER — ALPRAZOLAM 0.5 MG PO TABS
0.5000 mg | ORAL_TABLET | Freq: Three times a day (TID) | ORAL | Status: DC
  Administered 2021-10-06 – 2021-10-07 (×3): 0.5 mg via ORAL
  Filled 2021-10-06 (×3): qty 1

## 2021-10-06 MED ORDER — LACTATED RINGERS IV SOLN: INTRAVENOUS | Status: DC

## 2021-10-06 MED ORDER — HYDROXYZINE PAMOATE 25 MG PO CAPS
25.0000 mg | ORAL_CAPSULE | Freq: Two times a day (BID) | ORAL | Status: DC
Start: 2021-10-06 — End: 2021-10-06

## 2021-10-06 NOTE — ED Notes (Signed)
Pt stating that she is having back spasms and shaking all over. Josh - Medic aware. ?

## 2021-10-06 NOTE — BH Assessment (Signed)
Comprehensive Clinical Assessment (CCA) Note ? ?10/07/2021 ?Colleen MuskratAnne Kathleen Mathias ?[id number] ? ?Discharge Disposition: ?Melbourne Abtsody Taylor, PA-C, reviewed pt's chart and information and determined pt should receive continuous assessment and be re-assessed by psychiatry in the morning. Pt is to remain at [facility] at this time. This information was relayed to pt's team at 0213. ? ?The patient demonstrates the following risk factors for suicide: Chronic risk factors for suicide include: psychiatric disorder of Bipolar II (per hx) and GAD and medical illness of functional movement disorder . Acute risk factors for suicide include: social withdrawal/isolation. Protective factors for this patient include: positive social support, positive therapeutic relationship, and hope for the future. Considering these factors, the overall suicide risk at this point appears to be moderate. Patient is not appropriate for outpatient follow up. ? ?Therefore, a 1:2 sitter is recommended for suicide precautions. ? ?Chief Complaint:  ?Chief Complaint  ?Patient presents with  ? Anxiety  ? Depression  ? Suicidal  ? ?Visit Diagnosis: Bipolar II (per hx), GAD ? ?CCA Screening, Triage and Referral (STR) ?Colleen [name] is a 71 year old patient who drove herself to [facility] due to ongoing difficulties managing her depression and anxiety, seemingly made worse by an increase in her medication last week. Pt states, "I've been dealing with accute depression and anxiety for about 4 1/2 weeks now. 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 When my anxiety gets bad the functional movement disorder gets worse."  ? ?Pt shares she was experiencing anxiety at the beginning of March and was, thus, hospitalized at [facility] from March 1 - March 7. She shares her medications were changed at this time and that they have been changed again since then. Pt shares that she has been on Lexapro 5mg  for 3 1/2 weeks and that on Wednesday (March 22) her dose was  increased to 10mg . She shares she was fine Wed - Friday but that since Saturday things have been terrible and her anxiety has been "through the roof."  ? ?Pt acknowledges current and previous SI - she shares the SI had "mostly gone away until Saturday." Pt denies she's ever attempted to kill herself or that she has a plan to kill herself. 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 Pt denies HI, AVH, NSSIB, access to guns/weapons, engagement with the legal system, or SA. ? ?Pt is oriented x5. Her recent/remote memory is intact. Pt was cooperative, though blunt, throughout the assessment process. Pt's insight, judgement, and impulse control is fair at this time. ? ?Patient Reported Information ?How did you hear about us? Self ? ?What Is the Reason for Your Visit/Call Today? Pt states, "I've been dealing with accute depression and anxiety for about 4 1/2 weeks now. 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 When my anxiety gets bad the functional movement disorder gets worse." Pt shares she was experiencing anxiety at the beginning of March and was, thus, hospitalized at [facility] from March 1 - March 7. She shares her medications were changed at this time and that they have been changed again since then. Pt shares that she has been on Lexapro 5mg  for 3 1/2 weeks and that on Wednesday (March 22) her dose was increased to 10mg . She shares she was fine Wed - Friday but that since Saturday things have been terrible and her anxiety has been "through the roof." Pt acknowledges current and previous SI - she shares  the SI had "mostly gone away until Saturday." Pt denies she's ever attempted to kill herself or that she has a plan to kill herself. 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 Pt denies HI, AVH, NSSIB, access to guns/weapons,  engagement with the legal system, or SA. ? ?How Long Has This Been Causing You Problems? <Week ? ?What Do You Feel Would Help You the Most Today? Medication(s) ? ? ?Have You Recently Had Any Thoughts About Hurting Yourself? Yes ? ?Are You Planning to Commit Suicide/Harm Yourself At This time? No ? ? ?Have you Recently Had Thoughts About Hurting Someone Karolee Ohs? No ? ?Are You Planning to Harm Someone at This Time? No ? ?Explanation: No data recorded ? ?Have You Used Any Alcohol or Drugs in the Past 24 Hours? No ? ?How Long Ago Did You Use Drugs or Alcohol? No data recorded ?What Did You Use and How Much? No data recorded ? ?Do You Currently Have a Therapist/Psychiatrist? Yes ? ?Name of Therapist/Psychiatrist: Pt sees Shanon Rosser, [place] for therapy and Gerrit Heck, P-NP at [facility] for medication ? ? ?Have You Been Recently Discharged From Any Office Practice or Programs? Yes ? ?Explanation of Discharge From Practice/Program: Pt was d/c from [facility] after admission from March 1 - September 16, 2021 ? ? ?  ?CCA Screening Triage Referral Assessment ?Type of Contact: Tele-Assessment ? ?Telemedicine Service Delivery: Telemedicine service delivery: This service was provided via telemedicine using a 2-way, interactive audio and video technology ? ?Is this Initial or Reassessment? Initial Assessment ? ?Date Telepsych consult ordered in CHL:  10/06/21 ? ?Time Telepsych consult ordered in CHL:  1232 ? ?Location of Assessment: Other (comment) ([facility]) ? ?Provider Location: [facility] Assessment Services ? ? ?Collateral Involvement: Pt declined to provide verbal consent for clinician to make contact with friends/relatives. ? ? ?Does Patient Have a Automotive engineer Guardian? No data recorded ?Name and Contact of Legal Guardian: No data recorded ?If Minor and Not Living with Parent(s), Who has Custody? N/A ? ?Is CPS involved or ever been involved? Never ? ?Is APS involved or ever been involved? Never ? ? ?Patient  Determined To Be At Risk for Harm To Self or Others Based on Review of Patient Reported Information or Presenting Complaint? No ? ?Method: No data recorded ?Availability of Means: No data recorded ?Intent: No data recorded ?Notification Required: No data recorded ?Additional Information for Danger to Others Potential: No data recorded ?Additional Comments for Danger to Others Potential: No data recorded ?Are There Guns or Other Weapons in Your Home? No data recorded ?Types of Guns/Weapons: No data recorded ?Are These Weapons Safely Secured?                            No data recorded ?Who Could Verify You Are Able To Have These Secured: No data recorded ?Do You Have any Outstanding Charges, Pending Court Dates, Parole/Probation? No data recorded ?Contacted To Inform of Risk of Harm To Self or Others: -- (N/A) ? ? ? ?Does Patient Present under Involuntary Commitment? No ? ?IVC Papers Initial File Date: No data recorded ? ?[place] of Residence: [place] ? ? ?Patient Currently Receiving the Following Services: Medication Management; Individual Therapy ? ? ?Determination of Need: Urgent (48 hours) ? ? ?Options For Referral: Medication Management; Outpatient Therapy; Other: Comment (Continuous Assessment at [facility]) ? ? ? ? ?  CCA Biopsychosocial ?Patient Reported Schizophrenia/Schizoaffective Diagnosis in Past: No ? ? ?Strengths: Pt is able to advocate for herself. Pt is able to identify and express her thoughts, feelings, and concerns. Pt already utilizes therapy and medication management services. Pt has consistent housing and good supports. ? ? ?Mental Health Symptoms ?Depression:   ?Change in energy/activity; Difficulty Concentrating; Irritability; Hopelessness; Sleep (too much or little); Tearfulness; Worthlessness; Increase/decrease in appetite ?  ?Duration of Depressive symptoms: Duration of Depressive Symptoms: Greater than two weeks ?  ?Mania:   ?None ?  ?Anxiety:    ?Difficulty concentrating; Fatigue; Irritability;  Restlessness; Sleep; Tension; Worrying ?  ?Psychosis:   ?None ?  ?Duration of Psychotic symptoms:    ?Trauma:   ?None ?  ?Obsessions:   ?None ?  ?Compulsions:   ?None ?  ?Inattention:   ?None ?  ?Hyperactivit

## 2021-10-06 NOTE — ED Provider Notes (Signed)
Care assumed from Dr. Wallace Cullens, patient with anxiety pending TTS consultation.  Also, noted to be hyponatremic getting IV fluids. ? ?TTS consultation is appreciated.  Recommendation is for her to be held overnight and reevaluated by psychiatry in the morning. ?  ?Dione Booze, MD ?10/07/21 0014 ? ?

## 2021-10-06 NOTE — ED Provider Notes (Signed)
?Provider Note ?MRN:  [id number]  ?Arrival date & time: 10/08/21    ?ED Course and Medical Decision Making  ?Assumed care from DR A ALLEN at shift change. ? ?See not from prior team for complete details, in brief: 71 yo female with hx neuromusc disorder, some passive SI. TTS consult is pending. ? ?She was mild hyponatremic. Given 1L, rpt BMET is pending.  ? ?TTS to dispo.  ? ?Home meds re-ordered.  ? ?Patient has been stable for duration of shift.  Did require some Ativan due to muscle spasm, anxiousness.  She is not acutely psychotic.  Repeat metabolic panel is stable.  Sodium is improving.  Tolerating p.o. ? ?She is still pending TTS evaluation. ? ?HDS @ time of handoff ? ?Plan to incoming physician pending TTS consult. Dr Roxanne Mins ? ?Procedures ? ?Final Clinical Impressions(s) / ED Diagnoses  ? ?  ICD-10-CM   ?1. Hyponatremia  E87.1   ?  ?2. Suicidal ideation  R45.851   ?  ?3. Anxiety  F41.9   ?  ?  ?ED Discharge Orders   ? ? None  ? ?  ?  ? ? ?Discharge Instructions   ? ?  ?Schizophrenia/Mental Health Resources ?National Suicide Prevention Lifeline ?1-800-273-TALK 367 258 5161) ?http://www.webster.biz/ ??988?-Mental Health Crisis Line ? ?[place] ?https://sczaction.org/ ?[place] ?(972)737-9408 ?nimhinfo@nih .gov (e-mail) ?https://carter.com/ ?Schizophrenia & Psychosis Action Alliance ?(660)041-8484 ?info@sczaction .org ?https://sczaction.org/  ?5 Additional Healthline identified ?Best online Schizophrenia Support Groups? ?Students with Psychosis ? ?Schizophrenia Spectrum Support ? ?Supportiv ($30 monthly fee)  ? ?NAMI Connection Recovery Support Group ? ?Schizophrenia Alliance ? ?Local Resources: ?Outpatient:  ? ?[facility]:  ?([phone] ?[place], [place]  [address] ?  ? ? ?https://richard.com/ ? ? ?[place]  ?[facility] [place] ?[address] ?[place], [place] [address] ?[phone] ? ?[place]  ?[facility] ?[address] ?[place], [place]  [address] ?[phone] ?Local Resources ?(Inpatient) ? ?[place] ?[facility] ?[address]  ?[place], [place] [address] ?[phone] ? ?[facility] ?Behavioral Medicine Unit and Geriatric Psychiatric Unit   ?[address]  ?[place], [place] [address] ?[phone] ? ? ?[place] ?TastyShow.cz ? ?[place] ?https://namiguilford.org/ ? ?[employer] and Support Resources: ? ?Partners Ending Homelessness ?PainGain.tn ? ?[place] ?https://www.interactiveresourcecenter.org/ ? ?[employer] ?https://www.greensborourbanministry.org/ ? ?[facility] [employer] (adult [employer] shelter) ?www.ViralSquad.com.cy ?[address] ?[place], [place] [address] ?[phone] ?[facility] [employer] [facility] [place] ?[address] [place], [place] [address] ?[phone] ? ? ?[employer] ?([phone] VET [phone]) ? ?[employer] Companies ?([phone] press 1 ?Confidential chat-VeteransCrisisLine.net ?Or Text to (787)796-1928 ? ?[address] ?Call 211 or [phone] ?www.DealerOdds.hu ? ?Affordable Housing Resources in [place].com   ?([phone] ? ?Shelters ? ?[place] ?[phone] ?8:30am-5:30pm ?[place] [address] ?[place], [place]  [address] ?[phone] ?Female veterans 18+ with substance abuse issues ?Eligibility:  By Referral Only ? ?[place] ?[address] ?[place], [place] [address] ?[phone] Ext. 347 ?Adult Men & Women ?Eligibility: Valid ID & Social Security  Card ?www.greensborourbanministry.org ? ?[facility] - Vet Safety Net ?[place],   [address] ?[phone] ?Female veterans 18+ with substance abuse issues ?Eligibility:  By Referral Only ? ?[place] ?[address] ?  [place], [place]  [address] ?[phone] ?Single women 18+ without dependents ?Open 6pm-8am ?Eligibility:  Valid ID & Social Security Card ?Call to check availability  ?http://westendministries.org/leslieshouse.aspx ? ?[facility] [place] ?[address] ?[place], [place]  [address] ?[phone] ?Female veterans 18+ with substance abuse/mental ?health issues ?Eligibility:  By Referral Only ? ?[facility] ?[place] ?[place], [place] [address] ?[phone] ?Call to check availability ?Males 18+ ?Eligibility: Valid ID & Social Security Card ?www.odm-hp.org ? ?[facility] [facility] [employer] ?[address] ?[place], [place] [address] ?[phone] ?Women 18+ & Families with children ?Eligibility:  Valid ID & Criminal Background Check ?BattleCheck.dk ? ? ?[facility] [place] (Care Management): [phone] ?[facility]: [place] 24 Hour Phone Line [phone] ?[place] [phone] ?[place] [phone] ? ?2-1-1 Referral Service ?United Way 211 ?Call 211 or [phone] ?www.DealerOdds.hu ?A free [employer], 24/7 telephone information and referral service to help link citizens who are seeking help with the community resources they need. In [place], [place], [place] and [place] counties, just dial 211 from your phone. ?Mental Health Association in [place] ?Support groups for anxiety, depression and bipolar disorder, schizophrenia, family and friends, aftermath of suicide, and mental wellness for Latinos (in [place]). ?Mental Health Association in [place] ?Offers support groups, [employer] offers.  Psychological, vocational, educational and other rehabilitation services to those who suffer from mental health illness of the 46 and up (5 days a week/5hours a day), offer out patient services like diagnostic evaluation, comprehensive clinical assessments, individual counseling, group therapy, psycho-educational workshops, referral to other specialists, referral to a psychiatrist for an evaluation for medication, consultation, and outreach/training. ?ADS (Alcohol and Drug Services) ?(336) J6773102 [phone] ?Substance Abuse education, prevention and treatment (detox, assessments, intensive outpatient and inpatient counseling and programs). ?[employer] ?[place] ([phone], [place] (336) T763424 ?Support groups for posttraumatic stress, depression, and schizophrenia? as well as day programs for individuals with severe mental illness. ?[place] ([phone] ?[facility]-FREE-[phone] ?[employer] [phone] or www.kellinfoundation.org ?Telehealth platform, Individual counseling across the lifespan for both mental health and substance use, support groups, advocacy, case management, virtual villages, resource coordination. ?[facility]([phone] ?[place]-FREE-[phone] or [phone] ?[phone]. Provides mental health services to all residents regardless of ability to pay. [address], [place]. 24 ?Domestic Violence Crisis Line ?Family Service of the [place] ?Call for shelter and/or safety planning ?[place] ?(712)344-4067 (24/7) ?[facility] ?705-689-0347 (24/7) ? ?[place] ?[phone] ? ?The [employer] Companies ?856-585-9353 press 1 ?Confidential chat-VeteransCrisisLine.net ?Or Text to 5612426252 ? ?[facility] ?[address] ?[place],  [address] ?[phone] ?Hours: Mon-Fri. 8am-5pm ?[facility] Mobile Crisis Management ?Mobile crisis response for mental health, substance  abuse or intellectual/developmental disabilities ?718-833-4004 ? ? ? ? ?Disclaimer: This resource list is subject to change at any time and is a starting point for resource identification as of 07/10/2021.  ? ? ? ? ? ? ? ?  ?Pearline Cables, Geanie Berlin

## 2021-10-06 NOTE — ED Provider Notes (Signed)
?[facility]PT ?Provider Note ? ? ?CSN: [id number] ?Arrival date & time: 10/06/21  0957 ? ?  ? ?History ? ?Chief Complaint  ?Patient presents with  ? Anxiety  ? ? ?Colleen [name] is a 71 y.o. female. ? ?71 year old female presents with worse anxiety.  Patient states that she has been taking Lexapro which is helped her symptoms minimally.  She is also taking Xanax 0.5 mg 3 times daily.  Patient is very frustrated and is unsure of what has caused anxiety to increase.  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  Denies any active plan of suicidality.  Called her mental health provider as scheduled to see them in the near future. ? ? ?  ? ?Home Medications ?Prior to Admission medications   ?Medication Sig Start Date End Date Taking? Authorizing Provider  ?acetaminophen (TYLENOL) 500 MG tablet Take 500-1,000 mg by mouth every 8 (eight) hours as needed for mild pain or headache.     [provider]  ?ALPRAZolam Prudy Feeler) 0.5 MG tablet Take 0.5 mg by mouth 3 (three) times daily.    [provider]  ?Cholecalciferol (VITAMIN D-3) 125 MCG (5000 UT) TABS Take 5,000 Units by mouth daily.    [provider]  ?escitalopram (LEXAPRO) 10 MG tablet Take 0.5 tablets (5 mg total) by mouth daily. 09/07/21 09/07/22  Ardis Hughs, NP  ?levothyroxine (SYNTHROID) 112 MCG tablet Take 112 mcg by mouth See admin instructions. once daily Wednesday's, Thursday's, Friday's, and Saturday's. 09/01/21   [provider]  ?levothyroxine (SYNTHROID, LEVOTHROID) 100 MCG tablet TAKE 1 TABLET DAILY ?Patient taking differently: Take 100 mcg by mouth See admin instructions. once daily every Sunday's, Monday's, Tuesday's 08/24/16   Roderick Pee, MD  ?losartan (COZAAR) 100 MG tablet Take 50 mg by mouth daily. 07/05/21   [provider]  ?Multiple Vitamin (MULTIVITAMIN) tablet Take 1 tablet by mouth daily.    [provider]  ?    ? ?Allergies    ?Amlodipine, Cyclobenzaprine hcl, Cymbalta [duloxetine hcl], Lamictal [lamotrigine], Sertraline, Trazodone, and Tape   ? ?Review of Systems   ?Review of Systems  ?All other systems reviewed and are negative. ? ?Physical Exam ?Updated Vital Signs ?BP (!) 176/129 (BP Location: Right Arm)   Pulse (!) 105   Temp 98.7 ?F (37.1 ?C)   Resp (!) 22   Ht 1.626 m (5\' 4" )   Wt 67.1 kg   SpO2 100%   BMI 25.40 kg/m?  ?Physical Exam ?Vitals and nursing note reviewed.  ?Constitutional:   ?   General: She is not in acute distress. ?   Appearance: Normal appearance. She is well-developed. She is not toxic-appearing.  ?HENT:  ?   Head: Normocephalic and atraumatic.  ?Eyes:  ?   General: Lids are normal.  ?   Conjunctiva/sclera: Conjunctivae normal.  ?   Pupils: Pupils are equal, round, and reactive to light.  ?Neck:  ?   Thyroid: No thyroid mass.  ?   Trachea: No tracheal deviation.  ?Cardiovascular:  ?   Rate and Rhythm: Normal rate and regular rhythm.  ?   Heart sounds: Normal heart sounds. No murmur heard. ?  No gallop.  ?Pulmonary:  ?   Effort: Pulmonary effort is normal. No respiratory distress.  ?   Breath sounds: Normal breath sounds. No stridor. No decreased breath sounds, wheezing, rhonchi or rales.  ?Abdominal:  ?   General: There is no distension.  ?  Palpations: Abdomen is soft.  ?   Tenderness: There is no abdominal tenderness. There is no rebound.  ?Musculoskeletal:     ?   General: No tenderness. Normal range of motion.  ?   Cervical back: Normal range of motion and neck supple.  ?Skin: ?   General: Skin is warm and dry.  ?   Findings: No abrasion or rash.  ?Neurological:  ?   General: No focal deficit present.  ?   Mental Status: She is alert and oriented to person, place, and time. Mental status is at baseline.  ?   GCS: GCS eye subscore is 4. GCS verbal subscore is 5. GCS motor subscore is 6.  ?   Cranial Nerves: No cranial nerve deficit.  ?   Sensory: No sensory deficit.  ?   Motor: Tremor  present.  ?   Comments: Patient has resting baseline tremor which according to her is unchanged  ?Psychiatric:     ?   Attention and Perception: Attention normal.     ?   Mood and Affect: Mood is anxious.     ?   Speech: Speech is rapid and pressured.     ?   Behavior: Behavior is withdrawn.     ?   Thought Content: Thought content is not paranoid or delusional. Thought content does not include suicidal ideation. Thought content does not include suicidal plan.  ? ? ?ED Results / Procedures / Treatments   ?Labs ?(all labs ordered are listed, but only abnormal results are displayed) ?Labs Reviewed - No data to display ? ?EKG ?None ? ?Radiology ?No results found. ? ?Procedures ?Procedures  ? ? ?Medications Ordered in ED ?Medications  ?LORazepam (ATIVAN) injection 2 mg (has no administration in time range)  ? ? ?ED Course/ Medical Decision Making/ A&P ?  ?                        ?Medical Decision Making ?Amount and/or Complexity of Data Reviewed ?Labs: ordered. ? ?Risk ?OTC drugs. ?Prescription drug management. ? ? ?Patient presented here very anxious and tremulous.  She does have underlying neuromuscular disorder.  She stated that it is worse when she has anxiety.  Given Ativan 2 mg IM with no real improvement in her symptoms.  Was subsequently given 1 mg of IV Ativan she feels much better at this time.  Tremor is now gone.  Electrolytes were reviewed and significant for mild hyponatremia of 126.  She does have some ketones in the urine.  Patient given 1 L of saline here.  Spoke with patient again about if she feels suicidal.  She states that she is unsure.  Review of her record shows that she does have a history of suicidal ideations in the past.  She will require psychiatric assessment. ? ? ? ? ? ? ? ?Final Clinical Impression(s) / ED Diagnoses ?Final diagnoses:  ?None  ? ? ?Rx / DC Orders ?ED Discharge Orders   ? ? None  ? ?  ? ? ?  ?Lorre Nick, MD ?10/06/21 1233 ? ?

## 2021-10-06 NOTE — ED Notes (Signed)
Pt given microwaved dinner and water. ?

## 2021-10-06 NOTE — ED Notes (Signed)
Pt done with consult at this time. Pt then ambulated to the bathroom well. ?

## 2021-10-06 NOTE — ED Triage Notes (Signed)
Pt via pov from home with increased anxiety over last four days. Pt states she is on medications for anxiety and depression and they don't seem to be working. 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  ?

## 2021-10-06 NOTE — ED Notes (Addendum)
Behavorial health Consult currently occurring via video conference. ?

## 2021-10-06 NOTE — ED Notes (Signed)
Provided pt with peanut butter crackers and a water. ?

## 2021-10-06 NOTE — ED Notes (Signed)
Pt stated that she doesn't think that she can walk. A bedside commode was placed next to the pt's bed. Pt able to get up without any issues. A urine sample was collected. ?

## 2021-10-06 NOTE — ED Notes (Signed)
Staffing made aware we need a sitter. No sitter available at this time.  ?

## 2021-10-06 NOTE — ED Notes (Signed)
Provider wanted to hold off on the xanax for now. ?

## 2021-10-06 NOTE — ED Notes (Signed)
Pt stated, "I cannot feel like this! I want to die!" Informed [place] - RN. ?

## 2021-10-06 NOTE — ED Notes (Signed)
[facility] contacted regarding TTS Assessment. Voicemail left for Response. ?

## 2021-10-06 NOTE — ED Notes (Signed)
Rounded on pt// pt currently resting in bed with eyes close and 1:1 monitor continued ?

## 2021-10-07 DIAGNOSIS — R45851 Suicidal ideations: Secondary | ICD-10-CM | POA: Diagnosis not present

## 2021-10-07 DIAGNOSIS — F411 Generalized anxiety disorder: Secondary | ICD-10-CM | POA: Diagnosis not present

## 2021-10-07 LAB — BASIC METABOLIC PANEL
Anion gap: 7 (ref 5–15)
BUN: 6 mg/dL — ABNORMAL LOW (ref 8–23)
CO2: 23 mmol/L (ref 22–32)
Calcium: 7.9 mg/dL — ABNORMAL LOW (ref 8.9–10.3)
Chloride: 103 mmol/L (ref 98–111)
Creatinine, Ser: 0.77 mg/dL (ref 0.44–1.00)
GFR, Estimated: >60 mL/min (ref >60)
Glucose, Bld: 98 mg/dL (ref 70–99)
Potassium: 3.9 mmol/L (ref 3.5–5.1)
Sodium: 133 mmol/L — ABNORMAL LOW (ref 135–145)

## 2021-10-07 MED ORDER — CARBAMAZEPINE 100 MG PO CHEW
50.0000 mg | CHEWABLE_TABLET | Freq: Every evening | ORAL | Status: DC
Start: 2021-10-07 — End: 2021-10-07
  Filled 2021-10-07: qty 0.5

## 2021-10-07 MED ORDER — BENZONATATE 100 MG PO CAPS
100.0000 mg | ORAL_CAPSULE | Freq: Three times a day (TID) | ORAL | Status: DC | PRN
  Administered 2021-10-07: 100 mg via ORAL
  Filled 2021-10-07: qty 1

## 2021-10-07 MED ORDER — IPRATROPIUM-ALBUTEROL 0.5-2.5 (3) MG/3ML IN SOLN: 3.0000 mL | RESPIRATORY_TRACT | Status: DC | PRN

## 2021-10-07 MED ORDER — SODIUM CHLORIDE 0.9 % IV BOLUS
1000.0000 mL | Freq: Once | INTRAVENOUS | Status: AC
Start: 2021-10-07 — End: 2021-10-07
  Administered 2021-10-07: 1000 mL via INTRAVENOUS

## 2021-10-07 NOTE — Consult Note (Addendum)
Telepsych Consultation  ? ?Reason for Consult:  Suicidal Ideation  ?Referring Physician:  EDP ?Location of Patient: [id number] ?Location of Provider: [facility] ? ?Patient Identification: Colleen [name] ?MRN:  [id number] ?Principal Diagnosis: Suicidal ideation ?Diagnosis:  Principal Problem: ?  Suicidal ideation ?Active Problems: ?  Anxiety state ? ? ?Total Time spent with patient: 15 minutes ? ?Subjective:   ?Colleen [name] is a 71 y.o. female patient who [facility] ED with worsening anxiety and suicidal thoughts. 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 Pt seen today and evaluated. She is calm and cooperative on assessment, speech is clear & coherent, and thoughts are logical. Pt currently denies suicide thoughts, reports that she was frustrated yesterday when she made the statement, but states that she no longer feels that way. She reports her past diagnoses of MDD, anxiety & bipolar 1 d/o, reports a recent hospitalization at [facility] on March 1st 2023, reports that Hydroxyzine 25 mg & Latuda were started for anxiety & her mood respectively while she was there. She states that she sees a Publishing rights manager at [facility] who discontinued the [place] due to it being expensive, and started her on Tegretol. She reports that she is currently getting infusions to bring up her Sodium level. As per labs completed yesterday, her Sodium was 127. ? ?Pt verbalizes readiness for discharge, states that she has good support in her children and friend, and states that she feels safe to go back to her home. Pt provided verbal consent for this provider to call her friend Johnnye Lana) at [phone] for collateral information. Ms. Johnnye Lana called, and stated that patient is welcome to stay with her after discharge from the hospital.  ? ?NP spoke to patient about starting intensive outpatient  programming and/or partial hospitalization programming for additional outpatient resource support.  Patient was receptive to plan.  Support encouragement reassurance was provided. ?  ? ?HPI: Per initial admission assessment note: "Colleen [name] is a 71 year old patient who drove herself to [facility] due to ongoing difficulties managing her depression and anxiety, seemingly made worse by an increase in her medication last week. Pt states, "I've been dealing with accute depression and anxiety for about 4 1/2 weeks now."  ? ?Past Psychiatric History: Major depression, Biplor I? And Anxiety disorder ? ?Risk to Self:   ?Risk to Others:   ?Prior Inpatient Therapy:   ?Prior Outpatient Therapy:   ? ?Past Medical History:  ?Past Medical History:  ?Diagnosis Date  ? Allergy   ? Anxiety   ? Arrhythmia   ? "does not see cardiologist, sees pcp, Dr. Alonza Smoker  ? Asthma   ? DX IN MARCH 2012 "allergy induced asthma"  ? Bronchitis   ? hx of  ? Depression   ? "past hx of for Panic attacks, not on medication at this time"  ? Family history of anesthesia complication   ? mother "nausea and vomiting post surger"  ? GERD (gastroesophageal reflux disease)   ? Goiter 03/17/2012  ? 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 Heartburn   ? Herpes   ? on medication for outbreaks only  ? Hypertension   ? Seizures (HCC)   ? MEDICATION INDUCED  ? Urinary tract infection   ? hx of  ?  ?Past Surgical History:  ?Procedure Laterality Date  ? CESAREAN SECTION    ?  x 2  ? COLONOSCOPY    ? ELBOW SURGERY    ? right  ? KNEE ARTHROSCOPY    ? right  ? PILONIDAL CYST EXCISION    ? spinal cystectomy    ? "where pilonidal cyst was"  ? THYROIDECTOMY  04/07/2012  ? Procedure: THYROIDECTOMY;  Surgeon: Currie Paris, MD;  Location: [facility] OR;  Service: General;  Laterality: N/A;   ?  ? ?Family History:  ?Family History  ?Problem Relation Age of Onset  ? Heart failure Father   ? Hypertension Father   ?  Seizures Father   ? Heart disease Father   ? Cervical cancer Mother   ? Seizures Brother   ? Irritable bowel syndrome Brother   ? Cirrhosis Brother   ? Hypertension Brother   ?     x 4  ? Fibromyalgia Sister   ? Atrial fibrillation Sister   ? Prostate cancer Brother   ? Breast cancer Neg Hx   ? ?Family Psychiatric  History:  ?Social History:  ?Social History  ? ?Substance and Sexual Activity  ?Alcohol Use No  ? Alcohol/week: 0.0 standard drinks  ?   ?Social History  ? ?Substance and Sexual Activity  ?Drug Use No  ?  ?Social History  ? ?Socioeconomic History  ? Marital status: Divorced  ?  Spouse name: Not on file  ? Number of children: 2  ? Years of education: Not on file  ? Highest education level: Not on file  ?Occupational History  ? Occupation: Patient Care Customer Service  ?  Employer: [employer]  ?Tobacco Use  ? Smoking status: Former  ?  Types: Cigarettes  ?  Quit date: 05/01/1999  ?  Years since quitting: 22.4  ? Smokeless tobacco: Never  ?Substance and Sexual Activity  ? Alcohol use: No  ?  Alcohol/week: 0.0 standard drinks  ? Drug use: No  ? Sexual activity: Yes  ?  Partners: Male  ?Other Topics Concern  ? Not on file  ?Social History Narrative  ? Not on file  ? ?Social Determinants of Health  ? ?Financial Resource Strain: Not on file  ?Food Insecurity: Not on file  ?Transportation Needs: Not on file  ?Physical Activity: Not on file  ?Stress: Not on file  ?Social Connections: Not on file  ? ?Additional Social History: ?  ? ?Allergies:   ?Allergies  ?Allergen Reactions  ? Amlodipine Other (See Comments)  ?  Flushing, tingling in face, sore throat ? ?(Norvasc)  ? Cyclobenzaprine Hcl Other (See Comments)  ?  Seizures ?  ? Cymbalta [Duloxetine Hcl] Other (See Comments)  ?  "Made the depression worse"  ? Lamictal [Lamotrigine] Other (See Comments)  ?  Constipation and irritability (in the level/dose that actually might help)  ? Sertraline Diarrhea  ? Trazodone Other (See Comments)  ?  Nightmares ?  ? Tape Rash  ?   Blisters  ? ? ?Labs:  ?Results for orders placed or performed during the hospital encounter of 10/06/21 (from the past 48 hour(s))  ?Ethanol     Status: None  ? Collection Time: 10/06/21 10:14 AM  ?Result Value Ref Range  ? Alcohol, Ethyl (B) <10 <10 mg/dL  ?  Comment: (NOTE) ?Lowest detectable limit for serum alcohol is 10 mg/dL. ? ?For medical purposes only. ?Performed at [facility], [address] ?[place], [place] [address] ?  ?CBC with Differential/Platelet     Status: None  ? Collection Time: 10/06/21 10:14 AM  ?Result Value  Ref Range  ? WBC 6.7 4.0 - 10.5 K/uL  ? RBC 4.44 3.87 - 5.11 MIL/uL  ? Hemoglobin 14.3 12.0 - 15.0 g/dL  ? HCT 40.3 36.0 - 46.0 %  ? MCV 90.8 80.0 - 100.0 fL  ? MCH 32.2 26.0 - 34.0 pg  ? MCHC 35.5 30.0 - 36.0 g/dL  ? RDW 11.6 11.5 - 15.5 %  ? Platelets 343 150 - 400 K/uL  ? nRBC 0.0 0.0 - 0.2 %  ? Neutrophils Relative % 74 %  ? Neutro Abs 4.9 1.7 - 7.7 K/uL  ? Lymphocytes Relative 17 %  ? Lymphs Abs 1.1 0.7 - 4.0 K/uL  ? Monocytes Relative 7 %  ? Monocytes Absolute 0.5 0.1 - 1.0 K/uL  ? Eosinophils Relative 1 %  ? Eosinophils Absolute 0.1 0.0 - 0.5 K/uL  ? Basophils Relative 1 %  ? Basophils Absolute 0.0 0.0 - 0.1 K/uL  ? Immature Granulocytes 0 %  ? Abs Immature Granulocytes 0.02 0.00 - 0.07 K/uL  ?  Comment: Performed at [facility], [address], [place], [place] [address]  ?Comprehensive metabolic panel     Status: Abnormal  ? Collection Time: 10/06/21 10:14 AM  ?Result Value Ref Range  ? Sodium 126 (L) 135 - 145 mmol/L  ? Potassium 4.0 3.5 - 5.1 mmol/L  ? Chloride 89 (L) 98 - 111 mmol/L  ? CO2 26 22 - 32 mmol/L  ? Glucose, Bld 100 (H) 70 - 99 mg/dL  ?  Comment: Glucose reference range applies only to samples taken after fasting for at least 8 hours.  ? BUN 7 (L) 8 - 23 mg/dL  ? Creatinine, Ser 0.83 0.44 - 1.00 mg/dL  ? Calcium 9.9 8.9 - 10.3 mg/dL  ? Total Protein 7.9 6.5 - 8.1 g/dL  ? Albumin 4.8 3.5 - 5.0 g/dL  ? AST 18 15 - 41 U/L  ?  ALT 17 0 - 44 U/L  ? Alkaline Phosphatase 61 38 - 126 U/L  ? Total Bilirubin 0.6 0.3 - 1.2 mg/dL  ? GFR, Estimated >60 >60 mL/min  ?  Comment: (NOTE) ?Calculated using the CKD-EPI Creatinine Equation (2021

## 2021-10-07 NOTE — ED Notes (Signed)
MD made aware of pt coughing and requested med // pending new order  ?

## 2021-10-07 NOTE — ED Notes (Signed)
She is asleep in the presence of her sitter. I allow her to continue to rest at this time. I notify our pharmacist at this time, who inform me that they will arrange courier delivery of her Tegretol and Synthroid.  ?

## 2021-10-07 NOTE — ED Provider Notes (Addendum)
Emergency Medicine Observation Re-evaluation Note ? ?Colleen [name] is a 71 y.o. female, seen on rounds today.  Pt initially presented to the ED for complaints of Anxiety, Depression, and Suicidal ?Currently, the patient is alert, resting. ? ?Physical Exam  ?BP (!) 142/88 (BP Location: Left Arm)   Pulse 72   Temp 98.3 ?F (36.8 ?C) (Oral)   Resp 16   Ht 5\' 4"  (1.626 m)   Wt 67.1 kg   SpO2 98%   BMI 25.40 kg/m?  ?Physical Exam ?General: NAD ?Cardiac: well perfused ?Lungs: even and unlabored ?Psych: no agitation ? ?ED Course / MDM  ?EKG:  ? ?I have reviewed the labs performed to date as well as medications administered while in observation.  Recent changes in the last 24 hours include pt initially evaluated by psychiatry overnight, recommending re-eval this am. Home meds are ordered. ? ?Reviewed labs from yesterday: She is hyponatremic to 127, improved from 126 earlier in the day yesterday.  Status post 1 L LR.  ? ?Will recheck BMP today. ? ?1030AM ?Patient psych cleared. ? ?1137AM ?Repeat sodium trending up at 133.  Overall stable for discharge at this time. ? ?Plan  ?Current plan is for Re-eval by TTS this AM. ? Calena Salem is not under involuntary commitment. ? ? ?  ? ?  ?[name], MD ?10/07/21 1137 ? ?

## 2021-10-07 NOTE — Discharge Instructions (Signed)
Schizophrenia/Mental Health Resources ?National Suicide Prevention Lifeline ?1-800-273-TALK (8255) ?http://www.suicidepreventionlifeline.org/ ??988?-Mental Health Crisis Line ? ?National Schizophrenia Foundation ?https://sczaction.org/ ?National Institute of Mental Health ?1-866-615-6464 ?nimhinfo@nih.gov (e-mail) ?www.nimh.nih.gov ?Schizophrenia & Psychosis Action Alliance ?800-493-2094 ?info@sczaction.org ?https://sczaction.org/  ?5 Additional Healthline identified ?Best online Schizophrenia Support Groups? ?Students with Psychosis ? ?Schizophrenia Spectrum Support ? ?Supportiv ($30 monthly fee)  ? ?NAMI Connection Recovery Support Group ? ?Schizophrenia Alliance ? ?Local Resources: ?Outpatient:  ? ?[facility]:  ?[phone] ?[address]   [place], [place]  [address] ?  ? ? ?https://www.[place].gov/services/[place]-[facility]-health-centers#contact ? ? ?[place]  ?[facility] [place] ?[address] ?[place], [place] [address] ?[phone] ? ?[place]  ?[facility] [place] ?[address] ?[place], [place]  [address] ?[phone] ?Local Resources ?(Inpatient) ? ?[place] [facility] ?[address]  ?[place], [place] [address] ?[phone] ? ?[facility] ?Behavioral Medicine Unit and Geriatric Psychiatric Unit   ?[address]  ?[place], [place] [address] ?[phone] ? ? ?[facility] ?https://naminwpiedmontnc.org/support-and-education/mental-health-education/ ? ?NAMI -[place] ?https://namiguilford.org/ ? ?Community Housing and Support Resources: ? ?[facility]eless[facility] ?https://pehgc.org/ ? ?Interactive Resource Center ?https://www.interactiveresourcecenter.org/ ? ?[facility] ?https://www.greensborourbanministry.org/ ? ?[facility] [place] (adult men's shelter) ?www.opendoorministrieshp.org ?[address] ?[place], [place] [address] ?[phone] ?[facility] [place] [facility] ?[address] [place], [place] [address] ?[phone] ? ? ?VA's National Homeless Call Center ?1-877-4AID VET (1-877-424-3838) ? ?Veterans Crisis Line ?1-800-273-8255 press 1 ?Confidential chat-VeteransCrisisLine.net ?Or Text to 838255 ? ?United Way ?Call 211 or 1-888-892-1162 ?www.NC211.org ? ?Affordable Housing Resources in [place] ?nchousingsearch.com   ?1-877-428-8844 ? ?Shelters ? ?[facility] Housing Hotline ?336-691-9521 ?8:30am-5:30pm ?[facility] ?[address] ?[place], [place]  [address] ?[phone] ?Female veterans 18+ with substance abuse issues ?Eligibility:  By Referral Only ? ?[facility] ?[address] ?[place], [place] [address] ?[phone] Ext. 347 ?Adult Men & Women ?Eligibility: Valid ID & Social Security Card ?www.greensborourbanministry.org ? ?[facility] ?[address] ?[place], [place]  [address] ?[phone] ?Female veterans 18+ with substance abuse issues ?Eligibility:  By Referral Only ? ?[facility] ?[address] ?[place], [place]  [address] ?[phone] ?Single women 18+ without dependents ?Open 6pm-8am ?Eligibility:  Valid ID & Social Security Card ?Call to check availability  ?http://westendministries.org/leslieshouse.aspx ? ?[facility] ?[address] ?[place], [place]  [address] ?[phone] ?Female veterans 18+ with substance abuse/mental ?health issues ?Eligibility:  By Referral Only ? ?[facility] ?[address] ?[place], [place] [address] ?[phone] ?Call to check availability ?Males 18+ ?Eligibility: Valid ID & Social Security Card ?www.odm-hp.org ? ?[facility] [place] ?[address] ?[place], [place] [address] ?[phone] ?Women 18+ & Families with children ?Eligibility:  Valid ID  & Criminal Background Check ?www.[facility].org/commands/[place] ? ? ?[facility] (Care Management): [phone] ?[facility] [place] [facility]: Behavioral Health 24 Hour Phone Line 1-800-853-5163 ?NAMI Hotline 336-370-4264 ?NAMI [place] 919-788-0801 ? ?2-1-1 Referral Service ?United Way 211 ?Call 211 or 1-888-892-1162 ?www.NC211.org ?A free United Way, 24/7 telephone information and referral service to help link citizens who are seeking help with the community resources they need. In [place] and [place], just dial 211 from your phone. ?Mental Health Association in [place] 336-373-1402 ?Support groups for anxiety, depression and bipolar disorder, schizophrenia,   family and friends, aftermath of suicide, and mental wellness for Latinos (in Spanish). ?[facility] [place] 336-883-7480 ?Offers support groups, [facility] program offers. Psychological, vocational, educational and other rehabilitation services to those who suffer from mental health illness of the 18 and up (5 days a week/5hours a day), offer out patient services like diagnostic evaluation, comprehensive clinical assessments, individual counseling, group therapy, psycho-educational workshops, referral to other specialists, referral to a psychiatrist for an evaluation for medication, consultation, and outreach/training. ?[facility] ([facility]) ?([phone] 336-333-6860 ?Substance Abuse education, prevention and treatment (detox, assessments, intensive outpatient and inpatient counseling and programs). ?[facility] ?[place] ([phone] [phone], [place] ([phone] ?Support groups for posttraumatic stress, depression, and schizophrenia? as well as day programs for individuals with severe mental illness. ?[facility] [place] ([phone] ?[facility]-FREE-336-275-7896 ?[facility] 336-429-5600 or www.kellinfoundation.org ?Telehealth platform, Individual counseling across  the lifespan for both mental health and substance use, support groups, advocacy, case management, virtual villages, resource coordination. ?[facility]- 1-800-256-2452 ?[facility]-FREE-336-676-6840 or 1-800-853-5163 ?336-676-6849. Provides mental health services to all residents regardless of ability to pay. [address], [place]. [place]. 24 ?Domestic Violence Crisis Line ?[facility] ?Call for shelter and/or safety planning ?[facility] ?336-889-7273 (24/7) ?[facility] ?336-273-7273 (24/7) ? ?VA Homeless Hotline ?877-424-3838 ? ?Veterans Crisis Line ?1-800-273-8255 press 1 ?Confidential chat-VeteransCrisisLine.net ?Or Text to 838255 ? ?[facility] [place] [facility] ?[address] ?[place], [place] [address] ?[phone] ?Hours: Mon-Fri. 8am-5pm ?[facility] Mobile Crisis Management ?Mobile crisis response for mental health, substance abuse or intellectual/developmental disabilities ?1-877-626-1772 ? ? ? ? ?Disclaimer: This resource list is subject to change at any time and is a starting point for resource identification as of 07/10/2021.  ? ?

## 2021-10-08 ENCOUNTER — Emergency Department (HOSPITAL_BASED_OUTPATIENT_CLINIC_OR_DEPARTMENT_OTHER)
Admission: EM | Admit: 2021-10-08 | Discharge: 2021-10-09 | Disposition: A | Discharge status: Other Acute Inpatient Hospital | Payer: Medicare Other | Attending: Emergency Medicine | Admitting: Emergency Medicine

## 2021-10-08 ENCOUNTER — Encounter (HOSPITAL_BASED_OUTPATIENT_CLINIC_OR_DEPARTMENT_OTHER): Payer: Self-pay | Admitting: Emergency Medicine

## 2021-10-08 ENCOUNTER — Other Ambulatory Visit: Payer: Self-pay

## 2021-10-08 DIAGNOSIS — F1310 Sedative, hypnotic or anxiolytic abuse, uncomplicated: Secondary | ICD-10-CM | POA: Insufficient documentation

## 2021-10-08 DIAGNOSIS — Z20822 Contact with and (suspected) exposure to covid-19: Secondary | ICD-10-CM | POA: Diagnosis not present

## 2021-10-08 DIAGNOSIS — R45851 Suicidal ideations: Secondary | ICD-10-CM | POA: Diagnosis not present

## 2021-10-08 DIAGNOSIS — R251 Tremor, unspecified: Secondary | ICD-10-CM | POA: Insufficient documentation

## 2021-10-08 DIAGNOSIS — I10 Essential (primary) hypertension: Secondary | ICD-10-CM | POA: Insufficient documentation

## 2021-10-08 DIAGNOSIS — F332 Major depressive disorder, recurrent severe without psychotic features: Secondary | ICD-10-CM | POA: Insufficient documentation

## 2021-10-08 DIAGNOSIS — E871 Hypo-osmolality and hyponatremia: Secondary | ICD-10-CM | POA: Insufficient documentation

## 2021-10-08 DIAGNOSIS — F411 Generalized anxiety disorder: Secondary | ICD-10-CM | POA: Insufficient documentation

## 2021-10-08 LAB — COMPREHENSIVE METABOLIC PANEL
ALT: 17 U/L (ref 0–44)
AST: 16 U/L (ref 15–41)
Albumin: 4.3 g/dL (ref 3.5–5.0)
Alkaline Phosphatase: 62 U/L (ref 38–126)
Anion gap: 8 (ref 5–15)
BUN: 7 mg/dL — ABNORMAL LOW (ref 8–23)
CO2: 27 mmol/L (ref 22–32)
Calcium: 9.3 mg/dL (ref 8.9–10.3)
Chloride: 98 mmol/L (ref 98–111)
Creatinine, Ser: 0.83 mg/dL (ref 0.44–1.00)
GFR, Estimated: >60 mL/min (ref >60)
Glucose, Bld: 96 mg/dL (ref 70–99)
Potassium: 4.4 mmol/L (ref 3.5–5.1)
Sodium: 133 mmol/L — ABNORMAL LOW (ref 135–145)
Total Bilirubin: 0.3 mg/dL (ref 0.3–1.2)
Total Protein: 7 g/dL (ref 6.5–8.1)

## 2021-10-08 LAB — URINALYSIS, ROUTINE W REFLEX MICROSCOPIC
Bilirubin Urine: NEGATIVE
Glucose, UA: NEGATIVE mg/dL
Hgb urine dipstick: NEGATIVE
Ketones, ur: NEGATIVE mg/dL
Leukocytes,Ua: NEGATIVE
Nitrite: NEGATIVE
Protein, ur: NEGATIVE mg/dL
Specific Gravity, Urine: <1.005 — ABNORMAL LOW (ref 1.005–1.030)
pH: 7 (ref 5.0–8.0)

## 2021-10-08 LAB — CBC WITH DIFFERENTIAL/PLATELET
Abs Immature Granulocytes: 0.01 10*3/uL (ref 0.00–0.07)
Basophils Absolute: 0 10*3/uL (ref 0.0–0.1)
Basophils Relative: 1 %
Eosinophils Absolute: 0.1 10*3/uL (ref 0.0–0.5)
Eosinophils Relative: 1 %
HCT: 34.6 % — ABNORMAL LOW (ref 36.0–46.0)
Hemoglobin: 12.1 g/dL (ref 12.0–15.0)
Immature Granulocytes: 0 %
Lymphocytes Relative: 16 %
Lymphs Abs: 0.8 10*3/uL (ref 0.7–4.0)
MCH: 32 pg (ref 26.0–34.0)
MCHC: 35 g/dL (ref 30.0–36.0)
MCV: 91.5 fL (ref 80.0–100.0)
Monocytes Absolute: 0.5 10*3/uL (ref 0.1–1.0)
Monocytes Relative: 11 %
Neutro Abs: 3.7 10*3/uL (ref 1.7–7.7)
Neutrophils Relative %: 71 %
Platelets: 265 10*3/uL (ref 150–400)
RBC: 3.78 MIL/uL — ABNORMAL LOW (ref 3.87–5.11)
RDW: 11.6 % (ref 11.5–15.5)
WBC: 5.2 10*3/uL (ref 4.0–10.5)
nRBC: 0 % (ref 0.0–0.2)

## 2021-10-08 LAB — ACETAMINOPHEN LEVEL: Acetaminophen (Tylenol), Serum: <10 ug/mL — ABNORMAL LOW (ref 10–30)

## 2021-10-08 LAB — RAPID URINE DRUG SCREEN, HOSP PERFORMED
Amphetamines: NOT DETECTED
Barbiturates: NOT DETECTED
Benzodiazepines: POSITIVE — AB
Cocaine: NOT DETECTED
Opiates: NOT DETECTED
Tetrahydrocannabinol: NOT DETECTED

## 2021-10-08 LAB — ETHANOL: Alcohol, Ethyl (B): <10 mg/dL (ref <10)

## 2021-10-08 MED ORDER — ALPRAZOLAM 0.5 MG PO TABS
0.5000 mg | ORAL_TABLET | Freq: Two times a day (BID) | ORAL | Status: DC | PRN
  Administered 2021-10-08 – 2021-10-09 (×3): 0.5 mg via ORAL
  Filled 2021-10-08 (×4): qty 1

## 2021-10-08 MED ORDER — LORAZEPAM 2 MG/ML IJ SOLN
2.0000 mg | Freq: Once | INTRAMUSCULAR | Status: AC
  Administered 2021-10-08: 2 mg via INTRAMUSCULAR
  Filled 2021-10-08: qty 1

## 2021-10-08 MED ORDER — CARBAMAZEPINE 100 MG PO CHEW
50.0000 mg | CHEWABLE_TABLET | Freq: Every day | ORAL | Status: DC
Start: 2021-10-08 — End: 2021-10-09

## 2021-10-08 MED ORDER — HYDROXYZINE HCL 25 MG PO TABS
25.0000 mg | ORAL_TABLET | Freq: Every evening | ORAL | Status: DC | PRN
  Administered 2021-10-08 – 2021-10-09 (×2): 25 mg via ORAL
  Filled 2021-10-08 (×2): qty 1

## 2021-10-08 NOTE — ED Notes (Signed)
Pt stated that she is having "spasms". Pt is asking for more Ativan. Informed Butch Penny - RN. ?

## 2021-10-08 NOTE — ED Notes (Signed)
Pt asking if MD will possibly give pt more Ativan. Informed Lupita Leash - RN. ?

## 2021-10-08 NOTE — ED Notes (Signed)
Pt given crackers and Gatorade, per Lupita Leash - RN. ?

## 2021-10-08 NOTE — ED Triage Notes (Signed)
Pt arrives to ED with c/o suicidal ideation. She has a plan to overdose on pills. Pt was seen here this week, did not meet psychiatric inpatient admission.  ?

## 2021-10-08 NOTE — ED Provider Notes (Signed)
?[facility] EM[facility]PT ?Provider Note ? ? ?CSN: [id number] ?Arrival date & time: 10/08/21  1028 ? ?  ? ?History ? ?Chief Complaint  ?Patient presents with  ? Suicidal  ? ? ?Colleen [name] is a 71 y.o. female. ? ?Patient seen earlier this week just released by behavioral health from the ED yesterday.  Back for suicidal ideation.  Patient states he is planning to overdose on pills.  Patient denies taking anything at this time.  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  And gets worse when she has anxiety.  Patient's medications are significant for Xanax 0.5 mg twice a day Tegretol 50 mg at dinnertime Lexapro 5 mg daily.  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 ? ? ?  ? ?Home Medications ?Prior to Admission medications   ?Medication Sig Start Date End Date Taking? Authorizing Provider  ?acetaminophen (TYLENOL) 500 MG tablet Take 500-1,000 mg by mouth every 8 (eight) hours as needed for mild pain or headache.     [provider]  ?ALPRAZolam Prudy Feeler(XANAX) 0.5 MG tablet Take 0.5 mg by mouth 3 (three) times daily.    [provider]  ?carbamazepine (TEGRETOL) 200 MG tablet Take 100 mg by mouth daily. 09/27/21   [provider]  ?Cholecalciferol (VITAMIN D-3) 125 MCG (5000 UT) TABS Take 5,000 Units by mouth daily.    [provider]  ?escitalopram (LEXAPRO) 10 MG tablet Take 0.5 tablets (5 mg total) by mouth daily. 09/07/21 09/07/22  Ardis Hughsoleman, Carolyn H, NP  ?hydrOXYzine (VISTARIL) 25 MG capsule Take 25 mg by mouth 2 (two) times daily. 09/26/21   [provider]  ?levothyroxine (SYNTHROID) 112 MCG tablet Take 112 mcg by mouth See admin instructions. 112mcg once daily Wednesday's, Thursday's, Friday's, and Saturday's. 09/01/21   [provider]  ?levothyroxine (SYNTHROID, LEVOTHROID) 100 MCG tablet  TAKE 1 TABLET DAILY ?Patient taking differently: Take 100 mcg by mouth See admin instructions. 100mcg once daily every Sunday's, Monday's, Tuesday's 08/24/16   Roderick Peeodd, Jeffrey A, MD  ?LORazepam (ATIVAN) 1 MG tablet Take 1 mg by mouth 2 (two) times daily. 09/15/21   [provider]  ?losartan (COZAAR) 100 MG tablet Take 50 mg by mouth daily. 07/05/21   [provider]  ?Multiple Vitamin (MULTIVITAMIN) tablet Take 1 tablet by mouth daily.    [provider]  ?   ? ?Allergies    ?Amlodipine, Aripiprazole, Cyclobenzaprine hcl, Cymbalta [duloxetine hcl], Lamictal [lamotrigine], Other, Sertraline, Trazodone, and Tape   ? ?Review of Systems   ?Review of Systems  ?Constitutional:  Negative for chills and fever.  ?HENT:  Negative for ear pain and sore throat.   ?Eyes:  Negative for pain and visual disturbance.  ?Respiratory:  Negative for cough and shortness of breath.   ?Cardiovascular:  Negative for chest pain and palpitations.  ?Gastrointestinal:  Negative for abdominal pain and vomiting.  ?Genitourinary:  Negative for dysuria and hematuria.  ?Musculoskeletal:  Negative for arthralgias and back pain.  ?Skin:  Negative for color change and rash.  ?Neurological:  Positive for tremors. Negative for seizures and syncope.  ?Psychiatric/Behavioral:  Positive for suicidal ideas. The patient is nervous/anxious.   ?All other systems reviewed and are negative. ? ?Physical Exam ?Updated Vital Signs ?BP 132/89 (BP Location: Left Arm)   Pulse 71   Temp 97.9 ?F (36.6 ?C) (Oral)   Resp 20  Ht 1.626 m (5\' 4" )   Wt 67.1 kg   SpO2 100%   BMI 25.40 kg/m?  ?Physical Exam ?Vitals and nursing note reviewed.  ?Constitutional:   ?   General: She is not in acute distress. ?   Appearance: Normal appearance. She is well-developed. She is ill-appearing.  ?HENT:  ?   Head: Normocephalic and atraumatic.  ?   Mouth/Throat:  ?   Mouth: Mucous membranes are moist.  ?Eyes:  ?   Extraocular Movements: Extraocular movements  intact.  ?   Conjunctiva/sclera: Conjunctivae normal.  ?   Pupils: Pupils are equal, round, and reactive to light.  ?Cardiovascular:  ?   Rate and Rhythm: Normal rate and regular rhythm.  ?   Heart sounds: No murmur heard. ?Pulmonary:  ?   Effort: Pulmonary effort is normal. No respiratory distress.  ?   Breath sounds: Normal breath sounds.  ?Abdominal:  ?   Palpations: Abdomen is soft.  ?   Tenderness: There is no abdominal tenderness.  ?Musculoskeletal:     ?   General: No swelling.  ?   Cervical back: Normal range of motion and neck supple.  ?Skin: ?   General: Skin is warm and dry.  ?   Capillary Refill: Capillary refill takes less than 2 seconds.  ?Neurological:  ?   Mental Status: She is alert. Mental status is at baseline.  ?   Comments: Patient with tremors.  ?Psychiatric:     ?   Mood and Affect: Mood normal.  ? ? ?ED Results / Procedures / Treatments   ?Labs ?(all labs ordered are listed, but only abnormal results are displayed) ?Labs Reviewed  ?RAPID URINE DRUG SCREEN, HOSP PERFORMED - Abnormal; Notable for the following components:  ?    Result Value  ? Benzodiazepines POSITIVE (*)   ? All other components within normal limits  ?CBC WITH DIFFERENTIAL/PLATELET - Abnormal; Notable for the following components:  ? RBC 3.78 (*)   ? HCT 34.6 (*)   ? All other components within normal limits  ?COMPREHENSIVE METABOLIC PANEL - Abnormal; Notable for the following components:  ? Sodium 133 (*)   ? BUN 7 (*)   ? All other components within normal limits  ?URINALYSIS, ROUTINE W REFLEX MICROSCOPIC - Abnormal; Notable for the following components:  ? Color, Urine COLORLESS (*)   ? Specific Gravity, Urine <1.005 (*)   ? All other components within normal limits  ?ACETAMINOPHEN LEVEL - Abnormal; Notable for the following components:  ? Acetaminophen (Tylenol), Serum <10 (*)   ? All other components within normal limits  ?ETHANOL  ? ? ?EKG ?EKG Interpretation ? ?Date/Time:  Thursday October 08 2021 12:16:25  EDT ?Ventricular Rate:  78 ?PR Interval:  148 ?QRS Duration: 78 ?QT Interval:  376 ?QTC Calculation: 428 ?R Axis:   45 ?Text Interpretation: Normal sinus rhythm Normal ECG When compared with ECG of 10-Sep-2021 12:13, Vent. rate has increased BY  32 BPM QRS duration has decreased Confirmed by 12-Sep-2021 [phone]) on 10/08/2021 12:30:47 PM ? ?Radiology ?No results found. ? ?Procedures ?Procedures  ? ? ?Medications Ordered in ED ?Medications  ?LORazepam (ATIVAN) injection 2 mg (2 mg Intramuscular Given 10/08/21 1238)  ? ? ?ED Course/ Medical Decision Making/ A&P ?  ?                        ?Medical Decision Making ?Amount and/or Complexity of Data Reviewed ?Labs: ordered. ? ?Risk ?Prescription drug management. ? ? ?  Patient representing with suicidal ideation says she is going to overdose to kill herself.  Patient just was released yesterday from behavioral health evaluation from the ED.  They felt she did not meet admission criteria.  Patient denies overdosing on anything. ? ?Patient's labs here drug screen positive for benzo she is on benzos regular basis.  She has a history of muscle spasms and tremors and anxiety.  Patient's urinalysis negative alcohol less than 10 complete metabolic panel without any significant abnormalities other than sodium being down a little bit at 33.  Tylenol level less than 10 CBC white count of 5.2 no leukocytosis hemoglobin 12.1. ? ?Patient given some IV and Ativan to help with the muscle spasm and the tremors.  That has helped. ? ?Patient is medically cleared for behavioral health evaluation. ? ? ? ?Final Clinical Impression(s) / ED Diagnoses ?Final diagnoses:  ?Suicidal ideation  ? ? ?Rx / DC Orders ?ED Discharge Orders   ? ? None  ? ?  ? ? ?  ?Vanetta Mulders, MD ?10/08/21 1410 ? ?

## 2021-10-08 NOTE — ED Notes (Signed)
Patient wanded by security at this time. Patient given some tissues also. Patient is tearful.  ?

## 2021-10-08 NOTE — ED Notes (Signed)
Called [facility] to check on TTS status--was told Colleen [name] is by herself tonight...may be awhile  ?

## 2021-10-09 ENCOUNTER — Other Ambulatory Visit: Payer: Self-pay

## 2021-10-09 ENCOUNTER — Encounter: Payer: Self-pay | Admitting: Psychiatry

## 2021-10-09 ENCOUNTER — Inpatient Hospital Stay
Admission: AD | Admit: 2021-10-09 | Discharge: 2021-10-10 | DRG: 885 | Disposition: A | Discharge status: Home / Self Care | Payer: Medicare Other | Source: Intra-hospital | Attending: Psychiatry | Admitting: Psychiatry

## 2021-10-09 DIAGNOSIS — G40909 Epilepsy, unspecified, not intractable, without status epilepticus: Secondary | ICD-10-CM | POA: Diagnosis present

## 2021-10-09 DIAGNOSIS — R41843 Psychomotor deficit: Secondary | ICD-10-CM | POA: Diagnosis present

## 2021-10-09 DIAGNOSIS — F332 Major depressive disorder, recurrent severe without psychotic features: Principal | ICD-10-CM | POA: Diagnosis present

## 2021-10-09 DIAGNOSIS — R55 Syncope and collapse: Secondary | ICD-10-CM | POA: Diagnosis present

## 2021-10-09 DIAGNOSIS — Z91048 Other nonmedicinal substance allergy status: Secondary | ICD-10-CM | POA: Diagnosis not present

## 2021-10-09 DIAGNOSIS — I959 Hypotension, unspecified: Secondary | ICD-10-CM | POA: Diagnosis present

## 2021-10-09 DIAGNOSIS — F411 Generalized anxiety disorder: Secondary | ICD-10-CM | POA: Diagnosis present

## 2021-10-09 DIAGNOSIS — I10 Essential (primary) hypertension: Secondary | ICD-10-CM | POA: Diagnosis present

## 2021-10-09 DIAGNOSIS — G4700 Insomnia, unspecified: Secondary | ICD-10-CM | POA: Diagnosis present

## 2021-10-09 DIAGNOSIS — Z8249 Family history of ischemic heart disease and other diseases of the circulatory system: Secondary | ICD-10-CM | POA: Diagnosis not present

## 2021-10-09 DIAGNOSIS — Z888 Allergy status to other drugs, medicaments and biological substances status: Secondary | ICD-10-CM

## 2021-10-09 DIAGNOSIS — Z79899 Other long term (current) drug therapy: Secondary | ICD-10-CM | POA: Diagnosis not present

## 2021-10-09 DIAGNOSIS — E871 Hypo-osmolality and hyponatremia: Secondary | ICD-10-CM | POA: Diagnosis not present

## 2021-10-09 DIAGNOSIS — K219 Gastro-esophageal reflux disease without esophagitis: Secondary | ICD-10-CM | POA: Diagnosis present

## 2021-10-09 DIAGNOSIS — Z20822 Contact with and (suspected) exposure to covid-19: Secondary | ICD-10-CM | POA: Diagnosis present

## 2021-10-09 DIAGNOSIS — Z79890 Hormone replacement therapy (postmenopausal): Secondary | ICD-10-CM | POA: Diagnosis not present

## 2021-10-09 DIAGNOSIS — R569 Unspecified convulsions: Secondary | ICD-10-CM | POA: Diagnosis present

## 2021-10-09 DIAGNOSIS — R45851 Suicidal ideations: Secondary | ICD-10-CM | POA: Diagnosis present

## 2021-10-09 DIAGNOSIS — G253 Myoclonus: Secondary | ICD-10-CM | POA: Diagnosis present

## 2021-10-09 DIAGNOSIS — F419 Anxiety disorder, unspecified: Secondary | ICD-10-CM | POA: Diagnosis present

## 2021-10-09 DIAGNOSIS — Z87891 Personal history of nicotine dependence: Secondary | ICD-10-CM | POA: Diagnosis not present

## 2021-10-09 DIAGNOSIS — F32A Depression, unspecified: Secondary | ICD-10-CM | POA: Diagnosis present

## 2021-10-09 DIAGNOSIS — E890 Postprocedural hypothyroidism: Secondary | ICD-10-CM | POA: Diagnosis present

## 2021-10-09 LAB — RESP PANEL BY RT-PCR (FLU A&B, COVID) ARPGX2
Influenza A by PCR: NEGATIVE
Influenza B by PCR: NEGATIVE
SARS Coronavirus 2 by RT PCR: NEGATIVE

## 2021-10-09 LAB — GLUCOSE, CAPILLARY: Glucose-Capillary: 99 mg/dL (ref 70–99)

## 2021-10-09 MED ORDER — TRAZODONE HCL 100 MG PO TABS: 100.0000 mg | ORAL_TABLET | Freq: Every evening | ORAL | Status: DC | PRN

## 2021-10-09 MED ORDER — CARBAMAZEPINE 200 MG PO TABS: 100.0000 mg | ORAL_TABLET | Freq: Every day | ORAL | Status: DC

## 2021-10-09 MED ORDER — ALUM & MAG HYDROXIDE-SIMETH 200-200-20 MG/5ML PO SUSP
30.0000 mL | ORAL | Status: DC | PRN
Start: 2021-10-09 — End: 2021-10-10

## 2021-10-09 MED ORDER — LORAZEPAM 1 MG PO TABS
1.0000 mg | ORAL_TABLET | Freq: Four times a day (QID) | ORAL | Status: DC | PRN
  Administered 2021-10-09: 1 mg via ORAL
  Filled 2021-10-09 (×2): qty 1

## 2021-10-09 MED ORDER — DIAZEPAM 5 MG/ML IJ SOLN
1.0000 mg | Freq: Once | INTRAMUSCULAR | Status: DC
  Filled 2021-10-09: qty 2

## 2021-10-09 MED ORDER — ESCITALOPRAM OXALATE 10 MG PO TABS: 10.0000 mg | ORAL_TABLET | Freq: Every day | ORAL | Status: DC

## 2021-10-09 MED ORDER — ALPRAZOLAM 0.25 MG PO TABS
0.5000 mg | ORAL_TABLET | Freq: Two times a day (BID) | ORAL | Status: DC | PRN
  Filled 2021-10-09: qty 2

## 2021-10-09 MED ORDER — LEVOTHYROXINE SODIUM 112 MCG PO TABS
112.0000 ug | ORAL_TABLET | ORAL | Status: DC
  Filled 2021-10-09: qty 1

## 2021-10-09 MED ORDER — CARBAMAZEPINE 100 MG PO CHEW
50.0000 mg | CHEWABLE_TABLET | Freq: Every day | ORAL | Status: DC
  Filled 2021-10-09: qty 0.5

## 2021-10-09 MED ORDER — ESCITALOPRAM OXALATE 10 MG PO TABS: 5.0000 mg | ORAL_TABLET | Freq: Once | ORAL | Status: DC

## 2021-10-09 MED ORDER — QUETIAPINE FUMARATE 100 MG PO TABS
100.0000 mg | ORAL_TABLET | Freq: Every day | ORAL | Status: DC
  Administered 2021-10-09: 100 mg via ORAL
  Filled 2021-10-09: qty 1

## 2021-10-09 MED ORDER — CARBAMAZEPINE 100 MG PO CHEW: 50.0000 mg | CHEWABLE_TABLET | Freq: Every day | ORAL | Status: DC

## 2021-10-09 MED ORDER — LEVOTHYROXINE SODIUM 112 MCG PO TABS: 112.0000 ug | ORAL_TABLET | ORAL | Status: DC

## 2021-10-09 MED ORDER — HYDROXYZINE HCL 25 MG PO TABS: 25.0000 mg | ORAL_TABLET | Freq: Every evening | ORAL | Status: DC | PRN

## 2021-10-09 MED ORDER — LORAZEPAM 1 MG PO TABS: 1.0000 mg | ORAL_TABLET | Freq: Two times a day (BID) | ORAL | Status: DC

## 2021-10-09 MED ORDER — LOSARTAN POTASSIUM 25 MG PO TABS
50.0000 mg | ORAL_TABLET | Freq: Every day | ORAL | Status: DC
  Administered 2021-10-09: 50 mg via ORAL
  Filled 2021-10-09: qty 2

## 2021-10-09 MED ORDER — CARBAMAZEPINE 100 MG PO CHEW
50.0000 mg | CHEWABLE_TABLET | Freq: Every day | ORAL | Status: DC
  Administered 2021-10-09: 50 mg via ORAL
  Filled 2021-10-09: qty 0.5

## 2021-10-09 MED ORDER — ADULT MULTIVITAMIN W/MINERALS CH
1.0000 | ORAL_TABLET | Freq: Every day | ORAL | Status: DC
  Administered 2021-10-09: 1 via ORAL
  Filled 2021-10-09: qty 1

## 2021-10-09 MED ORDER — LORAZEPAM 2 MG/ML IJ SOLN: 1.0000 mg | Freq: Once | INTRAMUSCULAR | Status: DC

## 2021-10-09 MED ORDER — ESCITALOPRAM OXALATE 10 MG PO TABS
5.0000 mg | ORAL_TABLET | Freq: Every day | ORAL | Status: DC
  Administered 2021-10-09: 5 mg via ORAL
  Filled 2021-10-09: qty 1

## 2021-10-09 MED ORDER — VITAMIN D 25 MCG (1000 UNIT) PO TABS
5000.0000 [IU] | ORAL_TABLET | Freq: Every day | ORAL | Status: DC
  Administered 2021-10-09: 5000 [IU] via ORAL
  Filled 2021-10-09: qty 5

## 2021-10-09 MED ORDER — ACETAMINOPHEN 500 MG PO TABS: 500.0000 mg | ORAL_TABLET | Freq: Three times a day (TID) | ORAL | Status: DC | PRN

## 2021-10-09 MED ORDER — LOSARTAN POTASSIUM 25 MG PO TABS: 50.0000 mg | ORAL_TABLET | Freq: Every day | ORAL | Status: DC

## 2021-10-09 MED ORDER — MAGNESIUM HYDROXIDE 400 MG/5ML PO SUSP: 30.0000 mL | Freq: Every day | ORAL | Status: DC | PRN

## 2021-10-09 MED ORDER — LEVOTHYROXINE SODIUM 100 MCG PO TABS
100.0000 ug | ORAL_TABLET | Freq: Every day | ORAL | Status: DC
  Administered 2021-10-09: 100 ug via ORAL
  Filled 2021-10-09: qty 1

## 2021-10-09 MED ORDER — LEVOTHYROXINE SODIUM 100 MCG PO TABS: 100.0000 ug | ORAL_TABLET | ORAL | Status: DC

## 2021-10-09 MED ORDER — ADULT MULTIVITAMIN W/MINERALS CH: 1.0000 | ORAL_TABLET | Freq: Every day | ORAL | Status: DC

## 2021-10-09 NOTE — ED Notes (Signed)
Unable to reach TTS for update on expected time of assessment for this patient ?

## 2021-10-09 NOTE — BH Assessment (Signed)
Comprehensive Clinical Assessment (CCA) Note ? ?10/09/2021 ?[name] ?[id number] ? ?Discharge Disposition: ?[name], NP, reviewed pt's chart and information and determined pt meets criteria for geri-psych. Pt's referral information will be faxed out to multiple hospitals for potential placement. This information was relayed to pt's team at [facility]. ? ?The patient demonstrates the following risk factors for suicide: Chronic risk factors for suicide include: psychiatric disorder of MDD, Recurrent Severe . Acute risk factors for suicide include: social withdrawal/isolation and loss (financial, interpersonal, professional). Protective factors for this patient include: positive social support. Considering these factors, the overall suicide risk at this point appears to be high. Patient is not appropriate for outpatient follow up. ? ?Therefore, a 1:1 sitter is recommended for suicide precautions. ? ?[place] ED from 10/08/2021 in [facility] ED from 10/06/2021 in [facility] ED from 09/09/2021 in [facility]  ?C-SSRS RISK CATEGORY High Risk Moderate Risk High Risk  ? ?  ?Chief Complaint:  ?Chief Complaint  ?Patient presents with  ? Suicidal  ? Anxiety  ? Depression  ? ?Visit Diagnosis: MDD, Recurrent, Severe; GAD ? ?CCA Screening, Triage and Referral (STR) ?Colleen [name] is a 71 year old patient who came to the [facility] ED due to ongoing depression and anxiety. Pt states, "I thought i was doing well enough to go home on Tuesday (after a stay at [facility] ED from 10/06/21 - 10/07/21). As soon as I hit that door I thought, 'this is a mistake' and I started getting panicky. It's this depression and anxiety that's been hanging on so long and came crashing down so hard and scared me."  ? ?She continues, "I tried to call (my NP) on Tuesday but I couldn't reach her. I really feel I'm not safe and I want to be in an environment  where I can talk to a therapist and a psychiatrist daily to see if I'm even on the right meds. My thoughts were inpatient therapy. At the beginning of March I had to go to [name] for 5 days. It was a very disappointing experience for me. I didn't get to talk to a therapist - there was only group therapy. I'm at this level of not feeling safe alone and really scared and need to know that there's someone there for me every day."  ? ?Pt endorses current SI that she states "came back on Tuesday." She denies she currently has a plan to kill herself. Pt denies AVH, HI, NSSIB, acces to guns/weapons, engagement with the legal system, or SA. ? ?Pt is oriented x5. Her recent/remote memory is intact. Pt was cooperative throughout the assessment process. Pt's insight, judgement, and impulse control is impaired at this time. ? ?Patient Reported Information ?How did you hear about [name]? Self ? ?What Is the Reason for Your Visit/Call Today? Pt states, "I thought i was doing well enough to go home on Tuesday (after a stay at [name] ED from 10/06/21 - 10/07/21). As soon as I hit that door I thought, 'this is a mistake' and I started getting panicky. It's this depression and anxiety that's been hanging on so long and came crashing down so hard and scared me." She continues, "I tried to call (my NP) on Tuesday but I couldn't reach her. I really feel I'm not safe and I want to be in an environment where I can talk to a therapist and a psychiatrist daily to see if I'm even on the right meds. My thoughts  were inpatient therapy. At the beginning of March I had to go to [name] for 5 days. It was a very disappointing experience for me. I didn't get to talk to a therapist - there was only group therapy. I'm at this level of not feeling safe alone and really scared and need to know that there's someone there for me every day." Pt endorses current SI that she states "came back on Tuesday." She denies she currently has a plan to  kill herself. Pt denies AVH, HI, NSSIB, acces to guns/weapons, engagement with the legal system, or SA. ? ?How Long Has This Been Causing You Problems? 1 wk - 1 month ? ?What Do You Feel Would Help You the Most Today? Medication(s); Treatment for Depression or other mood problem ? ? ?Have You Recently Had Any Thoughts About Hurting Yourself? Yes ? ?Are You Planning to Commit Suicide/Harm Yourself At This time? No ? ? ?Have you Recently Had Thoughts About [place]? No ? ?Are You Planning to Harm Someone at This Time? No ? ?Explanation: No data recorded ? ?Have You Used Any Alcohol or Drugs in the Past 24 Hours? No ? ?How Long Ago Did You Use Drugs or Alcohol? No data recorded ?What Did You Use and How Much? No data recorded ? ?Do You Currently Have a Therapist/Psychiatrist? Yes ? ?Name of Therapist/Psychiatrist: Pt sees [name], [place] for therapy and [name], P-NP at [facility] for medication ? ? ?Have You Been Recently Discharged From Any Office Practice or Programs? Yes ? ?Explanation of Discharge From Practice/Program: Pt was d/c from [facility] after admission from March 1 - September 16, 2021 ? ? ?  ?CCA Screening Triage Referral Assessment ?Type of Contact: Tele-Assessment ? ?Telemedicine Service Delivery: Telemedicine service delivery: This service was provided via telemedicine using a 2-way, interactive audio and video technology ? ?Is this Initial or Reassessment? Initial Assessment ? ?Date Telepsych consult ordered in [facility]:  10/08/21 ? ?Time Telepsych consult ordered in [facility]:  1124 ? ?Location of Assessment: Other (comment) ([place] ED) ? ?Provider Location: [facility] Assessment Services ? ? ?Collateral Involvement: Pt declined to provide verbal consent for clinician to make contact with friends/relatives. ? ? ?Does Patient Have a Stage manager Guardian? No data recorded ?Name and Contact of Legal Guardian: No data recorded ?If Minor and Not Living with Parent(s), Who has  Custody? N/A ? ?Is CPS involved or ever been involved? Never ? ?Is APS involved or ever been involved? Never ? ? ?Patient Determined To Be At Risk for Harm To Self or Others Based on Review of Patient Reported Information or Presenting Complaint? Yes, for Self-Harm ? ?Method: No data recorded ?Availability of Means: No data recorded ?Intent: No data recorded ?Notification Required: No data recorded ?Additional Information for Danger to Others Potential: No data recorded ?Additional Comments for Danger to Others Potential: No data recorded ?Are There Guns or Other Weapons in [place]? No data recorded ?Types of Guns/Weapons: No data recorded ?Are These Weapons Safely Secured?                            No data recorded ?Who Could Verify You Are Able To Have These Secured: No data recorded ?Do You Have any Outstanding Charges, Pending Court Dates, Parole/Probation? No data recorded ?Contacted To Inform of Risk of Harm To Self or Others: Other: Comment (Pt declined to provide verbal consent for clinician to make  contact with friends/family) ? ? ? ?Does Patient Present under Involuntary Commitment? No ? ?IVC Papers Initial File Date: No data recorded ? ?[place] of Residence: Kathleen Argue ? ? ?Patient Currently Receiving the Following Services: Individual Therapy; Medication Management ? ? ?Determination of Need: Emergent (2 hours) ? ? ?Options For Referral: Medication Management; Outpatient Therapy; Geropsychiatric Facility ? ? ? ? ?CCA Biopsychosocial ?Patient Reported Schizophrenia/Schizoaffective Diagnosis in Past: No ? ? ?Strengths: Pt is able to advocate for herself. Pt is able to identify and express her thoughts, feelings, and concerns. Pt already utilizes therapy and medication management services. Pt has consistent housing and good supports. ? ? ?Mental Health Symptoms ?Depression:   ?Change in energy/activity; Difficulty Concentrating; Irritability; Hopelessness; Sleep (too much or little); Tearfulness;  Worthlessness; Increase/decrease in appetite ?  ?Duration of Depressive symptoms:  ?Duration of Depressive Symptoms: Greater than two weeks ?  ?Mania:   ?Racing thoughts ?  ?Anxiety:    ?Difficulty concentrating; Fat

## 2021-10-09 NOTE — ED Notes (Signed)
Pt requesting to make a phone call, pt informed phone call hours start at 10am.  ?

## 2021-10-09 NOTE — ED Notes (Signed)
Spoke to [employer].  Transport delayed d/t driver being on a different run.  ?

## 2021-10-09 NOTE — Tx Team (Signed)
Initial Treatment Plan ?10/09/2021 ?5:57 PM ?Colleen [name] ?[id number] ? ? ? ?PATIENT STRESSORS: ?Health problems   ? ? ?PATIENT STRENGTHS: ?Communication skills  ?Religious Affiliation  ?Supportive family/friends  ? ? ?PATIENT IDENTIFIED PROBLEMS: ?Suicidal ideation (per patient she want to learn how to deal with the intrusive feeling of suicidal thought)  ?  ?  ?  ?  ?  ?  ?  ?  ?  ? ?DISCHARGE CRITERIA:  ?Improved stabilization in mood, thinking, and/or behavior ?Motivation to continue treatment in a less acute level of care ?Reduction of life-threatening or endangering symptoms to within safe limits ? ?PRELIMINARY DISCHARGE PLAN: ?Return to previous living arrangement ? ?PATIENT/FAMILY INVOLVEMENT: ?This treatment plan has been presented to and reviewed with the patient, Colleen [name]. The patient have been given the opportunity to ask questions and make suggestions. ? ?Taelon Bendorf, RN ?10/09/2021, 5:57 PM ?

## 2021-10-09 NOTE — ED Notes (Signed)
All belongings returned to Pt and given to transport.  EMTALA and Voluntary Form given to transport.  ?

## 2021-10-09 NOTE — Progress Notes (Signed)
BHH/BMU LCSW Progress Note ?  ?10/09/2021    2:11 PM ? ?[place]  ? ?[id number]  ? ?Type of Contact and Topic:  Psychiatric Bed Placement  ? ?Pt accepted to [facility] L33  ? ?Patient meets inpatient criteria per [name], NP ? ?The attending provider will be Caren Griffins, DO  ? ?Call report to [phone]   ? ?Gardiner Rhyme, RN @ [facility] ED notified.    ? ?Pt scheduled  to arrive at [place].  ? ? ?Mariea Clonts, MSW, LCSW-A  ?2:13 PM 10/09/2021   ?  ? ?  ?  ? ? ? ? ?  ?

## 2021-10-09 NOTE — ED Notes (Signed)
Pt transported to [facility] ED with safe transport. 2 people ambulated pt to safe transport. ?

## 2021-10-09 NOTE — ED Provider Notes (Signed)
Accepted by Dr. Bebe Shaggy to board in the ED as patient has been here nearly 20 hours and has been recommended for inpatient care.   ?  Cy Blamer, MD ?10/09/21 7829 ? ?

## 2021-10-09 NOTE — BH Assessment (Signed)
Patient has been accepted to [facility] Geriatric Psych Unit  ?Accepting physician is Dr. Marlou Porch.  ?Attending  Physician will be Dr. Marlou Porch.  ?Patient has been assigned to room L33, by [facility] Charge Nurse Minouge H..   ?Call report to [phone].  ?Representative/Transfer Coordinator is Jerilynn Som ?Patient pre-admitted by [facility] Patient Access Sue Lush) ? ?[facility] ER Staff Evangeline Dakin, RN) made aware of acceptance. ?

## 2021-10-09 NOTE — Progress Notes (Signed)
Patient admitted voluntarily to [facility] from [place] ED with diagnosis of  Major depressive disorder. Patient verbalizes suicidal thoughts with no plan in place and verbally contracts for safety on the unit. Patient presents to unit ambulatory, alert and oriented x4.  Patient is able to state why she is here "I need help with these suicidal thoughts." Patient's affect is extremely anxious and  patient endorses depression and anxiety 10/10. Patient denies HI, AVH and pain. Patient states that her health issues are her stressors. Patient reports fair appetite. Denies incontinence and reports last BM 10/07/2021  Patient denies smoking, drug abuse and ETOH use. Patient reports living alone, with her daughters and friends as support system. Patient is divorced. When asked what her strengths are or the goals she would like to work on while here pt states, "my strengths are my friendships, my courage and my children. My goals while here is to learn how to deal with these intrusive feelings (suicidal thoughts)."   ? ?Emotional support and reassurance provided throughout admission intake. Afterwards, oriented patient to unit, room and call light, reviewed POC with all questions answered and understanding verbalzied. Medications  given as ordered.  Pt compliant with all meds, swallows without difficulty and ate 50% of dinner. Patient is placed on a low risk for falls.  Denies any needs at this time.  Will continue to monitor with ongoing Q 15 minute safety checks per unit protocol.  ?

## 2021-10-09 NOTE — ED Notes (Signed)
Spoke to pts family about coming to get her belongings that were left at [facility], Denny Peon, daughter ?

## 2021-10-09 NOTE — ED Provider Notes (Signed)
Emergency Medicine Observation Re-evaluation Note ? ?Colleen [name] is a 71 y.o. female, seen on rounds today.  Pt initially presented to the ED for complaints of Suicidal, Anxiety, and Depression ?Currently, the patient is comfortable, awake . ? ?Physical Exam  ?BP (!) 148/95 (BP Location: Right Arm)   Pulse 86   Temp (!) 97.4 ?F (36.3 ?C) (Oral)   Resp 18   Ht 5\' 4"  (1.626 m)   Wt 67.1 kg   SpO2 96%   BMI 25.40 kg/m?  ?Physical Exam ?General: No acute distress ?ED Course / MDM  ?EKG:EKG Interpretation ? ?Date/Time:  Thursday October 08 2021 12:16:25 EDT ?Ventricular Rate:  78 ?PR Interval:  148 ?QRS Duration: 78 ?QT Interval:  376 ?QTC Calculation: 428 ?R Axis:   45 ?Text Interpretation: Normal sinus rhythm Normal ECG When compared with ECG of 10-Sep-2021 12:13, Vent. rate has increased BY  32 BPM QRS duration has decreased Confirmed by 12-Sep-2021 [phone]) on 10/08/2021 12:30:47 PM ? ?I have reviewed the labs performed to date as well as medications administered while in observation.  Recent changes in the last 24 hours include [facility] ? ?Plan  ?Current plan is for admission. ? Colleen [name] is not under involuntary commitment. ? ? ?  ?[name], MD ?10/09/21 1438 ? ?

## 2021-10-09 NOTE — ED Notes (Signed)
Report received and care resumed.

## 2021-10-09 NOTE — ED Notes (Signed)
Pt requesting home medications.  EDP made aware.  ?

## 2021-10-09 NOTE — ED Notes (Signed)
After reviewing the [facility] and speaking to EDP, it looks like all medications were ordered and are due at 1000.  Pt made aware.   ?

## 2021-10-09 NOTE — ED Notes (Signed)
Called [facility] to take patient to [facility] Bebe Shaggy accepting  ?

## 2021-10-09 NOTE — ED Notes (Signed)
Attempted to Research officer, political party.  Someone will call me back. ?

## 2021-10-10 ENCOUNTER — Inpatient Hospital Stay
Admission: AD | Admit: 2021-10-10 | Discharge: 2021-10-10 | DRG: 101 | Disposition: A | Discharge status: Other Acute Inpatient Hospital | Payer: Medicare Other | Source: Intra-hospital | Attending: Student | Admitting: Student

## 2021-10-10 ENCOUNTER — Encounter: Payer: Self-pay | Admitting: Psychiatry

## 2021-10-10 ENCOUNTER — Inpatient Hospital Stay
Admission: AD | Admit: 2021-10-10 | Discharge: 2021-10-27 | DRG: 885 | Disposition: A | Discharge status: Home / Self Care | Payer: Medicare Other | Source: Intra-hospital | Attending: Psychiatry | Admitting: Psychiatry

## 2021-10-10 DIAGNOSIS — Z87891 Personal history of nicotine dependence: Secondary | ICD-10-CM

## 2021-10-10 DIAGNOSIS — R55 Syncope and collapse: Secondary | ICD-10-CM

## 2021-10-10 DIAGNOSIS — K219 Gastro-esophageal reflux disease without esophagitis: Secondary | ICD-10-CM | POA: Diagnosis present

## 2021-10-10 DIAGNOSIS — Z20822 Contact with and (suspected) exposure to covid-19: Secondary | ICD-10-CM | POA: Diagnosis present

## 2021-10-10 DIAGNOSIS — E871 Hypo-osmolality and hyponatremia: Secondary | ICD-10-CM | POA: Diagnosis present

## 2021-10-10 DIAGNOSIS — M62838 Other muscle spasm: Secondary | ICD-10-CM | POA: Diagnosis present

## 2021-10-10 DIAGNOSIS — E890 Postprocedural hypothyroidism: Secondary | ICD-10-CM | POA: Diagnosis present

## 2021-10-10 DIAGNOSIS — Z79899 Other long term (current) drug therapy: Secondary | ICD-10-CM | POA: Diagnosis not present

## 2021-10-10 DIAGNOSIS — Z8249 Family history of ischemic heart disease and other diseases of the circulatory system: Secondary | ICD-10-CM | POA: Diagnosis not present

## 2021-10-10 DIAGNOSIS — F332 Major depressive disorder, recurrent severe without psychotic features: Secondary | ICD-10-CM | POA: Diagnosis present

## 2021-10-10 DIAGNOSIS — G253 Myoclonus: Secondary | ICD-10-CM | POA: Diagnosis present

## 2021-10-10 DIAGNOSIS — G4700 Insomnia, unspecified: Secondary | ICD-10-CM | POA: Diagnosis present

## 2021-10-10 DIAGNOSIS — I959 Hypotension, unspecified: Secondary | ICD-10-CM | POA: Diagnosis present

## 2021-10-10 DIAGNOSIS — R569 Unspecified convulsions: Principal | ICD-10-CM

## 2021-10-10 DIAGNOSIS — Z91048 Other nonmedicinal substance allergy status: Secondary | ICD-10-CM | POA: Diagnosis not present

## 2021-10-10 DIAGNOSIS — I10 Essential (primary) hypertension: Secondary | ICD-10-CM | POA: Diagnosis present

## 2021-10-10 DIAGNOSIS — F419 Anxiety disorder, unspecified: Secondary | ICD-10-CM | POA: Diagnosis present

## 2021-10-10 DIAGNOSIS — F32A Depression, unspecified: Secondary | ICD-10-CM | POA: Diagnosis present

## 2021-10-10 DIAGNOSIS — R45851 Suicidal ideations: Secondary | ICD-10-CM | POA: Diagnosis present

## 2021-10-10 DIAGNOSIS — Z79890 Hormone replacement therapy (postmenopausal): Secondary | ICD-10-CM | POA: Diagnosis not present

## 2021-10-10 DIAGNOSIS — Z888 Allergy status to other drugs, medicaments and biological substances status: Secondary | ICD-10-CM | POA: Diagnosis not present

## 2021-10-10 LAB — CBC
HCT: 33.1 % — ABNORMAL LOW (ref 36.0–46.0)
Hemoglobin: 11.6 g/dL — ABNORMAL LOW (ref 12.0–15.0)
MCH: 31.6 pg (ref 26.0–34.0)
MCHC: 35 g/dL (ref 30.0–36.0)
MCV: 90.2 fL (ref 80.0–100.0)
Platelets: 250 10*3/uL (ref 150–400)
RBC: 3.67 MIL/uL — ABNORMAL LOW (ref 3.87–5.11)
RDW: 11.2 % — ABNORMAL LOW (ref 11.5–15.5)
WBC: 5.4 10*3/uL (ref 4.0–10.5)
nRBC: 0 % (ref 0.0–0.2)

## 2021-10-10 LAB — COMPREHENSIVE METABOLIC PANEL
ALT: 19 U/L (ref 0–44)
AST: 19 U/L (ref 15–41)
Albumin: 3.7 g/dL (ref 3.5–5.0)
Alkaline Phosphatase: 51 U/L (ref 38–126)
Anion gap: 9 (ref 5–15)
BUN: 8 mg/dL (ref 8–23)
CO2: 27 mmol/L (ref 22–32)
Calcium: 8.9 mg/dL (ref 8.9–10.3)
Chloride: 95 mmol/L — ABNORMAL LOW (ref 98–111)
Creatinine, Ser: 0.79 mg/dL (ref 0.44–1.00)
GFR, Estimated: >60 mL/min (ref >60)
Glucose, Bld: 102 mg/dL — ABNORMAL HIGH (ref 70–99)
Potassium: 3.9 mmol/L (ref 3.5–5.1)
Sodium: 131 mmol/L — ABNORMAL LOW (ref 135–145)
Total Bilirubin: 0.5 mg/dL (ref 0.3–1.2)
Total Protein: 6.8 g/dL (ref 6.5–8.1)

## 2021-10-10 LAB — PHOSPHORUS: Phosphorus: 4.6 mg/dL (ref 2.5–4.6)

## 2021-10-10 LAB — TSH: TSH: 3.16 u[IU]/mL (ref 0.350–4.500)

## 2021-10-10 LAB — HIV ANTIBODY (ROUTINE TESTING W REFLEX): HIV Screen 4th Generation wRfx: NONREACTIVE

## 2021-10-10 LAB — MAGNESIUM: Magnesium: 2.1 mg/dL (ref 1.7–2.4)

## 2021-10-10 MED ORDER — ADULT MULTIVITAMIN W/MINERALS CH
1.0000 | ORAL_TABLET | Freq: Every day | ORAL | Status: DC
Start: 2021-10-11 — End: 2021-10-27
  Administered 2021-10-11 – 2021-10-27 (×17): 1 via ORAL
  Filled 2021-10-10 (×17): qty 1

## 2021-10-10 MED ORDER — ONDANSETRON HCL 4 MG PO TABS: 4.0000 mg | ORAL_TABLET | Freq: Four times a day (QID) | ORAL | Status: DC | PRN

## 2021-10-10 MED ORDER — ONE-DAILY MULTI VITAMINS PO TABS: 1.0000 | ORAL_TABLET | Freq: Every day | ORAL | Status: DC

## 2021-10-10 MED ORDER — LEVOTHYROXINE SODIUM 100 MCG PO TABS
100.0000 ug | ORAL_TABLET | Freq: Every day | ORAL | Status: DC
  Administered 2021-10-10: 100 ug via ORAL
  Filled 2021-10-10: qty 1

## 2021-10-10 MED ORDER — LOSARTAN POTASSIUM 50 MG PO TABS: 50.0000 mg | ORAL_TABLET | Freq: Every day | ORAL | Status: DC

## 2021-10-10 MED ORDER — VITAMIN D 25 MCG (1000 UNIT) PO TABS
5000.0000 [IU] | ORAL_TABLET | Freq: Every day | ORAL | Status: DC
  Administered 2021-10-10: 5000 [IU] via ORAL
  Filled 2021-10-10: qty 5

## 2021-10-10 MED ORDER — LORAZEPAM 1 MG PO TABS
1.0000 mg | ORAL_TABLET | Freq: Four times a day (QID) | ORAL | Status: DC | PRN
  Administered 2021-10-10: 1 mg via ORAL
  Filled 2021-10-10: qty 1

## 2021-10-10 MED ORDER — HYDROXYZINE HCL 25 MG PO TABS
25.0000 mg | ORAL_TABLET | Freq: Every evening | ORAL | Status: DC | PRN
  Administered 2021-10-10 – 2021-10-12 (×2): 25 mg via ORAL
  Filled 2021-10-10 (×3): qty 1

## 2021-10-10 MED ORDER — ESCITALOPRAM OXALATE 10 MG PO TABS
10.0000 mg | ORAL_TABLET | Freq: Every day | ORAL | Status: DC
  Administered 2021-10-11 – 2021-10-13 (×3): 10 mg via ORAL
  Filled 2021-10-10 (×3): qty 1

## 2021-10-10 MED ORDER — ONDANSETRON HCL 4 MG/2ML IJ SOLN
4.0000 mg | Freq: Four times a day (QID) | INTRAMUSCULAR | Status: DC | PRN
Start: 2021-10-10 — End: 2021-10-10

## 2021-10-10 MED ORDER — LEVOTHYROXINE SODIUM 100 MCG PO TABS
100.0000 ug | ORAL_TABLET | Freq: Every day | ORAL | Status: DC
Start: 2021-10-11 — End: 2021-10-27
  Administered 2021-10-11 – 2021-10-27 (×17): 100 ug via ORAL
  Filled 2021-10-10 (×18): qty 1

## 2021-10-10 MED ORDER — ONDANSETRON HCL 4 MG/2ML IJ SOLN: 4.0000 mg | Freq: Four times a day (QID) | INTRAMUSCULAR | Status: DC | PRN

## 2021-10-10 MED ORDER — LOSARTAN POTASSIUM 25 MG PO TABS
50.0000 mg | ORAL_TABLET | Freq: Every day | ORAL | Status: DC
Start: 2021-10-11 — End: 2021-10-16
  Administered 2021-10-11 – 2021-10-16 (×6): 50 mg via ORAL
  Filled 2021-10-10 (×6): qty 2

## 2021-10-10 MED ORDER — ACETAMINOPHEN 500 MG PO TABS: 500.0000 mg | ORAL_TABLET | Freq: Three times a day (TID) | ORAL | Status: DC | PRN

## 2021-10-10 MED ORDER — SODIUM CHLORIDE 0.9 % IV SOLN: INTRAVENOUS | Status: AC

## 2021-10-10 MED ORDER — ADULT MULTIVITAMIN W/MINERALS CH: 1.0000 | ORAL_TABLET | Freq: Every day | ORAL | Status: DC

## 2021-10-10 MED ORDER — HYDROXYZINE HCL 25 MG PO TABS: 25.0000 mg | ORAL_TABLET | Freq: Every evening | ORAL | Status: DC | PRN

## 2021-10-10 MED ORDER — ALUM & MAG HYDROXIDE-SIMETH 200-200-20 MG/5ML PO SUSP
30.0000 mL | ORAL | Status: DC | PRN
Start: 2021-10-10 — End: 2021-10-27
  Administered 2021-10-13 – 2021-10-19 (×2): 30 mL via ORAL
  Filled 2021-10-10 (×2): qty 30

## 2021-10-10 MED ORDER — VITAMIN D 25 MCG (1000 UNIT) PO TABS
5000.0000 [IU] | ORAL_TABLET | Freq: Every day | ORAL | Status: DC
  Administered 2021-10-11 – 2021-10-27 (×15): 5000 [IU] via ORAL
  Filled 2021-10-10 (×16): qty 5

## 2021-10-10 MED ORDER — CARBAMAZEPINE 100 MG PO CHEW
50.0000 mg | CHEWABLE_TABLET | Freq: Every day | ORAL | Status: DC
  Administered 2021-10-10: 50 mg via ORAL
  Filled 2021-10-10: qty 0.5

## 2021-10-10 MED ORDER — LOSARTAN POTASSIUM 50 MG PO TABS
50.0000 mg | ORAL_TABLET | Freq: Every day | ORAL | Status: DC
  Administered 2021-10-10: 50 mg via ORAL
  Filled 2021-10-10 (×2): qty 1

## 2021-10-10 MED ORDER — LORAZEPAM 2 MG/ML IJ SOLN: 2.0000 mg | INTRAMUSCULAR | Status: DC | PRN

## 2021-10-10 MED ORDER — QUETIAPINE FUMARATE 100 MG PO TABS: 100.0000 mg | ORAL_TABLET | Freq: Every day | ORAL | Status: DC

## 2021-10-10 MED ORDER — ESCITALOPRAM OXALATE 10 MG PO TABS
5.0000 mg | ORAL_TABLET | Freq: Every day | ORAL | Status: DC
  Filled 2021-10-10: qty 0.5

## 2021-10-10 MED ORDER — MAGNESIUM HYDROXIDE 400 MG/5ML PO SUSP
30.0000 mL | Freq: Every day | ORAL | Status: DC | PRN
  Administered 2021-10-10 – 2021-10-25 (×3): 30 mL via ORAL
  Filled 2021-10-10 (×3): qty 30

## 2021-10-10 MED ORDER — LORAZEPAM 2 MG/ML IJ SOLN
INTRAMUSCULAR | Status: AC
  Administered 2021-10-10: 1 mg
  Filled 2021-10-10: qty 1

## 2021-10-10 MED ORDER — CARBAMAZEPINE 100 MG PO CHEW
50.0000 mg | CHEWABLE_TABLET | Freq: Every day | ORAL | Status: DC
  Administered 2021-10-11 – 2021-10-12 (×2): 50 mg via ORAL
  Filled 2021-10-10 (×3): qty 0.5

## 2021-10-10 MED ORDER — ESCITALOPRAM OXALATE 10 MG PO TABS
10.0000 mg | ORAL_TABLET | Freq: Every day | ORAL | Status: DC
  Administered 2021-10-10: 10 mg via ORAL
  Filled 2021-10-10: qty 1

## 2021-10-10 NOTE — Plan of Care (Signed)
Report given to receiving nurse Whitney on geropsych, patient is in stable condition. ?

## 2021-10-10 NOTE — Progress Notes (Signed)
Patient observed having ongoing seizure like activity with tremors and c/o muscle spasms.  Each event lasting 30 to 45 seconds. Patient continues to have intermittent seizure like activity.  On site APP, AC notified. Hospitalist informed of event and assessed pt. Hospitalist did order medications to manage symptoms.   ?

## 2021-10-10 NOTE — H&P (Signed)
?History and Physical  ? ? ?Colleen [name] SJG:[id number] DOB: 07-20-1950 DOA: 10/10/2021 ? ?PCP: Irven Coe, MD  ?Patient coming from: geropsych ? ?I have personally briefly reviewed patient's old medical records in [facility] ? ?Chief Complaint: seizure activity low bp  ? ?HPI: Colleen [name] is a 71 y.o. female with medical history significant for Anxiety/depression,GERD, myoclonus, patient denies history of SZ documented on chart. 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 She notes that she recently started tegretol to treat her symptoms. IN any event patient presented to [facility] with SI and depression. ON evaluation she was noted  to have  Na iof 126 on 3/8 which has improved since then s/p ivfs to  133 and now currently 131.  Of note patient baseline 135.  Patient due to stable NA was  transferred to [facility] ward at [facility].  Per patient she states s/p taking her blood pressure medication and seroquel she began to feel dizzy and states that she felt as if she would pass out. Per RN noted patient at that time has low bp  78/56 HR 109. ?Patient per notes was also complaining of anxiety. Thereafter patient lost her balance and has a presyncope episode. During episode patient per staff was note to have twitching episode. Per patient the tremors she was experienced at that time is typical of her known myoclonus.  In any event due lower blood pressure s/p medication and noted myoclonus psych preferred to have patient transfer to medicine service for stabilization. Patient currently states she feels back her normal self . She denies any fever /chills/ n/v/d/ abdominal pain/cp or sob.  She does however note her anxiety and depression  have increased and she does continue endorse SI. She however notes no current plan. ? ? ?ED Course:  ?As notes above ? ?Review of Systems: As per HPI otherwise 10 point review of  systems negative.  ? ?Past Medical History:  ?Diagnosis Date  ? Allergy   ? Anxiety   ? Arrhythmia   ? "does not see cardiologist, sees pcp, Dr. Alonza Smoker  ? Asthma   ? DX IN MARCH 2012 "allergy induced asthma"  ? Bronchitis   ? hx of  ? Depression   ? "past hx of for Panic attacks, not on medication at this time"  ? Family history of anesthesia complication   ? mother "nausea and vomiting post surger"  ? GERD (gastroesophageal reflux disease)   ? Goiter 03/17/2012  ? 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 Heartburn   ? Herpes   ? on medication for outbreaks only  ? Hypertension   ? Seizures (HCC)   ? MEDICATION INDUCED  ? Urinary tract infection   ? hx of  ? ? ?Past Surgical History:  ?Procedure Laterality Date  ? CESAREAN SECTION    ? x 2  ? COLONOSCOPY    ? ELBOW SURGERY    ? right  ? KNEE ARTHROSCOPY    ? right  ? PILONIDAL CYST EXCISION    ? spinal cystectomy    ? "where pilonidal cyst was"  ? THYROIDECTOMY  04/07/2012  ? Procedure: THYROIDECTOMY;  Surgeon: Currie Paris, MD;  Location: [facility] OR;  Service: General;  Laterality: N/A;   ?  ? ? ? reports that she quit smoking about 22 years ago. She has never  used smokeless tobacco. She reports that she does not drink alcohol and does not use drugs. ? ?Allergies  ?Allergen Reactions  ? Amlodipine Other (See Comments)  ?  Flushing, tingling in face, sore throat ? ?(Norvasc)  ? Aripiprazole Other (See Comments)  ? Cyclobenzaprine Hcl Other (See Comments)  ?  Seizures ?  ? Cymbalta [Duloxetine Hcl] Other (See Comments)  ?  "Made the depression worse"  ? Lamictal [Lamotrigine] Other (See Comments)  ?  Constipation and irritability (in the level/dose that actually might help)  ? Other Other (See Comments)  ? Sertraline Diarrhea  ? Trazodone Other (See Comments)  ?  Nightmares ?  ? Tape Rash  ?  Blisters  ? ? ?Family History  ?Problem Relation Age of Onset  ? Heart failure Father   ? Hypertension  Father   ? Seizures Father   ? Heart disease Father   ? Cervical cancer Mother   ? Seizures Brother   ? Irritable bowel syndrome Brother   ? Cirrhosis Brother   ? Hypertension Brother   ?     x 4  ? Fibromyalgia Sister   ? Atrial fibrillation Sister   ? Prostate cancer Brother   ? Breast cancer Neg Hx   ? ?Prior to Admission medications   ?Not on File  ? ? ?Physical Exam: ?There were no vitals filed for this visit. ? ?Constitutional: NAD, calm, comfortable ?Eyes: PERRL, lids and conjunctivae normal ?ENMT: Mucous membranes are moist. Posterior pharynx clear of any exudate or lesions.Normal dentition.  ?Neck: normal, supple, no masses, no thyromegaly ?Respiratory: clear to auscultation bilaterally, no wheezing, no crackles. Normal respiratory effort. No accessory muscle use.  ?Cardiovascular: Regular rate and rhythm, no murmurs / rubs / gallops. No extremity edema. 2+ pedal pulses. No carotid bruits.  ?Abdomen: no tenderness, no masses palpated. No hepatosplenomegaly. Bowel sounds positive.  ?Musculoskeletal: no clubbing / cyanosis. No joint deformity upper and lower extremities. Good ROM, no contractures. Normal muscle tone.  ?Skin: no rashes, lesions, ulcers. No induration ?Neurologic: CN 2-12 grossly intact. Sensation intact, DTR normal. Strength 5/5 in all 4.  ?Psychiatric: Normal judgment and insight. Alert and oriented x 3. Normal mood.  ? ? ?Labs on Admission: I have personally reviewed following labs and imaging studies ? ?CBC: ?Recent Labs  ?Lab 10/06/21 ?1014 10/08/21 ?1236  ?WBC 6.7 5.2  ?NEUTROABS 4.9 3.7  ?HGB 14.3 12.1  ?HCT 40.3 34.6*  ?MCV 90.8 91.5  ?PLT 343 265  ? ?Basic Metabolic Panel: ?Recent Labs  ?Lab 10/06/21 ?1014 10/06/21 ?1630 10/07/21 ?1032 10/08/21 ?1236  ?NA 126* 127* 133* 133*  ?K 4.0 3.6 3.9 4.4  ?CL 89* 93* 103 98  ?CO2 26 26 23 27   ?GLUCOSE 100* 123* 98 96  ?BUN 7* 6* 6* 7*  ?CREATININE 0.83 0.73 0.77 0.83  ?CALCIUM 9.9 9.0 7.9* 9.3  ? ?GFR: ?Estimated Creatinine Clearance: 54.5  mL/min (by C-G formula based on SCr of 0.83 mg/dL). ?Liver Function Tests: ?Recent Labs  ?Lab 10/06/21 ?1014 10/08/21 ?1236  ?AST 18 16  ?ALT 17 17  ?ALKPHOS 61 62  ?BILITOT 0.6 0.3  ?PROT 7.9 7.0  ?ALBUMIN 4.8 4.3  ? ?No results for input(s): LIPASE, AMYLASE in the last 168 hours. ?No results for input(s): AMMONIA in the last 168 hours. ?Coagulation Profile: ?No results for input(s): INR, PROTIME in the last 168 hours. ?Cardiac Enzymes: ?No results for input(s): CKTOTAL, CKMB, CKMBINDEX, TROPONINI in the last 168 hours. ?BNP (last 3 results) ?No results for  input(s): PROBNP in the last 8760 hours. ?HbA1C: ?No results for input(s): HGBA1C in the last 72 hours. ?CBG: ?Recent Labs  ?Lab 10/09/21 ?2259  ?GLUCAP 99  ? ?Lipid Profile: ?No results for input(s): CHOL, HDL, LDLCALC, TRIG, CHOLHDL, LDLDIRECT in the last 72 hours. ?Thyroid Function Tests: ?No results for input(s): TSH, T4TOTAL, FREET4, T3FREE, THYROIDAB in the last 72 hours. ?Anemia Panel: ?No results for input(s): VITAMINB12, FOLATE, FERRITIN, TIBC, IRON, RETICCTPCT in the last 72 hours. ?Urine analysis: ?   ?Component Value Date/Time  ? COLORURINE COLORLESS (A) 10/08/2021 1258  ? APPEARANCEUR CLEAR 10/08/2021 1258  ? LABSPEC <1.005 (L) 10/08/2021 1258  ? PHURINE 7.0 10/08/2021 1258  ? GLUCOSEU NEGATIVE 10/08/2021 1258  ? GLUCOSEU NEGATIVE 04/17/2010 0750  ? HGBUR NEGATIVE 10/08/2021 1258  ? HGBUR trace-lysed 11/09/2007 0829  ? BILIRUBINUR NEGATIVE 10/08/2021 1258  ? BILIRUBINUR negative 08/27/2014 1123  ? [place] Atlas NEGATIVE 10/08/2021 1258  ? PROTEINUR NEGATIVE 10/08/2021 1258  ? UROBILINOGEN 0.2 08/27/2014 1123  ? UROBILINOGEN 0.2 04/26/2014 1433  ? NITRITE NEGATIVE 10/08/2021 1258  ? LEUKOCYTESUR NEGATIVE 10/08/2021 1258  ? ? ?Radiological Exams on Admission: ?No results found. ? ?EKG: Independently reviewed. Pending  ? ?Assessment/Plan ?Near Syncope  ?-due to med  side effect  ?-hold bp meds currently  as well as psych medications ?-psych to see and f/u  with resuming medications as able  ?-resume bp med as bp allows  ? ?Myoclonus  ?-it appears that patient did not have sz  ?-chronic issue for which she was placed on tegretol  ?- however will hold this as patie

## 2021-10-10 NOTE — Discharge Summary (Signed)
[facility] Discharge Summary ? ? ?Patient: Colleen [name] KGO:[id number]  PCP: Irven Coe, MD  ?Date of admission: 10/10/2021   Date of discharge:  10/10/2021   ?  ?Discharge Diagnoses:  ?Principal Problem: ?  Seizure (HCC) ? ? ?Admitted From: [facility] hospital, transferred to [facility] but developed near syncope due to hypotension ?Disposition: Transfer to [facility] psych unit ? ?Recommendations for Outpatient Follow-up:  ?PCP: In 1 week ?Follow-up with psych for further management ?Follow up LABS/TEST: BMP and serum osmolarity in 1 week ? ? ?Diet recommendation: Regular diet ? ?Activity: The patient is advised to gradually reintroduce usual activities, as tolerated ? ?Discharge Condition: stable ? ?Code Status: Full code  ? ?History of present illness: As per the H and P dictated on admission ? ?Hospital Course:  ?Colleen [name] is a 71 y.o. female with medical history significant for Anxiety/depression,GERD, myoclonus, patient denies history of SZ documented on chart. 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 She notes that she recently started tegretol to treat her symptoms. IN any event patient presented to [facility] with SI and depression. ON evaluation she was noted  to have  Na iof 126 on 3/8 which has improved since then s/p ivfs to  133 and now currently 131.  Of note patient baseline 135.  Patient due to stable NA was  transferred to [facility] ward at [facility].  Per patient she states s/p taking her blood pressure medication and seroquel she began to feel dizzy and states that she felt as if she would pass out. Per RN noted patient at that time has low bp  78/56 HR 109. ?Patient per notes was also complaining of anxiety. Thereafter patient lost her balance and has a presyncope episode. During episode patient per staff was note to have twitching episode. Per patient the tremors she was experienced at that time is  typical of her known myoclonus.  In any event due lower blood pressure s/p medication and noted myoclonus psych preferred to have patient transfer to medicine service for stabilization. Patient currently states she feels back her normal self . She denies any fever /chills/ n/v/d/ abdominal pain/cp or sob.  She does however note her anxiety and depression  have increased and she does continue endorse SI. She however notes no current plan. ? ?Assessment/plan ?Near Syncope, due to med  side effect, it could be due to Seroquel as per patient which she refused to take.  Blood pressure was low which has been improved after IV fluid given for hydration.  Today blood pressure improved.  Patient remained asymptomatic, ambulated well without any symptoms.  Resumed Sartin 50 mg p.o. daily home dose.  Monitor BP and titrate medications accordingly. ?Myoclonus , -it appears that patient did not have seizures, chronic issue for which she was placed on tegretol.  It might be causing hyponatremia.  Continue to monitor CBC and BMP ?Mild hyponatremia, s/p NS IV fluid given for hydration, Na 131 stable.  Repeat BMP and serum osmolality after 1 week.  Continue fluid restriction and regular diet ?Anxiety/Depression/SI, continue on one to one sitter, continued Cymbalta 10 mg home dose.  Transferred to [facility] psych for further management. ?Body mass index is 24.71 kg/m?Marland Kitchen  ?Nutrition Interventions: ?Patient was ambulatory without any assistance. ?On the day of the discharge the patient's vitals were stable, and no other acute medical condition were reported by patient. the patient was felt  safe to be discharge at [facility] psych unit ? ?Consultants: Psych ?Procedures: None ? ?Discharge Exam: ?General: Appear in no distress, no Rash; Oral Mucosa Clear, moist. ?Cardiovascular: S1 and S2 Present, no Murmur, ?Respiratory: normal respiratory effort, Bilateral Air entry present and no Crackles, no wheezes ?Abdomen: Bowel Sound present, Soft and no  tenderness, no hernia ?Extremities: no Pedal edema, no calf tenderness ?Neurology: alert and oriented to time, place, and person ?affect appropriate. ? ?Filed Weights  ? 10/10/21 0800  ?Weight: 65.3 kg  ? ?Vitals:  ? 10/10/21 1307 10/10/21 1311  ?BP: (!) 151/102 137/90  ?Pulse: 100 (!) 102  ?Resp: 18 17  ?Temp:    ?SpO2: 100% 100%  ? ? ?DISCHARGE MEDICATION: ?Allergies as of 10/10/2021   ? ?   Reactions  ? Amlodipine Other (See Comments)  ? Flushing, tingling in face, sore throat ?(Norvasc)  ? Aripiprazole Other (See Comments)  ? Cyclobenzaprine Hcl Other (See Comments)  ? Seizures  ? Cymbalta [duloxetine Hcl] Other (See Comments)  ? "Made the depression worse"  ? Lamictal [lamotrigine] Other (See Comments)  ? Constipation and irritability (in the level/dose that actually might help)  ? Other Other (See Comments)  ? Sertraline Diarrhea  ? Trazodone Other (See Comments)  ? Nightmares  ? Tape Rash  ? Blisters  ? ?  ? ?  ?Medication List  ?  ? ?TAKE these medications   ? ?carbamazepine 100 MG chewable tablet ?Commonly known as: TEGRETOL ?Chew 0.5 tablets (50 mg total) by mouth daily with supper. ?  ?levothyroxine 100 MCG tablet ?Commonly known as: SYNTHROID ?Take 1 tablet (100 mcg total) by mouth daily. ?  ?Lexapro 10 MG tablet ?Generic drug: escitalopram ?Take 0.5 tablets (5 mg total) by mouth daily. ?  ?losartan 50 MG tablet ?Commonly known as: COZAAR ?Take 1 tablet (50 mg total) by mouth daily. ?  ?multivitamin tablet ?Take 1 tablet by mouth daily. ?  ? ?  ? ?Allergies  ?Allergen Reactions  ? Amlodipine Other (See Comments)  ?  Flushing, tingling in face, sore throat ? ?(Norvasc)  ? Aripiprazole Other (See Comments)  ? Cyclobenzaprine Hcl Other (See Comments)  ?  Seizures ?  ? Cymbalta [Duloxetine Hcl] Other (See Comments)  ?  "Made the depression worse"  ? Lamictal [Lamotrigine] Other (See Comments)  ?  Constipation and irritability (in the level/dose that actually might help)  ? Other Other (See Comments)  ?  Sertraline Diarrhea  ? Trazodone Other (See Comments)  ?  Nightmares ?  ? Tape Rash  ?  Blisters  ? ?Discharge Instructions   ? ? Diet - low sodium heart healthy   Complete by: As directed ?  ? Discharge instructions   Complete by: As directed ?  ? Follow with PCP, patient should be seen by an MD in 1 to 2 days, continue to monitor BP and titrate medications accordingly. Check sodium level after 1 week and check serum osmolarity as well.  ? Increase activity slowly   Complete by: As directed ?  ? ?  ? ? ?The results of significant diagnostics from this hospitalization (including imaging, microbiology, ancillary and laboratory) are listed below for reference.   ? ?Significant Diagnostic Studies: ?No results found. ? ?Microbiology: ?Recent Results (from the past 240 hour(s))  ?Resp Panel by RT-PCR (Flu A&B, Covid) Nasopharyngeal Swab     Status: None  ? Collection Time: 10/06/21  1:50 PM  ? Specimen: Nasopharyngeal Swab; Nasopharyngeal(NP) swabs in vial transport medium  ?Result  Value Ref Range Status  ? SARS Coronavirus 2 by RT PCR NEGATIVE NEGATIVE Final  ?  Comment: (NOTE) ?SARS-CoV-2 target nucleic acids are NOT DETECTED. ? ?The SARS-CoV-2 RNA is generally detectable in upper respiratory ?specimens during the acute phase of infection. The lowest ?concentration of SARS-CoV-2 viral copies this assay can detect is ?138 copies/mL. A negative result does not preclude SARS-Cov-2 ?infection and should not be used as the sole basis for treatment or ?other patient management decisions. A negative result may occur with  ?improper specimen collection/handling, submission of specimen other ?than nasopharyngeal swab, presence of viral mutation(s) within the ?areas targeted by this assay, and inadequate number of viral ?copies(<138 copies/mL). A negative result must be combined with ?clinical observations, patient history, and epidemiological ?information. The expected result is Negative. ? ?Fact Sheet for Patients:   ?BloggerCourse.com ? ?Fact Sheet for Healthcare Providers:  ?SeriousBroker.it ? ?This test is no t yet approved or cleared by the [place] and  ?has been authorized fo

## 2021-10-10 NOTE — Progress Notes (Addendum)
Patient presented at nurses station pale and unsteady stating, "I think I'm about to pass out."  Staff assisted pt to floor at which time pt began shaking and contracting with tremors.  Patient stated, "These aren't seizures.  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  Pt maintained consciousness throughout event.  BP 150/128, HR 122, R 20, Sat 94% on RA. Rapid response called. BG checked - 99.  Assisted pt to bed while awaiting RR team.  ?

## 2021-10-10 NOTE — Progress Notes (Signed)
Patient admitted voluntarily to [facility] from [facility] ICU with diagnosis of  Major depressive disorder. Patient verbalizes suicidal thoughts with no plan in place and verbally contracts for safety on the unit. Patient presents to unit ambulatory, alert and oriented x4.  Patient is able to state why she is here "I need help with these suicidal thoughts." Patient's affect is anxious and  patient endorses depression and anxiety 10/10. Patient denies HI, AVH and pain. Patient states that her health issues are her stressors and that everything is overwhelming. Patient reports fair appetite. Denies incontinence. Patient denies smoking, drug abuse and ETOH use. Patient reports living alone, with her daughters and friends as support system. Patient is divorced. When asked what her strengths are or the goals she would like to work on while here pt states, "my strengths are my friendships, my courage and my children. My goals while here is to learn how to deal with these intrusive feelings (suicidal thoughts)."   ?  ?Emotional support and reassurance provided throughout admission intake. Afterwards, oriented patient to unit, room and call light, reviewed POC with all questions answered and understanding verbalzied. Pt compliant with all meds, swallows without difficulty and ate 50% of dinner. Patient is placed on a low risk for falls.  Denies any needs at this time.  Will continue to monitor with ongoing Q 15 minute safety checks per unit protocol.  ?

## 2021-10-10 NOTE — Progress Notes (Signed)
Security called for transport of patient to geropsych. ?

## 2021-10-10 NOTE — Tx Team (Signed)
Initial Treatment Plan ?10/10/2021 ?6:20 PM ?Aqueelah Cotrell Bhakta ?GGY:[id number] ? ? ? ?PATIENT STRESSORS: ?Health problems   ? ? ?PATIENT STRENGTHS: ?Ability for insight  ?Supportive family/friends  ? ? ?PATIENT IDENTIFIED PROBLEMS: ?States everything is just overwhelming.  ?  ?  ?  ?  ?  ?  ?  ?  ?  ? ?DISCHARGE CRITERIA:  ?Ability to meet basic life and health needs ?Improved stabilization in mood, thinking, and/or behavior ? ?PRELIMINARY DISCHARGE PLAN: ?Return to previous living arrangement ? ?PATIENT/FAMILY INVOLVEMENT: ?This treatment plan has been presented to and reviewed with the patient, Alysiana Ethridge. The patient has been given the opportunity to ask questions and make suggestions. ? ?Doneen Poisson, RN ?10/10/2021, 6:20 PM ?

## 2021-10-10 NOTE — Group Note (Deleted)
LCSW Group Therapy Note ? ?Group Date: 10/10/2021 ?Start Time:  1:00 PM ?End Time:  2:00 PM ? ? ?Type of Therapy and Topic:  Group Therapy - Healthy vs Unhealthy Coping Skills ? ?Participation Level:  Did Not Attend  ? ?Description of Group ?The focus of this group was to determine what unhealthy coping techniques typically are used by group members and what healthy coping techniques would be helpful in coping with various problems. Patients were guided in becoming aware of the differences between healthy and unhealthy coping techniques. Patients were asked to identify 2-3 healthy coping skills they would like to learn to use more effectively. ? ?Therapeutic Goals ?Patients learned that coping is what human beings do all day long to deal with various situations in their lives ?Patients defined and discussed healthy vs unhealthy coping techniques ?Patients identified their preferred coping techniques and identified whether these were healthy or unhealthy ?Patients determined 2-3 healthy coping skills they would like to become more familiar with and use more often. ?Patients provided support and ideas to each other ? ? ?Summary of Patient Progress: Due to limited staffing, group was not held on the unit.  ? ? ?Therapeutic Modalities ?Cognitive Behavioral Therapy ?Motivational Interviewing ? ?Ileana Ladd [place], LCSWA ?10/10/2021  4:29 PM   ?

## 2021-10-10 NOTE — Plan of Care (Signed)
  Problem: Education: Goal: Knowledge of [place] [facility] Education information/materials will improve Outcome: Adequate for Discharge Goal: Emotional status will improve Outcome: Adequate for Discharge Goal: Mental status will improve Outcome: Adequate for Discharge Goal: Verbalization of understanding the information provided will improve Outcome: Adequate for Discharge   Problem: Activity: Goal: Interest or engagement in activities will improve Outcome: Adequate for Discharge Goal: Sleeping patterns will improve Outcome: Adequate for Discharge   Problem: Coping: Goal: Ability to verbalize frustrations and anger appropriately will improve Outcome: Adequate for Discharge Goal: Ability to demonstrate self-control will improve Outcome: Adequate for Discharge   Problem: Health Behavior/Discharge Planning: Goal: Identification of resources available to assist in meeting health care needs will improve Outcome: Adequate for Discharge Goal: Compliance with treatment plan for underlying cause of condition will improve Outcome: Adequate for Discharge   Problem: Physical Regulation: Goal: Ability to maintain clinical measurements within normal limits will improve Outcome: Adequate for Discharge   Problem: Safety: Goal: Periods of time without injury will increase Outcome: Adequate for Discharge   

## 2021-10-10 NOTE — Progress Notes (Addendum)
Patient c/o of tachycardia and muscle spasms.  "I just don't feel right after taking that new medicine".  Patient given Seroquel first dose an hour before complaint.  Writer did assess patient b/p 78/56 HR 109.  Patient c/o anxiety.  Upon pulling Ativan to manage symptoms nursing staff informed writer pt had loss her balance and became unsteady falling to floor.  Nurse tech did grab patient and assist patient to floor. Patient observed having seizure like activity for around 45 seconds with muscle spasms and rt arm contracture during episode.  Pt assisted to wheel chair and returned to assigned room where she was assisted to bed.  Pt denies injury/pain. On Site APP notified. Nursing staff continue to monitor patient.    ?

## 2021-10-10 NOTE — Progress Notes (Signed)
Accompanied patient to geropsych, patient continues in stable condition. ?

## 2021-10-10 NOTE — Plan of Care (Signed)
Continuing with plan of care. 

## 2021-10-10 NOTE — Progress Notes (Incomplete)
Received patient from geripsyche floor, no obvious distress, able to answer all questions appropriately, states has had muscle tremors for years and is seeing a MD outpatient for same no history of seiz ?

## 2021-10-11 MED ORDER — HYDROXYZINE HCL 25 MG PO TABS
50.0000 mg | ORAL_TABLET | Freq: Four times a day (QID) | ORAL | Status: DC | PRN
  Administered 2021-10-11 – 2021-10-13 (×6): 50 mg via ORAL
  Filled 2021-10-11 (×7): qty 2

## 2021-10-11 MED ORDER — CLONAZEPAM 0.5 MG PO TABS
0.5000 mg | ORAL_TABLET | Freq: Three times a day (TID) | ORAL | Status: DC
  Administered 2021-10-11 – 2021-10-27 (×50): 0.5 mg via ORAL
  Filled 2021-10-11 (×50): qty 1

## 2021-10-11 NOTE — Group Note (Signed)
LCSW Group Therapy Note ? ?Group Date: 10/11/2021 ?Start Time: 1300 ?End Time: 1350 ? ? ?Type of Therapy and Topic:  Group Therapy - Healthy vs Unhealthy Coping Skills ? ?Participation Level:  Active  ? ?Description of Group ?The focus of this group was to determine what unhealthy coping techniques typically are used by group members and what healthy coping techniques would be helpful in coping with various problems. Patients were guided in becoming aware of the differences between healthy and unhealthy coping techniques. Patients were asked to identify 2-3 healthy coping skills they would like to learn to use more effectively. ? ?Therapeutic Goals ?Patients learned that coping is what human beings do all day long to deal with various situations in their lives ?Patients defined and discussed healthy vs unhealthy coping techniques ?Patients identified their preferred coping techniques and identified whether these were healthy or unhealthy ?Patients determined 2-3 healthy coping skills they would like to become more familiar with and use more often. ?Patients provided support and ideas to each other ? ? ?Summary of Patient Progress: Patient was present for the entirety of the group session. Patient was an active listener and participated in the topic of discussion. Patient presented with a depressed affect and was observed closing her eyes throughout group. Patient identified "sitting in my head" and isolating as unhealthy coping skills she has used in the past. Patient stated that she would like to begin walking again and shared that there are parks within driving distance from her house. ? ? ?Therapeutic Modalities ?Cognitive Behavioral Therapy ?Motivational Interviewing ? ?Ileana Ladd [place], LCSWA ?10/11/2021  3:29 PM   ?

## 2021-10-11 NOTE — H&P (Signed)
Psychiatric Admission Assessment Adult ? ?Patient Identification: Colleen [name] ?MRN:  [id number] ?Date of Evaluation:  10/11/2021 ?Chief Complaint:  Major depressive disorder, recurrent severe without psychotic features ([place]) [F33.2] ?Principal Diagnosis: Major depressive disorder, recurrent severe without psychotic features ([place]) ?Diagnosis:  Principal Problem: ?  Major depressive disorder, recurrent severe without psychotic features ([place]) ? ?History of Present Illness: Patient was initially admitted voluntarily to [facility] from [facility] ED with diagnosis of  Major depressive disorder. Patient verbalizes suicidal thoughts with no plan in place and verbally contracts for safety on the unit. Patient presents to unit ambulatory, alert and oriented x4.  Patient is able to state why she is here "I need help with these suicidal thoughts." Patient's affect is extremely anxious and  patient endorses depression and anxiety 10/10. Patient denies HI, AVH and pain. Patient states that her health issues are her stressors. Patient reports fair appetite. Denies incontinence and reports last BM 10/07/2021  Patient denies smoking, drug abuse and ETOH use. Patient reports living alone, with her daughters and friends as support system. Patient is divorced. When asked what her strengths are or the goals she would like to work on while here pt states, "my strengths are my friendships, my courage and my children.  ?Later that day, Patient c/o of tachycardia and muscle spasms.  "I just don't feel right after taking that new medicine".  Patient given Seroquel first dose an hour before complaint.  Writer did assess patient b/p 78/56 HR 109.  Patient c/o anxiety.  Upon pulling Ativan to manage symptoms nursing staff informed writer pt had loss her balance and became unsteady falling to floor.  Nurse tech did grab patient and assist patient to floor. Patient observed having seizure like activity for around 45 seconds with muscle  spasms and rt arm contracture during episode. She was transferred to medical floor for observation and was eventually sent back last night.  ? Patient evaluated in her room today; she reports feeling depressed, anxious, has intrusive thoughts and suicidal ideations without a plan. She is denying any HI or any AVH. She had diffiuculty sleeping last night and has fair appetite. She reports along H/O depression and anxiety and her Lexapro dose was recently increased. She is agreeable to switching Xanax to Klonopin.  ?Associated Signs/Symptoms: ?Depression Symptoms:  depressed mood, ?insomnia, ?psychomotor retardation, ?difficulty concentrating, ?suicidal thoughts without plan, ?anxiety, ?loss of energy/fatigue, ?disturbed sleep, ?Duration of Depression Symptoms: Greater than two weeks ? ?(Hypo) Manic Symptoms:  Irritable Mood, ?Anxiety Symptoms:  Excessive Worry, ?Psychotic Symptoms:     ?PTSD Symptoms: ?Negative ?Total Time spent with patient: 1 hour ? ?Past Psychiatric History: MDD, GAD ? ?Is the patient at risk to self? Yes.    ?Has the patient been a risk to self in the past 6 months? Yes.    ?Has the patient been a risk to self within the distant past? Yes.    ?Is the patient a risk to others? No.  ?Has the patient been a risk to others in the past 6 months? No.  ?Has the patient been a risk to others within the distant past? No.  ? ?Prior Inpatient Therapy:   ?Prior Outpatient Therapy:   ? ?Alcohol Screening: 1. How often do you have a drink containing alcohol?: Never ?2. How many drinks containing alcohol do you have on a typical day when you are drinking?: 1 or 2 ?3. How often do you have six or more drinks on one occasion?: Never ?AUDIT-C Score: 0 ?  4. How often during the last year have you found that you were not able to stop drinking once you had started?: Never ?5. How often during the last year have you failed to do what was normally expected from you because of drinking?: Never ?6. How often during the  last year have you needed a first drink in the morning to get yourself going after a heavy drinking session?: Never ?7. How often during the last year have you had a feeling of guilt of remorse after drinking?: Never ?8. How often during the last year have you been unable to remember what happened the night before because you had been drinking?: Never ?9. Have you or someone else been injured as a result of your drinking?: No ?10. Has a relative or friend or a doctor or another health worker been concerned about your drinking or suggested you cut down?: No ?Alcohol Use Disorder Identification Test Final Score (AUDIT): 0 ?Substance Abuse History in the last 12 months:  No. ?Consequences of Substance Abuse: ?NA ?Previous Psychotropic Medications: Yes  ?Psychological Evaluations: Yes  ?Past Medical History:  ?Past Medical History:  ?Diagnosis Date  ? Allergy   ? Anxiety   ? Arrhythmia   ? "does not see cardiologist, sees pcp, Dr. Christie Nottingham  ? Asthma   ? DX IN MARCH 2012 "allergy induced asthma"  ? Bronchitis   ? hx of  ? Depression   ? "past hx of for Panic attacks, not on medication at this time"  ? Family history of anesthesia complication   ? mother "nausea and vomiting post surger"  ? GERD (gastroesophageal reflux disease)   ? Goiter 03/17/2012  ? 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 Heartburn   ? Herpes   ? on medication for outbreaks only  ? Hypertension   ? Seizures ([place])   ? MEDICATION INDUCED  ? Urinary tract infection   ? hx of  ?  ?Past Surgical History:  ?Procedure Laterality Date  ? CESAREAN SECTION    ? x 2  ? COLONOSCOPY    ? ELBOW SURGERY    ? right  ? KNEE ARTHROSCOPY    ? right  ? PILONIDAL CYST EXCISION    ? spinal cystectomy    ? "where pilonidal cyst was"  ? THYROIDECTOMY  04/07/2012  ? Procedure: THYROIDECTOMY;  Surgeon: Haywood Lasso, MD;  Location: [place];  Service: General;  Laterality: N/A;   ?  ? ?Family History:   ?Family History  ?Problem Relation Age of Onset  ? Heart failure Father   ? Hypertension Father   ? Seizures Father   ? Heart disease Father   ? Cervical cancer Mother   ? Seizures Brother   ? Irritable bowel syndrome Brother   ? Cirrhosis Brother   ? Hypertension Brother   ?     x 4  ? Fibromyalgia Sister   ? Atrial fibrillation Sister   ? Prostate cancer Brother   ? Breast cancer Neg Hx   ? ?Family Psychiatric  History: Denies ?Tobacco Screening:   ?Social History:  ?Social History  ? ?Substance and Sexual Activity  ?Alcohol Use No  ? Alcohol/week: 0.0 standard drinks  ?   ?Social History  ? ?Substance and Sexual Activity  ?Drug Use No  ?  ?Additional Social History: ?Marital status: Divorced ?Divorced, when?: 2019 ?What types of issues is patient dealing with in the relationship?: "The relationship was  breaking down, there was no communication, no intimacy." ?Are you sexually active?: No ?What is your sexual orientation?: Heterosexual ?Has your sexual activity been affected by drugs, alcohol, medication, or emotional stress?: Patient denies ?Does patient have children?: Yes ?How many children?: 2 (2 daughters) ?How is patient's relationship with their children?: One of patient's daughters lives in [place] who she does not see often. Patient reports she has a "good" relationship with her daughter who lives in [place] "but it's starting to become strained with my daughter here because of my mental health, I don't want her to get pulled down." ?   ?  ?  ?  ?  ?  ?  ?  ?  ?  ?  ? ?Allergies:   ?Allergies  ?Allergen Reactions  ? Amlodipine Other (See Comments)  ?  Flushing, tingling in face, sore throat ? ?(Norvasc)  ? Aripiprazole Other (See Comments)  ? Cyclobenzaprine Hcl Other (See Comments)  ?  Seizures ?  ? Cymbalta [Duloxetine Hcl] Other (See Comments)  ?  "Made the depression worse"  ? Lamictal [Lamotrigine] Other (See Comments)  ?  Constipation and irritability (in the level/dose that actually might  help)  ? Other Other (See Comments)  ? Sertraline Diarrhea  ? Trazodone Other (See Comments)  ?  Nightmares ?  ? Tape Rash  ?  Blisters  ? ?Lab Results:  ?Results for orders placed or performed during the hospit

## 2021-10-11 NOTE — Progress Notes (Signed)
Patient continue to endorse Passive SI with no plan. Denies HI/A/VH and verbally contracted for safety. Interacting well with Staff and Peers and visible in Milieu. Compliant with medications. Support and encouragement provided. Patient remains safe. ?
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
## 2021-10-11 NOTE — Progress Notes (Signed)
Patient started on Clonopin TID per provider orders. Continue endorsing Passive SI with no plan and tearful starting "I have been depressed for 6 weeks and I am not getting better" Patient educated on her medication and diagnosis. ? ?Support and encouragement provided.  ?

## 2021-10-11 NOTE — Plan of Care (Signed)
  Problem: Education: Goal: Knowledge of [facility] Education information/materials will improve Outcome: Progressing Goal: Emotional status will improve Outcome: Progressing Goal: Mental status will improve Outcome: Progressing Goal: Verbalization of understanding the information provided will improve Outcome: Progressing   Problem: Activity: Goal: Interest or engagement in activities will improve Outcome: Progressing   

## 2021-10-11 NOTE — Discharge Summary (Signed)
Transferredto medical unit ?

## 2021-10-12 DIAGNOSIS — F332 Major depressive disorder, recurrent severe without psychotic features: Secondary | ICD-10-CM

## 2021-10-12 LAB — GLUCOSE, CAPILLARY: Glucose-Capillary: 111 mg/dL — ABNORMAL HIGH (ref 70–99)

## 2021-10-12 MED ORDER — VENLAFAXINE HCL ER 37.5 MG PO CP24
37.5000 mg | ORAL_CAPSULE | Freq: Every day | ORAL | Status: DC
  Administered 2021-10-12 – 2021-10-13 (×2): 37.5 mg via ORAL
  Filled 2021-10-12 (×2): qty 1

## 2021-10-12 NOTE — Progress Notes (Signed)
Patient appears anxious while sitting in the dayroom. She rates anxiety as a 10/10 but cannot verbalize why she is feeling so anxious. She rates depression as a 6/10. Patient endorses passive SI. She denies HI and AVH. Patient was given hydroxyzine PRN. Patient ate breakfast and returned to her room. She interacts minimally with others. She says she did not sleep well, and that the Klonopin and Vistaril did not help. Patient remains safe on the unit at this time. ?

## 2021-10-12 NOTE — Progress Notes (Signed)
[facility] MD Progress Note ? ?10/12/2021 12:12 PM ?Colleen [name]  ?MRN:  [id number] ?Subjective: Colleen [name] is seen and examined today.  She has a long history of depression and suicidal ideation.  She has been on numerous medications including Cymbalta which she states made it worse.  She continues to complain of chronic depression and states it never gets better.  I discussed making some medication changes with her.  She is able to contract for safety while in the hospital.  She has a history of myoclonus, hypertension, insomnia, anxiety, depression, syncope (seizure disorder) and hypothyroidism. ? ?Principal Problem: Major depressive disorder, recurrent severe without psychotic features (HCC) ?Diagnosis: Principal Problem: ?  Major depressive disorder, recurrent severe without psychotic features (HCC) ? ?Total Time spent with patient: 15 minutes ? ?Past Psychiatric History: Extensive ? ?Past Medical History:  ?Past Medical History:  ?Diagnosis Date  ? Allergy   ? Anxiety   ? Arrhythmia   ? "does not see cardiologist, sees pcp, Dr. Alonza Smoker  ? Asthma   ? DX IN MARCH 2012 "allergy induced asthma"  ? Bronchitis   ? hx of  ? Depression   ? "past hx of for Panic attacks, not on medication at this time"  ? Family history of anesthesia complication   ? mother "nausea and vomiting post surger"  ? GERD (gastroesophageal reflux disease)   ? Goiter 03/17/2012  ? 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 Heartburn   ? Herpes   ? on medication for outbreaks only  ? Hypertension   ? Seizures (HCC)   ? MEDICATION INDUCED  ? Urinary tract infection   ? hx of  ?  ?Past Surgical History:  ?Procedure Laterality Date  ? CESAREAN SECTION    ? x 2  ? COLONOSCOPY    ? ELBOW SURGERY    ? right  ? KNEE ARTHROSCOPY    ? right  ? PILONIDAL CYST EXCISION    ? spinal cystectomy    ? "where pilonidal cyst was"  ? THYROIDECTOMY  04/07/2012  ? Procedure: THYROIDECTOMY;  Surgeon:  Currie Paris, MD;  Location: [facility] OR;  Service: General;  Laterality: N/A;   ?  ? ?Family History:  ?Family History  ?Problem Relation Age of Onset  ? Heart failure Father   ? Hypertension Father   ? Seizures Father   ? Heart disease Father   ? Cervical cancer Mother   ? Seizures Brother   ? Irritable bowel syndrome Brother   ? Cirrhosis Brother   ? Hypertension Brother   ?     x 4  ? Fibromyalgia Sister   ? Atrial fibrillation Sister   ? Prostate cancer Brother   ? Breast cancer Neg Hx   ? ? ?Social History:  ?Social History  ? ?Substance and Sexual Activity  ?Alcohol Use No  ? Alcohol/week: 0.0 standard drinks  ?   ?Social History  ? ?Substance and Sexual Activity  ?Drug Use No  ?  ?Social History  ? ?Socioeconomic History  ? Marital status: Divorced  ?  Spouse name: Not on file  ? Number of children: 2  ? Years of education: Not on file  ? Highest education level: Not on file  ?Occupational History  ? Occupation: Patient Care Customer Service  ?  Employer: [employer]  ?Tobacco Use  ? Smoking status: Former  ?  Types: Cigarettes  ?  Quit date: 05/01/1999  ?  Years  since quitting: 22.4  ? Smokeless tobacco: Never  ?Substance and Sexual Activity  ? Alcohol use: No  ?  Alcohol/week: 0.0 standard drinks  ? Drug use: No  ? Sexual activity: Yes  ?  Partners: Male  ?Other Topics Concern  ? Not on file  ?Social History Narrative  ? Not on file  ? ?Social Determinants of Health  ? ?Financial Resource Strain: Not on file  ?Food Insecurity: Not on file  ?Transportation Needs: Not on file  ?Physical Activity: Not on file  ?Stress: Not on file  ?Social Connections: Not on file  ? ?Additional Social History:  ?  ?  ?  ?  ?  ?  ?  ?  ?  ?  ?  ? ?Sleep: Poor ? ?Appetite:  Fair ? ?Current Medications: ?Current Facility-Administered Medications  ?Medication Dose Route Frequency Provider Last Rate Last Admin  ? alum & mag hydroxide-simeth (MAALOX/MYLANTA) 200-200-20 MG/5ML suspension 30 mL  30 mL Oral Q4H PRN Charm Rings, NP       ? carbamazepine (TEGRETOL) chewable tablet 50 mg  50 mg Oral Q supper Charm Rings, NP   50 mg at 10/11/21 1708  ? cholecalciferol (VITAMIN D3) tablet 5,000 Units  5,000 Units Oral Daily Charm Rings, NP   5,000 Units at 10/12/21 5009  ? clonazePAM (KLONOPIN) tablet 0.5 mg  0.5 mg Oral TID Danelle Earthly, Kashif   0.5 mg at 10/12/21 0944  ? escitalopram (LEXAPRO) tablet 10 mg  10 mg Oral Daily Charm Rings, NP   10 mg at 10/12/21 3818  ? hydrOXYzine (ATARAX) tablet 25 mg  25 mg Oral QHS PRN Charm Rings, NP   25 mg at 10/10/21 2204  ? hydrOXYzine (ATARAX) tablet 50 mg  50 mg Oral Q6H PRN Jearld Lesch, NP   50 mg at 10/12/21 2993  ? levothyroxine (SYNTHROID) tablet 100 mcg  100 mcg Oral Q0600 Charm Rings, NP   100 mcg at 10/12/21 0753  ? losartan (COZAAR) tablet 50 mg  50 mg Oral Daily Charm Rings, NP   50 mg at 10/12/21 7169  ? magnesium hydroxide (MILK OF MAGNESIA) suspension 30 mL  30 mL Oral Daily PRN Charm Rings, NP   30 mL at 10/10/21 2204  ? multivitamin with minerals tablet 1 tablet  1 tablet Oral Daily Charm Rings, NP   1 tablet at 10/12/21 6789  ? ondansetron (ZOFRAN) tablet 4 mg  4 mg Oral Q6H PRN Charm Rings, NP      ? Or  ? ondansetron (ZOFRAN) injection 4 mg  4 mg Intravenous Q6H PRN Charm Rings, NP      ? venlafaxine XR (EFFEXOR-XR) 24 hr capsule 37.5 mg  37.5 mg Oral QPC breakfast Sarina Ill, DO      ? ? ?Lab Results: No results found for this or any previous visit (from the past 48 hour(s)). ? ?Blood Alcohol level:  ?Lab Results  ?Component Value Date  ? ETH <10 10/08/2021  ? ETH <10 10/06/2021  ? ? ?Metabolic Disorder Labs: ?Lab Results  ?Component Value Date  ? HGBA1C 4.8 09/09/2021  ? MPG 91.06 09/09/2021  ? ?No results found for: PROLACTIN ?Lab Results  ?Component Value Date  ? CHOL 165 09/09/2021  ? TRIG 136 09/09/2021  ? HDL 71 09/09/2021  ? CHOLHDL 2.3 09/09/2021  ? VLDL 27 09/09/2021  ? LDLCALC 67 09/09/2021  ? LDLCALC 121 (H) 11/09/2007   ? ? ?  Physical Findings: ?AIMS:  , ,  ,  ,    ?CIWA:    ?COWS:    ? ?Musculoskeletal: ?Strength & Muscle Tone: within normal limits ?Gait & Station: normal ?Patient leans: N/A ? ?Psychiatric Specialty Exam: ? ?Presentation  ?General Appearance: Appropriate for Environment; Casual; Fairly Groomed; Neat; Well Groomed ? ?Eye Contact:Fair ? ?Speech:Slow ? ?Speech Volume:Decreased ? ?Handedness:Right ? ? ?Mood and Affect  ?Mood:Anxious; Depressed; Irritable ? ?Affect:Restricted ? ? ?Thought Process  ?Thought Processes:Coherent ? ?Descriptions of Associations:Intact ? ?Orientation:Full (Time, Place and Person) ? ?Thought Content:Logical ? ?History of Schizophrenia/Schizoaffective disorder:No ? ?Duration of Psychotic Symptoms:No data recorded ?Hallucinations:Hallucinations: None ? ?Ideas of Reference:None ? ?Suicidal Thoughts:Suicidal Thoughts: Yes, Passive ?SI Passive Intent and/or Plan: Without Plan ? ?Homicidal Thoughts:Homicidal Thoughts: No ? ? ?Sensorium  ?Memory:Immediate Fair; Remote Fair ? ?Judgment:Fair ? ?Insight:Fair ? ? ?Executive Functions  ?Concentration:Fair ? ?Attention Span:Fair ? ?Recall:Fair ? ?Fund of Knowledge:Fair ? ?Language:Fair ? ? ?Psychomotor Activity  ?Psychomotor Activity:Psychomotor Activity: Normal ? ? ?Assets  ?Assets:Communication Skills; Desire for Improvement; Financial Resources/Insurance; Housing; Social Support ? ? ?Sleep  ?Sleep:Sleep: Fair ? ? ? ?Physical Exam: ?Physical Exam ?Vitals and nursing note reviewed.  ?Constitutional:   ?   Appearance: Normal appearance. She is normal weight.  ?Neurological:  ?   General: No focal deficit present.  ?   Mental Status: She is alert and oriented to person, place, and time.  ?Psychiatric:     ?   Mood and Affect: Mood normal.     ?   Behavior: Behavior normal.  ? ?Review of Systems  ?Constitutional: Negative.   ?HENT: Negative.    ?Eyes: Negative.   ?Respiratory: Negative.    ?Cardiovascular: Negative.   ?Gastrointestinal: Negative.    ?Genitourinary: Negative.   ?Musculoskeletal: Negative.   ?Skin: Negative.   ?Neurological: Negative.   ?Endo/Heme/Allergies: Negative.   ?Psychiatric/Behavioral:  Positive for depression and suicidal ideas.   ?Blood pr

## 2021-10-12 NOTE — [facility] Suicide Risk Assessment (Signed)
[facility] INPATIENT:  Family/Significant Other Suicide Prevention Education ? ?Suicide Prevention Education:  ?Contact Attempts: Tressa Busman, friend, [phone] has been identified by the patient as the family member/significant other with whom the patient will be residing, and identified as the person(s) who will aid the patient in the event of a mental health crisis.  With written consent from the patient, two attempts were made to provide suicide prevention education, prior to and/or following the patient's discharge.  We were unsuccessful in providing suicide prevention education.  A suicide education pamphlet was given to the patient to share with family/significant other. ? ?Date and time of first attempt:10/12/2021 @ 4:24 PM ?Date and time of second attempt: CSW team will make additional attempts to reach collateral contact in order to complete SPE.  ? ?Durenda Hurt ?10/12/2021, 4:23 PM ?

## 2021-10-12 NOTE — Progress Notes (Signed)
Pt sitting in day room watching tv; calm, cooperative. Pt states "I'm pretty depressed. I've been pretty depressed all day." She denies pain. She endorses SI without a plan and verbally contracts for safety. She denies HI/AVH at this time. She reports that she "did not sleep well" last night. She describes her appetite as "fair" and says "Today it was not so good." Pt expresses feelings of anxiety, which she rates 8/10 and depression, which she rates 9/10; however, she states that she does not know the reason that she is depressed and says that her anxiety and depression are related. No acute distress noted. ?

## 2021-10-12 NOTE — Progress Notes (Signed)
Recreation Therapy Notes ? ?INPATIENT RECREATION THERAPY ASSESSMENT ? ?Patient Details ?Name: Novia Lansberry Laumann ?MRN: [id number] ?DOB: [birth year] ?Today's Date: 10/12/2021 ?      ?Information Obtained From: ?Patient ? ?Able to Participate in Assessment/Interview: ?Yes ? ?Patient Presentation: ?Responsive ? ?Reason for Admission (Per Patient): ?Active Symptoms ? ?Patient Stressors: ?  ? ?Coping Skills:   ?Meditate, Deep Breathing, Art ? ?Leisure Interests (2+):  ?Individual - Reading, Games - Word-search, Individual - TV ? ?Frequency of Recreation/Participation: ?Weekly ? ?Awareness of Community Resources:  ?Yes ? ?Community Resources:  ?Park ? ?Current Use: ?Yes ? ?If no, Barriers?: ?  ? ?Expressed Interest in [employer] Information: ?Yes ? ?[place] of Residence:  ?[place] ? ?Patient Main Form of Transportation: ?Car ? ?Patient Strengths:  ?Ruthine Dose ? ?Patient Identified Areas of Improvement:  ?Believe in myself ? ?Patient Goal for Hospitalization:  ?Learn how to deal with these intrusive thoughts ? ?Current SI (including self-harm):  ?No ? ?Current HI:  ?No ? ?Current AVH: ?No ? ?Staff Intervention Plan: ?Group Attendance, Collaborate with Interdisciplinary Treatment Team ? ?Consent to Intern Participation: ?N/A ? ?Lamone Ferrelli ?10/12/2021, 3:14 PM ?

## 2021-10-12 NOTE — Progress Notes (Addendum)
Recreation Therapy Notes ? ? ?Date: 10/12/2021 ? ?Time: 1:35 pm   ? ?Location: Courtyard  ? ?Behavioral response: Appropriate ? ?Intervention Topic: Emotions   ? ?Discussion/Intervention:  ?Group content on today was focused on emotions. The group identified what emotions are and why it is important to have emotions. Patients expressed some positive and negative emotions. Individuals gave some past experiences on how they normally dealt with emotions in the past. The group described some positive ways to deal with emotions in the future. Patients participated in the intervention ?The Situation? where individuals were given a chance to respond to certain situations involving their emotions.  ?Clinical Observations/Feedback: ?Patient came to group and was focused on the topic at hand. She expressed that she experienced many emotions before coming to the unit. Participant expressed that she would like to learn ways to deal with her emotions and intrusive thoughts. Individual was social with staff while participating in the intervention.    ?Colleen [name] LRT/CTRS  ? ? ? ? ? ? ? ?Colleen [name] ?10/12/2021 3:11 PM ?

## 2021-10-12 NOTE — BH IP Treatment Plan (Signed)
Interdisciplinary Treatment and Diagnostic Plan Update ? ?10/12/2021 ?Time of Session: 6283 ?[place] ?MRN: [id number] ? ?Principal Diagnosis: Major depressive disorder, recurrent severe without psychotic features ([place]) ? ?Secondary Diagnoses: Principal Problem: ?  Major depressive disorder, recurrent severe without psychotic features ([place]) ? ? ?Current Medications:  ?Current Facility-Administered Medications  ?Medication Dose Route Frequency Provider Last Rate Last Admin  ? alum & mag hydroxide-simeth (MAALOX/MYLANTA) 200-200-20 MG/5ML suspension 30 mL  30 mL Oral Q4H PRN Patrecia Pour, NP      ? carbamazepine (TEGRETOL) chewable tablet 50 mg  50 mg Oral Q supper Patrecia Pour, NP   50 mg at 10/11/21 1708  ? cholecalciferol (VITAMIN D3) tablet 5,000 Units  5,000 Units Oral Daily Patrecia Pour, NP   5,000 Units at 10/12/21 3710  ? clonazePAM (KLONOPIN) tablet 0.5 mg  0.5 mg Oral TID Wynell Balloon, Kashif   0.5 mg at 10/12/21 0944  ? escitalopram (LEXAPRO) tablet 10 mg  10 mg Oral Daily Patrecia Pour, NP   10 mg at 10/12/21 6269  ? hydrOXYzine (ATARAX) tablet 25 mg  25 mg Oral QHS PRN Patrecia Pour, NP   25 mg at 10/10/21 2204  ? hydrOXYzine (ATARAX) tablet 50 mg  50 mg Oral Q6H PRN Deloria Lair, NP   50 mg at 10/12/21 4854  ? levothyroxine (SYNTHROID) tablet 100 mcg  100 mcg Oral Q0600 Patrecia Pour, NP   100 mcg at 10/12/21 0753  ? losartan (COZAAR) tablet 50 mg  50 mg Oral Daily Patrecia Pour, NP   50 mg at 10/12/21 6270  ? magnesium hydroxide (MILK OF MAGNESIA) suspension 30 mL  30 mL Oral Daily PRN Patrecia Pour, NP   30 mL at 10/10/21 2204  ? multivitamin with minerals tablet 1 tablet  1 tablet Oral Daily Patrecia Pour, NP   1 tablet at 10/12/21 3500  ? ondansetron (ZOFRAN) tablet 4 mg  4 mg Oral Q6H PRN Patrecia Pour, NP      ? Or  ? ondansetron (ZOFRAN) injection 4 mg  4 mg Intravenous Q6H PRN Patrecia Pour, NP      ? venlafaxine XR (EFFEXOR-XR) 24 hr capsule 37.5 mg  37.5 mg Oral  QPC breakfast Parks Ranger, DO   37.5 mg at 10/12/21 1415  ? ?PTA Medications: ?Medications Prior to Admission  ?Medication Sig Dispense Refill Last Dose  ? carbamazepine (TEGRETOL) 100 MG chewable tablet Chew 0.5 tablets (50 mg total) by mouth daily with supper. 60 tablet 0   ? escitalopram (LEXAPRO) 10 MG tablet Take 0.5 tablets (5 mg total) by mouth daily. 7 tablet 0   ? levothyroxine (SYNTHROID) 100 MCG tablet Take 1 tablet (100 mcg total) by mouth daily. 90 tablet 1   ? losartan (COZAAR) 50 MG tablet Take 1 tablet (50 mg total) by mouth daily.     ? Multiple Vitamin (MULTIVITAMIN) tablet Take 1 tablet by mouth daily.     ? ? ?Patient Stressors: Health problems   ? ?Patient Strengths: Ability for insight  ?Supportive family/friends  ? ?Treatment Modalities: Medication Management, Group therapy, Case management,  ?1 to 1 session with clinician, Psychoeducation, Recreational therapy. ? ? ?Physician Treatment Plan for Primary Diagnosis: Major depressive disorder, recurrent severe without psychotic features ([place]) ?Long Term Goal(s): Improvement in symptoms so as ready for discharge  ? ?Short Term Goals: Ability to maintain clinical measurements within normal limits will improve ?Compliance with prescribed medications will improve ?Ability  to identify triggers associated with substance abuse/mental health issues will improve ?Ability to verbalize feelings will improve ?Ability to disclose and discuss suicidal ideas ?Ability to identify and develop effective coping behaviors will improve ? ?Medication Management: Evaluate patient's response, side effects, and tolerance of medication regimen. ? ?Therapeutic Interventions: 1 to 1 sessions, Unit Group sessions and Medication administration. ? ?Evaluation of Outcomes: Not Met ? ?Physician Treatment Plan for Secondary Diagnosis: Principal Problem: ?  Major depressive disorder, recurrent severe without psychotic features ([place]) ? ?Long Term Goal(s): Improvement in  symptoms so as ready for discharge  ? ?Short Term Goals: Ability to maintain clinical measurements within normal limits will improve ?Compliance with prescribed medications will improve ?Ability to identify triggers associated with substance abuse/mental health issues will improve ?Ability to verbalize feelings will improve ?Ability to disclose and discuss suicidal ideas ?Ability to identify and develop effective coping behaviors will improve    ? ?Medication Management: Evaluate patient's response, side effects, and tolerance of medication regimen. ? ?Therapeutic Interventions: 1 to 1 sessions, Unit Group sessions and Medication administration. ? ?Evaluation of Outcomes: Not Met ? ? ?RN Treatment Plan for Primary Diagnosis: Major depressive disorder, recurrent severe without psychotic features ([place]) ?Long Term Goal(s): Knowledge of disease and therapeutic regimen to maintain health will improve ? ?Short Term Goals: Ability to remain free from injury will improve, Ability to verbalize frustration and anger appropriately will improve, Ability to demonstrate self-control, Ability to participate in decision making will improve, Ability to verbalize feelings will improve, Ability to disclose and discuss suicidal ideas, Ability to identify and develop effective coping behaviors will improve, and Compliance with prescribed medications will improve ? ?Medication Management: RN will administer medications as ordered by provider, will assess and evaluate patient's response and provide education to patient for prescribed medication. RN will report any adverse and/or side effects to prescribing provider. ? ?Therapeutic Interventions: 1 on 1 counseling sessions, Psychoeducation, Medication administration, Evaluate responses to treatment, Monitor vital signs and CBGs as ordered, Perform/monitor CIWA, COWS, AIMS and Fall Risk screenings as ordered, Perform wound care treatments as ordered. ? ?Evaluation of Outcomes: Not  Met ? ? ?LCSW Treatment Plan for Primary Diagnosis: Major depressive disorder, recurrent severe without psychotic features ([place]) ?Long Term Goal(s): Safe transition to appropriate next level of care at discharge, Engage patient in therapeutic group addressing interpersonal concerns. ? ?Short Term Goals: Engage patient in aftercare planning with referrals and resources, Increase social support, Increase ability to appropriately verbalize feelings, Increase emotional regulation, Facilitate acceptance of mental health diagnosis and concerns, Facilitate patient progression through stages of change regarding substance use diagnoses and concerns, Identify triggers associated with mental health/substance abuse issues, and Increase skills for wellness and recovery ? ?Therapeutic Interventions: Assess for all discharge needs, 1 to 1 time with Education officer, museum, Explore available resources and support systems, Assess for adequacy in community support network, Educate family and significant other(s) on suicide prevention, Complete Psychosocial Assessment, Interpersonal group therapy. ? ?Evaluation of Outcomes: Not Met ? ? ?Progress in Treatment: ?Attending groups: No. ?Participating in groups: No. ?Taking medication as prescribed: Yes. ?Toleration medication: Yes. ?Family/Significant other contact made: No, will contact:  CSW will reach collateral contact once obtained consent.  ?Patient understands diagnosis: Yes. ?Discussing patient identified problems/goals with staff: Yes. ?Medical problems stabilized or resolved: Yes. ?Denies suicidal/homicidal ideation: No. ?Issues/concerns per patient self-inventory: Yes. ?Other: none ? ?New problem(s) identified: No, Describe:  No additional concerns/problems identified at this time. Patient endorses feelings of hopelessness. Patient states she has  been acutely depressed for the past 5 weeks. States she has recently been discharged from [facility], timeline unclear.  ? ?New Short  Term/Long Term Goal(s): Patient to work towards medication management for mood stabilization; elimination of SI thoughts; development of comprehensive mental wellness plan. ? ?Patient Goals:  Patient states "I want t

## 2021-10-12 NOTE — Progress Notes (Signed)
Pt sitting in day room watching tv; calm, cooperative. Pt states that she feels "kinda flat". She currently denies pain and SI/HI/AVH. She says "not very well" when asked how she slept last night. She describes her appetite as "fair". She expresses feelings of anxiety, which she rates 4/10 and depression, which she rates 8/10 due to "still having thoughts in her head, ruminations and not trusting in myself". No acute distress noted. ?

## 2021-10-12 NOTE — Group Note (Addendum)
[place] LCSW Group Therapy Note ? ? ? ?Group Date: 10/12/2021 ?Start Time: 1430 ?End Time: [id number] ? ?Type of Therapy and Topic:  Group Therapy:  Overcoming Obstacles ? ?Participation Level:  [facility] PARTICIPATION LEVEL: Active ? ?Mood: Euthymic  ? ?Description of Group:   ?In this group patients will be encouraged to explore what they see as obstacles to their own wellness and recovery. They will be guided to discuss their thoughts, feelings, and behaviors related to these obstacles. The group will process together ways to cope with barriers, with attention given to specific choices patients can make. Each patient will be challenged to identify changes they are motivated to make in order to overcome their obstacles. This group will be process-oriented, with patients participating in exploration of their own experiences as well as giving and receiving support and challenge from other group members. ? ?Therapeutic Goals: ?1. Patient will identify personal and current obstacles as they relate to admission. ?2. Patient will identify barriers that currently interfere with their wellness or overcoming obstacles.  ?3. Patient will identify feelings, thought process and behaviors related to these barriers. ?4. Patient will identify two changes they are willing to make to overcome these obstacles:  ? ? ?Summary of Patient Progress ? ? ?Patient was present for the entirety of the group session. Patient was an active listener and participated in the topic of discussion, provided helpful advice to others, and added nuance to topic of conversation. Patient states her obstacles are unspecified psychosocial stressors in addition to not understanding what caused acute emotional distress. CSW encouraged patients to spend time in introspection/meditation analyzing their obstacles in attempt to better understand their circumstances.  ? ? ?Therapeutic Modalities:   ?Cognitive Behavioral  Therapy ?Solution Focused Therapy ?Motivational Interviewing ?Relapse Prevention Therapy ? ? ?Durenda Hurt, LCSWA ?

## 2021-10-12 NOTE — Progress Notes (Signed)
Recreation Therapy Notes ? ?INPATIENT RECREATION TR PLAN ? ?Patient Details ?Name: Colleen [name] ?MRN: [id number] ?DOB: 06-18-1951 ?Today's Date: 10/12/2021 ? ?Rec Therapy Plan ?Is patient appropriate for Therapeutic Recreation?: Yes ?Treatment times per week: at least 3 ?Estimated Length of Stay: 5-7 days ?TR Treatment/Interventions: Group participation (Comment) ? ?Discharge Criteria ?Pt will be discharged from therapy if:: Discharged ?Treatment plan/goals/alternatives discussed and agreed upon by:: Patient/family ? ?Discharge Summary ?  ? ? ?Eschol Auxier ?10/12/2021, 3:15 PM ?

## 2021-10-13 MED ORDER — VENLAFAXINE HCL ER 37.5 MG PO CP24
37.5000 mg | ORAL_CAPSULE | Freq: Once | ORAL | Status: AC
  Administered 2021-10-13: 37.5 mg via ORAL
  Filled 2021-10-13: qty 1

## 2021-10-13 MED ORDER — HYDROXYZINE HCL 25 MG PO TABS
50.0000 mg | ORAL_TABLET | Freq: Once | ORAL | Status: AC
  Administered 2021-10-13: 50 mg via ORAL
  Filled 2021-10-13: qty 2

## 2021-10-13 MED ORDER — HYDROXYZINE HCL 25 MG PO TABS
50.0000 mg | ORAL_TABLET | Freq: Every day | ORAL | Status: DC
  Administered 2021-10-14 – 2021-10-26 (×13): 50 mg via ORAL
  Filled 2021-10-13 (×13): qty 2

## 2021-10-13 MED ORDER — VENLAFAXINE HCL ER 75 MG PO CP24
75.0000 mg | ORAL_CAPSULE | Freq: Every day | ORAL | Status: DC
  Administered 2021-10-14 – 2021-10-15 (×2): 75 mg via ORAL
  Filled 2021-10-13 (×2): qty 1

## 2021-10-13 MED ORDER — CARBAMAZEPINE 100 MG PO CHEW
50.0000 mg | CHEWABLE_TABLET | ORAL | Status: DC
  Administered 2021-10-13 – 2021-10-25 (×25): 50 mg via ORAL
  Filled 2021-10-13 (×27): qty 0.5

## 2021-10-13 NOTE — [facility] Group Notes (Addendum)
[facility] Group Notes:  (Nursing/MHT/Case Management/Adjunct) ? ?Date:  10/13/2021  ?Time:  10:00 AM ? ?Type of Therapy:  Psychoeducational Skills ? ?Participation Level:  Did Not Attend ? ?Participation Quality:   N/A ? ?Affect:   N/A ? ?Cognitive:   N/A ? ?Insight:  None ? ?Engagement in Group:   N/A ? ?Modes of Intervention:   N/A ? ?Summary of Progress/Problems: ?Pt refused group and stated her anxiety was bothering her. ?Barbaraann Rondo ?10/13/2021, 3:39 PM ?

## 2021-10-13 NOTE — Progress Notes (Signed)
[facility] MD Progress Note ? ?10/13/2021 11:18 AM ?Colleen [name]  ?MRN:  [id number] ?Subjective: And states that she slept well last night with Klonopin and Atarax.  She is still very depressed and anxious.  She takes Tegretol for myoclonus.  It does not make her tired.  We discussed taking it twice a day because it can help with anxiety and mood also.  She is okay with me discontinuing Lexapro and increasing Effexor.  He is able to contract for safety in the hospital.  She denies any side effects from her medications. ? ?Principal Problem: Major depressive disorder, recurrent severe without psychotic features (HCC) ?Diagnosis: Principal Problem: ?  Major depressive disorder, recurrent severe without psychotic features (HCC) ? ?Total Time spent with patient: 15 minutes ? ?Past Psychiatric History: Colleen Husk, MD,  patient has a past psychiatric history of MDD and anxiety.  She is prescribed Xanax 0.5 mg tablets twice daily. She presented to [facility] on 09/07/2021 with increased depression and anxiety.  She was prescribed Lexapro 5 mg daily, with an appointment to see Colleen [name] for medication management on 09/12/2021.  Recently discharged from [facility] [place]. ? ?Past Medical History:  ?Past Medical History:  ?Diagnosis Date  ? Allergy   ? Anxiety   ? Arrhythmia   ? "does not see cardiologist, sees pcp, Colleen [name]  ? Asthma   ? DX IN MARCH 2012 "allergy induced asthma"  ? Bronchitis   ? hx of  ? Depression   ? "past hx of for Panic attacks, not on medication at this time"  ? Family history of anesthesia complication   ? mother "nausea and vomiting post surger"  ? GERD (gastroesophageal reflux disease)   ? Goiter 03/17/2012  ? 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 Heartburn   ? Herpes   ? on medication for outbreaks only  ? Hypertension   ? Seizures (HCC)   ? MEDICATION INDUCED  ? Urinary tract infection   ? hx of  ?  ?Past Surgical  History:  ?Procedure Laterality Date  ? CESAREAN SECTION    ? x 2  ? COLONOSCOPY    ? ELBOW SURGERY    ? right  ? KNEE ARTHROSCOPY    ? right  ? PILONIDAL CYST EXCISION    ? spinal cystectomy    ? "where pilonidal cyst was"  ? THYROIDECTOMY  04/07/2012  ? Procedure: THYROIDECTOMY;  Surgeon: Colleen Paris, MD;  Location: [facility] OR;  Service: General;  Laterality: N/A;   ?  ? ?Family History:  ?Family History  ?Problem Relation Age of Onset  ? Heart failure Father   ? Hypertension Father   ? Seizures Father   ? Heart disease Father   ? Cervical cancer Mother   ? Seizures Brother   ? Irritable bowel syndrome Brother   ? Cirrhosis Brother   ? Hypertension Brother   ?     x 4  ? Fibromyalgia Sister   ? Atrial fibrillation Sister   ? Prostate cancer Brother   ? Breast cancer Neg Hx   ? ? ?Social History:  ?Social History  ? ?Substance and Sexual Activity  ?Alcohol Use No  ? Alcohol/week: 0.0 standard drinks  ?   ?Social History  ? ?Substance and Sexual Activity  ?Drug Use No  ?  ?Social History  ? ?Socioeconomic History  ? Marital status: Divorced  ?  Spouse name: Not on  file  ? Number of children: 2  ? Years of education: Not on file  ? Highest education level: Not on file  ?Occupational History  ? Occupation: Patient Care Customer Service  ?  Employer: [employer]  ?Tobacco Use  ? Smoking status: Former  ?  Types: Cigarettes  ?  Quit date: 05/01/1999  ?  Years since quitting: 22.4  ? Smokeless tobacco: Never  ?Substance and Sexual Activity  ? Alcohol use: No  ?  Alcohol/week: 0.0 standard drinks  ? Drug use: No  ? Sexual activity: Yes  ?  Partners: Male  ?Other Topics Concern  ? Not on file  ?Social History Narrative  ? Not on file  ? ?Social Determinants of Health  ? ?Financial Resource Strain: Not on file  ?Food Insecurity: Not on file  ?Transportation Needs: Not on file  ?Physical Activity: Not on file  ?Stress: Not on file  ?Social Connections: Not on file  ? ?Additional Social History:  ?  ?  ?  ?  ?  ?  ?  ?  ?  ?  ?   ? ?Sleep: Good ? ?Appetite:  Good ? ?Current Medications: ?Current Facility-Administered Medications  ?Medication Dose Route Frequency Provider Last Rate Last Admin  ? alum & mag hydroxide-simeth (MAALOX/MYLANTA) 200-200-20 MG/5ML suspension 30 mL  30 mL Oral Q4H PRN Colleen Rings, NP      ? carbamazepine (TEGRETOL) chewable tablet 50 mg  50 mg Oral BH-[id number] Colleen Ill, DO      ? cholecalciferol (VITAMIN D3) tablet 5,000 Units  5,000 Units Oral Daily Colleen Rings, NP   5,000 Units at 10/13/21 2620  ? clonazePAM (KLONOPIN) tablet 0.5 mg  0.5 mg Oral TID Colleen [name], Colleen [name]   0.5 mg at 10/13/21 0935  ? hydrOXYzine (ATARAX) tablet 25 mg  25 mg Oral QHS PRN Colleen Rings, NP   25 mg at 10/12/21 2158  ? [START ON 10/14/2021] hydrOXYzine (ATARAX) tablet 50 mg  50 mg Oral QHS Colleen Ill, DO      ? levothyroxine (SYNTHROID) tablet 100 mcg  100 mcg Oral Q0600 Colleen Rings, NP   100 mcg at 10/13/21 3559  ? losartan (COZAAR) tablet 50 mg  50 mg Oral Daily Colleen Rings, NP   50 mg at 10/13/21 0935  ? magnesium hydroxide (MILK OF MAGNESIA) suspension 30 mL  30 mL Oral Daily PRN Colleen Rings, NP   30 mL at 10/10/21 2204  ? multivitamin with minerals tablet 1 tablet  1 tablet Oral Daily Colleen Rings, NP   1 tablet at 10/13/21 0935  ? ondansetron (ZOFRAN) tablet 4 mg  4 mg Oral Q6H PRN Colleen Rings, NP      ? Or  ? ondansetron (ZOFRAN) injection 4 mg  4 mg Intravenous Q6H PRN Colleen Rings, NP      ? venlafaxine XR (EFFEXOR-XR) 24 hr capsule 37.5 mg  37.5 mg Oral Once Colleen Ill, DO      ? [START ON 10/14/2021] venlafaxine XR (EFFEXOR-XR) 24 hr capsule 75 mg  75 mg Oral QPC breakfast Colleen Ill, DO      ? ? ?Lab Results: No results found for this or any previous visit (from the past 48 hour(s)). ? ?Blood Alcohol level:  ?Lab Results  ?Component Value Date  ? ETH <10 10/08/2021  ? ETH <10 10/06/2021  ? ? ?Metabolic Disorder Labs: ?Lab Results  ?Component Value Date   ?  HGBA1C 4.8 09/09/2021  ? MPG 91.06 09/09/2021  ? ?No results found for: PROLACTIN ?Lab Results  ?Component Value Date  ? CHOL 165 09/09/2021  ? TRIG 136 09/09/2021  ? HDL 71 09/09/2021  ? CHOLHDL 2.3 09/09/2021  ? VLDL 27 09/09/2021  ? LDLCALC 67 09/09/2021  ? LDLCALC 121 (H) 11/09/2007  ? ? ?Physical Findings: ?AIMS:  , ,  ,  ,    ?CIWA:    ?COWS:    ? ?Musculoskeletal: ?Strength & Muscle Tone: within normal limits ?Gait & Station: normal ?Patient leans: N/A ? ?Psychiatric Specialty Exam: ? ?Presentation  ?General Appearance: Appropriate for Environment; Casual; Fairly Groomed; Neat; Well Groomed ? ?Eye Contact:Fair ? ?Speech:Slow ? ?Speech Volume:Decreased ? ?Handedness:Right ? ? ?Mood and Affect  ?Mood:Anxious; Depressed; Irritable ? ?Affect:Restricted ? ? ?Thought Process  ?Thought Processes:Coherent ? ?Descriptions of Associations:Intact ? ?Orientation:Full (Time, Place and Person) ? ?Thought Content:Logical ? ?History of Schizophrenia/Schizoaffective disorder:No ? ?Duration of Psychotic Symptoms:No data recorded ?Hallucinations:No data recorded ?Ideas of Reference:None ? ?Suicidal Thoughts:No data recorded ?Homicidal Thoughts:No data recorded ? ?Sensorium  ?Memory:Immediate Fair; Remote Fair ? ?Judgment:Fair ? ?Insight:Fair ? ? ?Executive Functions  ?Concentration:Fair ? ?Attention Span:Fair ? ?Recall:Fair ? ?Fund of Knowledge:Fair ? ?Language:Fair ? ? ?Psychomotor Activity  ?Psychomotor Activity:No data recorded ? ?Assets  ?Assets:Communication Skills; Desire for Improvement; Financial Resources/Insurance; Housing; Social Support ? ? ?Sleep  ?Sleep:No data recorded ? ? ?Physical Exam: ?Physical Exam ?Vitals and nursing note reviewed.  ?Constitutional:   ?   Appearance: Normal appearance. She is normal weight.  ?Neurological:  ?   General: No focal deficit present.  ?   Mental Status: She is alert and oriented to person, place, and time.  ?Psychiatric:     ?   Attention and Perception: Attention and  perception normal.     ?   Mood and Affect: Mood is depressed. Affect is flat.     ?   Speech: Speech normal.     ?   Behavior: Behavior normal. Behavior is cooperative.     ?   Thought Content: Thought content nor

## 2021-10-13 NOTE — Progress Notes (Signed)
Recreation Therapy Notes ? ?Date: 10/13/2021 ?  ?Time: 1:05 pm    ?  ?Location: Court yard  ?  ?Behavioral response: Appropriate ?  ?Intervention Topic:  Leisure   ?  ?Discussion/Intervention:  ?Group content today was focused on leisure. The group defined what leisure is and some positive leisure activities they participate in. Individuals identified the difference between good and bad leisure. Participants expressed how they feel after participating in the leisure of their choice. The group discussed how they go about picking a leisure activity and if others are involved in their leisure activities. The patient stated how many leisure activities they have to choose from and reasons why it is important to have leisure time. Individuals participated in the intervention ?Exploration of Leisure? where they had a chance to identify new leisure activities as well as benefits of leisure. ?Clinical Observations/Feedback: ?Patient came to group late and was focused on what peers and staff had to say about leisure. Participant participated in open leisure while being appropriate with staff and peers. Individual was social with peers and staff while participating in the intervention.    ?Ronie Barnhart LRT/CTRS  ? ? ? ? ? ? ? ?Lynise Porr ?10/13/2021 2:00 PM ?

## 2021-10-13 NOTE — Plan of Care (Signed)
Patient remain alert and oriented, calm and compliant during assessment. Denies SI, HI, and AVH. Endorsed depression of 7/10 and anxiety of 8/10. Denies pain or discomfort. Ate meals in the day room among peers. Remain safe on the unit with Q15 safety check. ? ?Problem: Education: ?Goal: Knowledge of [facility] Education information/materials will improve ?Outcome: Progressing ?Goal: Emotional status will improve ?Outcome: Progressing ?Goal: Mental status will improve ?Outcome: Progressing ?Goal: Verbalization of understanding the information provided will improve ?Outcome: Progressing ?  ?Problem: Activity: ?Goal: Interest or engagement in activities will improve ?Outcome: Progressing ?Goal: Sleeping patterns will improve ?Outcome: Progressing ?  ?Problem: Coping: ?Goal: Ability to verbalize frustrations and anger appropriately will improve ?Outcome: Progressing ?Goal: Ability to demonstrate self-control will improve ?Outcome: Progressing ?  ?Problem: Health Behavior/Discharge Planning: ?Goal: Identification of resources available to assist in meeting health care needs will improve ?Outcome: Progressing ?Goal: Compliance with treatment plan for underlying cause of condition will improve ?Outcome: Progressing ?  ?Problem: Physical Regulation: ?Goal: Ability to maintain clinical measurements within normal limits will improve ?Outcome: Progressing ?  ?Problem: Safety: ?Goal: Periods of time without injury will increase ?Outcome: Progressing ?  ?

## 2021-10-13 NOTE — [facility] Suicide Risk Assessment (Signed)
[facility] INPATIENT:  Family/Significant Other Suicide Prevention Education ? ?Suicide Prevention Education:  ?Contact Attempts: Psychologist, clinical, friend(name of family member/significant other) has been identified by the patient as the family member/significant other with whom the patient will be residing, and identified as the person(s) who will aid the patient in the event of a mental health crisis.  With written consent from the patient, two attempts were made to provide suicide prevention education, prior to and/or following the patient's discharge.  We were unsuccessful in providing suicide prevention education.  A suicide education pamphlet was given to the patient to share with family/significant other. ? ?Date and time of second attempt:10/13/21/2:50PM ? ?Colleen [name] ?10/13/2021, 2:52 PM ?

## 2021-10-13 NOTE — [facility] Counselor (Signed)
CSW talked with pt about follow up at [facility], pt mistakenly referred to practice as [facility] in the notes. She stated she would need a follow up appointment due to being admitted to the hospital on her initial appointment.  ? ?CSW will assist with follow up care.  ? ?Vilma Will [place], MSW, LCSW-A ?4/4/20234:14 PM  ?

## 2021-10-14 MED ORDER — RISPERIDONE 1 MG PO TABS
0.5000 mg | ORAL_TABLET | ORAL | Status: DC
  Administered 2021-10-14 – 2021-10-16 (×4): 0.5 mg via ORAL
  Filled 2021-10-14 (×4): qty 1

## 2021-10-14 NOTE — Progress Notes (Signed)
[facility] MD Progress Note ? ?10/14/2021 2:06 PM ?Colleen [name]  ?MRN:  [id number] ?Subjective: Patient is seen on rounds.  She slept well last night.  She has been taking her medications without any problems.  She still has a lot of negative thinking and suicidal ideation.  When she was at [facility] they started her on [place] but she could not afford to get it filled and she only took it for a few days she said. She is not sure how she did on it.  I discussed starting low-dose Risperdal and how that can be helpful for her her negative thoughts but affordable.  ? ?Principal Problem: Major depressive disorder, recurrent severe without psychotic features (HCC) ?Diagnosis: Principal Problem: ?  Major depressive disorder, recurrent severe without psychotic features (HCC) ? ?Total Time spent with patient: 15 minutes ? ?Past Psychiatric History: [facility] ?[name], MD,  patient has a past psychiatric history of MDD and anxiety.  She is prescribed Xanax 0.5 mg tablets twice daily. She presented to [facility] on 09/07/2021 with increased depression and anxiety.  She was prescribed Lexapro 5 mg daily, with an appointment to see [name] for medication management on 09/12/2021.  Recently discharged from [facility] [place]. ? ?Past Medical History:  ?Past Medical History:  ?Diagnosis Date  ? Allergy   ? Anxiety   ? Arrhythmia   ? "does not see cardiologist, sees pcp, Dr. Alonza Smoker  ? Asthma   ? DX IN MARCH 2012 "allergy induced asthma"  ? Bronchitis   ? hx of  ? Depression   ? "past hx of for Panic attacks, not on medication at this time"  ? Family history of anesthesia complication   ? mother "nausea and vomiting post surger"  ? GERD (gastroesophageal reflux disease)   ? Goiter 03/17/2012  ? 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 Heartburn   ? Herpes   ? on medication for outbreaks only  ? Hypertension   ? Seizures (HCC)   ?  MEDICATION INDUCED  ? Urinary tract infection   ? hx of  ?  ?Past Surgical History:  ?Procedure Laterality Date  ? CESAREAN SECTION    ? x 2  ? COLONOSCOPY    ? ELBOW SURGERY    ? right  ? KNEE ARTHROSCOPY    ? right  ? PILONIDAL CYST EXCISION    ? spinal cystectomy    ? "where pilonidal cyst was"  ? THYROIDECTOMY  04/07/2012  ? Procedure: THYROIDECTOMY;  Surgeon: Currie Paris, MD;  Location: [facility] OR;  Service: General;  Laterality: N/A;   ?  ? ?Family History:  ?Family History  ?Problem Relation Age of Onset  ? Heart failure Father   ? Hypertension Father   ? Seizures Father   ? Heart disease Father   ? Cervical cancer Mother   ? Seizures Brother   ? Irritable bowel syndrome Brother   ? Cirrhosis Brother   ? Hypertension Brother   ?     x 4  ? Fibromyalgia Sister   ? Atrial fibrillation Sister   ? Prostate cancer Brother   ? Breast cancer Neg Hx   ? ? ?Social History:  ?Social History  ? ?Substance and Sexual Activity  ?Alcohol Use No  ? Alcohol/week: 0.0 standard drinks  ?   ?Social History  ? ?Substance and Sexual Activity  ?Drug Use No  ?  ?Social History  ? ?  Socioeconomic History  ? Marital status: Divorced  ?  Spouse name: Not on file  ? Number of children: 2  ? Years of education: Not on file  ? Highest education level: Not on file  ?Occupational History  ? Occupation: Patient Care Customer Service  ?  Employer: [employer]  ?Tobacco Use  ? Smoking status: Former  ?  Types: Cigarettes  ?  Quit date: 05/01/1999  ?  Years since quitting: 22.4  ? Smokeless tobacco: Never  ?Substance and Sexual Activity  ? Alcohol use: No  ?  Alcohol/week: 0.0 standard drinks  ? Drug use: No  ? Sexual activity: Yes  ?  Partners: Male  ?Other Topics Concern  ? Not on file  ?Social History Narrative  ? Not on file  ? ?Social Determinants of Health  ? ?Financial Resource Strain: Not on file  ?Food Insecurity: Not on file  ?Transportation Needs: Not on file  ?Physical Activity: Not on file  ?Stress: Not on file  ?Social Connections:  Not on file  ? ?Additional Social History:  ?  ?  ?  ?  ?  ?  ?  ?  ?  ?  ?  ? ?Sleep: Good ? ?Appetite:  Good ? ?Current Medications: ?Current Facility-Administered Medications  ?Medication Dose Route Frequency Provider Last Rate Last Admin  ? alum & mag hydroxide-simeth (MAALOX/MYLANTA) 200-200-20 MG/5ML suspension 30 mL  30 mL Oral Q4H PRN Charm Rings, NP   30 mL at 10/13/21 1903  ? carbamazepine (TEGRETOL) chewable tablet 50 mg  50 mg Oral BH-q8a4p Sarina Ill, DO   50 mg at 10/14/21 5053  ? cholecalciferol (VITAMIN D3) tablet 5,000 Units  5,000 Units Oral Daily Charm Rings, NP   5,000 Units at 10/14/21 9767  ? clonazePAM (KLONOPIN) tablet 0.5 mg  0.5 mg Oral TID Danelle Earthly, Kashif   0.5 mg at 10/14/21 3419  ? hydrOXYzine (ATARAX) tablet 25 mg  25 mg Oral QHS PRN Charm Rings, NP   25 mg at 10/12/21 2158  ? hydrOXYzine (ATARAX) tablet 50 mg  50 mg Oral QHS Sarina Ill, DO      ? levothyroxine (SYNTHROID) tablet 100 mcg  100 mcg Oral Q0600 Charm Rings, NP   100 mcg at 10/14/21 0551  ? losartan (COZAAR) tablet 50 mg  50 mg Oral Daily Charm Rings, NP   50 mg at 10/14/21 3790  ? magnesium hydroxide (MILK OF MAGNESIA) suspension 30 mL  30 mL Oral Daily PRN Charm Rings, NP   30 mL at 10/10/21 2204  ? multivitamin with minerals tablet 1 tablet  1 tablet Oral Daily Charm Rings, NP   1 tablet at 10/14/21 2409  ? ondansetron (ZOFRAN) tablet 4 mg  4 mg Oral Q6H PRN Charm Rings, NP      ? Or  ? ondansetron (ZOFRAN) injection 4 mg  4 mg Intravenous Q6H PRN Charm Rings, NP      ? venlafaxine XR (EFFEXOR-XR) 24 hr capsule 75 mg  75 mg Oral QPC breakfast Sarina Ill, DO   75 mg at 10/14/21 7353  ? ? ?Lab Results: No results found for this or any previous visit (from the past 48 hour(s)). ? ?Blood Alcohol level:  ?Lab Results  ?Component Value Date  ? ETH <10 10/08/2021  ? ETH <10 10/06/2021  ? ? ?Metabolic Disorder Labs: ?Lab Results  ?Component Value Date  ?  HGBA1C 4.8 09/09/2021  ? MPG  91.06 09/09/2021  ? ?No results found for: PROLACTIN ?Lab Results  ?Component Value Date  ? CHOL 165 09/09/2021  ? TRIG 136 09/09/2021  ? HDL 71 09/09/2021  ? CHOLHDL 2.3 09/09/2021  ? VLDL 27 09/09/2021  ? LDLCALC 67 09/09/2021  ? LDLCALC 121 (H) 11/09/2007  ? ? ?Physical Findings: ?AIMS:  , ,  ,  ,    ?CIWA:    ?COWS:    ? ?Musculoskeletal: ?Strength & Muscle Tone: within normal limits ?Gait & Station: normal ?Patient leans: N/A ? ?Psychiatric Specialty Exam: ? ?Presentation  ?General Appearance: Appropriate for Environment; Casual; Fairly Groomed; Neat; Well Groomed ? ?Eye Contact:Fair ? ?Speech:Slow ? ?Speech Volume:Decreased ? ?Handedness:Right ? ? ?Mood and Affect  ?Mood:Anxious; Depressed; Irritable ? ?Affect:Restricted ? ? ?Thought Process  ?Thought Processes:Coherent ? ?Descriptions of Associations:Intact ? ?Orientation:Full (Time, Place and Person) ? ?Thought Content:Logical ? ?History of Schizophrenia/Schizoaffective disorder:No ? ?Duration of Psychotic Symptoms:No data recorded ?Hallucinations:No data recorded ?Ideas of Reference:None ? ?Suicidal Thoughts:No data recorded ?Homicidal Thoughts:No data recorded ? ?Sensorium  ?Memory:Immediate Fair; Remote Fair ? ?Judgment:Fair ? ?Insight:Fair ? ? ?Executive Functions  ?Concentration:Fair ? ?Attention Span:Fair ? ?Recall:Fair ? ?Fund of Knowledge:Fair ? ?Language:Fair ? ? ?Psychomotor Activity  ?Psychomotor Activity:No data recorded ? ?Assets  ?Assets:Communication Skills; Desire for Improvement; Financial Resources/Insurance; Housing; Social Support ? ? ?Sleep  ?Sleep:No data recorded ? ? ?Physical Exam: ?Physical Exam ?Vitals and nursing note reviewed.  ?Constitutional:   ?   Appearance: Normal appearance. She is normal weight.  ?Neurological:  ?   General: No focal deficit present.  ?   Mental Status: She is alert and oriented to person, place, and time.  ?Psychiatric:     ?   Attention and Perception: Attention and perception  normal.     ?   Mood and Affect: Mood is anxious and depressed. Affect is flat.     ?   Speech: Speech normal.     ?   Behavior: Behavior normal. Behavior is cooperative.     ?   Thought Content: Thought cont

## 2021-10-14 NOTE — Progress Notes (Signed)
Pt standing at nurses' station; calm, cooperative. Pt states that she feels "still kinda flat". She currently denies pain and SI/HI/AVH. She reports "I had a good night's sleep" last night. She describes her appetite as "fair" and says "They feed [name] too close together. After I eat lunch, I'm not hungry when dinner comes." She expresses feelings of anxiety and depression due to "the fear of the thoughts" of SI. She also states "The fear exists but the danger doesn't." She reports that her anxiety "kicks up around 3 o'clock in the afternoon." No acute distress noted. ?

## 2021-10-14 NOTE — Plan of Care (Signed)
Patient is alert and oriented times 4. Mood and affect appropriate. Patient denies pain. She denies SI, HI, and AVH. Expresses feelings of anxiety and depression 4/10 at this time. States she slept good last night. Morning meds given whole by mouth W/O difficulty. Ate breakfast in day room- appetite good. Patient remains safe on unit with Q15 minute checks in place.  ? ? ?Problem: Education: ?Goal: Knowledge of [facility] Education information/materials will improve ?Outcome: Progressing ?Goal: Emotional status will improve ?Outcome: Progressing ?Goal: Mental status will improve ?Outcome: Progressing ?Goal: Verbalization of understanding the information provided will improve ?Outcome: Progressing ?  ?Problem: Activity: ?Goal: Interest or engagement in activities will improve ?Outcome: Progressing ?Goal: Sleeping patterns will improve ?Outcome: Progressing ?  ?Problem: Coping: ?Goal: Ability to verbalize frustrations and anger appropriately will improve ?Outcome: Progressing ?Goal: Ability to demonstrate self-control will improve ?Outcome: Progressing ?  ?Problem: Health Behavior/Discharge Planning: ?Goal: Identification of resources available to assist in meeting health care needs will improve ?Outcome: Progressing ?Goal: Compliance with treatment plan for underlying cause of condition will improve ?Outcome: Progressing ?  ?Problem: Physical Regulation: ?Goal: Ability to maintain clinical measurements within normal limits will improve ?Outcome: Progressing ?  ?

## 2021-10-15 MED ORDER — VENLAFAXINE HCL ER 75 MG PO CP24
112.5000 mg | ORAL_CAPSULE | Freq: Every day | ORAL | Status: DC
  Administered 2021-10-16 – 2021-10-17 (×2): 112.5 mg via ORAL
  Filled 2021-10-15 (×2): qty 1

## 2021-10-15 NOTE — Progress Notes (Signed)
[facility] MD Progress Note ? ?10/15/2021 12:51 PM ?Colleen [name]  ?MRN:  [id number] ?Subjective:Colleen [name] is seen today.  We started her on Risperdal 0.5 mg twice a day yesterday.  She got a dose at 4:00 yesterday and states that she did not have any problems with it.  She tells me today that she is having intrusive thoughts of suicide and impulsiveness.  She states that she feels like if someone gets in her way that she will just pushed him down.  She says that she will act on it but it is just a feeling she has.  She is on a decent amount of medications I think she might have a high tolerance and we may have to go up on the Risperdal instead of stopping it.  I told her we can go up on her Effexor in the morning and for her to let staff know if she has any other problems but to be patient and that we may increase the Risperdal if need be.  I would like to maximize Risperdal and Effexor before considering lithium. ? ?Principal Problem: Major depressive disorder, recurrent severe without psychotic features (HCC) ?Diagnosis: Principal Problem: ?  Major depressive disorder, recurrent severe without psychotic features (HCC) ? ?Total Time spent with patient: 15 minutes ? ?Past Psychiatric History: [facility] ?Colleen Husk, MD,  patient has a past psychiatric history of MDD and anxiety.  She is prescribed Xanax 0.5 mg tablets twice daily. She presented to [facility] on 09/07/2021 with increased depression and anxiety.  She was prescribed Lexapro 5 mg daily, with an appointment to see [name] for medication management on 09/12/2021.  Recently discharged from old [place]. ? ?Past Medical History:  ?Past Medical History:  ?Diagnosis Date  ? Allergy   ? Anxiety   ? Arrhythmia   ? "does not see cardiologist, sees pcp, Dr. Alonza Smoker  ? Asthma   ? DX IN MARCH 2012 "allergy induced asthma"  ? Bronchitis   ? hx of  ? Depression   ? "past hx of for Panic attacks, not on medication at this time"  ? Family history  of anesthesia complication   ? mother "nausea and vomiting post surger"  ? GERD (gastroesophageal reflux disease)   ? Goiter 03/17/2012  ? 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 Heartburn   ? Herpes   ? on medication for outbreaks only  ? Hypertension   ? Seizures (HCC)   ? MEDICATION INDUCED  ? Urinary tract infection   ? hx of  ?  ?Past Surgical History:  ?Procedure Laterality Date  ? CESAREAN SECTION    ? x 2  ? COLONOSCOPY    ? ELBOW SURGERY    ? right  ? KNEE ARTHROSCOPY    ? right  ? PILONIDAL CYST EXCISION    ? spinal cystectomy    ? "where pilonidal cyst was"  ? THYROIDECTOMY  04/07/2012  ? Procedure: THYROIDECTOMY;  Surgeon: Currie Paris, MD;  Location: [facility] OR;  Service: General;  Laterality: N/A;   ?  ? ?Family History:  ?Family History  ?Problem Relation Age of Onset  ? Heart failure Father   ? Hypertension Father   ? Seizures Father   ? Heart disease Father   ? Cervical cancer Mother   ? Seizures Brother   ? Irritable bowel syndrome Brother   ? Cirrhosis Brother   ? Hypertension Brother   ?  x 4  ? Fibromyalgia Sister   ? Atrial fibrillation Sister   ? Prostate cancer Brother   ? Breast cancer Neg Hx   ? ? ?Social History:  ?Social History  ? ?Substance and Sexual Activity  ?Alcohol Use No  ? Alcohol/week: 0.0 standard drinks  ?   ?Social History  ? ?Substance and Sexual Activity  ?Drug Use No  ?  ?Social History  ? ?Socioeconomic History  ? Marital status: Divorced  ?  Spouse name: Not on file  ? Number of children: 2  ? Years of education: Not on file  ? Highest education level: Not on file  ?Occupational History  ? Occupation: Patient Care Customer Service  ?  Employer: [employer]  ?Tobacco Use  ? Smoking status: Former  ?  Types: Cigarettes  ?  Quit date: 05/01/1999  ?  Years since quitting: 22.4  ? Smokeless tobacco: Never  ?Substance and Sexual Activity  ? Alcohol use: No  ?  Alcohol/week: 0.0 standard drinks  ? Drug use:  No  ? Sexual activity: Yes  ?  Partners: Male  ?Other Topics Concern  ? Not on file  ?Social History Narrative  ? Not on file  ? ?Social Determinants of Health  ? ?Financial Resource Strain: Not on file  ?Food Insecurity: Not on file  ?Transportation Needs: Not on file  ?Physical Activity: Not on file  ?Stress: Not on file  ?Social Connections: Not on file  ? ?Additional Social History:  ?  ?  ?  ?  ?  ?  ?  ?  ?  ?  ?  ? ?Sleep: Good ? ?Appetite:  Good ? ?Current Medications: ?Current Facility-Administered Medications  ?Medication Dose Route Frequency Provider Last Rate Last Admin  ? alum & mag hydroxide-simeth (MAALOX/MYLANTA) 200-200-20 MG/5ML suspension 30 mL  30 mL Oral Q4H PRN Charm RingsLord, Jamison Y, NP   30 mL at 10/13/21 1903  ? carbamazepine (TEGRETOL) chewable tablet 50 mg  50 mg Oral BH-q8a4p Sarina IllHerrick, Elayah Klooster Edward, DO   50 mg at 10/15/21 [id number]  ? cholecalciferol (VITAMIN D3) tablet 5,000 Units  5,000 Units Oral Daily Charm RingsLord, Jamison Y, NP   5,000 Units at 10/14/21 [id number]  ? clonazePAM (KLONOPIN) tablet 0.5 mg  0.5 mg Oral TID Danelle EarthlyMalik, Kashif   0.5 mg at 10/15/21 1033  ? hydrOXYzine (ATARAX) tablet 25 mg  25 mg Oral QHS PRN Charm RingsLord, Jamison Y, NP   25 mg at 10/12/21 2158  ? hydrOXYzine (ATARAX) tablet 50 mg  50 mg Oral QHS Sarina IllHerrick, Akil Hoos Edward, DO   50 mg at 10/14/21 2134  ? levothyroxine (SYNTHROID) tablet 100 mcg  100 mcg Oral Q0600 Charm RingsLord, Jamison Y, NP   100 mcg at 10/15/21 [id number]  ? losartan (COZAAR) tablet 50 mg  50 mg Oral Daily Charm RingsLord, Jamison Y, NP   50 mg at 10/15/21 1033  ? magnesium hydroxide (MILK OF MAGNESIA) suspension 30 mL  30 mL Oral Daily PRN Charm RingsLord, Jamison Y, NP   30 mL at 10/10/21 2204  ? multivitamin with minerals tablet 1 tablet  1 tablet Oral Daily Charm RingsLord, Jamison Y, NP   1 tablet at 10/15/21 1033  ? ondansetron (ZOFRAN) tablet 4 mg  4 mg Oral Q6H PRN Charm RingsLord, Jamison Y, NP      ? Or  ? ondansetron (ZOFRAN) injection 4 mg  4 mg Intravenous Q6H PRN Charm RingsLord, Jamison Y, NP      ? risperiDONE (RISPERDAL) tablet  0.5 mg  0.5 mg Oral  BH-q8a4p Sarina Ill, DO   0.5 mg at 10/15/21 5732  ? [START ON 10/16/2021] venlafaxine XR (EFFEXOR-XR) 24 hr capsule 112.5 mg  112.5 mg Oral QPC breakfast Sarina Ill, DO      ? ? ?Lab Results: No results found for this or any previous visit (from the past 48 hour(s)). ? ?Blood Alcohol level:  ?Lab Results  ?Component Value Date  ? ETH <10 10/08/2021  ? ETH <10 10/06/2021  ? ? ?Metabolic Disorder Labs: ?Lab Results  ?Component Value Date  ? HGBA1C 4.8 09/09/2021  ? MPG 91.06 09/09/2021  ? ?No results found for: PROLACTIN ?Lab Results  ?Component Value Date  ? CHOL 165 09/09/2021  ? TRIG 136 09/09/2021  ? HDL 71 09/09/2021  ? CHOLHDL 2.3 09/09/2021  ? VLDL 27 09/09/2021  ? LDLCALC 67 09/09/2021  ? LDLCALC 121 (H) 11/09/2007  ? ? ?Physical Findings: ?AIMS:  , ,  ,  ,    ?CIWA:    ?COWS:    ? ?Musculoskeletal: ?Strength & Muscle Tone: within normal limits ?Gait & Station: normal ?Patient leans: N/A ? ?Psychiatric Specialty Exam: ? ?Presentation  ?General Appearance: Appropriate for Environment; Casual; Fairly Groomed; Neat; Well Groomed ? ?Eye Contact:Fair ? ?Speech:Slow ? ?Speech Volume:Decreased ? ?Handedness:Right ? ? ?Mood and Affect  ?Mood:Anxious; Depressed; Irritable ? ?Affect:Restricted ? ? ?Thought Process  ?Thought Processes:Coherent ? ?Descriptions of Associations:Intact ? ?Orientation:Full (Time, Place and Person) ? ?Thought Content:Logical ? ?History of Schizophrenia/Schizoaffective disorder:No ? ?Duration of Psychotic Symptoms:No data recorded ?Hallucinations:No data recorded ?Ideas of Reference:None ? ?Suicidal Thoughts:No data recorded ?Homicidal Thoughts:No data recorded ? ?Sensorium  ?Memory:Immediate Fair; Remote Fair ? ?Judgment:Fair ? ?Insight:Fair ? ? ?Executive Functions  ?Concentration:Fair ? ?Attention Span:Fair ? ?Recall:Fair ? ?Fund of Knowledge:Fair ? ?Language:Fair ? ? ?Psychomotor Activity  ?Psychomotor Activity:No data recorded ? ?Assets   ?Assets:Communication Skills; Desire for Improvement; Financial Resources/Insurance; Housing; Social Support ? ? ?Sleep  ?Sleep:No data recorded ? ? ?Physical Exam: ?Physical Exam ?Vitals and nursing note revi

## 2021-10-15 NOTE — [facility] Group Notes (Signed)
[facility] Group Notes:  (Nursing/MHT/Case Management/Adjunct) ? ?Date:  10/14/2021 ?Time:  10:00 AM ? ?Type of Therapy:  Psychoeducational Skills ? ?Participation Level:  Active ? ?Participation Quality:  Appropriate ? ?Affect:  Appropriate ? ?Cognitive:  Appropriate ? ?Insight:  Appropriate ? ?Engagement in Group:  Engaged ? ?Modes of Intervention:  Discussion ? ?Summary of Progress/Problems: ?The pt attended group and participated in the activity, as well as shared during discussion. ?Barbaraann Rondo ?10/15/2021, 11:55 AM ?

## 2021-10-15 NOTE — Group Note (Signed)
[facility] LCSW Group Therapy Note ? ? ?Group Date: 10/15/2021 ?Start Time: 1415 ?End Time: 1515 ? ? ?Type of Therapy/Topic:  Group Therapy:  Emotion Regulation ? ?Participation Level:  Minimal  ? ? ? ?Description of Group:   ? The purpose of this group is to assist patients in learning to regulate negative emotions and experience positive emotions. Patients will be guided to discuss ways in which they have been vulnerable to their negative emotions. These vulnerabilities will be juxtaposed with experiences of positive emotions or situations, and patients challenged to use positive emotions to combat negative ones. Special emphasis will be placed on coping with negative emotions in conflict situations, and patients will process healthy conflict resolution skills. ? ?Therapeutic Goals: ?Patient will identify two positive emotions or experiences to reflect on in order to balance out negative emotions:  ?Patient will label two or more emotions that they find the most difficult to experience:  ?Patient will be able to demonstrate positive conflict resolution skills through discussion or role plays:  ? ?Summary of Patient Progress: ? ? ?Patient was present for the entirety of the group session. Patient was an active listener and participated in the topic of discussion, provided helpful advice to others, and added nuance to topic of conversation. CSW led patients through emotional regulation activity  to facilitate discussion and illicit thoughts and feelings regarding topic. When asked what was her favorite way to relax she stated "listening to music." ? ? ? ?Therapeutic Modalities:   ?Cognitive Behavioral Therapy ?Feelings Identification ?Dialectical Behavioral Therapy ? ? ?Colleen [name], LCSWA ?

## 2021-10-15 NOTE — Progress Notes (Signed)
Patient complain of dizziness vitals taken blood pressure 72/47, patient inform by writer not to get up without assist. Gatorade and saltine crackers given, vitals recheck blood pressure went up to 116/ 77. MD made aware. Patient stated she's feeling much better. Patient request to go back to her room and was assist back to her room by Clinical research associate. Patient is currently in her room resting in no apparent distress at this time. Q15 minute safety check continue. ?

## 2021-10-15 NOTE — Plan of Care (Signed)
Patient is Calm and Cooperative.  Patient presents Depressed due to medical condition.  Pt c/o dizziness. Emotional support and encouragement given.  Nursing staff continues to encourage fluids and stand by assist when pt is ambulating.  Patient did isolate to bedroom during shift.  Patient did not have hs snack.  Informed patient to notify nursing staff if needs arise.  Q 15 minute safety checks in place.   ? ? ?Problem: Education: ?Goal: Knowledge of [place] [facility] Education information/materials will improve ?Outcome: Progressing ?Goal: Emotional status will improve ?Outcome: Progressing ?Goal: Mental status will improve ?Outcome: Progressing ?Goal: Verbalization of understanding the information provided will improve ?Outcome: Progressing ?  ?Problem: Activity: ?Goal: Interest or engagement in activities will improve ?Outcome: Progressing ?  ?

## 2021-10-15 NOTE — Progress Notes (Signed)
Recreation Therapy Notes ? ?Date: 10/15/2021 ? ?Time: 10:00 am  ? ?Location: Day room    ? ?Behavioral response: Appropriate  ? ?Intervention Topic: Animal Assisted Therapy  ? ?Discussion/Intervention:  ?Animal Assisted Therapy took place today during group.  Animal Assisted Therapy is the planned inclusion of an animal in a patient's treatment plan. The patients were able to engage in therapy with an animal during group. Participants were educated on what a service dog is and the different between a support dog and a service dog. Patient were informed on how animal needs are like a person needs. Individuals were enlightened on the process to get a service animal or support animal. Patients got the opportunity to pet the animal and were offered emotional support from the animal and staff.  ?Clinical Observations/Feedback:  ?Patient came to group and was on topic and was focused on what peers and staff had to say. Participant shared their experiences and history with animals. Individual was social with peers, staff and animal while participating in group.  ?Kleigh Hoelzer LRT/CTRS  ? ? ? ? ? ? ? ?Konstance Happel ?10/15/2021 12:52 PM ? ? ? ? ? ? ? ?Savreen Gebhardt ?10/15/2021 12:52 PM ?

## 2021-10-15 NOTE — Plan of Care (Signed)
Patient Calm and Cooperative on unit during shift.  Patient denies SI/HI/AVH and contracts for safety.  Patient continues to verbalize Depression due to no hope for medical condition improving. Emotional support and encouragement given. Pt did comply with scheduled medications.  Pt did have minimal participation on unit.  Q 15 minute safety checks in place. ? ?Problem: Education: ?Goal: Knowledge of [place] [facility] Education information/materials will improve ?Outcome: Progressing ?Goal: Emotional status will improve ?Outcome: Progressing ?Goal: Mental status will improve ?Outcome: Progressing ?Goal: Verbalization of understanding the information provided will improve ?Outcome: Progressing ?  ?Problem: Activity: ?Goal: Interest or engagement in activities will improve ?Outcome: Progressing ?  ?

## 2021-10-15 NOTE — Plan of Care (Signed)
Patient remain AOX4, calm and cooperative during assessment. Patient denies SI, HI, and AVH. Endorsed anxiety of 3/10 and depression of 5/10. Denies pain or discomfort at this time. Report having a good night sleep. Ate breakfast in the day room among peers with good appetite. Compliant with all due medications. Remain safe on the unit with Q15 minute safety check. ? ?Problem: Education: ?Goal: Knowledge of [place] [facility] Education information/materials will improve ?Outcome: Progressing ?Goal: Emotional status will improve ?Outcome: Progressing ?Goal: Mental status will improve ?Outcome: Progressing ?Goal: Verbalization of understanding the information provided will improve ?Outcome: Progressing ?  ?Problem: Activity: ?Goal: Interest or engagement in activities will improve ?Outcome: Progressing ?Goal: Sleeping patterns will improve ?Outcome: Progressing ?  ?Problem: Coping: ?Goal: Ability to verbalize frustrations and anger appropriately will improve ?Outcome: Progressing ?Goal: Ability to demonstrate self-control will improve ?Outcome: Progressing ?  ?Problem: Health Behavior/Discharge Planning: ?Goal: Identification of resources available to assist in meeting health care needs will improve ?Outcome: Progressing ?Goal: Compliance with treatment plan for underlying cause of condition will improve ?Outcome: Progressing ?  ?Problem: Physical Regulation: ?Goal: Ability to maintain clinical measurements within normal limits will improve ?Outcome: Progressing ?  ?Problem: Safety: ?Goal: Periods of time without injury will increase ?Outcome: Progressing ?  ?

## 2021-10-16 MED ORDER — RISPERIDONE 1 MG PO TABS
1.0000 mg | ORAL_TABLET | ORAL | Status: DC
  Administered 2021-10-16 – 2021-10-27 (×22): 1 mg via ORAL
  Filled 2021-10-16 (×23): qty 1

## 2021-10-16 MED ORDER — LOSARTAN POTASSIUM 25 MG PO TABS
25.0000 mg | ORAL_TABLET | Freq: Every day | ORAL | Status: DC
  Administered 2021-10-17: 25 mg via ORAL
  Filled 2021-10-16 (×2): qty 1

## 2021-10-16 NOTE — Progress Notes (Signed)
[facility] MD Progress Note ? ?10/16/2021 10:23 AM ?Colleen [name]  ?MRN:  [id number] ?Subjective: [facility].  She is a little isolative to her room.  She states that she slept well.  She has had some intrusive thoughts of suicide but states that she is not having them now.  She is tolerating her medications.  She denies any side effects.  There is no evidence of EPS or TD.  We discussed going up on her Risperdal for her intrusive negative thoughts before we change her medications.  She was agreeable. ? ?Principal Problem: Major depressive disorder, recurrent severe without psychotic features (HCC) ?Diagnosis: Principal Problem: ?  Major depressive disorder, recurrent severe without psychotic features (HCC) ? ?Total Time spent with patient: 15 minutes ? ?Past Psychiatric History:  [facility] ?Colleen [name], Katherine S, MD,  patient has a past psychiatric history of MDD and anxiety.  She is prescribed Xanax 0.5 mg tablets twice daily. [facility] with increased depression and anxiety.  She was prescribed Lexapro 5 mg daily, with an appointment to see Dr. Gilmore LarocheAkhtar for medication management on 09/12/2021.  [place]. ? ?Past Medical History:  ?Past Medical History:  ?Diagnosis Date  ? Allergy   ? Anxiety   ? Arrhythmia   ? "does not see cardiologist, sees pcp, Dr. Alonza SmokerJeff Todd  ? Asthma   ? DX IN MARCH 2012 "allergy induced asthma"  ? Bronchitis   ? hx of  ? Depression   ? "past hx of for Panic attacks, not on medication at this time"  ? Family history of anesthesia complication   ? mother "nausea and vomiting post surger"  ? GERD (gastroesophageal reflux disease)   ? Goiter 03/17/2012  ? 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 Heartburn   ? Herpes   ? on medication for outbreaks only  ? Hypertension   ? Seizures (HCC)   ? MEDICATION INDUCED  ? Urinary tract infection   ? hx  of  ?  ?Past Surgical History:  ?Procedure Laterality Date  ? CESAREAN SECTION    ? x 2  ? COLONOSCOPY    ? ELBOW SURGERY    ? right  ? KNEE ARTHROSCOPY    ? right  ? PILONIDAL CYST EXCISION    ? spinal cystectomy    ? "where pilonidal cyst was"  ? THYROIDECTOMY  04/07/2012  ? Procedure: THYROIDECTOMY;  Surgeon: Currie Parishristian J Streck, MD;  Location[facility] OR;  Service: General;  Laterality: N/A;   ?  ? ?Family History:  ?Family History  ?Problem Relation Age of Onset  ? Heart failure Father   ? Hypertension Father   ? Seizures Father   ? Heart disease Father   ? Cervical cancer Mother   ? Seizures Brother   ? Irritable bowel syndrome Brother   ? Cirrhosis Brother   ? Hypertension Brother   ?     x 4  ? Fibromyalgia Sister   ? Atrial fibrillation Sister   ? Prostate cancer Brother   ? Breast cancer Neg Hx   ? ? ?Social History:  ?Social History  ? ?Substance and Sexual Activity  ?Alcohol Use No  ? Alcohol/week: 0.0 standard drinks  ?   ?Social History  ? ?Substance and Sexual Activity  ?Drug Use No  ?  ?Social History  ? ?Socioeconomic History  ? Marital status: Divorced  ?  Spouse name: Not on file  ? Number of children: 2  ? Years of education: Not on file  ? Highest education level: Not on file  ?Occupational History  ? Occupation: Patient Care Customer Service  ?  Employer: [employer]  ?Tobacco Use  ? Smoking status: Former  ?  Types: Cigarettes  ?  Quit date: 05/01/1999  ?  Years since quitting: 22.4  ? Smokeless tobacco: Never  ?Substance and Sexual Activity  ? Alcohol use: No  ?  Alcohol/week: 0.0 standard drinks  ? Drug use: No  ? Sexual activity: Yes  ?  Partners: Male  ?Other Topics Concern  ? Not on file  ?Social History Narrative  ? Not on file  ? ?Social Determinants of Health  ? ?Financial Resource Strain: Not on file  ?Food Insecurity: Not on file  ?Transportation Needs: Not on file  ?Physical Activity: Not on file  ?Stress: Not on file  ?Social Connections: Not on file  ? ?Additional Social History:  ?  ?  ?  ?   ?  ?  ?  ?  ?  ?  ?  ? ?Sleep: Good ? ?Appetite:  Good ? ?Current Medications: ?Current Facility-Administered Medications  ?Medication Dose Route Frequency Provider Last Rate Last Admin  ? alum & mag hydroxide-simeth (MAALOX/MYLANTA) 200-200-20 MG/5ML suspension 30 mL  30 mL Oral Q4H PRN Charm Rings, NP   30 mL at 10/13/21 1903  ? carbamazepine (TEGRETOL) chewable tablet 50 mg  50 mg Oral BH-q8a4p Sarina Ill, DO   50 mg at 10/16/21 3532  ? cholecalciferol (VITAMIN D3) tablet 5,000 Units  5,000 Units Oral Daily Charm Rings, NP   5,000 Units at 10/14/21 9924  ? clonazePAM (KLONOPIN) tablet 0.5 mg  0.5 mg Oral TID Danelle Earthly, Kashif   0.5 mg at 10/16/21 1017  ? hydrOXYzine (ATARAX) tablet 25 mg  25 mg Oral QHS PRN Charm Rings, NP   25 mg at 10/12/21 2158  ? hydrOXYzine (ATARAX) tablet 50 mg  50 mg Oral QHS Sarina Ill, DO   50 mg at 10/15/21 2116  ? levothyroxine (SYNTHROID) tablet 100 mcg  100 mcg Oral Q0600 Charm Rings, NP   100 mcg at 10/16/21 2683  ? [START ON 10/17/2021] losartan (COZAAR) tablet 25 mg  25 mg Oral Daily Sarina Ill, DO      ? magnesium hydroxide (MILK OF MAGNESIA) suspension 30 mL  30 mL Oral Daily PRN Charm Rings, NP   30 mL at 10/10/21 2204  ? multivitamin with minerals tablet 1 tablet  1 tablet Oral Daily Charm Rings, NP   1 tablet at 10/16/21 1016  ? ondansetron (ZOFRAN) tablet 4 mg  4 mg Oral Q6H PRN Charm Rings, NP      ? Or  ? ondansetron (ZOFRAN) injection 4 mg  4 mg Intravenous Q6H PRN Charm Rings, NP      ? risperiDONE (RISPERDAL) tablet 1 mg  1 mg Oral BH-q8a4p Sarina Ill, DO      ? venlafaxine XR (EFFEXOR-XR) 24 hr capsule 112.5 mg  112.5 mg Oral QPC breakfast Sarina Ill, DO   112.5 mg at 10/16/21 4196  ? ? ?Lab Results: No results found for this or any previous visit (from the past 48 hour(s)). ? ?Blood Alcohol level:  ?Lab Results  ?Component Value Date  ? ETH <10 10/08/2021  ? ETH <10 10/06/2021   ? ? ?Metabolic Disorder Labs: ?Lab  Results  ?Component Value Date  ? HGBA1C 4.8 09/09/2021  ? MPG 91.06 09/09/2021  ? ?No results found for: PROLACTIN ?Lab Results  ?Component Value Date  ? CHOL 165 09/09/2021  ? TRIG 136 09/09/2021  ? HDL 71 09/09/2021  ? CHOLHDL 2.3 09/09/2021  ? VLDL 27 09/09/2021  ? LDLCALC 67 09/09/2021  ? LDLCALC 121 (H) 11/09/2007  ? ? ?Physical Findings: ?AIMS:  , ,  ,  ,    ?CIWA:    ?COWS:    ? ?Musculoskeletal: ?Strength & Muscle Tone: within normal limits ?Gait & Station: normal ?Patient leans: N/A ? ?Psychiatric Specialty Exam: ? ?Presentation  ?General Appearance: Appropriate for Environment; Casual; Fairly Groomed; Neat; Well Groomed ? ?Eye Contact:Fair ? ?Speech:Slow ? ?Speech Volume:Decreased ? ?Handedness:Right ? ? ?Mood and Affect  ?Mood:Anxious; Depressed; Irritable ? ?Affect:Restricted ? ? ?Thought Process  ?Thought Processes:Coherent ? ?Descriptions of Associations:Intact ? ?Orientation:Full (Time, Place and Person) ? ?Thought Content:Logical ? ?History of Schizophrenia/Schizoaffective disorder:No ? ?Duration of Psychotic Symptoms:No data recorded ?Hallucinations:No data recorded ?Ideas of Reference:None ? ?Suicidal Thoughts:No data recorded ?Homicidal Thoughts:No data recorded ? ?Sensorium  ?Memory:Immediate Fair; Remote Fair ? ?Judgment:Fair ? ?Insight:Fair ? ? ?Executive Functions  ?Concentration:Fair ? ?Attention Span:Fair ? ?Recall:Fair ? ?Fund of Knowledge:Fair ? ?Language:Fair ? ? ?Psychomotor Activity  ?Psychomotor Activity:No data recorded ? ?Assets  ?Assets:Communication Skills; Desire for Improvement; Financial Resources/Insurance; Housing; Social Support ? ? ?Sleep  ?Sleep:No data recorded ? ? ?Physical Exam: ?Physical Exam ?Vitals and nursing note reviewed.  ?Constitutional:   ?   Appearance: Normal appearance. She is normal weight.  ?Neurological:  ?   General: No focal deficit present.  ?   Mental Status: She is alert and oriented to person, place, and time.   ?Psychiatric:     ?   Attention and Perception: Attention and perception normal.     ?   Mood and Affect: Mood is depressed. Affect is flat.     ?   Speech: Speech normal.     ?   Behavior: Behavior n

## 2021-10-16 NOTE — Plan of Care (Signed)
Patient is Calm and Cooperative.  Patient denies SI/HI/AVH and contracts for safety.  Patient did verbalize minimal Depression. Noted minimal participation in therapeutic milieu.  Compliant with medications as scheduled.  Q 15 minute safety checks in place.  Informed patient to notify nursing staff if needs arise.  ? ?Problem: Education: ?Goal: Knowledge of [facility] Education information/materials will improve ?Outcome: Progressing ?Goal: Emotional status will improve ?Outcome: Progressing ?Goal: Mental status will improve ?Outcome: Progressing ?Goal: Verbalization of understanding the information provided will improve ?Outcome: Progressing ?  ?Problem: Activity: ?Goal: Interest or engagement in activities will improve ?Outcome: Progressing ? Will continue plan of care.   ?

## 2021-10-16 NOTE — Progress Notes (Signed)
Recreation Therapy Notes ? ?Date: 10/16/2021 ? ?Time: 1:25pm   ? ?Location: Day room    ? ?Behavioral response: Appropriate ? ?Intervention Topic: Social-Skills   ? ?Discussion/Intervention:  ?Group content on today was focused on social skills. The group defined social skills and identified ways they use social skills. Patients expressed what obstacles they face when trying to be social. Participants described the importance of social skills. The group listed ways to improve social skills and reasons to improve social skills. Individuals had an opportunity to learn new and improve social skills as well as identify their weaknesses. ?Clinical Observations/Feedback: ?Patient came to group and identified many ways to socialize with others. Participant shared experiences they have had in the past when socializing with others. Individual was social with peers and staff while participating in the intervention.    ?[name] LRT/CTRS  ? ? ? ? ? ? ? ?Colleen [name] ?10/16/2021 3:15 PM ?

## 2021-10-16 NOTE — Plan of Care (Signed)
Patient remain alert and oriented calm and cooperative. Denies SI, HI, AVH. Denies anxiety endorsed depression of 5;/10. Ate meals in the day room among peers and tolerated well. Denies pain or discomfort at this time. Patient remain safe on the unit with Q15 minute safety check. ? ?Problem: Education: ?Goal: Knowledge of [facility] Education information/materials will improve ?Outcome: Progressing ?Goal: Emotional status will improve ?Outcome: Progressing ?Goal: Mental status will improve ?Outcome: Progressing ?Goal: Verbalization of understanding the information provided will improve ?Outcome: Progressing ?  ?Problem: Activity: ?Goal: Interest or engagement in activities will improve ?Outcome: Progressing ?Goal: Sleeping patterns will improve ?Outcome: Progressing ?  ?Problem: Coping: ?Goal: Ability to verbalize frustrations and anger appropriately will improve ?Outcome: Progressing ?Goal: Ability to demonstrate self-control will improve ?Outcome: Progressing ?  ?Problem: Health Behavior/Discharge Planning: ?Goal: Identification of resources available to assist in meeting health care needs will improve ?Outcome: Progressing ?Goal: Compliance with treatment plan for underlying cause of condition will improve ?Outcome: Progressing ?  ?Problem: Physical Regulation: ?Goal: Ability to maintain clinical measurements within normal limits will improve ?Outcome: Progressing ?  ?Problem: Safety: ?Goal: Periods of time without injury will increase ?Outcome: Progressing ?  ?

## 2021-10-17 LAB — CBC WITH DIFFERENTIAL/PLATELET
Abs Immature Granulocytes: 0.01 10*3/uL (ref 0.00–0.07)
Basophils Absolute: 0.1 10*3/uL (ref 0.0–0.1)
Basophils Relative: 2 %
Eosinophils Absolute: 0.2 10*3/uL (ref 0.0–0.5)
Eosinophils Relative: 6 %
HCT: 31.9 % — ABNORMAL LOW (ref 36.0–46.0)
Hemoglobin: 11.2 g/dL — ABNORMAL LOW (ref 12.0–15.0)
Immature Granulocytes: 0 %
Lymphocytes Relative: 30 %
Lymphs Abs: 0.8 10*3/uL (ref 0.7–4.0)
MCH: 32.6 pg (ref 26.0–34.0)
MCHC: 35.1 g/dL (ref 30.0–36.0)
MCV: 92.7 fL (ref 80.0–100.0)
Monocytes Absolute: 0.5 10*3/uL (ref 0.1–1.0)
Monocytes Relative: 17 %
Neutro Abs: 1.2 10*3/uL — ABNORMAL LOW (ref 1.7–7.7)
Neutrophils Relative %: 45 %
Platelets: 196 10*3/uL (ref 150–400)
RBC: 3.44 MIL/uL — ABNORMAL LOW (ref 3.87–5.11)
RDW: 11.5 % (ref 11.5–15.5)
WBC: 2.7 10*3/uL — ABNORMAL LOW (ref 4.0–10.5)
nRBC: 0 % (ref 0.0–0.2)

## 2021-10-17 LAB — LIPID PANEL
Cholesterol: 160 mg/dL (ref 0–200)
HDL: 53 mg/dL (ref >40)
LDL Cholesterol: 86 mg/dL (ref 0–99)
Total CHOL/HDL Ratio: 3 RATIO
Triglycerides: 107 mg/dL (ref <150)
VLDL: 21 mg/dL (ref 0–40)

## 2021-10-17 LAB — COMPREHENSIVE METABOLIC PANEL
ALT: 19 U/L (ref 0–44)
AST: 18 U/L (ref 15–41)
Albumin: 3.5 g/dL (ref 3.5–5.0)
Alkaline Phosphatase: 57 U/L (ref 38–126)
Anion gap: 6 (ref 5–15)
BUN: 12 mg/dL (ref 8–23)
CO2: 27 mmol/L (ref 22–32)
Calcium: 8.5 mg/dL — ABNORMAL LOW (ref 8.9–10.3)
Chloride: 97 mmol/L — ABNORMAL LOW (ref 98–111)
Creatinine, Ser: 0.84 mg/dL (ref 0.44–1.00)
GFR, Estimated: >60 mL/min (ref >60)
Glucose, Bld: 92 mg/dL (ref 70–99)
Potassium: 4.5 mmol/L (ref 3.5–5.1)
Sodium: 130 mmol/L — ABNORMAL LOW (ref 135–145)
Total Bilirubin: 0.6 mg/dL (ref 0.3–1.2)
Total Protein: 6.3 g/dL — ABNORMAL LOW (ref 6.5–8.1)

## 2021-10-17 MED ORDER — VENLAFAXINE HCL ER 75 MG PO CP24
150.0000 mg | ORAL_CAPSULE | Freq: Every day | ORAL | Status: DC
  Administered 2021-10-18 – 2021-10-20 (×3): 150 mg via ORAL
  Filled 2021-10-17 (×3): qty 2

## 2021-10-17 NOTE — Progress Notes (Signed)
[facility] ? ?10/17/2021 10:29 AM ?Colleen MuskratAnne Kathleen [name]  ?MRN:  [id number] ?Subjective: And is doing better this morning.  We went up on her Risperdal yesterday.  She states that the intrusive thoughts and suicidal thoughts have decreased. ? ?Principal Problem: Major depressive disorder, recurrent severe without psychotic features (HCC) ?Diagnosis: Principal Problem: ?  Major depressive disorder, recurrent severe without psychotic features (HCC) ? ?Total Time spent with patient: 15 minutes ? ?Past Psychiatric History: [facility] ?[name], Katherine S, MD,  patient has a past psychiatric history of MDD and anxiety.  She is prescribed Xanax 0.5 mg tablets twice daily. [facility] with increased depression and anxiety.  She was prescribed Lexapro 5 mg daily, with an appointment to see Dr. Gilmore LarocheAkhtar for medication management on 09/12/2021.  Recently discharged from [facility]. ? ?Past Medical History:  ?Past Medical History:  ?Diagnosis Date  ? Allergy   ? Anxiety   ? Arrhythmia   ? "does not see cardiologist, sees pcp, Dr. Alonza SmokerJeff Todd  ? Asthma   ? DX IN MARCH 2012 "allergy induced asthma"  ? Bronchitis   ? hx of  ? Depression   ? "past hx of for Panic attacks, not on medication at this time"  ? Family history of anesthesia complication   ? mother "nausea and vomiting post surger"  ? GERD (gastroesophageal reflux disease)   ? Goiter 03/17/2012  ? 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 Heartburn   ? Herpes   ? on medication for outbreaks only  ? Hypertension   ? Seizures (HCC)   ? MEDICATION INDUCED  ? Urinary tract infection   ? hx of  ?  ?Past Surgical History:  ?Procedure Laterality Date  ? CESAREAN SECTION    ? x 2  ? COLONOSCOPY    ? ELBOW SURGERY    ? right  ? KNEE ARTHROSCOPY    ? right  ? PILONIDAL CYST EXCISION    ? spinal cystectomy    ? "where pilonidal cyst was"  ? THYROIDECTOMY  04/07/2012   ? Procedure: THYROIDECTOMY;  Surgeon: Currie Parishristian J Streck, MD;  [facility];  Service: General;  Laterality: N/A;   ?  ? ?Family History:  ?Family History  ?Problem Relation Age of Onset  ? Heart failure Father   ? Hypertension Father   ? Seizures Father   ? Heart disease Father   ? Cervical cancer Mother   ? Seizures Brother   ? Irritable bowel syndrome Brother   ? Cirrhosis Brother   ? Hypertension Brother   ?     x 4  ? Fibromyalgia Sister   ? Atrial fibrillation Sister   ? Prostate cancer Brother   ? Breast cancer Neg Hx   ? ? ?Social History:  ?Social History  ? ?Substance and Sexual Activity  ?Alcohol Use No  ? Alcohol/week: 0.0 standard drinks  ?   ?Social History  ? ?Substance and Sexual Activity  ?Drug Use No  ?  ?Social History  ? ?Socioeconomic History  ? Marital status: Divorced  ?  Spouse name: Not on file  ? Number of children: 2  ? Years of education: Not on file  ? Highest education level: Not on file  ?Occupational History  ? Occupation: Patient Care Customer Service  ?  Employer: [employer]  ?Tobacco Use  ? Smoking status: Former  ?  Types: Cigarettes  ?  Quit date: 05/01/1999  ?  Years since quitting: 22.4  ? Smokeless tobacco: Never  ?Substance and Sexual Activity  ? Alcohol use: No  ?  Alcohol/week: 0.0 standard drinks  ? Drug use: No  ? Sexual activity: Yes  ?  Partners: Male  ?Other Topics Concern  ? Not on file  ?Social History Narrative  ? Not on file  ? ?Social Determinants of Health  ? ?Financial Resource Strain: Not on file  ?Food Insecurity: Not on file  ?Transportation Needs: Not on file  ?Physical Activity: Not on file  ?Stress: Not on file  ?Social Connections: Not on file  ? ?Additional Social History:  ?  ?  ?  ?  ?  ?  ?  ?  ?  ?  ?  ? ?Sleep: Good ? ?Appetite:  Good ? ?Current Medications: ?Current Facility-Administered Medications  ?Medication Dose Route Frequency Provider Last Rate Last Admin  ? alum & mag hydroxide-simeth (MAALOX/MYLANTA) 200-200-20 MG/5ML suspension 30 mL  30  mL Oral Q4H PRN Charm Rings, NP   30 mL at 10/13/21 1903  ? carbamazepine (TEGRETOL) chewable tablet 50 mg  50 mg Oral BH-q8a4p Sarina Ill, DO   50 mg at 10/17/21 4401  ? cholecalciferol (VITAMIN D3) tablet 5,000 Units  5,000 Units Oral Daily Charm Rings, NP   5,000 Units at 10/17/21 0900  ? clonazePAM (KLONOPIN) tablet 0.5 mg  0.5 mg Oral TID Danelle Earthly, Kashif   0.5 mg at 10/17/21 0900  ? hydrOXYzine (ATARAX) tablet 25 mg  25 mg Oral QHS PRN Charm Rings, NP   25 mg at 10/12/21 2158  ? hydrOXYzine (ATARAX) tablet 50 mg  50 mg Oral QHS Sarina Ill, DO   50 mg at 10/16/21 2140  ? levothyroxine (SYNTHROID) tablet 100 mcg  100 mcg Oral Q0600 Charm Rings, NP   100 mcg at 10/17/21 0272  ? losartan (COZAAR) tablet 25 mg  25 mg Oral Daily Sarina Ill, DO   25 mg at 10/17/21 5366  ? magnesium hydroxide (MILK OF MAGNESIA) suspension 30 mL  30 mL Oral Daily PRN Charm Rings, NP   30 mL at 10/16/21 2147  ? multivitamin with minerals tablet 1 tablet  1 tablet Oral Daily Charm Rings, NP   1 tablet at 10/17/21 0900  ? ondansetron (ZOFRAN) tablet 4 mg  4 mg Oral Q6H PRN Charm Rings, NP      ? Or  ? ondansetron (ZOFRAN) injection 4 mg  4 mg Intravenous Q6H PRN Charm Rings, NP      ? risperiDONE (RISPERDAL) tablet 1 mg  1 mg Oral BH-q8a4p Sarina Ill, DO   1 mg at 10/17/21 4403  ? [START ON 10/18/2021] venlafaxine XR (EFFEXOR-XR) 24 hr capsule 150 mg  150 mg Oral QPC breakfast Sarina Ill, DO      ? ? ?Lab Results:  ?Results for orders placed or performed during the hospital encounter of 10/10/21 (from the past 48 hour(s))  ?CBC with Differential/Platelet     Status: Abnormal  ? Collection Time: 10/17/21  6:21 AM  ?Result Value Ref Range  ? WBC 2.7 (L) 4.0 - 10.5 K/uL  ? RBC 3.44 (L) 3.87 - 5.11 MIL/uL  ? Hemoglobin 11.2 (L) 12.0 - 15.0 g/dL  ? HCT 31.9 (L) 36.0 - 46.0 %  ? MCV 92.7 80.0 - 100.0 fL  ? MCH 32.6 26.0 - 34.0 pg  ? MCHC 35.1 30.0 -  36.0  g/dL  ? RDW 11.5 11.5 - 15.5 %  ? Platelets 196 150 - 400 K/uL  ? nRBC 0.0 0.0 - 0.2 %  ? Neutrophils Relative % 45 %  ? Neutro Abs 1.2 (L) 1.7 - 7.7 K/uL  ? Lymphocytes Relative 30 %  ? Lymphs Abs 0.8 0.7 - 4.0 K/uL  ? Monocytes Relative 17 %  ? Monocytes Absolute 0.5 0.1 - 1.0 K/uL  ? Eosinophils Relative 6 %  ? Eosinophils Absolute 0.2 0.0 - 0.5 K/uL  ? Basophils Relative 2 %  ? Basophils Absolute 0.1 0.0 - 0.1 K/uL  ? Immature Granulocytes 0 %  ? Abs Immature Granulocytes 0.01 0.00 - 0.07 K/uL  ?  Comment: Performed at [facility], [address], [place], [place] [address]  ?Comprehensive metabolic panel     Status: Abnormal  ? Collection Time: 10/17/21  6:21 AM  ?Result Value Ref Range  ? Sodium 130 (L) 135 - 145 mmol/L  ? Potassium 4.5 3.5 - 5.1 mmol/L  ? Chloride 97 (L) 98 - 111 mmol/L  ? CO2 27 22 - 32 mmol/L  ? Glucose, Bld 92 70 - 99 mg/dL  ?  Comment: Glucose reference range applies only to samples taken after fasting for at least 8 hours.  ? BUN 12 8 - 23 mg/dL  ? Creatinine, Ser 0.84 0.44 - 1.00 mg/dL  ? Calcium 8.5 (L) 8.9 - 10.3 mg/dL  ? Total Protein 6.3 (L) 6.5 - 8.1 g/dL  ? Albumin 3.5 3.5 - 5.0 g/dL  ? AST 18 15 - 41 U/L  ? ALT 19 0 - 44 U/L  ? Alkaline Phosphatase 57 38 - 126 U/L  ? Total Bilirubin 0.6 0.3 - 1.2 mg/dL  ? GFR, Estimated >60 >60 mL/min  ?  Comment: (NOTE) ?Calculated using the CKD-EPI Creatinine Equation (2021) ?  ? Anion gap 6 5 - 15  ?  Comment: Performed at [facility], [address] [place], [place] [address]  ?Lipid panel     Status: None  ? Collection Time: 10/17/21  6:21 AM  ?Result Value Ref Range  ? Cholesterol 160 0 - 200 mg/dL  ? Triglycerides 107 <150 mg/dL  ? HDL 53 >40 mg/dL  ? Total CHOL/HDL Ratio 3.0 RATIO  ? VLDL 21 0 - 40 mg/dL  ? LDL Cholesterol 86 0 - 99 mg/dL  ?  Comment:        ?Total Cholesterol/HDL:CHD Risk ?Coronary Heart Disease Risk Table ?                    Men   Women ? 1/2 Average Risk   3.4   3.3 ? Average Risk       5.0    4.4 ? 2 X Average Risk   9.6   7.1 ? 3 X Average Risk  23.4   11.0 ?       ?Use the calculated Patient Ratio ?above and the CHD Risk Table ?to determine the patient's CHD Risk. ?       ?ATP III CLASSI

## 2021-10-17 NOTE — BH IP Treatment Plan (Signed)
Interdisciplinary Treatment and Diagnostic Plan Update ? ?10/17/2021 ?Time of Session: 10:15AM ?Colleen [name] ?MRN: [id number] ? ?Principal Diagnosis: Major depressive disorder, recurrent severe without psychotic features (HCC) ? ?Secondary Diagnoses: Principal Problem: ?  Major depressive disorder, recurrent severe without psychotic features (HCC) ? ? ?Current Medications:  ?Current Facility-Administered Medications  ?Medication Dose Route Frequency Provider Last Rate Last Admin  ? alum & mag hydroxide-simeth (MAALOX/MYLANTA) 200-200-20 MG/5ML suspension 30 mL  30 mL Oral Q4H PRN Charm Rings, NP   30 mL at 10/13/21 1903  ? carbamazepine (TEGRETOL) chewable tablet 50 mg  50 mg Oral BH-q8a4p Sarina Ill, DO   50 mg at 10/17/21 5188  ? cholecalciferol (VITAMIN D3) tablet 5,000 Units  5,000 Units Oral Daily Charm Rings, NP   5,000 Units at 10/17/21 0900  ? clonazePAM (KLONOPIN) tablet 0.5 mg  0.5 mg Oral TID Danelle Earthly, Kashif   0.5 mg at 10/17/21 0900  ? hydrOXYzine (ATARAX) tablet 25 mg  25 mg Oral QHS PRN Charm Rings, NP   25 mg at 10/12/21 2158  ? hydrOXYzine (ATARAX) tablet 50 mg  50 mg Oral QHS Sarina Ill, DO   50 mg at 10/16/21 2140  ? levothyroxine (SYNTHROID) tablet 100 mcg  100 mcg Oral Q0600 Charm Rings, NP   100 mcg at 10/17/21 4166  ? losartan (COZAAR) tablet 25 mg  25 mg Oral Daily Sarina Ill, DO   25 mg at 10/17/21 0630  ? magnesium hydroxide (MILK OF MAGNESIA) suspension 30 mL  30 mL Oral Daily PRN Charm Rings, NP   30 mL at 10/16/21 2147  ? multivitamin with minerals tablet 1 tablet  1 tablet Oral Daily Charm Rings, NP   1 tablet at 10/17/21 0900  ? ondansetron (ZOFRAN) tablet 4 mg  4 mg Oral Q6H PRN Charm Rings, NP      ? Or  ? ondansetron (ZOFRAN) injection 4 mg  4 mg Intravenous Q6H PRN Charm Rings, NP      ? risperiDONE (RISPERDAL) tablet 1 mg  1 mg Oral BH-q8a4p Sarina Ill, DO   1 mg at 10/17/21 1601  ? [START ON  10/18/2021] venlafaxine XR (EFFEXOR-XR) 24 hr capsule 150 mg  150 mg Oral QPC breakfast Sarina Ill, DO      ? ?PTA Medications: ?Medications Prior to Admission  ?Medication Sig Dispense Refill Last Dose  ? carbamazepine (TEGRETOL) 100 MG chewable tablet Chew 0.5 tablets (50 mg total) by mouth daily with supper. 60 tablet 0   ? escitalopram (LEXAPRO) 10 MG tablet Take 0.5 tablets (5 mg total) by mouth daily. 7 tablet 0   ? levothyroxine (SYNTHROID) 100 MCG tablet Take 1 tablet (100 mcg total) by mouth daily. 90 tablet 1   ? losartan (COZAAR) 50 MG tablet Take 1 tablet (50 mg total) by mouth daily.     ? Multiple Vitamin (MULTIVITAMIN) tablet Take 1 tablet by mouth daily.     ? ? ?Patient Stressors: Health problems   ? ?Patient Strengths: Ability for insight  ?Supportive family/friends  ? ?Treatment Modalities: Medication Management, Group therapy, Case management,  ?1 to 1 session with clinician, Psychoeducation, Recreational therapy. ? ? ?Physician Treatment Plan for Primary Diagnosis: Major depressive disorder, recurrent severe without psychotic features (HCC) ?Long Term Goal(s): Improvement in symptoms so as ready for discharge  ? ?Short Term Goals: Ability to maintain clinical measurements within normal limits will improve ?Compliance with prescribed medications will improve ?  Ability to identify triggers associated with substance abuse/mental health issues will improve ?Ability to verbalize feelings will improve ?Ability to disclose and discuss suicidal ideas ?Ability to identify and develop effective coping behaviors will improve ? ?Medication Management: Evaluate patient's response, side effects, and tolerance of medication regimen. ? ?Therapeutic Interventions: 1 to 1 sessions, Unit Group sessions and Medication administration. ? ?Evaluation of Outcomes: Progressing ? ?Physician Treatment Plan for Secondary Diagnosis: Principal Problem: ?  Major depressive disorder, recurrent severe without  psychotic features (HCC) ? ?Long Term Goal(s): Improvement in symptoms so as ready for discharge  ? ?Short Term Goals: Ability to maintain clinical measurements within normal limits will improve ?Compliance with prescribed medications will improve ?Ability to identify triggers associated with substance abuse/mental health issues will improve ?Ability to verbalize feelings will improve ?Ability to disclose and discuss suicidal ideas ?Ability to identify and develop effective coping behaviors will improve    ? ?Medication Management: Evaluate patient's response, side effects, and tolerance of medication regimen. ? ?Therapeutic Interventions: 1 to 1 sessions, Unit Group sessions and Medication administration. ? ?Evaluation of Outcomes: Progressing ? ? ?RN Treatment Plan for Primary Diagnosis: Major depressive disorder, recurrent severe without psychotic features (HCC) ?Long Term Goal(s): Knowledge of disease and therapeutic regimen to maintain health will improve ? ?Short Term Goals: Ability to remain free from injury will improve, Ability to verbalize frustration and anger appropriately will improve, Ability to demonstrate self-control, Ability to participate in decision making will improve, Ability to verbalize feelings will improve, Ability to disclose and discuss suicidal ideas, Ability to identify and develop effective coping behaviors will improve, and Compliance with prescribed medications will improve ? ?Medication Management: RN will administer medications as ordered by provider, will assess and evaluate patient's response and provide education to patient for prescribed medication. RN will report any adverse and/or side effects to prescribing provider. ? ?Therapeutic Interventions: 1 on 1 counseling sessions, Psychoeducation, Medication administration, Evaluate responses to treatment, Monitor vital signs and CBGs as ordered, Perform/monitor CIWA, COWS, AIMS and Fall Risk screenings as ordered, Perform wound  care treatments as ordered. ? ?Evaluation of Outcomes: Progressing ? ? ?LCSW Treatment Plan for Primary Diagnosis: Major depressive disorder, recurrent severe without psychotic features (HCC) ?Long Term Goal(s): Safe transition to appropriate next level of care at discharge, Engage patient in therapeutic group addressing interpersonal concerns. ? ?Short Term Goals: Engage patient in aftercare planning with referrals and resources, Increase social support, Increase ability to appropriately verbalize feelings, Increase emotional regulation, Facilitate acceptance of mental health diagnosis and concerns, Identify triggers associated with mental health/substance abuse issues, and Increase skills for wellness and recovery ? ?Therapeutic Interventions: Assess for all discharge needs, 1 to 1 time with Child psychotherapistocial worker, Explore available resources and support systems, Assess for adequacy in community support network, Educate family and significant other(s) on suicide prevention, Complete Psychosocial Assessment, Interpersonal group therapy. ? ?Evaluation of Outcomes: Progressing ? ? ?Progress in Treatment: ?Attending groups: Yes. ?Participating in groups: Yes. ?Taking medication as prescribed: Yes. ?Toleration medication: Yes. ?Family/Significant other contact made: No, will contact:  Additional attempts will be made to reach patient's collateral contact. ?Patient understands diagnosis: Yes. ?Discussing patient identified problems/goals with staff: Yes. ?Medical problems stabilized or resolved: Yes. ?Denies suicidal/homicidal ideation: No. ?Issues/concerns per patient self-inventory: Yes. ?Other: none.  ? ?New problem(s) identified: No, Describe:  none.  Patient continues to endorse feelings of hopelessness and suicidal ideation, marked by "intrusive thoughts of suicide."  ? ?New Short Term/Long Term Goal(s): Patient to work towards medication  management for mood stabilization; elimination of SI thoughts; development of  comprehensive mental wellness plan. Update 10/17/2021: No changes at this time.  ? ?Patient Goals:  Patient states "I want to get to the level where I am not going to fear going home." Update 10/17/2021: No changes at this t

## 2021-10-17 NOTE — [facility] Group Notes (Signed)
Group: chair yoga  ?Pt was present,participating,calm, composed ?

## 2021-10-17 NOTE — Group Note (Signed)
LCSW Group Therapy Note ? ?Group Date: 10/17/2021 ?Start Time: 1300 ?End Time: 1400 ? ? ?Type of Therapy and Topic:  Group Therapy - Healthy vs Unhealthy Coping Skills ? ?Participation Level:  Did Not Attend  ? ?Description of Group ?The focus of this group was to determine what unhealthy coping techniques typically are used by group members and what healthy coping techniques would be helpful in coping with various problems. Patients were guided in becoming aware of the differences between healthy and unhealthy coping techniques. Patients were asked to identify 2-3 healthy coping skills they would like to learn to use more effectively. ? ?Therapeutic Goals ?Patients learned that coping is what human beings do all day long to deal with various situations in their lives ?Patients defined and discussed healthy vs unhealthy coping techniques ?Patients identified their preferred coping techniques and identified whether these were healthy or unhealthy ?Patients determined 2-3 healthy coping skills they would like to become more familiar with and use more often. ?Patients provided support and ideas to each other ? ? ?Summary of Patient Progress: Due to limited staffing, group was not held on the unit.  ? ?Therapeutic Modalities ?Cognitive Behavioral Therapy ?Motivational Interviewing ? ?Colleen [name], LCSWA ?10/17/2021  3:59 PM   ?

## 2021-10-17 NOTE — Progress Notes (Signed)
Patient remain alert and oriented calm and cooperative. Denies SI, HI, AVH. Endorses depression  a 5/10 and anxiety a 3/10. Ate meals in the day room among peers. Patient denies pain or discomfort at this time. Medications administered as ordered.Patient remains safe on the unit with Q15 minute safety check. ?

## 2021-10-18 NOTE — Group Note (Addendum)
[facility] LCSW Group Therapy Note ? ? ?Group Date: 10/18/2021 ?Start Time: 1310 ?End Time: 1410 ? ? ?Type of Therapy/Topic:  Group Therapy:  Emotion Regulation ? ?Participation Level:  Active  ? ?Description of Group:   ? The purpose of this group is to assist patients in learning to regulate negative emotions and experience positive emotions. Patients will be guided to discuss ways in which they have been vulnerable to their negative emotions. These vulnerabilities will be juxtaposed with experiences of positive emotions or situations, and patients challenged to use positive emotions to combat negative ones. Special emphasis will be placed on coping with negative emotions in conflict situations, and patients will process healthy conflict resolution skills. ? ?Therapeutic Goals: ?Patient will identify two positive emotions or experiences to reflect on in order to balance out negative emotions:  ?Patient will label two or more emotions that they find the most difficult to experience:  ?Patient will be able to demonstrate positive conflict resolution skills through discussion or role plays:  ? ?Summary of Patient Progress: Patient was present for the entirety of the group session. Patient was an active listener and participated in the topic of discussion. Patient identified fear as well as racing and ruminating thoughts as emotions that are difficult to experience. Patient was able to identify positive emotions from the past; however, expressed dysphoria and feelings of hopelessness when reflecting on recent events and emotions. ? ? ? ?Therapeutic Modalities:   ?Cognitive Behavioral Therapy ?Feelings Identification ?Dialectical Behavioral Therapy ? ? ?Ileana Ladd Aerabella Galasso, LCSWA ?

## 2021-10-18 NOTE — Progress Notes (Signed)
The patient was observed in the day room watching TV with her peers. Denies SI/HI AVH. She does have som depression and no anxiety. She was med compliant and slept the majority of the shift. No complaints of pain.  ? ?Ivonne Andrew, RN ? ?

## 2021-10-18 NOTE — [facility] Group Notes (Signed)
Pt refused - mid day outdoor group  ?

## 2021-10-18 NOTE — Progress Notes (Signed)
D: Pt alert and oriented. Pt rates her anxiety/depression 6/10 at this time. Pt denies experiencing any pain at this time. Pt denies experiencing any SI/HI, or AVH at this time.   ?A: Scheduled medications administered to pt, per MD orders. Cozaar discounted/MD d/t decrease BP readings(see flow sheet). Support and encouragement provided. Frequent verbal contact made. Routine safety checks conducted q15 minutes.  ?R: No adverse drug reactions noted. Pt verbally contracts for safety at this time. Pt complaint with medications. Pt interacts appropriately with others on the unit. Pt remains safe at this time. Will continue to monitor.  ?

## 2021-10-18 NOTE — [facility] Suicide Risk Assessment (Signed)
[facility] INPATIENT:  Family/Significant Other Suicide Prevention Education ? ?Suicide Prevention Education:  ?Education Completed; Tressa Busman (friend) [phone] has been identified by the patient as the family member/significant other with whom the patient will be residing, and identified as the person(s) who will aid the patient in the event of a mental health crisis (suicidal ideations/suicide attempt).  With written consent from the patient, the family member/significant other has been provided the following suicide prevention education, prior to the and/or following the discharge of the patient. ? ?The suicide prevention education provided includes the following: ?Suicide risk factors ?Suicide prevention and interventions ?National Suicide Hotline telephone number ?[facility] assessment telephone number ?[facility] Emergency Assistance 911 ?[place] and/or Residential Mobile Crisis Unit telephone number ? ?Request made of family/significant other to: ?Remove weapons (e.g., guns, rifles, knives), all items previously/currently identified as safety concern.   ?Remove drugs/medications (over-the-counter, prescriptions, illicit drugs), all items previously/currently identified as a safety concern. ? ?The family member/significant other verbalizes understanding of the suicide prevention education information provided.  The family member/significant other agrees to remove the items of safety concern listed above. ? ?CSW made a third attempt to reach patient's friend, Tressa Busman using initial number provided by patient, [phone]. Telephone number was out of service. CSW met with patient to confirm correct phone number. Patient informed CSW that patient had the incorrect phone number and provided CSW with updated contact information for [facility], [phone]. ? ?Tressa Busman confirms that patient can stay with her short-term after leaving the hospital. Jacqlyn Larsen states her biggest  con[place] Patient does have wifi and cell phone service at her home. Jacqlyn Larsen shares, "it got to the point where she couldn't trust herself not to do something stupid like overtake her medications." Jacqlyn Larsen reports she has patient's medications at this time, as patient had given Becky her medications prior to admission as a safety precaution due to suicidal ideation.  ? ?Jacqlyn Larsen reports patient's recent triggers are "anxiety due to imbalance of medication and (patient's) neurological condition that cause episodes that are unpredictable and cause her to go into a panic." Jacqlyn Larsen reports she believes if patient can "have as much knowledge as possible about her neurological condition" this would decrease patient's anxiety. Becky reports prior to hospitalization, patient was "exhausted from trying to get a balance of medication needed to not have anxiety and depression" which made patient feel hopeless. Jacqlyn Larsen reports patient continues to express interest in an intensive outpatient program post hospital discharge to gain coping skills to manage anxiety/depression symptoms.  ? ?Berniece Salines ?10/18/2021, 3:19 PM ?

## 2021-10-18 NOTE — Progress Notes (Signed)
[facility] MD Progress Note ? ?10/18/2021 10:38 AM ?Colleen [name]  ?MRN:  [id number] ?Subjective: And states that she feels a little bit tired with the increase in the Risperdal.  It has helped with her negative thoughts and suicidal thoughts.  Her hemoglobin A1c and lipid panel were good.  Her CBC and CMP were unchanged and within normal limits for the most part.  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 ? ?Principal Problem: Major depressive disorder, recurrent severe without psychotic features (HCC) ?Diagnosis: Principal Problem: ?  Major depressive disorder, recurrent severe without psychotic features (HCC) ? ?Total Time spent with patient: 15 minutes ? ?Past Psychiatric History: [facility] ?Colleen Husk, MD,  patient has a past psychiatric history of MDD and anxiety.  She is prescribed Xanax 0.5 mg tablets twice daily. She presented to [facility] on [facility]/2021 with increased depression and anxiety.  She was prescribed Lexapro 5 mg daily, with an appointment to see [name] for medication management on 09/12/2021.  Recently discharged from old [place]. ? ?Past Medical History:  ?Past Medical History:  ?Diagnosis Date  ? Allergy   ? Anxiety   ? Arrhythmia   ? "does not see cardiologist, sees pcp, Dr. Alonza Smoker  ? Asthma   ? DX IN MARCH 2012 "allergy induced asthma"  ? Bronchitis   ? hx of  ? Depression   ? "past hx of for Panic attacks, not on medication at this time"  ? Family history of anesthesia complication   ? mother "nausea and vomiting post surger"  ? GERD (gastroesophageal reflux disease)   ? Goiter 03/17/2012  ? 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 Heartburn   ? Herpes   ? on medication for outbreaks only  ? Hypertension   ? Seizures (HCC)   ? MEDICATION INDUCED  ? Urinary tract infection   ? hx of  ?  ?Past Surgical History:  ?Procedure Laterality Date  ? CESAREAN SECTION    ?  x 2  ? COLONOSCOPY    ? ELBOW SURGERY    ? right  ? KNEE ARTHROSCOPY    ? right  ? PILONIDAL CYST EXCISION    ? spinal cystectomy    ? "where pilonidal cyst was"  ? THYROIDECTOMY  04/07/2012  ? Procedure: THYROIDECTOMY;  Surgeon: Currie Paris, MD;  Location: [facility] OR;  Service: General;  Laterality: N/A;   ?  ? ?Family History:  ?Family History  ?Problem Relation Age of Onset  ? Heart failure Father   ? Hypertension Father   ? Seizures Father   ? Heart disease Father   ? Cervical cancer Mother   ? Seizures Brother   ? Irritable bowel syndrome Brother   ? Cirrhosis Brother   ? Hypertension Brother   ?     x 4  ? Fibromyalgia Sister   ? Atrial fibrillation Sister   ? Prostate cancer Brother   ? Breast cancer Neg Hx   ? ? ?Social History:  ?Social History  ? ?Substance and Sexual Activity  ?Alcohol Use No  ? Alcohol/week: 0.0 standard drinks  ?   ?Social History  ? ?Substance and Sexual Activity  ?Drug Use No  ?  ?Social History  ? ?Socioeconomic History  ? Marital status: Divorced  ?  Spouse name: Not on file  ? Number of children: 2  ? Years of education: Not on file  ?  Highest education level: Not on file  ?Occupational History  ? Occupation: Patient Care Customer Service  ?  Employer: [employer]  ?Tobacco Use  ? Smoking status: Former  ?  Types: Cigarettes  ?  Quit date: 05/01/1999  ?  Years since quitting: 22.4  ? Smokeless tobacco: Never  ?Substance and Sexual Activity  ? Alcohol use: No  ?  Alcohol/week: 0.0 standard drinks  ? Drug use: No  ? Sexual activity: Yes  ?  Partners: Male  ?Other Topics Concern  ? Not on file  ?Social History Narrative  ? Not on file  ? ?Social Determinants of Health  ? ?Financial Resource Strain: Not on file  ?Food Insecurity: Not on file  ?Transportation Needs: Not on file  ?Physical Activity: Not on file  ?Stress: Not on file  ?Social Connections: Not on file  ? ?Additional Social History:  ?  ?  ?  ?  ?  ?  ?  ?  ?  ?  ?  ? ?Sleep: Good ? ?Appetite:  Good ? ?Current  Medications: ?Current Facility-Administered Medications  ?Medication Dose Route Frequency Provider Last Rate Last Admin  ? alum & mag hydroxide-simeth (MAALOX/MYLANTA) 200-200-20 MG/5ML suspension 30 mL  30 mL Oral Q4H PRN Charm Rings, NP   30 mL at 10/13/21 1903  ? carbamazepine (TEGRETOL) chewable tablet 50 mg  50 mg Oral BH-q8a4p Sarina Ill, DO   50 mg at 10/18/21 0818  ? cholecalciferol (VITAMIN D3) tablet 5,000 Units  5,000 Units Oral Daily Charm Rings, NP   5,000 Units at 10/18/21 2774  ? clonazePAM (KLONOPIN) tablet 0.5 mg  0.5 mg Oral TID Danelle Earthly, Kashif   0.5 mg at 10/18/21 1287  ? hydrOXYzine (ATARAX) tablet 25 mg  25 mg Oral QHS PRN Charm Rings, NP   25 mg at 10/12/21 2158  ? hydrOXYzine (ATARAX) tablet 50 mg  50 mg Oral QHS Sarina Ill, DO   50 mg at 10/17/21 2055  ? levothyroxine (SYNTHROID) tablet 100 mcg  100 mcg Oral Q0600 Charm Rings, NP   100 mcg at 10/18/21 8676  ? losartan (COZAAR) tablet 25 mg  25 mg Oral Daily Sarina Ill, DO   25 mg at 10/17/21 7209  ? magnesium hydroxide (MILK OF MAGNESIA) suspension 30 mL  30 mL Oral Daily PRN Charm Rings, NP   30 mL at 10/16/21 2147  ? multivitamin with minerals tablet 1 tablet  1 tablet Oral Daily Charm Rings, NP   1 tablet at 10/18/21 4709  ? ondansetron (ZOFRAN) tablet 4 mg  4 mg Oral Q6H PRN Charm Rings, NP      ? Or  ? ondansetron (ZOFRAN) injection 4 mg  4 mg Intravenous Q6H PRN Charm Rings, NP      ? risperiDONE (RISPERDAL) tablet 1 mg  1 mg Oral BH-q8a4p Sarina Ill, DO   1 mg at 10/18/21 6283  ? venlafaxine XR (EFFEXOR-XR) 24 hr capsule 150 mg  150 mg Oral QPC breakfast Sarina Ill, DO   150 mg at 10/18/21 6629  ? ? ?Lab Results:  ?Results for orders placed or performed during the hospital encounter of 10/10/21 (from the past 48 hour(s))  ?CBC with Differential/Platelet     Status: Abnormal  ? Collection Time: 10/17/21  6:21 AM  ?Result Value Ref Range  ? WBC  2.7 (L) 4.0 - 10.5 K/uL  ? RBC 3.44 (L) 3.87 - 5.11 MIL/uL  ?  Hemoglobin 11.2 (L) 12.0 - 15.0 g/dL  ? HCT 31.9 (L) 36.0 - 46.0 %  ? MCV 92.7 80.0 - 100.0 fL  ? MCH 32.6 26.0 - 34.0 pg  ? MCHC 35.1 30.0 - 36.0 g/dL  ? RDW 11.5 11.5 - 15.5 %  ? Platelets 196 150 - 400 K/uL  ? nRBC 0.0 0.0 - 0.2 %  ? Neutrophils Relative % 45 %  ? Neutro Abs 1.2 (L) 1.7 - 7.7 K/uL  ? Lymphocytes Relative 30 %  ? Lymphs Abs 0.8 0.7 - 4.0 K/uL  ? Monocytes Relative 17 %  ? Monocytes Absolute 0.5 0.1 - 1.0 K/uL  ? Eosinophils Relative 6 %  ? Eosinophils Absolute 0.2 0.0 - 0.5 K/uL  ? Basophils Relative 2 %  ? Basophils Absolute 0.1 0.0 - 0.1 K/uL  ? Immature Granulocytes 0 %  ? Abs Immature Granulocytes 0.01 0.00 - 0.07 K/uL  ?  Comment: Performed at [facility], [address] [place], [place] [address]  ?Comprehensive metabolic panel     Status: Abnormal  ? Collection Time: 10/17/21  6:21 AM  ?Result Value Ref Range  ? Sodium 130 (L) 135 - 145 mmol/L  ? Potassium 4.5 3.5 - 5.1 mmol/L  ? Chloride 97 (L) 98 - 111 mmol/L  ? CO2 27 22 - 32 mmol/L  ? Glucose, Bld 92 70 - 99 mg/dL  ?  Comment: Glucose reference range applies only to samples taken after fasting for at least 8 hours.  ? BUN 12 8 - 23 mg/dL  ? Creatinine, Ser 0.84 0.44 - 1.00 mg/dL  ? Calcium 8.5 (L) 8.9 - 10.3 mg/dL  ? Total Protein 6.3 (L) 6.5 - 8.1 g/dL  ? Albumin 3.5 3.5 - 5.0 g/dL  ? AST 18 15 - 41 U/L  ? ALT 19 0 - 44 U/L  ? Alkaline Phosphatase 57 38 - 126 U/L  ? Total Bilirubin 0.6 0.3 - 1.2 mg/dL  ? GFR, Estimated >60 >60 mL/min  ?  Comment: (NOTE) ?Calculated using the CKD-EPI Creatinine Equation (2021) ?  ? Anion gap 6 5 - 15  ?  Comment: Performed at [facility], [address] [place], [place] [address]  ?Lipid panel     Status: None  ? Collection Time: 10/17/21  6:21 AM  ?Result Value Ref Range  ? Cholesterol 160 0 - 200 mg/dL  ? Triglycerides 107 <150 mg/dL  ? HDL 53 >40 mg/dL  ? Total CHOL/HDL Ratio 3.0 RATIO  ? VLDL 21 0 - 40 mg/dL  ? LDL  Cholesterol 86 0 - 99 mg/dL  ?  Comment:        ?Total Cholesterol/HDL:CHD Risk ?Coronary Heart Disease Risk Table ?                    Men   Women ? 1/2 Average Risk   3.4   3.3 ? Average Risk       5.0   4.4 ? 2

## 2021-10-18 NOTE — [facility] Group Notes (Signed)
Pt participated  in AM chair yoga,- pt calm, friendly and ccooperative ? ?

## 2021-10-19 NOTE — Progress Notes (Signed)
Recreation Therapy Notes ? ?Date: 10/19/2021 ?  ?Time: 2:00 pm  ?  ?Location:  Court yard ?  ?Behavioral response: N/A ?  ?Intervention Topic: Strengths    ?  ?Discussion/Intervention: ?Patient refused to attend group. ?  ?Clinical Observations/Feedback:  ?Patient refused to attend group. ?  ?Colleen [name] LRT/CTRS ? ? ? ? ? ? ? ?Colleen [name] ?10/19/2021 2:20 PM ?

## 2021-10-19 NOTE — Plan of Care (Signed)
Patient Calm and Cooperative during shift. Patient presents Flat with minimal participation in therapeutic milieu. Patient denies SI/HI/Avh and contracts for safety.  Patient verbalized Depression as 3/10.  Patient did verbalize using Positive Imagery and Positive Affirmations as healthy coping skills upon discharge.  Patient did have night snack and adequate fluids. Patient did comply with scheduled medications per MD and denies any adverse reactions. Q 15 minute safety checks in place.  Patient in no apparent distress.  Plan of care will continue.  ?Problem: Education: ?Goal: Knowledge of [place] [facility] Education information/materials will improve ?Outcome: Progressing ?Goal: Emotional status will improve ?Outcome: Progressing ?Goal: Mental status will improve ?Outcome: Progressing ?Goal: Verbalization of understanding the information provided will improve ?Outcome: Progressing ?  ?

## 2021-10-19 NOTE — Group Note (Signed)
[facility] LCSW Group Therapy Note ? ? ? ?Group Date: 10/19/2021 ?Start Time: 1400 ?End Time: 1405 ? ?Type of Therapy and Topic:  Group Therapy:  Overcoming Obstacles ? ?Participation Level:  [facility] PARTICIPATION LEVEL: Did Not Attend ? ?Mood: ? ?Description of Group:   ?In this group patients will be encouraged to explore what they see as obstacles to their own wellness and recovery. They will be guided to discuss their thoughts, feelings, and behaviors related to these obstacles. The group will process together ways to cope with barriers, with attention given to specific choices patients can make. Each patient will be challenged to identify changes they are motivated to make in order to overcome their obstacles. This group will be process-oriented, with patients participating in exploration of their own experiences as well as giving and receiving support and challenge from other group members. ? ?Therapeutic Goals: ?1. Patient will identify personal and current obstacles as they relate to admission. ?2. Patient will identify barriers that currently interfere with their wellness or overcoming obstacles.  ?3. Patient will identify feelings, thought process and behaviors related to these barriers. ?4. Patient will identify two changes they are willing to make to overcome these obstacles:  ? ? ?Summary of Patient Progress ? ? ?X ? ? ?Therapeutic Modalities:   ?Cognitive Behavioral Therapy ?Solution Focused Therapy ?Motivational Interviewing ?Relapse Prevention Therapy ? ? ?Kaydance Bowie A Lithzy Bernard, LCSWA ?

## 2021-10-19 NOTE — Plan of Care (Signed)
Patient is alert and oriented times 4. Mood and affect appropriate. Patient denies pain. She denies SI, HI, and AVH. Expresses feelings of anxiety and depression 4/10 at this time. States she slept good last night. Morning meds given whole by mouth W/O difficulty. Ate breakfast in day room- appetite good. Patient remains safe on unit with Q15 minute checks in place.  ? ? ?Problem: Education: ?Goal: Knowledge of [place] [facility] Education information/materials will improve ?Outcome: Progressing ?Goal: Emotional status will improve ?Outcome: Progressing ?Goal: Mental status will improve ?Outcome: Progressing ?Goal: Verbalization of understanding the information provided will improve ?Outcome: Progressing ?  ?Problem: Activity: ?Goal: Interest or engagement in activities will improve ?Outcome: Progressing ?Goal: Sleeping patterns will improve ?Outcome: Progressing ?  ?Problem: Coping: ?Goal: Ability to verbalize frustrations and anger appropriately will improve ?Outcome: Progressing ?Goal: Ability to demonstrate self-control will improve ?Outcome: Progressing ?  ?Problem: Health Behavior/Discharge Planning: ?Goal: Identification of resources available to assist in meeting health care needs will improve ?Outcome: Progressing ?Goal: Compliance with treatment plan for underlying cause of condition will improve ?Outcome: Progressing ?  ?Problem: Physical Regulation: ?Goal: Ability to maintain clinical measurements within normal limits will improve ?Outcome: Progressing ?  ?Problem: Safety: ?Goal: Periods of time without injury will increase ?Outcome: Progressing ?  ?

## 2021-10-19 NOTE — Plan of Care (Signed)
°  Problem: Group Participation °Goal: STG - Patient will engage in groups without prompting or encouragement from LRT x3 group sessions within 5 recreation therapy group sessions °Description: STG - Patient will engage in groups without prompting or encouragement from LRT x3 group sessions within 5 recreation therapy group sessions °Outcome: Progressing °  °

## 2021-10-19 NOTE — [facility] Group Notes (Signed)
Group: chair yoga , Pt actively present, calm, composed  ? ?

## 2021-10-19 NOTE — Progress Notes (Signed)
Patient is seen in the dayroom interacting appropriately on the milieu. She has a flat affect. Pt is calm and cooperative AAOx4. She denies SI, HI, and AVH. She denies pain, anxiety, and depression. Pt c/o heart burn. Maalox administered as per [facility] orders with desired effect. Pt is safe and in no distress at this time. Q 15 minute safety checks in place.  ?

## 2021-10-19 NOTE — Progress Notes (Signed)
[facility] MD Progress Note ? ?10/19/2021 12:02 PM ?[place]  ?MRN:  [id number] ?Subjective: And states that she is feeling better.  She is having less suicidal thoughts.  She states that things have improved.  She is able to contract for safety in the hospital.  She is taking her medications as prescribed and denies any side effects.  Her affect is a little bit better today. ? ?Principal Problem: Major depressive disorder, recurrent severe without psychotic features ([place]) ?Diagnosis: Principal Problem: ?  Major depressive disorder, recurrent severe without psychotic features ([place]) ? ?Total Time spent with patient: 15 minutes ? ?Past Psychiatric History: [facility] ?Ival Bible, MD,  patient has a past psychiatric history of MDD and anxiety.  She is prescribed Xanax 0.5 mg tablets twice daily. She presented to [facility] on 09/07/2021 with increased depression and anxiety.  She was prescribed Lexapro 5 mg daily, with an appointment to see Dr. De Nurse for medication management on 09/12/2021.  Recently discharged from [facility] [place]. ? ?Past Medical History:  ?Past Medical History:  ?Diagnosis Date  ? Allergy   ? Anxiety   ? Arrhythmia   ? "does not see cardiologist, sees pcp, Dr. Christie Nottingham  ? Asthma   ? DX IN MARCH 2012 "allergy induced asthma"  ? Bronchitis   ? hx of  ? Depression   ? "past hx of for Panic attacks, not on medication at this time"  ? Family history of anesthesia complication   ? mother "nausea and vomiting post surger"  ? GERD (gastroesophageal reflux disease)   ? Goiter 03/17/2012  ? 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 Heartburn   ? Herpes   ? on medication for outbreaks only  ? Hypertension   ? Seizures ([place])   ? MEDICATION INDUCED  ? Urinary tract infection   ? hx of  ?  ?Past Surgical History:  ?Procedure Laterality Date  ? CESAREAN SECTION    ? x 2  ? COLONOSCOPY    ? ELBOW SURGERY    ? right  ?  KNEE ARTHROSCOPY    ? right  ? PILONIDAL CYST EXCISION    ? spinal cystectomy    ? "where pilonidal cyst was"  ? THYROIDECTOMY  04/07/2012  ? Procedure: THYROIDECTOMY;  Surgeon: Haywood Lasso, MD;  Location: [place];  Service: General;  Laterality: N/A;   ?  ? ?Family History:  ?Family History  ?Problem Relation Age of Onset  ? Heart failure Father   ? Hypertension Father   ? Seizures Father   ? Heart disease Father   ? Cervical cancer Mother   ? Seizures Brother   ? Irritable bowel syndrome Brother   ? Cirrhosis Brother   ? Hypertension Brother   ?     x 4  ? Fibromyalgia Sister   ? Atrial fibrillation Sister   ? Prostate cancer Brother   ? Breast cancer Neg Hx   ? ? ?Social History:  ?Social History  ? ?Substance and Sexual Activity  ?Alcohol Use No  ? Alcohol/week: 0.0 standard drinks  ?   ?Social History  ? ?Substance and Sexual Activity  ?Drug Use No  ?  ?Social History  ? ?Socioeconomic History  ? Marital status: Divorced  ?  Spouse name: Not on file  ? Number of children: 2  ? Years of education: Not on file  ? Highest education level: Not on file  ?  Occupational History  ? Occupation: Patient Care Customer Service  ?  Employer: [employer]  ?Tobacco Use  ? Smoking status: Former  ?  Types: Cigarettes  ?  Quit date: 05/01/1999  ?  Years since quitting: 22.4  ? Smokeless tobacco: Never  ?Substance and Sexual Activity  ? Alcohol use: No  ?  Alcohol/week: 0.0 standard drinks  ? Drug use: No  ? Sexual activity: Yes  ?  Partners: Male  ?Other Topics Concern  ? Not on file  ?Social History Narrative  ? Not on file  ? ?Social Determinants of Health  ? ?Financial Resource Strain: Not on file  ?Food Insecurity: Not on file  ?Transportation Needs: Not on file  ?Physical Activity: Not on file  ?Stress: Not on file  ?Social Connections: Not on file  ? ?Additional Social History:  ?  ?  ?  ?  ?  ?  ?  ?  ?  ?  ?  ? ?Sleep: Good ? ?Appetite:  Good ? ?Current Medications: ?Current Facility-Administered Medications  ?Medication  Dose Route Frequency Provider Last Rate Last Admin  ? alum & mag hydroxide-simeth (MAALOX/MYLANTA) [id number] MG/5ML suspension 30 mL  30 mL Oral Q4H PRN Patrecia Pour, NP   30 mL at 10/13/21 1903  ? carbamazepine (TEGRETOL) chewable tablet 50 mg  50 mg Oral BH-q8a4p Parks Ranger, DO   50 mg at 10/19/21 [id number]  ? cholecalciferol (VITAMIN D3) tablet 5,000 Units  5,000 Units Oral Daily Patrecia Pour, NP   5,000 Units at 10/19/21 0901  ? clonazePAM (KLONOPIN) tablet 0.5 mg  0.5 mg Oral TID Wynell Balloon, Kashif   0.5 mg at 10/19/21 0901  ? hydrOXYzine (ATARAX) tablet 25 mg  25 mg Oral QHS PRN Patrecia Pour, NP   25 mg at 10/12/21 2158  ? hydrOXYzine (ATARAX) tablet 50 mg  50 mg Oral QHS Parks Ranger, DO   50 mg at 10/18/21 2127  ? levothyroxine (SYNTHROID) tablet 100 mcg  100 mcg Oral Q0600 Patrecia Pour, NP   100 mcg at 10/19/21 [id number]  ? magnesium hydroxide (MILK OF MAGNESIA) suspension 30 mL  30 mL Oral Daily PRN Patrecia Pour, NP   30 mL at 10/16/21 2147  ? multivitamin with minerals tablet 1 tablet  1 tablet Oral Daily Patrecia Pour, NP   1 tablet at 10/19/21 0901  ? ondansetron (ZOFRAN) tablet 4 mg  4 mg Oral Q6H PRN Patrecia Pour, NP      ? Or  ? ondansetron (ZOFRAN) injection 4 mg  4 mg Intravenous Q6H PRN Patrecia Pour, NP      ? risperiDONE (RISPERDAL) tablet 1 mg  1 mg Oral BH-q8a4p Parks Ranger, DO   1 mg at 10/19/21 [id number]  ? venlafaxine XR (EFFEXOR-XR) 24 hr capsule 150 mg  150 mg Oral QPC breakfast Parks Ranger, DO   150 mg at 10/19/21 [id number]  ? ? ?Lab Results: No results found for this or any previous visit (from the past 48 hour(s)). ? ?Blood Alcohol level:  ?Lab Results  ?Component Value Date  ? ETH <10 10/08/2021  ? ETH <10 10/06/2021  ? ? ?Metabolic Disorder Labs: ?Lab Results  ?Component Value Date  ? HGBA1C 4.8 09/09/2021  ? MPG 91.06 09/09/2021  ? ?No results found for: PROLACTIN ?Lab Results  ?Component Value Date  ? CHOL 160 10/17/2021  ? TRIG 107  10/17/2021  ? HDL 53 10/17/2021  ? CHOLHDL 3.0 10/17/2021  ?  VLDL 21 10/17/2021  ? [place] 86 10/17/2021  ? [place] 67 09/09/2021  ? ? ?Physical Findings: ?AIMS:  , ,  ,  ,    ?CIWA:    ?COWS:    ? ?Musculoskeletal: ?Strength & Muscle Tone: within normal limits ?Gait & Station: normal ?Patient leans: N/A ? ?Psychiatric Specialty Exam: ? ?Presentation  ?General Appearance: Appropriate for Environment; Casual; Fairly Groomed; Neat; Well Groomed ? ?Eye Contact:Fair ? ?Speech:Slow ? ?Speech Volume:Decreased ? ?Handedness:Right ? ? ?Mood and Affect  ?Mood:Anxious; Depressed; Irritable ? ?Affect:Restricted ? ? ?Thought Process  ?Thought Processes:Coherent ? ?Descriptions of Associations:Intact ? ?Orientation:Full (Time, Place and Person) ? ?Thought Content:Logical ? ?History of Schizophrenia/Schizoaffective disorder:No ? ?Duration of Psychotic Symptoms:No data recorded ?Hallucinations:No data recorded ?Ideas of Reference:None ? ?Suicidal Thoughts:No data recorded ?Homicidal Thoughts:No data recorded ? ?Sensorium  ?Memory:Immediate Fair; Remote Fair ? ?Judgment:Fair ? ?Insight:Fair ? ? ?Executive Functions  ?Concentration:Fair ? ?Attention Span:Fair ? ?Recall:Fair ? ?[place] ? ?Language:Fair ? ? ?Psychomotor Activity  ?Psychomotor Activity:No data recorded ? ?Assets  ?Assets:Communication Skills; Desire for Improvement; Financial Resources/Insurance; Housing; Social Support ? ? ?Sleep  ?Sleep:No data recorded ? ? ?Physical Exam: ?Physical Exam ?Vitals and nursing note reviewed.  ?Constitutional:   ?   Appearance: Normal appearance. She is normal weight.  ?Neurological:  ?   General: No focal deficit present.  ?   Mental Status: She is alert and oriented to person, place, and time.  ?Psychiatric:     ?   Attention and Perception: Attention and perception normal.     ?   Mood and Affect: Mood is anxious and depressed.     ?   Speech: Speech normal.     ?   Behavior: Behavior normal. Behavior is cooperative.      ?   Thought Content: Thought content normal.     ?   Cognition and Memory: Cognition and memory normal.     ?   Judgment: Judgment normal.  ? ?Review of Systems  ?Constitutional: Negative.   ?HENT: Negat

## 2021-10-20 LAB — HEMOGLOBIN A1C
Hgb A1c MFr Bld: 5.1 % (ref 4.8–5.6)
Mean Plasma Glucose: 99.67 mg/dL

## 2021-10-20 MED ORDER — VENLAFAXINE HCL ER 75 MG PO CP24
75.0000 mg | ORAL_CAPSULE | Freq: Once | ORAL | Status: AC
  Administered 2021-10-20: 75 mg via ORAL
  Filled 2021-10-20: qty 1

## 2021-10-20 MED ORDER — VENLAFAXINE HCL ER 75 MG PO CP24
225.0000 mg | ORAL_CAPSULE | Freq: Every day | ORAL | Status: DC
  Administered 2021-10-21 – 2021-10-27 (×7): 225 mg via ORAL
  Filled 2021-10-20 (×7): qty 3

## 2021-10-20 NOTE — Plan of Care (Signed)
Patient remain alert and oriented X4, calm and cooperative during assessment. Endorsed depression of 3/10. Denies anxiety, SI, HI, and AVH. Denies pain or discomfort. Ate breakfast in the day room among peers with good appetite. Compliant with due medications. Remain safe on the unit with Q15 minute safety checks. ? ?Problem: Education: ?Goal: Knowledge of [place] [facility] Education information/materials will improve ?Outcome: Progressing ?Goal: Emotional status will improve ?Outcome: Progressing ?Goal: Mental status will improve ?Outcome: Progressing ?Goal: Verbalization of understanding the information provided will improve ?Outcome: Progressing ?  ?Problem: Activity: ?Goal: Interest or engagement in activities will improve ?Outcome: Progressing ?Goal: Sleeping patterns will improve ?Outcome: Progressing ?  ?Problem: Coping: ?Goal: Ability to verbalize frustrations and anger appropriately will improve ?Outcome: Progressing ?Goal: Ability to demonstrate self-control will improve ?Outcome: Progressing ?  ?Problem: Health Behavior/Discharge Planning: ?Goal: Identification of resources available to assist in meeting health care needs will improve ?Outcome: Progressing ?Goal: Compliance with treatment plan for underlying cause of condition will improve ?Outcome: Progressing ?  ?Problem: Physical Regulation: ?Goal: Ability to maintain clinical measurements within normal limits will improve ?Outcome: Progressing ?  ?Problem: Safety: ?Goal: Periods of time without injury will increase ?Outcome: Progressing ?  ?

## 2021-10-20 NOTE — Progress Notes (Signed)
[facility] MD Progress Note ? ?10/20/2021 12:08 PM ?[place]  ?MRN:  [id number] ?Subjective: Colleen [name] is seen on rounds.  She is starting to feel better.  She does feel a little bit tired on her Risperdal but states that her negative thoughts and suicidal thoughts have decreased.  I talked to her about going up on the Effexor and how that might help with her energy concentration and depression.  She is able to contract for safety in the hospital.  She denies any side effects other than feeling a little bit sluggish from Risperdal. ? ?Principal Problem: Major depressive disorder, recurrent severe without psychotic features ([place]) ?Diagnosis: Principal Problem: ?  Major depressive disorder, recurrent severe without psychotic features ([place]) ? ?Total Time spent with patient: 15 minutes ? ?Past Psychiatric History:  ?Past Medical History:  ?Past Medical History:  ?Diagnosis Date  ? Allergy   ? Anxiety   ? Arrhythmia   ? "does not see cardiologist, sees pcp, Dr. Christie Nottingham  ? Asthma   ? DX IN MARCH 2012 "allergy induced asthma"  ? Bronchitis   ? hx of  ? Depression   ? "past hx of for Panic attacks, not on medication at this time"  ? Family history of anesthesia complication   ? mother "nausea and vomiting post surger"  ? GERD (gastroesophageal reflux disease)   ? Goiter 03/17/2012  ? 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 Heartburn   ? Herpes   ? on medication for outbreaks only  ? Hypertension   ? Seizures ([place])   ? MEDICATION INDUCED  ? Urinary tract infection   ? hx of  ?  ?Past Surgical History:  ?Procedure Laterality Date  ? CESAREAN SECTION    ? x 2  ? COLONOSCOPY    ? ELBOW SURGERY    ? right  ? KNEE ARTHROSCOPY    ? right  ? PILONIDAL CYST EXCISION    ? spinal cystectomy    ? "where pilonidal cyst was"  ? THYROIDECTOMY  04/07/2012  ? Procedure: THYROIDECTOMY;  Surgeon: Haywood Lasso, MD;  Location: [place];  Service: General;  Laterality:  N/A;   ?  ? ?Family History:  ?Family History  ?Problem Relation Age of Onset  ? Heart failure Father   ? Hypertension Father   ? Seizures Father   ? Heart disease Father   ? Cervical cancer Mother   ? Seizures Brother   ? Irritable bowel syndrome Brother   ? Cirrhosis Brother   ? Hypertension Brother   ?     x 4  ? Fibromyalgia Sister   ? Atrial fibrillation Sister   ? Prostate cancer Brother   ? Breast cancer Neg Hx   ? ? ?Social History:  ?Social History  ? ?Substance and Sexual Activity  ?Alcohol Use No  ? Alcohol/week: 0.0 standard drinks  ?   ?Social History  ? ?Substance and Sexual Activity  ?Drug Use No  ?  ?Social History  ? ?Socioeconomic History  ? Marital status: Divorced  ?  Spouse name: Not on file  ? Number of children: 2  ? Years of education: Not on file  ? Highest education level: Not on file  ?Occupational History  ? Occupation: Patient Care Customer Service  ?  Employer: [employer]  ?Tobacco Use  ? Smoking status: Former  ?  Types: Cigarettes  ?  Quit date: 05/01/1999  ?  Years since quitting:  22.4  ? Smokeless tobacco: Never  ?Substance and Sexual Activity  ? Alcohol use: No  ?  Alcohol/week: 0.0 standard drinks  ? Drug use: No  ? Sexual activity: Yes  ?  Partners: Male  ?Other Topics Concern  ? Not on file  ?Social History Narrative  ? Not on file  ? ?Social Determinants of Health  ? ?Financial Resource Strain: Not on file  ?Food Insecurity: Not on file  ?Transportation Needs: Not on file  ?Physical Activity: Not on file  ?Stress: Not on file  ?Social Connections: Not on file  ? ?Additional Social History:  ?  ?  ?  ?  ?  ?  ?  ?  ?  ?  ?  ? ?Sleep: Good ? ?Appetite:  Good ? ?Current Medications: ?Current Facility-Administered Medications  ?Medication Dose Route Frequency Provider Last Rate Last Admin  ? alum & mag hydroxide-simeth (MAALOX/MYLANTA) 200-200-20 MG/5ML suspension 30 mL  30 mL Oral Q4H PRN Patrecia Pour, NP   30 mL at 10/19/21 2122  ? carbamazepine (TEGRETOL) chewable tablet 50 mg   50 mg Oral BH-q8a4p Parks Ranger, DO   50 mg at 10/20/21 [id number]  ? cholecalciferol (VITAMIN D3) tablet 5,000 Units  5,000 Units Oral Daily Patrecia Pour, NP   5,000 Units at 10/20/21 [id number]  ? clonazePAM (KLONOPIN) tablet 0.5 mg  0.5 mg Oral TID Wynell Balloon, Kashif   0.5 mg at 10/20/21 [id number]  ? hydrOXYzine (ATARAX) tablet 25 mg  25 mg Oral QHS PRN Patrecia Pour, NP   25 mg at 10/12/21 2158  ? hydrOXYzine (ATARAX) tablet 50 mg  50 mg Oral QHS Parks Ranger, DO   50 mg at 10/19/21 2111  ? levothyroxine (SYNTHROID) tablet 100 mcg  100 mcg Oral Q0600 Patrecia Pour, NP   100 mcg at 10/20/21 [id number]  ? magnesium hydroxide (MILK OF MAGNESIA) suspension 30 mL  30 mL Oral Daily PRN Patrecia Pour, NP   30 mL at 10/16/21 2147  ? multivitamin with minerals tablet 1 tablet  1 tablet Oral Daily Patrecia Pour, NP   1 tablet at 10/20/21 [id number]  ? ondansetron (ZOFRAN) tablet 4 mg  4 mg Oral Q6H PRN Patrecia Pour, NP      ? Or  ? ondansetron (ZOFRAN) injection 4 mg  4 mg Intravenous Q6H PRN Patrecia Pour, NP      ? risperiDONE (RISPERDAL) tablet 1 mg  1 mg Oral BH-q8a4p Parks Ranger, DO   1 mg at 10/20/21 [id number]  ? [START ON 10/21/2021] venlafaxine XR (EFFEXOR-XR) 24 hr capsule 225 mg  225 mg Oral QPC breakfast Parks Ranger, DO      ? ? ?Lab Results: No results found for this or any previous visit (from the past 48 hour(s)). ? ?Blood Alcohol level:  ?Lab Results  ?Component Value Date  ? ETH <10 10/08/2021  ? ETH <10 10/06/2021  ? ? ?Metabolic Disorder Labs: ?Lab Results  ?Component Value Date  ? HGBA1C 5.1 10/17/2021  ? MPG 99.67 10/17/2021  ? MPG 91.06 09/09/2021  ? ?No results found for: PROLACTIN ?Lab Results  ?Component Value Date  ? CHOL 160 10/17/2021  ? TRIG 107 10/17/2021  ? HDL 53 10/17/2021  ? CHOLHDL 3.0 10/17/2021  ? VLDL 21 10/17/2021  ? [place] 86 10/17/2021  ? [place] 67 09/09/2021  ? ? ?Physical Findings: ?AIMS:  , ,  ,  ,    ?CIWA:    ?  COWS:    ? ?Musculoskeletal: ?Strength &  Muscle Tone: within normal limits ?Gait & Station: normal ?Patient leans: N/A ? ?Psychiatric Specialty Exam: ? ?Presentation  ?General Appearance: Appropriate for Environment; Casual; Fairly Groomed; Neat; Well Groomed ? ?Eye Contact:Fair ? ?Speech:Slow ? ?Speech Volume:Decreased ? ?Handedness:Right ? ? ?Mood and Affect  ?Mood:Anxious; Depressed; Irritable ? ?Affect:Restricted ? ? ?Thought Process  ?Thought Processes:Coherent ? ?Descriptions of Associations:Intact ? ?Orientation:Full (Time, Place and Person) ? ?Thought Content:Logical ? ?History of Schizophrenia/Schizoaffective disorder:No ? ?Duration of Psychotic Symptoms:No data recorded ?Hallucinations:No data recorded ?Ideas of Reference:None ? ?Suicidal Thoughts:No data recorded ?Homicidal Thoughts:No data recorded ? ?Sensorium  ?Memory:Immediate Fair; Remote Fair ? ?Judgment:Fair ? ?Insight:Fair ? ? ?Executive Functions  ?Concentration:Fair ? ?Attention Span:Fair ? ?Recall:Fair ? ?[place] ? ?Language:Fair ? ? ?Psychomotor Activity  ?Psychomotor Activity:No data recorded ? ?Assets  ?Assets:Communication Skills; Desire for Improvement; Financial Resources/Insurance; Housing; Social Support ? ? ?Sleep  ?Sleep:No data recorded ? ? ?Physical Exam: ?Physical Exam ?Vitals and nursing note reviewed.  ?Constitutional:   ?   Appearance: Normal appearance. She is normal weight.  ?Neurological:  ?   General: No focal deficit present.  ?   Mental Status: She is alert and oriented to person, place, and time.  ?Psychiatric:     ?   Attention and Perception: Attention and perception normal.     ?   Mood and Affect: Mood is depressed. Affect is flat.     ?   Speech: Speech normal.     ?   Behavior: Behavior normal. Behavior is cooperative.     ?   Thought Content: Thought content normal.     ?   Cognition and Memory: Cognition and memory normal.     ?   Judgment: Judgment normal.  ? ?Review of Systems  ?Constitutional: Negative.   ?HENT: Negative.    ?Eyes:  Negative.   ?Respiratory: Negative.    ?Cardiovascular: Negative.   ?Gastrointestinal: Negative.   ?Genitourinary: Negative.   ?Musculoskeletal: Negative.   ?Skin: Negative.   ?Neurological: Negative.   ?Endo/

## 2021-10-20 NOTE — Progress Notes (Signed)
Recreation Therapy Notes ? ?Date: 10/20/2021 ?  ?Time: 1:45 pm  ? ?Location: Courtyard  ?  ?Behavioral response: Appropriate ?  ?Intervention Topic: Social-Skills   ?  ?Discussion/Intervention:  ?Group content on today was focused on social skills. The group defined social skills and identified ways they use social skills. Patients expressed what obstacles they face when trying to be social. Participants described the importance of social skills. The group listed ways to improve social skills and reasons to improve social skills. Individuals had an opportunity to learn new and improve social skills as well as identify their weaknesses. ?Clinical Observations/Feedback: ?Patient came to group and identified many ways to socialize with others. Participant shared experiences they have had in the past when socializing with others. Individual was social with peers and staff while participating in the intervention.    ?Landra Howze LRT/CTRS  ?  ?  ? ? ? ? ? ? ? ?Laurance Heide ?10/20/2021 3:43 PM ?

## 2021-10-20 NOTE — Progress Notes (Signed)
2000 Pt sitting at table, coloring; calm, cooperative. Pt states that she is feeling "not well" because "My care plan changed. My friend that I'm supposed to stay with daughter is missing, so she asked my daughter if I can stay with her. When I spoke to my daughter, she said that she needs to plan things out. I feel like such a fucking burden." Pt became tearful and this nurse actively listened while pt expressed her feelings. Shift assessment unable to be completed at this time. ? ?2145 Pt standing at nurses' station waiting for nighttime medications; calm cooperative. She denies pain and SI/HI/AVH. She describes her sleep as "okay" and her appetite as "good". She expresses feelings of anxiety, which she rates 4/10 and depression, which she rates 8/10 due to the situation with her friend's daughter and the change in her discharge plans. However, pt stated that she felt better after talking with this nurse earlier. No acute distress noted. ?

## 2021-10-20 NOTE — [facility] Group Notes (Signed)
[facility] Group Notes:  (Nursing/MHT/Case Management/Adjunct) ? ?Date:  10/20/2021  ?Time:  9:30 AM ? ?Type of Therapy:  Psychoeducational Skills ? ?Participation Level:  Active ? ?Participation Quality:  Appropriate ? ?Affect:  Appropriate ? ?Cognitive:  Appropriate ? ?Insight:  Appropriate ? ?Engagement in Group:  Engaged ? ?Modes of Intervention:  Discussion ? ?Summary of Progress/Problems: ?The pt. was very engaged in group. The pt followed along with handouts and was very engaged in discussion. ?Barbaraann Rondo ?10/20/2021, 12:13 PM ?

## 2021-10-20 NOTE — Group Note (Signed)
LCSW Group Therapy Note ? ?Group Date: 10/20/2021 ?Start Time: 1345 ?End Time: 1420 ? ? ?Type of Therapy and Topic:  Group Therapy - Healthy vs Unhealthy Coping Skills ? ?Participation Level:  Active  ? ?Description of Group ?The focus of this group was to determine what unhealthy coping techniques typically are used by group members and what healthy coping techniques would be helpful in coping with various problems. Patients were guided in becoming aware of the differences between healthy and unhealthy coping techniques. Patients were asked to identify 2-3 healthy coping skills they would like to learn to use more effectively. ? ?Therapeutic Goals ?Patients learned that coping is what human beings do all day long to deal with various situations in their lives ?Patients defined and discussed healthy vs unhealthy coping techniques ?Patients identified their preferred coping techniques and identified whether these were healthy or unhealthy ?Patients determined 2-3 healthy coping skills they would like to become more familiar with and use more often. ?Patients provided support and ideas to each other ? ? ?Summary of Patient Progress:  Patient was present for the entirety of the group session. Patient was an active listener and participated in the topic of discussion. CSW facilitated group session in the courtyard. She stated that she still felt angry at times but realized that "reducing expectations" and "being too impatient" could help with her depression. She said that using imagery exercises as a coping skill was helping to reduce her anxiety.  ? ? ? ?Therapeutic Modalities ?Cognitive Behavioral Therapy ?Motivational Interviewing ? ?Alethea Terhaar A [place], LCSWA ?10/20/2021  6:02 PM   ?

## 2021-10-21 NOTE — Progress Notes (Signed)
Patient presents with flat affect. She reports that she was upset last night due to her discharge plan changing. However, patient was able to contact daughter and sort situation out, so she feels a bit better. She denies SI, HI, and AVH. She rates depression as an 8/10 and anxiety as a 7/10. Patient is able to contract for safety while in the hospital, but says she is worried about how she will feel upon discharge.  ? ?Patient had an episode of dizziness while putting meal tray away. Patient's BP was checked and was 89/61 while standing. Patient was brought back to her room and given Gatorade. Patient says these episodes of dizziness come on suddenly, but she hadn't had one in two days.  ? ?Patient is compliant with scheduled medications. Patient remains safe on the unit at this time. ?

## 2021-10-21 NOTE — Progress Notes (Signed)
Recreation Therapy Notes ? ?Date: 10/21/2021 ? ?Time: 1:45pm   ? ?Location: Day room    ? ?Behavioral response: Appropriate ? ?Intervention Topic: Problem Solving   ? ?Discussion/Intervention:  ?Group content on today was focused on problem solving. The group described what problem solving is. Patients expressed how problems affect them and how they deal with problems. Individuals identified healthy ways to deal with problems. Patients explained what normally happens to them when they do not deal with problems. The group expressed reoccurring problems for them. The group participated in the intervention ?Ways to Solve problems? where patients were given a chance to explore different ways to solve problems.  ?Clinical Observations/Feedback: ?Patient came to group an expressed the many problems they face and how to come up with positive solution. Participant shared how effective problem solving can help them in the future. Individual was social with peers and staff while participating in the intervention.    ?Jimya Ciani LRT/CTRS  ? ? ? ? ? ? ? ?Liany Mumpower ?10/21/2021 3:09 PM ?

## 2021-10-21 NOTE — Progress Notes (Signed)
[facility] MD Progress Note ? ?10/21/2021 2:01 PM ?Colleen [name]  ?MRN:  [id number] ?Subjective: And is seen on rounds.  She tells me that her discharge plans to a friend's house has changed because the friend has a family problem.  She has spoke to her daughter with whom she is going to live with for a couple weeks.  That has been kind of an issue because she states that her daughter has her own mental health problems.  She has a lot of anxiety surrounding her negative thoughts but tells me she knows that it is not real it is just a feeling.  She is better on her current medications.  I told her we can hold off on going up on her Effexor for now.  She feels like the medications have been helpful so far. ? ?Principal Problem: Major depressive disorder, recurrent severe without psychotic features (HCC) ?Diagnosis: Principal Problem: ?  Major depressive disorder, recurrent severe without psychotic features (HCC) ? ?Total Time spent with patient: 15 minutes ? ?Past Psychiatric History: [facility] ?Colleen Husk, MD,  patient has a past psychiatric history of MDD and anxiety.  She is prescribed Xanax 0.5 mg tablets twice daily. She presented to [facility] on 09/07/2021 with increased depression and anxiety.  She was prescribed Lexapro 5 mg daily, with an appointment to see [name] for medication management on 09/12/2021.  Recently discharged from old [place]. ?  ? ?Past Medical History:  ?Past Medical History:  ?Diagnosis Date  ? Allergy   ? Anxiety   ? Arrhythmia   ? "does not see cardiologist, sees pcp, Dr. Alonza Smoker  ? Asthma   ? DX IN MARCH 2012 "allergy induced asthma"  ? Bronchitis   ? hx of  ? Depression   ? "past hx of for Panic attacks, not on medication at this time"  ? Family history of anesthesia complication   ? mother "nausea and vomiting post surger"  ? GERD (gastroesophageal reflux disease)   ? Goiter 03/17/2012  ? 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 Heartburn   ? Herpes   ? on medication for outbreaks only  ? Hypertension   ? Seizures (HCC)   ? MEDICATION INDUCED  ? Urinary tract infection   ? hx of  ?  ?Past Surgical History:  ?Procedure Laterality Date  ? CESAREAN SECTION    ? x 2  ? COLONOSCOPY    ? ELBOW SURGERY    ? right  ? KNEE ARTHROSCOPY    ? right  ? PILONIDAL CYST EXCISION    ? spinal cystectomy    ? "where pilonidal cyst was"  ? THYROIDECTOMY  04/07/2012  ? Procedure: THYROIDECTOMY;  Surgeon: Currie Paris, MD;  Location: [facility] OR;  Service: General;  Laterality: N/A;   ?  ? ?Family History:  ?Family History  ?Problem Relation Age of Onset  ? Heart failure Father   ? Hypertension Father   ? Seizures Father   ? Heart disease Father   ? Cervical cancer Mother   ? Seizures Brother   ? Irritable bowel syndrome Brother   ? Cirrhosis Brother   ? Hypertension Brother   ?     x 4  ? Fibromyalgia Sister   ? Atrial fibrillation Sister   ? Prostate cancer Brother   ? Breast cancer Neg Hx   ? ?Social History:  ?Social History  ? ?Substance and  Sexual Activity  ?Alcohol Use No  ? Alcohol/week: 0.0 standard drinks  ?   ?Social History  ? ?Substance and Sexual Activity  ?Drug Use No  ?  ?Social History  ? ?Socioeconomic History  ? Marital status: Divorced  ?  Spouse name: Not on file  ? Number of children: 2  ? Years of education: Not on file  ? Highest education level: Not on file  ?Occupational History  ? Occupation: Patient Care Customer Service  ?  Employer: [employer]  ?Tobacco Use  ? Smoking status: Former  ?  Types: Cigarettes  ?  Quit date: 05/01/1999  ?  Years since quitting: 22.4  ? Smokeless tobacco: Never  ?Substance and Sexual Activity  ? Alcohol use: No  ?  Alcohol/week: 0.0 standard drinks  ? Drug use: No  ? Sexual activity: Yes  ?  Partners: Male  ?Other Topics Concern  ? Not on file  ?Social History Narrative  ? Not on file  ? ?Social Determinants of Health  ? ?Financial Resource Strain: Not on file  ?Food  Insecurity: Not on file  ?Transportation Needs: Not on file  ?Physical Activity: Not on file  ?Stress: Not on file  ?Social Connections: Not on file  ? ?Additional Social History:  ?  ?  ?  ?  ?  ?  ?  ?  ?  ?  ?  ? ?Sleep: Good ? ?Appetite:  Good ? ?Current Medications: ?Current Facility-Administered Medications  ?Medication Dose Route Frequency Provider Last Rate Last Admin  ? alum & mag hydroxide-simeth (MAALOX/MYLANTA) 200-200-20 MG/5ML suspension 30 mL  30 mL Oral Q4H PRN Charm Rings, NP   30 mL at 10/19/21 2122  ? carbamazepine (TEGRETOL) chewable tablet 50 mg  50 mg Oral BH-q8a4p Sarina Ill, DO   50 mg at 10/21/21 0712  ? cholecalciferol (VITAMIN D3) tablet 5,000 Units  5,000 Units Oral Daily Charm Rings, NP   5,000 Units at 10/21/21 1975  ? clonazePAM (KLONOPIN) tablet 0.5 mg  0.5 mg Oral TID Danelle Earthly, Kashif   0.5 mg at 10/21/21 0930  ? hydrOXYzine (ATARAX) tablet 25 mg  25 mg Oral QHS PRN Charm Rings, NP   25 mg at 10/12/21 2158  ? hydrOXYzine (ATARAX) tablet 50 mg  50 mg Oral QHS Sarina Ill, DO   50 mg at 10/20/21 2142  ? levothyroxine (SYNTHROID) tablet 100 mcg  100 mcg Oral Q0600 Charm Rings, NP   100 mcg at 10/21/21 0554  ? magnesium hydroxide (MILK OF MAGNESIA) suspension 30 mL  30 mL Oral Daily PRN Charm Rings, NP   30 mL at 10/16/21 2147  ? multivitamin with minerals tablet 1 tablet  1 tablet Oral Daily Charm Rings, NP   1 tablet at 10/21/21 8832  ? ondansetron (ZOFRAN) tablet 4 mg  4 mg Oral Q6H PRN Charm Rings, NP      ? Or  ? ondansetron (ZOFRAN) injection 4 mg  4 mg Intravenous Q6H PRN Charm Rings, NP      ? risperiDONE (RISPERDAL) tablet 1 mg  1 mg Oral BH-q8a4p Sarina Ill, DO   1 mg at 10/21/21 5498  ? venlafaxine XR (EFFEXOR-XR) 24 hr capsule 225 mg  225 mg Oral QPC breakfast Sarina Ill, DO   225 mg at 10/21/21 2641  ? ? ?Lab Results: No results found for this or any previous visit (from the past 48  hour(s)). ? ?Blood Alcohol  level:  ?Lab Results  ?Component Value Date  ? ETH <10 10/08/2021  ? ETH <10 10/06/2021  ? ? ?Metabolic Disorder Labs: ?Lab Results  ?Component Value Date  ? HGBA1C 5.1 10/17/2021  ? MPG 99.67 10/17/2021  ? MPG 91.06 09/09/2021  ? ?No results found for: PROLACTIN ?Lab Results  ?Component Value Date  ? CHOL 160 10/17/2021  ? TRIG 107 10/17/2021  ? HDL 53 10/17/2021  ? CHOLHDL 3.0 10/17/2021  ? VLDL 21 10/17/2021  ? LDLCALC 86 10/17/2021  ? LDLCALC 67 09/09/2021  ? ? ?Physical Findings: ?AIMS:  , ,  ,  ,    ?CIWA:    ?COWS:    ? ?Musculoskeletal: ?Strength & Muscle Tone: within normal limits ?Gait & Station: normal ?Patient leans: N/A ? ?Psychiatric Specialty Exam: ? ?Presentation  ?General Appearance: Appropriate for Environment; Casual; Fairly Groomed; Neat; Well Groomed ? ?Eye Contact:Fair ? ?Speech:Slow ? ?Speech Volume:Decreased ? ?Handedness:Right ? ? ?Mood and Affect  ?Mood:Anxious; Depressed; Irritable ? ?Affect:Restricted ? ? ?Thought Process  ?Thought Processes:Coherent ? ?Descriptions of Associations:Intact ? ?Orientation:Full (Time, Place and Person) ? ?Thought Content:Logical ? ?History of Schizophrenia/Schizoaffective disorder:No ? ?Duration of Psychotic Symptoms:No data recorded ?Hallucinations:No data recorded ?Ideas of Reference:None ? ?Suicidal Thoughts:No data recorded ?Homicidal Thoughts:No data recorded ? ?Sensorium  ?Memory:Immediate Fair; Remote Fair ? ?Judgment:Fair ? ?Insight:Fair ? ? ?Executive Functions  ?Concentration:Fair ? ?Attention Span:Fair ? ?Recall:Fair ? ?Fund of Knowledge:Fair ? ?Language:Fair ? ? ?Psychomotor Activity  ?Psychomotor Activity:No data recorded ? ?Assets  ?Assets:Communication Skills; Desire for Improvement; Financial Resources/Insurance; Housing; Social Support ? ? ?Sleep  ?Sleep:No data recorded ? ? ?Physical Exam: ?Physical Exam ?Vitals and nursing note reviewed.  ?Constitutional:   ?   Appearance: Normal appearance. She is normal weight.   ?Neurological:  ?   General: No focal deficit present.  ?   Mental Status: She is alert and oriented to person, place, and time.  ?Psychiatric:     ?   Attention and Perception: Attention and perception normal.

## 2021-10-22 NOTE — Progress Notes (Signed)
Recreation Therapy Notes ? ? ?Date: 10/22/2021 ? ?Time: 2:05pm    ? ?Location: Courtyard   ? ?Behavioral response: Appropriate ? ?Intervention Topic: Communication   ? ?Discussion/Intervention:  ?Group content today was focused on communication. The group defined communication and ways to communicate with others. Individuals stated reason why communication is important and some reasons to communicate with others. Patients expressed if they thought they were good at communicating with others and ways they could improve their communication skills. The group identified important parts of communication and some experiences they have had in the past with communication. The group participated in the intervention ?What is that??, where they had a chance to test out their communication skills and identify ways to improve their communication techniques. ?Clinical Observations/Feedback: ?Patient came to group and identified communicating face to face as her preference of communication. She expressed has past experiences with communication. Individual was social with peers and staff while participating in the intervention.    ?Francessca Friis LRT/CTRS  ? ? ? ? ? ? ? ?Doneisha Ivey ?10/22/2021 3:50 PM ?

## 2021-10-22 NOTE — Progress Notes (Signed)
Pt sitting in day room watching tv; calm, cooperative. Pt states "I feeling pretty good." She denies pain and SI/HI/AVH at this time. She reports that she does not have any problems falling asleep but she is waking around 4:30am, which is earlier than she would like, and has trouble falling back to sleep. She thinks that she may be drinking fluids too late at night because she gets up about 3 times during the night to use the bathroom. She describes her appetite as "good". She currently denies anxiety but expresses feelings of depression, which she rates 3/10. No acute distress noted. ?

## 2021-10-22 NOTE — Plan of Care (Signed)
Patient is alert and oriented X4, calm and compliant during assessment. Report sleeping well. Denies pain or discomfort. Denies anxiety and depression. Denies SI, HI, AVH. Ate breakfast in the day room among peers and tolerated well. Compliant with all due medications. Remain safe with Q 15 minute safety checks. ? ?Problem: Education: ?Goal: Knowledge of [facility] Education information/materials will improve ?Outcome: Progressing ?Goal: Emotional status will improve ?Outcome: Progressing ?Goal: Mental status will improve ?Outcome: Progressing ?Goal: Verbalization of understanding the information provided will improve ?Outcome: Progressing ?  ?Problem: Activity: ?Goal: Interest or engagement in activities will improve ?Outcome: Progressing ?Goal: Sleeping patterns will improve ?Outcome: Progressing ?  ?Problem: Coping: ?Goal: Ability to verbalize frustrations and anger appropriately will improve ?Outcome: Progressing ?Goal: Ability to demonstrate self-control will improve ?Outcome: Progressing ?  ?Problem: Health Behavior/Discharge Planning: ?Goal: Identification of resources available to assist in meeting health care needs will improve ?Outcome: Progressing ?Goal: Compliance with treatment plan for underlying cause of condition will improve ?Outcome: Progressing ?  ?Problem: Physical Regulation: ?Goal: Ability to maintain clinical measurements within normal limits will improve ?Outcome: Progressing ?  ?Problem: Safety: ?Goal: Periods of time without injury will increase ?Outcome: Progressing ?  ?

## 2021-10-22 NOTE — Progress Notes (Signed)
Pt sitting on edge of bed in room; calm, cooperative. Pt states "I'm feeling good." She currently denies pain, SI/HI/AVH. She denies problems falling asleep and staying asleep, stating that she sleeps throughout the night "most of the time". She denies anxiety at this time but expresses feelings of depression, stating "Depression is like a cloud; it's always there." No acute distress noted. ?

## 2021-10-22 NOTE — Progress Notes (Signed)
[facility] MD Progress Note ? ?10/22/2021 12:01 PM ?[place]  ?MRN:  [id number] ?Subjective: And is seen on rounds today.  She tells me that her friend that she was going to stay with had her family problems resolved.  That makes her feel better.  She is still planning on living with her daughter for a couple weeks after discharge.  She is in a better mood today.  I told her that we would leave the medications alone for now but if her anxiety and depression worsened and we could always go up on the Effexor.  She still has some fatigue from the Risperdal but she states when she is occupied it does not bother her and she no longer has suicidal thoughts. ? ?Principal Problem: Major depressive disorder, recurrent severe without psychotic features ([place]) ?Diagnosis: Principal Problem: ?  Major depressive disorder, recurrent severe without psychotic features ([place]) ? ?Total Time spent with patient: 15 minutes ? ?Past Psychiatric History: [facility] ?Ival Bible, MD,  patient has a past psychiatric history of MDD and anxiety.  She is prescribed Xanax 0.5 mg tablets twice daily. She presented to [facility] on 09/07/2021 with increased depression and anxiety.  She was prescribed Lexapro 5 mg daily, with an appointment to see Dr. De Nurse for medication management on 09/12/2021.  Recently discharged from old [place]. ? ?Past Medical History:  ?Past Medical History:  ?Diagnosis Date  ? Allergy   ? Anxiety   ? Arrhythmia   ? "does not see cardiologist, sees pcp, Dr. Christie Nottingham  ? Asthma   ? DX IN MARCH 2012 "allergy induced asthma"  ? Bronchitis   ? hx of  ? Depression   ? "past hx of for Panic attacks, not on medication at this time"  ? Family history of anesthesia complication   ? mother "nausea and vomiting post surger"  ? GERD (gastroesophageal reflux disease)   ? Goiter 03/17/2012  ? 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  Heartburn   ? Herpes   ? on medication for outbreaks only  ? Hypertension   ? Seizures ([place])   ? MEDICATION INDUCED  ? Urinary tract infection   ? hx of  ?  ?Past Surgical History:  ?Procedure Laterality Date  ? CESAREAN SECTION    ? x 2  ? COLONOSCOPY    ? ELBOW SURGERY    ? right  ? KNEE ARTHROSCOPY    ? right  ? PILONIDAL CYST EXCISION    ? spinal cystectomy    ? "where pilonidal cyst was"  ? THYROIDECTOMY  04/07/2012  ? Procedure: THYROIDECTOMY;  Surgeon: Haywood Lasso, MD;  Location: [place];  Service: General;  Laterality: N/A;   ?  ? ?Family History:  ?Family History  ?Problem Relation Age of Onset  ? Heart failure Father   ? Hypertension Father   ? Seizures Father   ? Heart disease Father   ? Cervical cancer Mother   ? Seizures Brother   ? Irritable bowel syndrome Brother   ? Cirrhosis Brother   ? Hypertension Brother   ?     x 4  ? Fibromyalgia Sister   ? Atrial fibrillation Sister   ? Prostate cancer Brother   ? Breast cancer Neg Hx   ? ? ?Social History:  ?Social History  ? ?Substance and Sexual Activity  ?Alcohol Use No  ? Alcohol/week: 0.0 standard drinks  ?   ?  Social History  ? ?Substance and Sexual Activity  ?Drug Use No  ?  ?Social History  ? ?Socioeconomic History  ? Marital status: Divorced  ?  Spouse name: Not on file  ? Number of children: 2  ? Years of education: Not on file  ? Highest education level: Not on file  ?Occupational History  ? Occupation: Patient Care Customer Service  ?  Employer: [employer]  ?Tobacco Use  ? Smoking status: Former  ?  Types: Cigarettes  ?  Quit date: 05/01/1999  ?  Years since quitting: 22.4  ? Smokeless tobacco: Never  ?Substance and Sexual Activity  ? Alcohol use: No  ?  Alcohol/week: 0.0 standard drinks  ? Drug use: No  ? Sexual activity: Yes  ?  Partners: Male  ?Other Topics Concern  ? Not on file  ?Social History Narrative  ? Not on file  ? ?Social Determinants of Health  ? ?Financial Resource Strain: Not on file  ?Food Insecurity: Not on file  ?Transportation  Needs: Not on file  ?Physical Activity: Not on file  ?Stress: Not on file  ?Social Connections: Not on file  ? ?Additional Social History:  ?  ?  ?  ?  ?  ?  ?  ?  ?  ?  ?  ? ?Sleep: Good ? ?Appetite:  Good ? ?Current Medications: ?Current Facility-Administered Medications  ?Medication Dose Route Frequency Provider Last Rate Last Admin  ? alum & mag hydroxide-simeth (MAALOX/MYLANTA) 200-200-20 MG/5ML suspension 30 mL  30 mL Oral Q4H PRN Patrecia Pour, NP   30 mL at 10/19/21 2122  ? carbamazepine (TEGRETOL) chewable tablet 50 mg  50 mg Oral BH-q8a4p Parks Ranger, DO   50 mg at 10/22/21 [id number]  ? cholecalciferol (VITAMIN D3) tablet 5,000 Units  5,000 Units Oral Daily Patrecia Pour, NP   5,000 Units at 10/22/21 [id number]  ? clonazePAM (KLONOPIN) tablet 0.5 mg  0.5 mg Oral TID Wynell Balloon, Kashif   0.5 mg at 10/22/21 [id number]  ? hydrOXYzine (ATARAX) tablet 25 mg  25 mg Oral QHS PRN Patrecia Pour, NP   25 mg at 10/12/21 2158  ? hydrOXYzine (ATARAX) tablet 50 mg  50 mg Oral QHS Parks Ranger, DO   50 mg at 10/21/21 2120  ? levothyroxine (SYNTHROID) tablet 100 mcg  100 mcg Oral Q0600 Patrecia Pour, NP   100 mcg at 10/22/21 0559  ? magnesium hydroxide (MILK OF MAGNESIA) suspension 30 mL  30 mL Oral Daily PRN Patrecia Pour, NP   30 mL at 10/16/21 2147  ? multivitamin with minerals tablet 1 tablet  1 tablet Oral Daily Patrecia Pour, NP   1 tablet at 10/22/21 [id number]  ? ondansetron (ZOFRAN) tablet 4 mg  4 mg Oral Q6H PRN Patrecia Pour, NP      ? Or  ? ondansetron (ZOFRAN) injection 4 mg  4 mg Intravenous Q6H PRN Patrecia Pour, NP      ? risperiDONE (RISPERDAL) tablet 1 mg  1 mg Oral BH-q8a4p Parks Ranger, DO   1 mg at 10/22/21 [id number]  ? venlafaxine XR (EFFEXOR-XR) 24 hr capsule 225 mg  225 mg Oral QPC breakfast Parks Ranger, DO   225 mg at 10/22/21 [id number]  ? ? ?Lab Results: No results found for this or any previous visit (from the past 48 hour(s)). ? ?Blood Alcohol level:  ?Lab Results   ?Component Value Date  ? ETH <10 10/08/2021  ? ETH <  10 10/06/2021  ? ? ?Metabolic Disorder Labs: ?Lab Results  ?Component Value Date  ? HGBA1C 5.1 10/17/2021  ? MPG 99.67 10/17/2021  ? MPG 91.06 09/09/2021  ? ?No results found for: PROLACTIN ?Lab Results  ?Component Value Date  ? CHOL 160 10/17/2021  ? TRIG 107 10/17/2021  ? HDL 53 10/17/2021  ? CHOLHDL 3.0 10/17/2021  ? VLDL 21 10/17/2021  ? [place] 86 10/17/2021  ? [place] 67 09/09/2021  ? ? ?Physical Findings: ?AIMS:  , ,  ,  ,    ?CIWA:    ?COWS:    ? ?Musculoskeletal: ?Strength & Muscle Tone: within normal limits ?Gait & Station: normal ?Patient leans: N/A ? ?Psychiatric Specialty Exam: ? ?Presentation  ?General Appearance: Appropriate for Environment; Casual; Fairly Groomed; Neat; Well Groomed ? ?Eye Contact:Fair ? ?Speech:Slow ? ?Speech Volume:Decreased ? ?Handedness:Right ? ? ?Mood and Affect  ?Mood:Anxious; Depressed; Irritable ? ?Affect:Restricted ? ? ?Thought Process  ?Thought Processes:Coherent ? ?Descriptions of Associations:Intact ? ?Orientation:Full (Time, Place and Person) ? ?Thought Content:Logical ? ?History of Schizophrenia/Schizoaffective disorder:No ? ?Duration of Psychotic Symptoms:No data recorded ?Hallucinations:No data recorded ?Ideas of Reference:None ? ?Suicidal Thoughts:No data recorded ?Homicidal Thoughts:No data recorded ? ?Sensorium  ?Memory:Immediate Fair; Remote Fair ? ?Judgment:Fair ? ?Insight:Fair ? ? ?Executive Functions  ?Concentration:Fair ? ?Attention Span:Fair ? ?Recall:Fair ? ?[place] ? ?Language:Fair ? ? ?Psychomotor Activity  ?Psychomotor Activity:No data recorded ? ?Assets  ?Assets:Communication Skills; Desire for Improvement; Financial Resources/Insurance; Housing; Social Support ? ? ?Sleep  ?Sleep:No data recorded ? ? ?Physical Exam: ?Physical Exam ?Vitals and nursing note reviewed.  ?Constitutional:   ?   Appearance: Normal appearance. She is normal weight.  ?Neurological:  ?   General: No focal deficit  present.  ?   Mental Status: She is alert and oriented to person, place, and time.  ?Psychiatric:     ?   Attention and Perception: Attention and perception normal.     ?   Mood and Affect: Mood is depressed. Affec

## 2021-10-22 NOTE — BH IP Treatment Plan (Signed)
Interdisciplinary Treatment and Diagnostic Plan Update ? ?10/22/2021 ?Time of Session: 9:30AM ?Colleen [name] ?MRN: [id number] ? ?Principal Diagnosis: Major depressive disorder, recurrent severe without psychotic features ([place]) ? ?Secondary Diagnoses: Principal Problem: ?  Major depressive disorder, recurrent severe without psychotic features ([place]) ? ? ?Current Medications:  ?Current Facility-Administered Medications  ?Medication Dose Route Frequency Provider Last Rate Last Admin  ? alum & mag hydroxide-simeth (MAALOX/MYLANTA) 200-200-20 MG/5ML suspension 30 mL  30 mL Oral Q4H PRN Patrecia Pour, NP   30 mL at 10/19/21 2122  ? carbamazepine (TEGRETOL) chewable tablet 50 mg  50 mg Oral BH-q8a4p Parks Ranger, DO   50 mg at 10/22/21 [id number]  ? cholecalciferol (VITAMIN D3) tablet 5,000 Units  5,000 Units Oral Daily Patrecia Pour, NP   5,000 Units at 10/22/21 [id number]  ? clonazePAM (KLONOPIN) tablet 0.5 mg  0.5 mg Oral TID Wynell Balloon, Kashif   0.5 mg at 10/22/21 [id number]  ? hydrOXYzine (ATARAX) tablet 25 mg  25 mg Oral QHS PRN Patrecia Pour, NP   25 mg at 10/12/21 2158  ? hydrOXYzine (ATARAX) tablet 50 mg  50 mg Oral QHS Parks Ranger, DO   50 mg at 10/21/21 2120  ? levothyroxine (SYNTHROID) tablet 100 mcg  100 mcg Oral Q0600 Patrecia Pour, NP   100 mcg at 10/22/21 0559  ? magnesium hydroxide (MILK OF MAGNESIA) suspension 30 mL  30 mL Oral Daily PRN Patrecia Pour, NP   30 mL at 10/16/21 2147  ? multivitamin with minerals tablet 1 tablet  1 tablet Oral Daily Patrecia Pour, NP   1 tablet at 10/22/21 [id number]  ? ondansetron (ZOFRAN) tablet 4 mg  4 mg Oral Q6H PRN Patrecia Pour, NP      ? Or  ? ondansetron (ZOFRAN) injection 4 mg  4 mg Intravenous Q6H PRN Patrecia Pour, NP      ? risperiDONE (RISPERDAL) tablet 1 mg  1 mg Oral BH-q8a4p Parks Ranger, DO   1 mg at 10/22/21 [id number]  ? venlafaxine XR (EFFEXOR-XR) 24 hr capsule 225 mg  225 mg Oral QPC breakfast Parks Ranger, DO   225 mg at  10/22/21 [id number]  ? ?PTA Medications: ?Medications Prior to Admission  ?Medication Sig Dispense Refill Last Dose  ? carbamazepine (TEGRETOL) 100 MG chewable tablet Chew 0.5 tablets (50 mg total) by mouth daily with supper. 60 tablet 0   ? escitalopram (LEXAPRO) 10 MG tablet Take 0.5 tablets (5 mg total) by mouth daily. 7 tablet 0   ? levothyroxine (SYNTHROID) 100 MCG tablet Take 1 tablet (100 mcg total) by mouth daily. 90 tablet 1   ? losartan (COZAAR) 50 MG tablet Take 1 tablet (50 mg total) by mouth daily.     ? Multiple Vitamin (MULTIVITAMIN) tablet Take 1 tablet by mouth daily.     ? ? ?Patient Stressors: Health problems   ? ?Patient Strengths: Ability for insight  ?Supportive family/friends  ? ?Treatment Modalities: Medication Management, Group therapy, Case management,  ?1 to 1 session with clinician, Psychoeducation, Recreational therapy. ? ? ?Physician Treatment Plan for Primary Diagnosis: Major depressive disorder, recurrent severe without psychotic features ([place]) ?Long Term Goal(s): Improvement in symptoms so as ready for discharge  ? ?Short Term Goals: Ability to maintain clinical measurements within normal limits will improve ?Compliance with prescribed medications will improve ?Ability to identify triggers associated with substance abuse/mental health issues will improve ?Ability to verbalize feelings will improve ?Ability to disclose and discuss  suicidal ideas ?Ability to identify and develop effective coping behaviors will improve ? ?Medication Management: Evaluate patient's response, side effects, and tolerance of medication regimen. ? ?Therapeutic Interventions: 1 to 1 sessions, Unit Group sessions and Medication administration. ? ?Evaluation of Outcomes: Progressing ? ?Physician Treatment Plan for Secondary Diagnosis: Principal Problem: ?  Major depressive disorder, recurrent severe without psychotic features ([place]) ? ?Long Term Goal(s): Improvement in symptoms so as ready for discharge  ? ?Short Term  Goals: Ability to maintain clinical measurements within normal limits will improve ?Compliance with prescribed medications will improve ?Ability to identify triggers associated with substance abuse/mental health issues will improve ?Ability to verbalize feelings will improve ?Ability to disclose and discuss suicidal ideas ?Ability to identify and develop effective coping behaviors will improve    ? ?Medication Management: Evaluate patient's response, side effects, and tolerance of medication regimen. ? ?Therapeutic Interventions: 1 to 1 sessions, Unit Group sessions and Medication administration. ? ?Evaluation of Outcomes: Adequate for Discharge ? ? ?RN Treatment Plan for Primary Diagnosis: Major depressive disorder, recurrent severe without psychotic features ([place]) ?Long Term Goal(s): Knowledge of disease and therapeutic regimen to maintain health will improve ? ?Short Term Goals: Ability to remain free from injury will improve, Ability to verbalize frustration and anger appropriately will improve, Ability to demonstrate self-control, Ability to participate in decision making will improve, Ability to verbalize feelings will improve, Ability to disclose and discuss suicidal ideas, Ability to identify and develop effective coping behaviors will improve, and Compliance with prescribed medications will improve ? ?Medication Management: RN will administer medications as ordered by provider, will assess and evaluate patient's response and provide education to patient for prescribed medication. RN will report any adverse and/or side effects to prescribing provider. ? ?Therapeutic Interventions: 1 on 1 counseling sessions, Psychoeducation, Medication administration, Evaluate responses to treatment, Monitor vital signs and CBGs as ordered, Perform/monitor CIWA, COWS, AIMS and Fall Risk screenings as ordered, Perform wound care treatments as ordered. ? ?Evaluation of Outcomes: Adequate for Discharge ? ? ?LCSW Treatment Plan  for Primary Diagnosis: Major depressive disorder, recurrent severe without psychotic features ([place]) ?Long Term Goal(s): Safe transition to appropriate next level of care at discharge, Engage patient in therapeutic group addressing interpersonal concerns. ? ?Short Term Goals: Engage patient in aftercare planning with referrals and resources, Increase social support, Increase ability to appropriately verbalize feelings, Increase emotional regulation, Facilitate acceptance of mental health diagnosis and concerns, Identify triggers associated with mental health/substance abuse issues, and Increase skills for wellness and recovery ? ?Therapeutic Interventions: Assess for all discharge needs, 1 to 1 time with Education officer, museum, Explore available resources and support systems, Assess for adequacy in community support network, Educate family and significant other(s) on suicide prevention, Complete Psychosocial Assessment, Interpersonal group therapy. ? ?Evaluation of Outcomes: Adequate for Discharge ? ? ?Progress in Treatment: ?Attending groups: Yes. ?Participating in groups: Yes. ?Taking medication as prescribed: Yes. ?Toleration medication: Yes. ?Family/Significant other contact made: Yes, individual(s) contacted:  SPE completed with Tressa Busman, friend ?Patient understands diagnosis: Yes. ?Discussing patient identified problems/goals with staff: Yes. ?Medical problems stabilized or resolved: Yes. ?Denies suicidal/homicidal ideation: Yes. ?Issues/concerns per patient self-inventory: No. ?Other: None ? ?New problem(s) identified: No, Describe:  none ? ?New Short Term/Long Term Goal(s): Patient to work towards elimination of symptoms of psychosis, medication management for mood stabilization; elimination of SI thoughts; development of comprehensive mental wellness plan. Update 10/17/2021: No changes at this time. Update 10/22/21: No changes at this time.  ? ? ?Patient Goals:  Patient  states "I want to get to the level where I  am not going to fear going home." Update 10/17/2021: No changes at this time. Update 10/22/21: No changes at this time.  ?  ?Discharge Plan or Barriers: No psychosocial barriers identified at this time, pat

## 2021-10-22 NOTE — [facility] Group Notes (Signed)
[facility] Group Notes:  (Nursing/MHT/Case Management/Adjunct) ? ?Date:  10/22/2021  ?Time:  10:00 AM ? ?Type of Therapy:  Psychoeducational Skills ? ?Participation Level:  Active ? ?Participation Quality:  Appropriate ? ?Affect:  Appropriate ? ?Cognitive:  Appropriate ? ?Insight:  Appropriate ? ?Engagement in Group:  Engaged ? ?Modes of Intervention:  Discussion ? ?Summary of Progress/Problems: ?The pt attended group and was actively engaged during discussion. ?Barbaraann Rondo ?10/22/2021, 11:23 AM ?

## 2021-10-23 NOTE — [facility] Group Notes (Signed)
[facility] Group Notes:  (Nursing/MHT/Case Management/Adjunct) ? ?Date:  10/23/2021  ?Time:  10:00 AM ? ?Type of Therapy:  Psychoeducational Skills ? ?Participation Level:  Active ? ?Participation Quality:  Appropriate ? ?Affect:  Appropriate ? ?Cognitive:  Appropriate ? ?Insight:  Appropriate ? ?Engagement in Group:  Engaged ? ?Modes of Intervention:  Discussion ? ?Summary of Progress/Problems: ?The pt attended group and was very open to discussion. ?Barbaraann Rondo ?10/23/2021, 11:41 AM ?

## 2021-10-23 NOTE — Progress Notes (Addendum)
Recreation Therapy Notes ? ?Date: 10/23/2021 ?  ?Time: 1:45pm  ?  ?Location: Dayroom  ?  ?Behavioral response: Appropriate ?  ?Intervention Topic: Relaxation   ?  ?Discussion/Intervention:  ?Group content today was focused on relaxation. The group defined relaxation and identified healthy ways to relax. Individuals expressed how much time they spend relaxing. Patients expressed how much their life would be if they did not make time for themselves to relax. The group stated ways they could improve their relaxation techniques in the future.  Individuals participated in the intervention ?Time to Relax? where they had a chance to experience different relaxation techniques.  ?Clinical Observations/Feedback: ?Patient came to group and identified deep breathing as how she relaxes. She explained that stress stops her from relaxing. Individual was social with peers and staff while participating in the intervention.    ?Ellianah Cordy LRT/CTRS  ? ? ? ? ? ? ? ?Macen Joslin ?10/23/2021 2:36 PM ?

## 2021-10-23 NOTE — Plan of Care (Signed)
Patient remain alert, pleasant and cooperative with assessment.  Denies pain or discomfort at this time. Report sleeping good through the night. Denies anxiety and depression. Denies SI, HI, and AVH. Report appetite good. Ate breakfast in the day room among staff and peers and tolerated well.  Compliant with due medication. Remain safe on the unit with Q15 minute safety check. ? ?Problem: Education: ?Goal: Knowledge of [facility] Education information/materials will improve ?Outcome: Progressing ?Goal: Emotional status will improve ?Outcome: Progressing ?Goal: Mental status will improve ?Outcome: Progressing ?Goal: Verbalization of understanding the information provided will improve ?Outcome: Progressing ?  ?Problem: Activity: ?Goal: Interest or engagement in activities will improve ?Outcome: Progressing ?Goal: Sleeping patterns will improve ?Outcome: Progressing ?  ?Problem: Coping: ?Goal: Ability to verbalize frustrations and anger appropriately will improve ?Outcome: Progressing ?Goal: Ability to demonstrate self-control will improve ?Outcome: Progressing ?  ?Problem: Health Behavior/Discharge Planning: ?Goal: Identification of resources available to assist in meeting health care needs will improve ?Outcome: Progressing ?Goal: Compliance with treatment plan for underlying cause of condition will improve ?Outcome: Progressing ?  ?Problem: Physical Regulation: ?Goal: Ability to maintain clinical measurements within normal limits will improve ?Outcome: Progressing ?  ?Problem: Safety: ?Goal: Periods of time without injury will increase ?Outcome: Progressing ?  ?

## 2021-10-23 NOTE — B[facility] Counselor (Signed)
CSW contacted pt's daughter, Annett Fabian, [phone], who pt provided verbal permission to talk with, regarding discharge.  ? ?She stated that she could provide transportation depending on the date of discharge, CSW will contact daughter once decided discharge date has been set. CSW will discuss with pt other family members or friends that can provide transportation.  ? ?CSW informed daughter the follow up for outpatient mental health services will be set up at discharge and daughter recommended that pt be seen in an IOP program. CSW will follow up with pt regarding interest in referral.  ? ?She requested that scripts for medications be sent to [facility] in [place] on [address] since it's closer to daughter's home, where pt will be discharging to.  ? ?No other requests were made. Conversation ended without incident.  ? ? ?Nikol Lemar [place], MSW, LCSW-A ?4/14/20232:09 PM  ?

## 2021-10-23 NOTE — Progress Notes (Signed)
[facility] ? ?10/23/2021 3:05 PM ?Colleen MuskratAnne Kathleen [name]  ?MRN:  [id number] ?Subjective: Patient seen for follow-up.  71 year old woman who came into the hospital with what sounds like either a psychotic depression or at least a depression with nearly psychotic levels of obsessive anxiety.  She reports that she is feeling much better than she was when she came in.  She is tolerating the current dose of Effexor and Risperdal however she expressed some concern to me that the Risperdal may be making her feel too sedated.  She says she is no longer having intrusive intense thoughts about killing herself but still feels anxious and depressed. ?Principal Problem: Major depressive disorder, recurrent severe without psychotic features ([facility]) ?Diagnosis: Principal Problem: ?  Major depressive disorder, recurrent severe without psychotic features ([facility]) ? ?Total Time spent with patient: 30 minutes ? ?Past Psychiatric History: Past history of recurrent depression ? ?Past Medical History:  ?Past Medical History:  ?Diagnosis Date  ? Allergy   ? Anxiety   ? Arrhythmia   ? "does not see cardiologist, sees pcp, Dr. Alonza SmokerJeff Todd  ? Asthma   ? DX IN MARCH 2012 "allergy induced asthma"  ? Bronchitis   ? hx of  ? Depression   ? "past hx of for Panic attacks, not on medication at this time"  ? Family history of anesthesia complication   ? mother "nausea and vomiting post surger"  ? GERD (gastroesophageal reflux disease)   ? Goiter 03/17/2012  ? 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 Heartburn   ? Herpes   ? on medication for outbreaks only  ? Hypertension   ? Seizures ([facility])   ? MEDICATION INDUCED  ? Urinary tract infection   ? hx of  ?  ?Past Surgical History:  ?Procedure Laterality Date  ? CESAREAN SECTION    ? x 2  ? COLONOSCOPY    ? ELBOW SURGERY    ? right  ? KNEE ARTHROSCOPY    ? right  ? PILONIDAL CYST EXCISION    ? spinal cystectomy    ? "where pilonidal  cyst was"  ? THYROIDECTOMY  04/07/2012  ? Procedure: THYROIDECTOMY;  Surgeon: Currie Parishristian J Streck, MD;  [facility];  Service: General;  Laterality: N/A;   ?  ? ?Family History:  ?Family History  ?Problem Relation Age of Onset  ? Heart failure Father   ? Hypertension Father   ? Seizures Father   ? Heart disease Father   ? Cervical cancer Mother   ? Seizures Brother   ? Irritable bowel syndrome Brother   ? Cirrhosis Brother   ? Hypertension Brother   ?     x 4  ? Fibromyalgia Sister   ? Atrial fibrillation Sister   ? Prostate cancer Brother   ? Breast cancer Neg Hx   ? ?Family Psychiatric  History: See previous ?Social History:  ?Social History  ? ?Substance and Sexual Activity  ?Alcohol Use No  ? Alcohol/week: 0.0 standard drinks  ?   ?Social History  ? ?Substance and Sexual Activity  ?Drug Use No  ?  ?Social History  ? ?Socioeconomic History  ? Marital status: Divorced  ?  Spouse name: Not on file  ? Number of children: 2  ? Years of education: Not on file  ? Highest education level: Not on file  ?Occupational History  ? Occupation: Patient Care Customer Service  ?  Employer: [employer]  ?Tobacco  Use  ? Smoking status: Former  ?  Types: Cigarettes  ?  Quit date: 05/01/1999  ?  Years since quitting: 22.4  ? Smokeless tobacco: Never  ?Substance and Sexual Activity  ? Alcohol use: No  ?  Alcohol/week: 0.0 standard drinks  ? Drug use: No  ? Sexual activity: Yes  ?  Partners: Male  ?Other Topics Concern  ? Not on file  ?Social History Narrative  ? Not on file  ? ?Social Determinants of Health  ? ?Financial Resource Strain: Not on file  ?Food Insecurity: Not on file  ?Transportation Needs: Not on file  ?Physical Activity: Not on file  ?Stress: Not on file  ?Social Connections: Not on file  ? ?Additional Social History:  ?  ?  ?  ?  ?  ?  ?  ?  ?  ?  ?  ? ?Sleep: Fair ? ?Appetite:  Fair ? ?Current Medications: ?Current Facility-Administered Medications  ?Medication Dose Route Frequency Provider Last Rate Last Admin  ? alum  & mag hydroxide-simeth (MAALOX/MYLANTA) 200-200-20 MG/5ML suspension 30 mL  30 mL Oral Q4H PRN Charm Rings, NP   30 mL at 10/19/21 2122  ? carbamazepine (TEGRETOL) chewable tablet 50 mg  50 mg Oral BH-q8a4p Sarina Ill, DO   50 mg at 10/23/21 0815  ? cholecalciferol (VITAMIN D3) tablet 5,000 Units  5,000 Units Oral Daily Charm Rings, NP   5,000 Units at 10/23/21 1610  ? clonazePAM (KLONOPIN) tablet 0.5 mg  0.5 mg Oral TID Danelle Earthly, Kashif   0.5 mg at 10/23/21 9604  ? hydrOXYzine (ATARAX) tablet 25 mg  25 mg Oral QHS PRN Charm Rings, NP   25 mg at 10/12/21 2158  ? hydrOXYzine (ATARAX) tablet 50 mg  50 mg Oral QHS Sarina Ill, DO   50 mg at 10/22/21 2127  ? levothyroxine (SYNTHROID) tablet 100 mcg  100 mcg Oral Q0600 Charm Rings, NP   100 mcg at 10/23/21 5409  ? magnesium hydroxide (MILK OF MAGNESIA) suspension 30 mL  30 mL Oral Daily PRN Charm Rings, NP   30 mL at 10/16/21 2147  ? multivitamin with minerals tablet 1 tablet  1 tablet Oral Daily Charm Rings, NP   1 tablet at 10/23/21 0950  ? ondansetron (ZOFRAN) tablet 4 mg  4 mg Oral Q6H PRN Charm Rings, NP      ? Or  ? ondansetron (ZOFRAN) injection 4 mg  4 mg Intravenous Q6H PRN Charm Rings, NP      ? risperiDONE (RISPERDAL) tablet 1 mg  1 mg Oral BH-q8a4p Sarina Ill, DO   1 mg at 10/23/21 0815  ? venlafaxine XR (EFFEXOR-XR) 24 hr capsule 225 mg  225 mg Oral QPC breakfast Sarina Ill, DO   225 mg at 10/23/21 8119  ? ? ?Lab Results: No results found for this or any previous visit (from the past 48 hour(s)). ? ?Blood Alcohol level:  ?Lab Results  ?Component Value Date  ? ETH <10 10/08/2021  ? ETH <10 10/06/2021  ? ? ?Metabolic Disorder Labs: ?Lab Results  ?Component Value Date  ? HGBA1C 5.1 10/17/2021  ? MPG 99.67 10/17/2021  ? MPG 91.06 09/09/2021  ? ?No results found for: PROLACTIN ?Lab Results  ?Component Value Date  ? CHOL 160 10/17/2021  ? TRIG 107 10/17/2021  ? HDL 53 10/17/2021  ?  CHOLHDL 3.0 10/17/2021  ? VLDL 21 10/17/2021  ? LDLCALC 86 10/17/2021  ?  LDLCALC 67 09/09/2021  ? ? ?Physical Findings: ?AIMS:  , ,  ,  ,    ?CIWA:    ?COWS:    ? ?Musculoskeletal: ?Strength & Muscle Tone: within normal limits ?Gait & Station: normal ?Patient leans: N/A ? ?Psychiatric Specialty Exam: ? ?Presentation  ?General Appearance: Appropriate for Environment; Casual; Fairly Groomed; Neat; Well Groomed ? ?Eye Contact:Fair ? ?Speech:Slow ? ?Speech Volume:Decreased ? ?Handedness:Right ? ? ?Mood and Affect  ?Mood:Anxious; Depressed; Irritable ? ?Affect:Restricted ? ? ?Thought Process  ?Thought Processes:Coherent ? ?Descriptions of Associations:Intact ? ?Orientation:Full (Time, Place and Person) ? ?Thought Content:Logical ? ?History of Schizophrenia/Schizoaffective disorder:No ? ?Duration of Psychotic Symptoms:No data recorded ?Hallucinations:No data recorded ?Ideas of Reference:None ? ?Suicidal Thoughts:No data recorded ?Homicidal Thoughts:No data recorded ? ?Sensorium  ?Memory:Immediate Fair; Remote Fair ? ?Judgment:Fair ? ?Insight:Fair ? ? ?Executive Functions  ?Concentration:Fair ? ?Attention Span:Fair ? ?Recall:Fair ? ?Fund of Knowledge:Fair ? ?Language:Fair ? ? ?Psychomotor Activity  ?Psychomotor Activity:No data recorded ? ?Assets  ?Assets:Communication Skills; Desire for Improvement; Financial Resources/Insurance; Housing; Social Support ? ? ?Sleep  ?Sleep:No data recorded ? ? ?Physical Exam: ?Physical Exam ?Vitals and nursing note reviewed.  ?Constitutional:   ?   Appearance: Normal appearance.  ?HENT:  ?   Head: Normocephalic and atraumatic.  ?   Mouth/Throat:  ?   Pharynx: Oropharynx is clear.  ?Eyes:  ?   Pupils: Pupils are equal, round, and reactive to light.  ?Cardiovascular:  ?   Rate and Rhythm: Normal rate and regular rhythm.  ?Pulmonary:  ?   Effort: Pulmonary effort is normal.  ?   Breath sounds: Normal breath sounds.  ?Abdominal:  ?   General: Abdomen is flat.  ?   Palpations: Abdomen is soft.   ?Musculoskeletal:     ?   General: Normal range of motion.  ?Skin: ?   General: Skin is warm and dry.  ?Neurological:  ?   General: No focal deficit present.  ?   Mental Status: She is alert. Mental stat

## 2021-10-24 DIAGNOSIS — E871 Hypo-osmolality and hyponatremia: Secondary | ICD-10-CM

## 2021-10-24 LAB — CBC WITH DIFFERENTIAL/PLATELET
Abs Immature Granulocytes: 0.02 10*3/uL (ref 0.00–0.07)
Basophils Absolute: 0.1 10*3/uL (ref 0.0–0.1)
Basophils Relative: 2 %
Eosinophils Absolute: 0.2 10*3/uL (ref 0.0–0.5)
Eosinophils Relative: 5 %
HCT: 32.2 % — ABNORMAL LOW (ref 36.0–46.0)
Hemoglobin: 11.4 g/dL — ABNORMAL LOW (ref 12.0–15.0)
Immature Granulocytes: 1 %
Lymphocytes Relative: 26 %
Lymphs Abs: 1 10*3/uL (ref 0.7–4.0)
MCH: 32.5 pg (ref 26.0–34.0)
MCHC: 35.4 g/dL (ref 30.0–36.0)
MCV: 91.7 fL (ref 80.0–100.0)
Monocytes Absolute: 0.5 10*3/uL (ref 0.1–1.0)
Monocytes Relative: 12 %
Neutro Abs: 2.2 10*3/uL (ref 1.7–7.7)
Neutrophils Relative %: 54 %
Platelets: 212 10*3/uL (ref 150–400)
RBC: 3.51 MIL/uL — ABNORMAL LOW (ref 3.87–5.11)
RDW: 11.7 % (ref 11.5–15.5)
WBC: 3.9 10*3/uL — ABNORMAL LOW (ref 4.0–10.5)
nRBC: 0 % (ref 0.0–0.2)

## 2021-10-24 LAB — BASIC METABOLIC PANEL
Anion gap: 7 (ref 5–15)
BUN: 13 mg/dL (ref 8–23)
CO2: 25 mmol/L (ref 22–32)
Calcium: 8.6 mg/dL — ABNORMAL LOW (ref 8.9–10.3)
Chloride: 96 mmol/L — ABNORMAL LOW (ref 98–111)
Creatinine, Ser: 0.9 mg/dL (ref 0.44–1.00)
GFR, Estimated: >60 mL/min (ref >60)
Glucose, Bld: 84 mg/dL (ref 70–99)
Potassium: 4.9 mmol/L (ref 3.5–5.1)
Sodium: 128 mmol/L — ABNORMAL LOW (ref 135–145)

## 2021-10-24 LAB — SODIUM, URINE, RANDOM: Sodium, Ur: 38 mmol/L

## 2021-10-24 NOTE — Progress Notes (Signed)
[facility] MD Progress Note ? ?10/24/2021 12:42 PM ?Colleen [name]  ?MRN:  [id number] ?Subjective: Follow-up for patient with depression.  Patient seen and chart reviewed.  She tells me that she is feeling okay about the same as yesterday.  No active suicidal ideation.  Still feels a little bit fatigued but not as much as yesterday.  We discussed her medications and she prefers to leave things as they are for right now.  Denies suicidal ideation denies psychosis.  I rechecked her labs and found that she is actually become even more hyponatremic now with a sodium of 128.  Serum osmolality is calculated out as being a little on the low side. ?Principal Problem: Major depressive disorder, recurrent severe without psychotic features (HCC) ?Diagnosis: Principal Problem: ?  Major depressive disorder, recurrent severe without psychotic features (HCC) ?Active Problems: ?  Hyponatremia ? ?Total Time spent with patient: 30 minutes ? ?Past Psychiatric History: Past history of recurrent depression ? ?Past Medical History:  ?Past Medical History:  ?Diagnosis Date  ? Allergy   ? Anxiety   ? Arrhythmia   ? "does not see cardiologist, sees pcp, Dr. Alonza Smoker  ? Asthma   ? DX IN MARCH 2012 "allergy induced asthma"  ? Bronchitis   ? hx of  ? Depression   ? "past hx of for Panic attacks, not on medication at this time"  ? Family history of anesthesia complication   ? mother "nausea and vomiting post surger"  ? GERD (gastroesophageal reflux disease)   ? Goiter 03/17/2012  ? 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 Heartburn   ? Herpes   ? on medication for outbreaks only  ? Hypertension   ? Seizures (HCC)   ? MEDICATION INDUCED  ? Urinary tract infection   ? hx of  ?  ?Past Surgical History:  ?Procedure Laterality Date  ? CESAREAN SECTION    ? x 2  ? COLONOSCOPY    ? ELBOW SURGERY    ? right  ? KNEE ARTHROSCOPY    ? right  ? PILONIDAL CYST EXCISION    ? spinal  cystectomy    ? "where pilonidal cyst was"  ? THYROIDECTOMY  04/07/2012  ? Procedure: THYROIDECTOMY;  Surgeon: Currie Paris, MD;  [facility];  Service: General;  Laterality: N/A;   ?  ? ?Family History:  ?Family History  ?Problem Relation Age of Onset  ? Heart failure Father   ? Hypertension Father   ? Seizures Father   ? Heart disease Father   ? Cervical cancer Mother   ? Seizures Brother   ? Irritable bowel syndrome Brother   ? Cirrhosis Brother   ? Hypertension Brother   ?     x 4  ? Fibromyalgia Sister   ? Atrial fibrillation Sister   ? Prostate cancer Brother   ? Breast cancer Neg Hx   ? ?Family Psychiatric  History: See previous ?Social History:  ?Social History  ? ?Substance and Sexual Activity  ?Alcohol Use No  ? Alcohol/week: 0.0 standard drinks  ?   ?Social History  ? ?Substance and Sexual Activity  ?Drug Use No  ?  ?Social History  ? ?Socioeconomic History  ? Marital status: Divorced  ?  Spouse name: Not on file  ? Number of children: 2  ? Years of education: Not on file  ? Highest education level: Not on file  ?Occupational History  ? Occupation: Patient  Care Customer Service  ?  Employer: [employer]  ?Tobacco Use  ? Smoking status: Former  ?  Types: Cigarettes  ?  Quit date: 05/01/1999  ?  Years since quitting: 22.4  ? Smokeless tobacco: Never  ?Substance and Sexual Activity  ? Alcohol use: No  ?  Alcohol/week: 0.0 standard drinks  ? Drug use: No  ? Sexual activity: Yes  ?  Partners: Male  ?Other Topics Concern  ? Not on file  ?Social History Narrative  ? Not on file  ? ?Social Determinants of Health  ? ?Financial Resource Strain: Not on file  ?Food Insecurity: Not on file  ?Transportation Needs: Not on file  ?Physical Activity: Not on file  ?Stress: Not on file  ?Social Connections: Not on file  ? ?Additional Social History:  ?  ?  ?  ?  ?  ?  ?  ?  ?  ?  ?  ? ?Sleep: Fair ? ?Appetite:  Fair ? ?Current Medications: ?Current Facility-Administered Medications  ?Medication Dose Route Frequency  Provider Last Rate Last Admin  ? alum & mag hydroxide-simeth (MAALOX/MYLANTA) 200-200-20 MG/5ML suspension 30 mL  30 mL Oral Q4H PRN Charm RingsLord, Jamison Y, NP   30 mL at 10/19/21 2122  ? carbamazepine (TEGRETOL) chewable tablet 50 mg  50 mg Oral BH-q8a4p Sarina IllHerrick, Richard Edward, DO   50 mg at 10/24/21 [id number]  ? cholecalciferol (VITAMIN D3) tablet 5,000 Units  5,000 Units Oral Daily Charm RingsLord, Jamison Y, NP   5,000 Units at 10/24/21 [id number]  ? clonazePAM (KLONOPIN) tablet 0.5 mg  0.5 mg Oral TID Danelle EarthlyMalik, Kashif   0.5 mg at 10/24/21 [id number]  ? hydrOXYzine (ATARAX) tablet 25 mg  25 mg Oral QHS PRN Charm RingsLord, Jamison Y, NP   25 mg at 10/12/21 2158  ? hydrOXYzine (ATARAX) tablet 50 mg  50 mg Oral QHS Sarina IllHerrick, Richard Edward, DO   50 mg at 10/23/21 2104  ? levothyroxine (SYNTHROID) tablet 100 mcg  100 mcg Oral Q0600 Charm RingsLord, Jamison Y, NP   100 mcg at 10/24/21 [id number]  ? magnesium hydroxide (MILK OF MAGNESIA) suspension 30 mL  30 mL Oral Daily PRN Charm RingsLord, Jamison Y, NP   30 mL at 10/16/21 2147  ? multivitamin with minerals tablet 1 tablet  1 tablet Oral Daily Charm RingsLord, Jamison Y, NP   1 tablet at 10/24/21 [id number]  ? ondansetron (ZOFRAN) tablet 4 mg  4 mg Oral Q6H PRN Charm RingsLord, Jamison Y, NP      ? Or  ? ondansetron (ZOFRAN) injection 4 mg  4 mg Intravenous Q6H PRN Charm RingsLord, Jamison Y, NP      ? risperiDONE (RISPERDAL) tablet 1 mg  1 mg Oral BH-q8a4p Sarina IllHerrick, Richard Edward, DO   1 mg at 10/24/21 [id number]  ? venlafaxine XR (EFFEXOR-XR) 24 hr capsule 225 mg  225 mg Oral QPC breakfast Sarina IllHerrick, Richard Edward, DO   225 mg at 10/24/21 [id number]  ? ? ?Lab Results:  ?Results for orders placed or performed during the hospital encounter of 10/10/21 (from the past 48 hour(s))  ?Basic metabolic panel     Status: Abnormal  ? Collection Time: 10/24/21  6:35 AM  ?Result Value Ref Range  ? Sodium 128 (L) 135 - 145 mmol/L  ? Potassium 4.9 3.5 - 5.1 mmol/L  ? Chloride 96 (L) 98 - 111 mmol/L  ? CO2 25 22 - 32 mmol/L  ? Glucose, Bld 84 70 - 99 mg/dL  ?  Comment: Glucose reference range applies  only to samples taken  after fasting for at least 8 hours.  ? BUN 13 8 - 23 mg/dL  ? Creatinine, Ser 0.90 0.44 - 1.00 mg/dL  ? Calcium 8.6 (L) 8.9 - 10.3 mg/dL  ? GFR, Estimated >60 >60 mL/min  ?  Comment: (NOTE) ?Calculated using the CKD-EPI Creatinine Equation (2021) ?  ? Anion gap 7 5 - 15  ?  Comment: Performed at [facility], [address], [place], [place] [address]  ?CBC with Differential/Platelet     Status: Abnormal  ? Collection Time: 10/24/21  6:35 AM  ?Result Value Ref Range  ? WBC 3.9 (L) 4.0 - 10.5 K/uL  ? RBC 3.51 (L) 3.87 - 5.11 MIL/uL  ? Hemoglobin 11.4 (L) 12.0 - 15.0 g/dL  ? HCT 32.2 (L) 36.0 - 46.0 %  ? MCV 91.7 80.0 - 100.0 fL  ? MCH 32.5 26.0 - 34.0 pg  ? MCHC 35.4 30.0 - 36.0 g/dL  ? RDW 11.7 11.5 - 15.5 %  ? Platelets 212 150 - 400 K/uL  ? nRBC 0.0 0.0 - 0.2 %  ? Neutrophils Relative % 54 %  ? Neutro Abs 2.2 1.7 - 7.7 K/uL  ? Lymphocytes Relative 26 %  ? Lymphs Abs 1.0 0.7 - 4.0 K/uL  ? Monocytes Relative 12 %  ? Monocytes Absolute 0.5 0.1 - 1.0 K/uL  ? Eosinophils Relative 5 %  ? Eosinophils Absolute 0.2 0.0 - 0.5 K/uL  ? Basophils Relative 2 %  ? Basophils Absolute 0.1 0.0 - 0.1 K/uL  ? Immature Granulocytes 1 %  ? Abs Immature Granulocytes 0.02 0.00 - 0.07 K/uL  ?  Comment: Performed at [facility], [address], [place], [place] [address]  ? ? ?Blood Alcohol level:  ?Lab Results  ?Component Value Date  ? ETH <10 10/08/2021  ? ETH <10 10/06/2021  ? ? ?Metabolic Disorder Labs: ?Lab Results  ?Component Value Date  ? HGBA1C 5.1 10/17/2021  ? MPG 99.67 10/17/2021  ? MPG 91.06 09/09/2021  ? ?No results found for: PROLACTIN ?Lab Results  ?Component Value Date  ? CHOL 160 10/17/2021  ? TRIG 107 10/17/2021  ? HDL 53 10/17/2021  ? CHOLHDL 3.0 10/17/2021  ? VLDL 21 10/17/2021  ? LDLCALC 86 10/17/2021  ? LDLCALC 67 09/09/2021  ? ? ?Physical Findings: ?AIMS:  , ,  ,  ,    ?CIWA:    ?COWS:    ? ?Musculoskeletal: ?Strength & Muscle Tone: within normal limits ?Gait & Station:  normal ?Patient leans: N/A ? ?Psychiatric Specialty Exam: ? ?Presentation  ?General Appearance: Appropriate for Environment; Casual; Fairly Groomed; Neat; Well Groomed ? ?Eye Contact:Fair ? ?Speech:Slow ? ?Speech Volume

## 2021-10-24 NOTE — Group Note (Signed)
[facility] LCSW Group Therapy Note ? ? ?Group Date: 10/24/2021 ?Start Time: 1300 ?End Time: 1400 ? ? ?Type of Therapy and Topic: Group Therapy: Avoiding Self-Sabotaging and Enabling Behaviors ? ?Participation Level: Did Not Attend ? ?Mood: X ? ?Description of Group:  ?In this group, patients will learn how to identify obstacles, self-sabotaging and enabling behaviors, as well as: what are they, why do we do them and what needs these behaviors meet. Discuss unhealthy relationships and how to have positive healthy boundaries with those that sabotage and enable. Explore aspects of self-sabotage and enabling in yourself and how to limit these self-destructive behaviors in everyday life. ? ? ?Therapeutic Goals: ?1. Patient will identify one obstacle that relates to self-sabotage and enabling behaviors ?2. Patient will identify one personal self-sabotaging or enabling behavior they did prior to admission ?3. Patient will state a plan to change the above identified behavior ?4. Patient will demonstrate ability to communicate their needs through discussion and/or role play.  ? ? ?Summary of Patient Progress: Due to limited staffing, group was not held on the unit.  ? ? ? ? ?Therapeutic Modalities:  ?Cognitive Behavioral Therapy ?Person-Centered Therapy ?Motivational Interviewing ? ? ? ?Arik Husmann K Argil Mahl, LCSWA ?

## 2021-10-24 NOTE — Progress Notes (Signed)
Pt quiet, easy to approach and no complaints. She attended group this morning, eats in dayroom and interacts with staff. She continues to deny SI/HI and AVH. Anxiety and depression score the same 3-4/10. Visible in the dayroom minimal interaction with peers. ?

## 2021-10-24 NOTE — Progress Notes (Signed)
Pt visible on the unit, interacting with staff and peers. She denies SI/HI and AVH. Anxiety score 3 and depression score 4. Pt stating she is being discharged next Tuesday.Pt reports  she lives alone, but her daughter lives 20 minutes away. Pt is one of 9 children and they live in [place]. 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 Pt reports feeling lonely and isolated at home. RN encouraged community involvement. ?

## 2021-10-24 NOTE — Plan of Care (Signed)
  Problem: Education: Goal: Knowledge of [facility] Education information/materials will improve Outcome: Progressing Goal: Emotional status will improve Outcome: Progressing Goal: Mental status will improve Outcome: Progressing Goal: Verbalization of understanding the information provided will improve Outcome: Progressing   Problem: Activity: Goal: Interest or engagement in activities will improve Outcome: Progressing Goal: Sleeping patterns will improve Outcome: Progressing   Problem: Coping: Goal: Ability to verbalize frustrations and anger appropriately will improve Outcome: Progressing Goal: Ability to demonstrate self-control will improve Outcome: Progressing   Problem: Health Behavior/Discharge Planning: Goal: Identification of resources available to assist in meeting health care needs will improve Outcome: Progressing Goal: Compliance with treatment plan for underlying cause of condition will improve Outcome: Progressing   Problem: Physical Regulation: Goal: Ability to maintain clinical measurements within normal limits will improve Outcome: Progressing   Problem: Safety: Goal: Periods of time without injury will increase Outcome: Progressing   

## 2021-10-24 NOTE — Progress Notes (Signed)
Patient presents A&O x 4. Patient states, "My day was okay."  Affect is depressed, calm and pleasant.  Denies AVH, SI, or HI and rates anxiety and depression 2/10.  Patient spent most of evening in day room watching a movie. Patient compliant with all scheduled meds.  Ongoing Q15 minute safety check rounds per unit protocol. ? ?

## 2021-10-25 NOTE — Progress Notes (Signed)
Chaplain Maggie met with pt who requested visit. Pt was seeking inspiration. Conversation exploring pt's faith story revealed interest in becoming more aware of the presence of her higher power. Pt relies on belief to inform her faith. She seeks a deeper connection to the experience of her faith. Chaplain wondered with pt about ways to connect with self, God and others that would be meaningful. Chaplain offered a book of psalms to contribute words to express faith. Chaplain suggested pt might find company in the psalms. Pt appreciated visit and noted feeling inspired from by the time together. Chaplain affirmed mutual experience of being inspired by the time spent with pt. ?

## 2021-10-25 NOTE — Progress Notes (Signed)
[facility] MD Progress Note ? ?10/25/2021 1:09 PM ?Colleen [name]  ?MRN:  [id number] ?Subjective: Patient seen and chart reviewed.  71 year old woman with history of depression.  Also recent hyponatremia.  Patient says that her mood is about the same as yesterday.  A little bit down but not actively suicidal.  Not reporting any psychotic symptoms.  Still feels somewhat tired during the day.  Patient is up out of bed most of the day dressed appropriately interacting with others.  Urine sodium came back and put together with the other labs that we have suggests a likely medication cause for her hyponatremia ?Principal Problem: Major depressive disorder, recurrent severe without psychotic features (HCC) ?Diagnosis: Principal Problem: ?  Major depressive disorder, recurrent severe without psychotic features (HCC) ?Active Problems: ?  Hyponatremia ? ?Total Time spent with patient: 30 minutes ? ?Past Psychiatric History: Past history of severe recurrent depression ? ?Past Medical History:  ?Past Medical History:  ?Diagnosis Date  ? Allergy   ? Anxiety   ? Arrhythmia   ? "does not see cardiologist, sees pcp, Dr. Alonza Smoker  ? Asthma   ? DX IN MARCH 2012 "allergy induced asthma"  ? Bronchitis   ? hx of  ? Depression   ? "past hx of for Panic attacks, not on medication at this time"  ? Family history of anesthesia complication   ? mother "nausea and vomiting post surger"  ? GERD (gastroesophageal reflux disease)   ? Goiter 03/17/2012  ? 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 Heartburn   ? Herpes   ? on medication for outbreaks only  ? Hypertension   ? Seizures (HCC)   ? MEDICATION INDUCED  ? Urinary tract infection   ? hx of  ?  ?Past Surgical History:  ?Procedure Laterality Date  ? CESAREAN SECTION    ? x 2  ? COLONOSCOPY    ? ELBOW SURGERY    ? right  ? KNEE ARTHROSCOPY    ? right  ? PILONIDAL CYST EXCISION    ? spinal cystectomy    ? "where pilonidal  cyst was"  ? THYROIDECTOMY  04/07/2012  ? Procedure: THYROIDECTOMY;  Surgeon: Currie Paris, MD;  Location: [facility] OR;  Service: General;  Laterality: N/A;   ?  ? ?Family History:  ?Family History  ?Problem Relation Age of Onset  ? Heart failure Father   ? Hypertension Father   ? Seizures Father   ? Heart disease Father   ? Cervical cancer Mother   ? Seizures Brother   ? Irritable bowel syndrome Brother   ? Cirrhosis Brother   ? Hypertension Brother   ?     x 4  ? Fibromyalgia Sister   ? Atrial fibrillation Sister   ? Prostate cancer Brother   ? Breast cancer Neg Hx   ? ?Family Psychiatric  History: See previous ?Social History:  ?Social History  ? ?Substance and Sexual Activity  ?Alcohol Use No  ? Alcohol/week: 0.0 standard drinks  ?   ?Social History  ? ?Substance and Sexual Activity  ?Drug Use No  ?  ?Social History  ? ?Socioeconomic History  ? Marital status: Divorced  ?  Spouse name: Not on file  ? Number of children: 2  ? Years of education: Not on file  ? Highest education level: Not on file  ?Occupational History  ? Occupation: Patient Care Customer Service  ?  Employer: [employer]  ?Tobacco  Use  ? Smoking status: Former  ?  Types: Cigarettes  ?  Quit date: 05/01/1999  ?  Years since quitting: 22.5  ? Smokeless tobacco: Never  ?Substance and Sexual Activity  ? Alcohol use: No  ?  Alcohol/week: 0.0 standard drinks  ? Drug use: No  ? Sexual activity: Yes  ?  Partners: Male  ?Other Topics Concern  ? Not on file  ?Social History Narrative  ? Not on file  ? ?Social Determinants of Health  ? ?Financial Resource Strain: Not on file  ?Food Insecurity: Not on file  ?Transportation Needs: Not on file  ?Physical Activity: Not on file  ?Stress: Not on file  ?Social Connections: Not on file  ? ?Additional Social History:  ?  ?  ?  ?  ?  ?  ?  ?  ?  ?  ?  ? ?Sleep: Fair ? ?Appetite:  Fair ? ?Current Medications: ?Current Facility-Administered Medications  ?Medication Dose Route Frequency Provider Last Rate Last Admin  ? alum  & mag hydroxide-simeth (MAALOX/MYLANTA) 200-200-20 MG/5ML suspension 30 mL  30 mL Oral Q4H PRN Charm RingsLord, Jamison Y, NP   30 mL at 10/19/21 2122  ? cholecalciferol (VITAMIN D3) tablet 5,000 Units  5,000 Units Oral Daily Charm RingsLord, Jamison Y, NP   5,000 Units at 10/25/21 [id number]  ? clonazePAM (KLONOPIN) tablet 0.5 mg  0.5 mg Oral TID Danelle EarthlyMalik, Kashif   0.5 mg at 10/25/21 [id number]  ? hydrOXYzine (ATARAX) tablet 25 mg  25 mg Oral QHS PRN Charm RingsLord, Jamison Y, NP   25 mg at 10/12/21 2158  ? hydrOXYzine (ATARAX) tablet 50 mg  50 mg Oral QHS Sarina IllHerrick, Richard Edward, DO   50 mg at 10/24/21 2105  ? levothyroxine (SYNTHROID) tablet 100 mcg  100 mcg Oral Q0600 Charm RingsLord, Jamison Y, NP   100 mcg at 10/25/21 0602  ? magnesium hydroxide (MILK OF MAGNESIA) suspension 30 mL  30 mL Oral Daily PRN Charm RingsLord, Jamison Y, NP   30 mL at 10/16/21 2147  ? multivitamin with minerals tablet 1 tablet  1 tablet Oral Daily Charm RingsLord, Jamison Y, NP   1 tablet at 10/25/21 [id number]  ? ondansetron (ZOFRAN) tablet 4 mg  4 mg Oral Q6H PRN Charm RingsLord, Jamison Y, NP      ? Or  ? ondansetron (ZOFRAN) injection 4 mg  4 mg Intravenous Q6H PRN Charm RingsLord, Jamison Y, NP      ? risperiDONE (RISPERDAL) tablet 1 mg  1 mg Oral BH-q8a4p Sarina IllHerrick, Richard Edward, DO   1 mg at 10/25/21 [id number]  ? venlafaxine XR (EFFEXOR-XR) 24 hr capsule 225 mg  225 mg Oral QPC breakfast Sarina IllHerrick, Richard Edward, DO   225 mg at 10/25/21 [id number]  ? ? ?Lab Results:  ?Results for orders placed or performed during the hospital encounter of 10/10/21 (from the past 48 hour(s))  ?Basic metabolic panel     Status: Abnormal  ? Collection Time: 10/24/21  6:35 AM  ?Result Value Ref Range  ? Sodium 128 (L) 135 - 145 mmol/L  ? Potassium 4.9 3.5 - 5.1 mmol/L  ? Chloride 96 (L) 98 - 111 mmol/L  ? CO2 25 22 - 32 mmol/L  ? Glucose, Bld 84 70 - 99 mg/dL  ?  Comment: Glucose reference range applies only to samples taken after fasting for at least 8 hours.  ? BUN 13 8 - 23 mg/dL  ? Creatinine, Ser 0.90 0.44 - 1.00 mg/dL  ? Calcium 8.6 (L) 8.9 - 10.3 mg/dL  ?  GFR, Estimated >60 >60 mL/min  ?  Comment: (NOTE) ?Calculated using the CKD-EPI Creatinine Equation (2021) ?  ? Anion gap 7 5 - 15  ?  Comment: Performed at [facility], [address] [place], [place] [address]  ?CBC with Differential/Platelet     Status: Abnormal  ? Collection Time: 10/24/21  6:35 AM  ?Result Value Ref Range  ? WBC 3.9 (L) 4.0 - 10.5 K/uL  ? RBC 3.51 (L) 3.87 - 5.11 MIL/uL  ? Hemoglobin 11.4 (L) 12.0 - 15.0 g/dL  ? HCT 32.2 (L) 36.0 - 46.0 %  ? MCV 91.7 80.0 - 100.0 fL  ? MCH 32.5 26.0 - 34.0 pg  ? MCHC 35.4 30.0 - 36.0 g/dL  ? RDW 11.7 11.5 - 15.5 %  ? Platelets 212 150 - 400 K/uL  ? nRBC 0.0 0.0 - 0.2 %  ? Neutrophils Relative % 54 %  ? Neutro Abs 2.2 1.7 - 7.7 K/uL  ? Lymphocytes Relative 26 %  ? Lymphs Abs 1.0 0.7 - 4.0 K/uL  ? Monocytes Relative 12 %  ? Monocytes Absolute 0.5 0.1 - 1.0 K/uL  ? Eosinophils Relative 5 %  ? Eosinophils Absolute 0.2 0.0 - 0.5 K/uL  ? Basophils Relative 2 %  ? Basophils Absolute 0.1 0.0 - 0.1 K/uL  ? Immature Granulocytes 1 %  ? Abs Immature Granulocytes 0.02 0.00 - 0.07 K/uL  ?  Comment: Performed at [facility], [address] [place], [place] [address]  ?Sodium, urine, random     Status: None  ? Collection Time: 10/24/21 12:41 PM  ?Result Value Ref Range  ? Sodium, Ur 38 mmol/L  ?  Comment: Performed at [facility], [address] [place], [place] [address]  ? ? ?Blood Alcohol level:  ?Lab Results  ?Component Value Date  ? ETH <10 10/08/2021  ? ETH <10 10/06/2021  ? ? ?Metabolic Disorder Labs: ?Lab Results  ?Component Value Date  ? HGBA1C 5.1 10/17/2021  ? MPG 99.67 10/17/2021  ? MPG 91.06 09/09/2021  ? ?No results found for: PROLACTIN ?Lab Results  ?Component Value Date  ? CHOL 160 10/17/2021  ? TRIG 107 10/17/2021  ? HDL 53 10/17/2021  ? CHOLHDL 3.0 10/17/2021  ? VLDL 21 10/17/2021  ? LDLCALC 86 10/17/2021  ? LDLCALC 67 09/09/2021  ? ? ?Physical Findings: ?AIMS:  , ,  ,  ,    ?CIWA:    ?COWS:     ? ?Musculoskeletal: ?Strength & Muscle Tone: within normal limits ?Gait & Station: normal ?Patient leans: N/A ? ?Psychiatric Specialty Exam: ? ?Presentation  ?General Appearance: Appropriate for Environment; Casual; Fairly Groomed; N

## 2021-10-25 NOTE — [facility] Group Notes (Signed)
10am - Group: chair ypga , Pt distant  but present, ?

## 2021-10-25 NOTE — Progress Notes (Addendum)
Patient is seen in the dayroom interacting appropriately on the unit. She appears as sullen. She denies pain, SI, HI, AVH, anxiety. She rated her depression 2/10. She attended and participated in group. She is safe on the unit at this time. Q 15 min safety checks in place.  ?

## 2021-10-25 NOTE — Progress Notes (Signed)
Patient presents A&O x 4. Patient states, "I felt flat all day."  Affect is depressed, calm and pleasant.  Denies AVH, SI, HI or anxiety and rates depression 2/10.  Patient spent most of evening in day room watching a movie. Patient compliant with all scheduled meds.  Ongoing Q15 minute safety check rounds per unit protocol. ?

## 2021-10-25 NOTE — Group Note (Signed)
LCSW Group Therapy Note ? ? ?Group Date: 10/25/2021 ?Start Time: 1300 ?End Time: 1400 ? ? ?Type of Therapy and Topic:  Group Therapy: Boundaries ? ?Participation Level:  Active ? ?Description of Group: ?This group will address the use of boundaries in their personal lives. Patients will explore why boundaries are important, the difference between healthy and unhealthy boundaries, and negative and postive outcomes of different boundaries and will look at how boundaries can be crossed.  Patients will be encouraged to identify current boundaries in their own lives and identify what kind of boundary is being set. Facilitators will guide patients in utilizing problem-solving interventions to address and correct types boundaries being used and to address when no boundary is being used. Understanding and applying boundaries will be explored and addressed for obtaining and maintaining a balanced life. Patients will be encouraged to explore ways to assertively make their boundaries and needs known to significant others in their lives, using other group members and facilitator for role play, support, and feedback. ? ?Therapeutic Goals: ? ?1.  Patient will identify areas in their life where setting clear boundaries could be  used to improve their life.  ?2.  Patient will identify signs/triggers that a boundary is not being respected. ?3.  Patient will identify two ways to set boundaries in order to achieve balance in  their lives: ?4.  Patient will demonstrate ability to communicate their needs and set boundaries  through discussion and/or role plays ? ?Summary of Patient Progress:  Patient was present for the entirety of the group session. Patient was an active listener and participated in the topic of discussion, provided helpful advice to others, and added nuance to topic of conversation. Patient shared that her daughter has conveyed that she needs to enforce boundaries which makes her feel isolated and alone. CSW discussed  appropriate ways patient can respond and care for herself.  ? ?Therapeutic Modalities:   ?Cognitive Behavioral Therapy ?Solution-Focused Therapy ? ?Durenda Hurt, LCSWA ?10/25/2021  3:47 PM   ? ?

## 2021-10-26 NOTE — Progress Notes (Signed)
Recreation Therapy Notes ? ?Date: 10/26/2021 ?  ?Time: 1:30 pm  ?  ?Location: Craft room    ?  ?Behavioral response: N/A ?  ?Intervention Topic: Time Management     ?  ?Discussion/Intervention: ?Patient refused to attend group.  ?  ?Clinical Observations/Feedback:  ?Patient refused to attend group.  ?  ?Geralynn Capri LRT/CTRS ? ? ? ? ? ? ? ?Jossie Smoot ?10/26/2021 1:37 PM ?

## 2021-10-26 NOTE — Progress Notes (Signed)
[facility] MD Progress Note ? ?10/26/2021 12:29 PM ?Rossie Muskrat Faxon  ?MRN:  [id number] ?Subjective: Colleen [name] states that she is feeling better and would like to leave tomorrow.  Dr. Toni Amend discontinued her Tegretol because of low sodium which she states she has had for a while.  The Tegretol was pretty low dose to begin with.  She states that her mood is good and she is ready to go.  She denies any depression, suicidal ideation or negative thoughts.  She is tolerating her medications without any problems. ? ?Principal Problem: Major depressive disorder, recurrent severe without psychotic features (HCC) ?Diagnosis: Principal Problem: ?  Major depressive disorder, recurrent severe without psychotic features (HCC) ?Active Problems: ?  Hyponatremia ? ?Total Time spent with patient: 15 minutes ? ?Past Psychiatric History: [facility] ?[name], MD,  patient has a past psychiatric history of MDD and anxiety.  She is prescribed Xanax 0.5 mg tablets twice daily. She presented to [facility] on 09/07/2021 with increased depression and anxiety.  She was prescribed Lexapro 5 mg daily, with an appointment to see [name] for medication management on 09/12/2021.  Recently discharged from old [place]. ?  ? ?Past Medical History:  ?Past Medical History:  ?Diagnosis Date  ? Allergy   ? Anxiety   ? Arrhythmia   ? "does not see cardiologist, sees pcp, Dr. Alonza Smoker  ? Asthma   ? DX IN MARCH 2012 "allergy induced asthma"  ? Bronchitis   ? hx of  ? Depression   ? "past hx of for Panic attacks, not on medication at this time"  ? Family history of anesthesia complication   ? mother "nausea and vomiting post surger"  ? GERD (gastroesophageal reflux disease)   ? Goiter 03/17/2012  ? 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 Heartburn   ? Herpes   ? on medication for outbreaks only  ? Hypertension   ? Seizures (HCC)   ? MEDICATION INDUCED  ? Urinary  tract infection   ? hx of  ?  ?Past Surgical History:  ?Procedure Laterality Date  ? CESAREAN SECTION    ? x 2  ? COLONOSCOPY    ? ELBOW SURGERY    ? right  ? KNEE ARTHROSCOPY    ? right  ? PILONIDAL CYST EXCISION    ? spinal cystectomy    ? "where pilonidal cyst was"  ? THYROIDECTOMY  04/07/2012  ? Procedure: THYROIDECTOMY;  Surgeon: Currie Paris, MD;  Location: [facility] OR;  Service: General;  Laterality: N/A;   ?  ? ?Family History:  ?Family History  ?Problem Relation Age of Onset  ? Heart failure Father   ? Hypertension Father   ? Seizures Father   ? Heart disease Father   ? Cervical cancer Mother   ? Seizures Brother   ? Irritable bowel syndrome Brother   ? Cirrhosis Brother   ? Hypertension Brother   ?     x 4  ? Fibromyalgia Sister   ? Atrial fibrillation Sister   ? Prostate cancer Brother   ? Breast cancer Neg Hx   ? ? ?Social History:  ?Social History  ? ?Substance and Sexual Activity  ?Alcohol Use No  ? Alcohol/week: 0.0 standard drinks  ?   ?Social History  ? ?Substance and Sexual Activity  ?Drug Use No  ?  ?Social History  ? ?Socioeconomic History  ? Marital status: Divorced  ?  Spouse name: Not on file  ? Number of children: 2  ? Years of education: Not on file  ? Highest education level: Not on file  ?Occupational History  ? Occupation: Patient Care Customer Service  ?  Employer: [employer]  ?Tobacco Use  ? Smoking status: Former  ?  Types: Cigarettes  ?  Quit date: 05/01/1999  ?  Years since quitting: 22.5  ? Smokeless tobacco: Never  ?Substance and Sexual Activity  ? Alcohol use: No  ?  Alcohol/week: 0.0 standard drinks  ? Drug use: No  ? Sexual activity: Yes  ?  Partners: Male  ?Other Topics Concern  ? Not on file  ?Social History Narrative  ? Not on file  ? ?Social Determinants of Health  ? ?Financial Resource Strain: Not on file  ?Food Insecurity: Not on file  ?Transportation Needs: Not on file  ?Physical Activity: Not on file  ?Stress: Not on file  ?Social Connections: Not on file  ? ?Additional  Social History:  ?  ?  ?  ?  ?  ?  ?  ?  ?  ?  ?  ? ?Sleep: Good ? ?Appetite:  Good ? ?Current Medications: ?Current Facility-Administered Medications  ?Medication Dose Route Frequency Provider Last Rate Last Admin  ? alum & mag hydroxide-simeth (MAALOX/MYLANTA) 200-200-20 MG/5ML suspension 30 mL  30 mL Oral Q4H PRN Charm RingsLord, Jamison Y, NP   30 mL at 10/19/21 2122  ? cholecalciferol (VITAMIN D3) tablet 5,000 Units  5,000 Units Oral Daily Charm RingsLord, Jamison Y, NP   5,000 Units at 10/26/21 0931  ? clonazePAM (KLONOPIN) tablet 0.5 mg  0.5 mg Oral TID Danelle EarthlyMalik, Kashif   0.5 mg at 10/26/21 [id number]  ? hydrOXYzine (ATARAX) tablet 25 mg  25 mg Oral QHS PRN Charm RingsLord, Jamison Y, NP   25 mg at 10/12/21 2158  ? hydrOXYzine (ATARAX) tablet 50 mg  50 mg Oral QHS Sarina IllHerrick, Hinda Lindor Edward, DO   50 mg at 10/25/21 2108  ? levothyroxine (SYNTHROID) tablet 100 mcg  100 mcg Oral Q0600 Charm RingsLord, Jamison Y, NP   100 mcg at 10/26/21 [id number]  ? magnesium hydroxide (MILK OF MAGNESIA) suspension 30 mL  30 mL Oral Daily PRN Charm RingsLord, Jamison Y, NP   30 mL at 10/25/21 2111  ? multivitamin with minerals tablet 1 tablet  1 tablet Oral Daily Charm RingsLord, Jamison Y, NP   1 tablet at 10/26/21 [id number]  ? ondansetron (ZOFRAN) tablet 4 mg  4 mg Oral Q6H PRN Charm RingsLord, Jamison Y, NP      ? Or  ? ondansetron (ZOFRAN) injection 4 mg  4 mg Intravenous Q6H PRN Charm RingsLord, Jamison Y, NP      ? risperiDONE (RISPERDAL) tablet 1 mg  1 mg Oral [id number] Sarina IllHerrick, Titiana Severa Edward, DO   1 mg at 10/26/21 0900  ? venlafaxine XR (EFFEXOR-XR) 24 hr capsule 225 mg  225 mg Oral QPC breakfast Sarina IllHerrick, Zayana Salvador Edward, DO   225 mg at 10/26/21 [id number]  ? ? ?Lab Results:  ?Results for orders placed or performed during the hospital encounter of 10/10/21 (from the past 48 hour(s))  ?Sodium, urine, random     Status: None  ? Collection Time: 10/24/21 12:41 PM  ?Result Value Ref Range  ? Sodium, Ur 38 mmol/L  ?  Comment: Performed at [facility], [address] [place], [place] [id number]  ? ? ?Blood Alcohol level:  ?Lab  Results  ?Component Value Date  ? ETH <10 10/08/2021  ? ETH <10 10/06/2021  ? ? ?  Metabolic Disorder Labs: ?Lab Results  ?Component Value Date  ? HGBA1C 5.1 10/17/2021  ? MPG 99.67 10/17/2021  ? MPG 91.06 09/09/2021  ? ?No results found for: PROLACTIN ?Lab Results  ?Component Value Date  ? CHOL 160 10/17/2021  ? TRIG 107 10/17/2021  ? HDL 53 10/17/2021  ? CHOLHDL 3.0 10/17/2021  ? VLDL 21 10/17/2021  ? LDLCALC 86 10/17/2021  ? LDLCALC 67 09/09/2021  ? ? ?Physical Findings: ?AIMS:  , ,  ,  ,    ?CIWA:    ?COWS:    ? ?Musculoskeletal: ?Strength & Muscle Tone: within normal limits ?Gait & Station: normal ?Patient leans: N/A ? ?Psychiatric Specialty Exam: ? ?Presentation  ?General Appearance: Appropriate for Environment; Casual; Fairly Groomed; Neat; Well Groomed ? ?Eye Contact:Fair ? ?Speech:Slow ? ?Speech Volume:Decreased ? ?Handedness:Right ? ? ?Mood and Affect  ?Mood:Anxious; Depressed; Irritable ? ?Affect:Restricted ? ? ?Thought Process  ?Thought Processes:Coherent ? ?Descriptions of Associations:Intact ? ?Orientation:Full (Time, Place and Person) ? ?Thought Content:Logical ? ?History of Schizophrenia/Schizoaffective disorder:No ? ?Duration of Psychotic Symptoms:No data recorded ?Hallucinations:No data recorded ?Ideas of Reference:None ? ?Suicidal Thoughts:No data recorded ?Homicidal Thoughts:No data recorded ? ?Sensorium  ?Memory:Immediate Fair; Remote Fair ? ?Judgment:Fair ? ?Insight:Fair ? ? ?Executive Functions  ?Concentration:Fair ? ?Attention Span:Fair ? ?Recall:Fair ? ?Fund of Knowledge:Fair ? ?Language:Fair ? ? ?Psychomotor Activity  ?Psychomotor Activity:No data recorded ? ?Assets  ?Assets:Communication Skills; Desire for Improvement; Financial Resources/Insurance; Housing; Social Support ? ? ?Sleep  ?Sleep:No data recorded ? ? ?Physical Exam: ?Physical Exam ?Vitals and nursing note reviewed.  ?Constitutional:   ?   Appearance: Normal appearance. She is normal weight.  ?Neurological:  ?   General: No focal  deficit present.  ?   Mental Status: She is alert and oriented to person, place, and time.  ?Psychiatric:     ?   Attention and Perception: Attention and perception normal.     ?   Mood and Affect: Mood and aff

## 2021-10-26 NOTE — BHH Group Notes (Signed)
14:00 group yoga , very active ?

## 2021-10-26 NOTE — Plan of Care (Signed)
°  Problem: Group Participation °Goal: STG - Patient will engage in groups without prompting or encouragement from LRT x3 group sessions within 5 recreation therapy group sessions °Description: STG - Patient will engage in groups without prompting or encouragement from LRT x3 group sessions within 5 recreation therapy group sessions °Outcome: Progressing °  °

## 2021-10-27 MED ORDER — VENLAFAXINE HCL ER 75 MG PO CP24: 225.0000 mg | ORAL_CAPSULE | Freq: Every day | ORAL | 3 refills | Status: DC

## 2021-10-27 MED ORDER — CLONAZEPAM 0.5 MG PO TABS
0.5000 mg | ORAL_TABLET | Freq: Three times a day (TID) | ORAL | 3 refills | Status: DC
Start: 2021-10-27 — End: 2024-03-09

## 2021-10-27 MED ORDER — HYDROXYZINE HCL 50 MG PO TABS
50.0000 mg | ORAL_TABLET | Freq: Every day | ORAL | 3 refills | Status: DC
Start: 2021-10-27 — End: 2024-03-09

## 2021-10-27 MED ORDER — RISPERIDONE 1 MG PO TABS: 1.0000 mg | ORAL_TABLET | ORAL | 3 refills | Status: DC

## 2021-10-27 NOTE — Progress Notes (Signed)
Recreation Therapy Notes ?  ?Date: 10/27/2021 ?  ?Time: 1:35pm   ?  ?Location: Courtyard    ?  ?Behavioral response: Appropriate ?  ?Intervention Topic: Leisure   ?  ?Discussion/Intervention:  ?Group content today was focused on leisure. The group defined what leisure is and some positive leisure activities they participate in. Individuals identified the difference between good and bad leisure. Participants expressed how they feel after participating in the leisure of their choice. The group discussed how they go about picking a leisure activity and if others are involved in their leisure activities. The patient stated how many leisure activities they have to choose from and reasons why it is important to have leisure time. Individuals participated in the intervention ?Exploration of Leisure? where they had a chance to identify new leisure activities as well as benefits of leisure. ?Clinical Observations/Feedback: ?Patient came to group and was able to explore participate in many leisure activities. Participant was able to identify leisure activities they enjoy outside of the hospital. Individual was social with peers and staff while participating in the intervention.    ?Saharah Sherrow LRT/CTRS  ?  ? ? ? ? ? ? ? ?Colleen [name] ?10/27/2021 2:36 PM ?

## 2021-10-27 NOTE — Plan of Care (Signed)
  Problem: Education: Goal: Knowledge of [facility] Education information/materials will improve Outcome: Progressing Goal: Emotional status will improve Outcome: Progressing Goal: Mental status will improve Outcome: Progressing Goal: Verbalization of understanding the information provided will improve Outcome: Progressing   Problem: Activity: Goal: Interest or engagement in activities will improve Outcome: Progressing Goal: Sleeping patterns will improve Outcome: Progressing   Problem: Coping: Goal: Ability to verbalize frustrations and anger appropriately will improve Outcome: Progressing Goal: Ability to demonstrate self-control will improve Outcome: Progressing   Problem: Health Behavior/Discharge Planning: Goal: Identification of resources available to assist in meeting health care needs will improve Outcome: Progressing Goal: Compliance with treatment plan for underlying cause of condition will improve Outcome: Progressing   Problem: Physical Regulation: Goal: Ability to maintain clinical measurements within normal limits will improve Outcome: Progressing   Problem: Safety: Goal: Periods of time without injury will increase Outcome: Progressing   

## 2021-10-27 NOTE — Progress Notes (Signed)
Patient presents A&O x 4. Patient states, "I had a good day."  Affect is sullen, calm and pleasant.  Denies AVH, SI, HI or depression.  Patient endorses feeling "a little anxious about leaving tomorrow."  Patient spent most of evening in day room watching TV. Patient compliant with all scheduled meds.  Ongoing Q15 minute safety check rounds per unit protocol. ?

## 2021-10-27 NOTE — Care Management Important Message (Signed)
Important Message ? ?Patient Details  ?Name: Colleen [name] ?MRN: [id number] ?Date of Birth: February 18, 1951 ? ? ?Medicare Important Message Given:  Yes ? ? ? ? ?Gennavieve Huq A [place], LCSWA ?10/27/2021, 9:31 AM ?

## 2021-10-27 NOTE — Progress Notes (Signed)
Recreation Therapy Notes ? ?INPATIENT RECREATION TR PLAN ? ?Patient Details ?Name: Colleen [name] ?MRN: [id number] ?DOB: [birth year] ?Today's Date: 10/27/2021 ? ?Rec Therapy Plan ?Is patient appropriate for Therapeutic Recreation?: Yes ?Treatment times per week: at least 3 ?Estimated Length of Stay: 5-7 days ?TR Treatment/Interventions: Group participation (Comment) ? ?Discharge Criteria ?Pt will be discharged from therapy if:: Discharged ?Treatment plan/goals/alternatives discussed and agreed upon by:: Patient/family ? ?Discharge Summary ?Short term goals set: Patient will engage in groups without prompting or encouragement from LRT x3 group sessions within 5 recreation therapy group sessions ?Short term goals met: Complete ?Progress toward goals comments: Groups attended ?Which groups?: Leisure education, Communication, Social skills, AAA/T, Other (Comment) (Relaxation, Problem Solving, Emotions) ?Reason goals not met: N/A ?Therapeutic equipment acquired: N/A ?Reason patient discharged from therapy: Discharge from hospital ?Pt/family agrees with progress & goals achieved: Yes ?Date patient discharged from therapy: 10/27/21 ? ? ?Ark Agrusa ?10/27/2021, 3:21 PM ?

## 2021-10-27 NOTE — Progress Notes (Signed)
?  [facility] Adult Case Management Discharge Plan : ? ?Will you be returning to the same living situation after discharge:  No.Patient will be living with family member temporarily upon discharge ?At discharge, do you have transportation home?: Yes,  transportation will be provided by pt's daughter ?Do you have the ability to pay for your medications: Yes,  pt has Medicare Part A & B ? ?Release of information consent forms completed and in the chart;  Patient's signature needed at discharge. ? ?Patient to Follow up at: ? Follow-up Information   ? ? [facility] Follow up on 11/12/2021.   ?Why: You have an appointment scheduled for follow up and medication management with Merian Capron on Thursday May 4th at 1:20pm. (in person). Please arrive 15 minutes early to complete paperwork. Thanks! ?Contact information: ?Address: [address], [place], [place] [address], [place] ?Phone:  [phone]  ?FAX: [phone] ? ?  ?  ? ? Alger Memos, LCSW Follow up on 11/11/2021.   ?Why: You have an appointment scheduled for Wednesday May 3rd at 2pm in person, with your therapist Alger Memos. Thanks! Please contact to reschedule if needed. ?Contact information: ?[address] ?[place], [place] [address] ?Phone: [phone] ?Fax: [phone] ? ?  ?  ? ?  ?  ? ?  ? ? ?Next level of care provider has access to [facility] Link:no ? ?Safety Planning and Suicide Prevention discussed: Yes,  SPE completed with pt's friend ? ?  ? ?Has patient been referred to the Quitline?: Patient refused referral ? ?Patient has been referred for addiction treatment: N/A ? ?Alcario Tinkey A [place], LCSWA ?10/27/2021, 1:39 PM ?

## 2021-10-27 NOTE — BHH Suicide Risk Assessment (Signed)
[facility] Discharge Suicide Risk Assessment ? ? ?Principal Problem: Major depressive disorder, recurrent severe without psychotic features ([place]) ?Discharge Diagnoses: Principal Problem: ?  Major depressive disorder, recurrent severe without psychotic features ([place]) ?Active Problems: ?  Hyponatremia ? ? ?Total Time spent with patient: 1 hour ? ?Musculoskeletal: ?Strength & Muscle Tone: within normal limits ?Gait & Station: normal ?Patient leans: N/A ? ?Psychiatric Specialty Exam ? ?Presentation  ?General Appearance: Appropriate for Environment; Casual; Fairly Groomed; Neat; Well Groomed ? ?Eye Contact:Fair ? ?Speech:Slow ? ?Speech Volume:Decreased ? ?Handedness:Right ? ? ?Mood and Affect  ?Mood:Anxious; Depressed; Irritable ? ?Duration of Depression Symptoms: Greater than two weeks ? ?Affect:Restricted ? ? ?Thought Process  ?Thought Processes:Coherent ? ?Descriptions of Associations:Intact ? ?Orientation:Full (Time, Place and Person) ? ?Thought Content:Logical ? ?History of Schizophrenia/Schizoaffective disorder:No ? ?Duration of Psychotic Symptoms:No data recorded ?Hallucinations:No data recorded ?Ideas of Reference:None ? ?Suicidal Thoughts:No data recorded ?Homicidal Thoughts:No data recorded ? ?Sensorium  ?Memory:Immediate Fair; Remote Fair ? ?Judgment:Fair ? ?Insight:Fair ? ? ?Executive Functions  ?Concentration:Fair ? ?Attention Span:Fair ? ?Recall:Fair ? ?[place] ? ?Language:Fair ? ? ?Psychomotor Activity  ?Psychomotor Activity:No data recorded ? ?Assets  ?Assets:Communication Skills; Desire for Improvement; Financial Resources/Insurance; Housing; Social Support ? ? ?Sleep  ?Sleep:No data recorded ? ? ?Blood pressure 109/82, pulse 75, temperature 97.9 ?F (36.6 ?C), temperature source Oral, resp. rate 20, height 5\' 4"  (1.626 m), weight 65.3 kg, SpO2 99 %. Body mass index is 24.71 kg/m?. ? ?Mental Status Per Nursing Assessment::   ?On Admission:  Suicidal ideation indicated by patient ? ?Demographic  Factors:  ?Age 70 or older, Caucasian, and Living alone ? ?Loss Factors: ?NA ? ?Historical Factors: ?NA ? ?Risk Reduction Factors:   ?Sense of responsibility to family, Positive social support, Positive therapeutic relationship, and Positive coping skills or problem solving skills ? ?Continued Clinical Symptoms:  ?Depression:   Anhedonia ? ?Cognitive Features That Contribute To Risk:  ?None   ? ?Suicide Risk:  ?Minimal: No identifiable suicidal ideation.  Patients presenting with no risk factors but with morbid ruminations; may be classified as minimal risk based on the severity of the depressive symptoms ? ? Follow-up Information   ? ? [facility] Follow up on 11/12/2021.   ?Why: You have an appointment scheduled for follow up and medication management with Loletta Specter on Thursday May 4th at 1:20pm. (in person). Please arrive 15 minutes early to complete paperwork. Thanks! ?Contact information: ?Address: [address], [place], [place] [address], [place] ?Phone:  [phone]  ?[id number] ? ?  ?  ? ? Andres Shad, LCSW Follow up on 11/11/2021.   ?Why: You have an appointment scheduled for Wednesday May 3rd at 2pm in person, with your therapist Andres Shad. Thanks! Please contact to reschedule if needed. ?Contact information: ?[address] ?[place], [place] [address] ?Phone: [phone] ?Fax: [phone] ? ?  ?  ? ?  ?  ? ?  ? ? ?Plan Of Care/Follow-up recommendations: [facility] ? ? ?Parks Ranger, DO ?10/27/2021, 11:14 AM ?

## 2021-10-27 NOTE — Plan of Care (Signed)
?  Problem: Education: ?Goal: Knowledge of [facility] Education information/materials will improve ?10/27/2021 1613 by Ardelle Anton, RN ?Outcome: Adequate for Discharge ?10/27/2021 1351 by Ardelle Anton, RN ?Outcome: Progressing ?Goal: Emotional status will improve ?10/27/2021 1613 by Ardelle Anton, RN ?Outcome: Adequate for Discharge ?10/27/2021 1351 by Ardelle Anton, RN ?Outcome: Progressing ?Goal: Mental status will improve ?10/27/2021 1613 by Ardelle Anton, RN ?Outcome: Adequate for Discharge ?10/27/2021 1351 by Ardelle Anton, RN ?Outcome: Progressing ?Goal: Verbalization of understanding the information provided will improve ?10/27/2021 1613 by Ardelle Anton, RN ?Outcome: Adequate for Discharge ?10/27/2021 1351 by Ardelle Anton, RN ?Outcome: Progressing ?  ?Problem: Activity: ?Goal: Interest or engagement in activities will improve ?10/27/2021 1613 by Ardelle Anton, RN ?Outcome: Adequate for Discharge ?10/27/2021 1351 by Ardelle Anton, RN ?Outcome: Progressing ?Goal: Sleeping patterns will improve ?10/27/2021 1613 by Ardelle Anton, RN ?Outcome: Adequate for Discharge ?10/27/2021 1351 by Ardelle Anton, RN ?Outcome: Progressing ?  ?Problem: Coping: ?Goal: Ability to verbalize frustrations and anger appropriately will improve ?10/27/2021 1613 by Ardelle Anton, RN ?Outcome: Adequate for Discharge ?10/27/2021 1351 by Ardelle Anton, RN ?Outcome: Progressing ?Goal: Ability to demonstrate self-control will improve ?10/27/2021 1613 by Ardelle Anton, RN ?Outcome: Adequate for Discharge ?10/27/2021 1351 by Ardelle Anton, RN ?Outcome: Progressing ?  ?Problem: Health Behavior/Discharge Planning: ?Goal: Identification of resources available to assist in meeting health care needs will improve ?10/27/2021 1613 by Ardelle Anton, RN ?Outcome: Adequate for  Discharge ?10/27/2021 1351 by Ardelle Anton, RN ?Outcome: Progressing ?Goal: Compliance with treatment plan for underlying cause of condition will improve ?10/27/2021 1613 by Ardelle Anton, RN ?Outcome: Adequate for Discharge ?10/27/2021 1351 by Ardelle Anton, RN ?Outcome: Progressing ?  ?Problem: Physical Regulation: ?Goal: Ability to maintain clinical measurements within normal limits will improve ?10/27/2021 1613 by Ardelle Anton, RN ?Outcome: Adequate for Discharge ?10/27/2021 1351 by Ardelle Anton, RN ?Outcome: Progressing ?  ?Problem: Safety: ?Goal: Periods of time without injury will increase ?10/27/2021 1613 by Ardelle Anton, RN ?Outcome: Adequate for Discharge ?10/27/2021 1351 by Ardelle Anton, RN ?Outcome: Progressing ?  ?

## 2021-10-27 NOTE — [facility] Group Notes (Signed)
[facility] Group Notes:  (Nursing/MHT/Case Management/Adjunct) ? ?Date:  10/27/2021  ?Time:  1:37 PM ? ?Type of Therapy:  Psychoeducational Skills ? ?Participation Level:  Active ? ?Participation Quality:  Appropriate ? ?Affect:  Appropriate ? ?Cognitive:  Appropriate ? ?Insight:  Appropriate ? ?Engagement in Group:  Engaged ? ?Modes of Intervention:  Discussion ? ?Summary of Progress/Problems: ?The pt attended group and shared during discussion voluntarily. ?Barbaraann Rondo ?10/27/2021, 1:37 PM ?

## 2021-10-27 NOTE — BH IP Treatment Plan (Signed)
Interdisciplinary Treatment and Diagnostic Plan Update ? ?10/27/2021 ?Time of Session: 9:30AM ?Colleen [name] [name] ?MRN: [id number] ? ?Principal Diagnosis: Major depressive disorder, recurrent severe without psychotic features (HCC) ? ?Secondary Diagnoses: Principal Problem: ?  Major depressive disorder, recurrent severe without psychotic features (HCC) ?Active Problems: ?  Hyponatremia ? ? ?Current Medications:  ?Current Facility-Administered Medications  ?Medication Dose Route Frequency Provider Last Rate Last Admin  ? alum & mag hydroxide-simeth (MAALOX/MYLANTA) 200-200-20 MG/5ML suspension 30 mL  30 mL Oral Q4H PRN Charm RingsLord, Jamison Y, NP   30 mL at 10/19/21 2122  ? cholecalciferol (VITAMIN D3) tablet 5,000 Units  5,000 Units Oral Daily Charm RingsLord, Jamison Y, NP   5,000 Units at 10/26/21 0931  ? clonazePAM (KLONOPIN) tablet 0.5 mg  0.5 mg Oral TID Danelle EarthlyMalik, Kashif   0.5 mg at 10/26/21 2111  ? hydrOXYzine (ATARAX) tablet 25 mg  25 mg Oral QHS PRN Charm RingsLord, Jamison Y, NP   25 mg at 10/12/21 2158  ? hydrOXYzine (ATARAX) tablet 50 mg  50 mg Oral QHS Sarina IllHerrick, Richard Edward, DO   50 mg at 10/26/21 2111  ? levothyroxine (SYNTHROID) tablet 100 mcg  100 mcg Oral Q0600 Charm RingsLord, Jamison Y, NP   100 mcg at 10/27/21 [id number]  ? magnesium hydroxide (MILK OF MAGNESIA) suspension 30 mL  30 mL Oral Daily PRN Charm RingsLord, Jamison Y, NP   30 mL at 10/25/21 2111  ? multivitamin with minerals tablet 1 tablet  1 tablet Oral Daily Charm RingsLord, Jamison Y, NP   1 tablet at 10/26/21 [id number]  ? ondansetron (ZOFRAN) tablet 4 mg  4 mg Oral Q6H PRN Charm RingsLord, Jamison Y, NP      ? Or  ? ondansetron (ZOFRAN) injection 4 mg  4 mg Intravenous Q6H PRN Charm RingsLord, Jamison Y, NP      ? risperiDONE (RISPERDAL) tablet 1 mg  1 mg Oral [id number] Sarina IllHerrick, Richard Edward, DO   1 mg at 10/27/21 0847  ? venlafaxine XR (EFFEXOR-XR) 24 hr capsule 225 mg  225 mg Oral QPC breakfast Sarina IllHerrick, Richard Edward, DO   225 mg at 10/27/21 0847  ? ?PTA Medications: ?Medications Prior to Admission  ?Medication Sig Dispense  Refill Last Dose  ? carbamazepine (TEGRETOL) 100 MG chewable tablet Chew 0.5 tablets (50 mg total) by mouth daily with supper. 60 tablet 0   ? escitalopram (LEXAPRO) 10 MG tablet Take 0.5 tablets (5 mg total) by mouth daily. 7 tablet 0   ? levothyroxine (SYNTHROID) 100 MCG tablet Take 1 tablet (100 mcg total) by mouth daily. 90 tablet 1   ? losartan (COZAAR) 50 MG tablet Take 1 tablet (50 mg total) by mouth daily.     ? Multiple Vitamin (MULTIVITAMIN) tablet Take 1 tablet by mouth daily.     ? ? ?Patient Stressors: Health problems   ? ?Patient Strengths: Ability for insight  ?Supportive family/friends  ? ?Treatment Modalities: Medication Management, Group therapy, Case management,  ?1 to 1 session with clinician, Psychoeducation, Recreational therapy. ? ? ?Physician Treatment Plan for Primary Diagnosis: Major depressive disorder, recurrent severe without psychotic features (HCC) ?Long Term Goal(s): Improvement in symptoms so as ready for discharge  ? ?Short Term Goals: Ability to maintain clinical measurements within normal limits will improve ?Compliance with prescribed medications will improve ?Ability to identify triggers associated with substance abuse/mental health issues will improve ?Ability to verbalize feelings will improve ?Ability to disclose and discuss suicidal ideas ?Ability to identify and develop effective coping behaviors will improve ? ?Medication Management: Evaluate patient's response, side  effects, and tolerance of medication regimen. ? ?Therapeutic Interventions: 1 to 1 sessions, Unit Group sessions and Medication administration. ? ?Evaluation of Outcomes: Adequate for Discharge ? ?Physician Treatment Plan for Secondary Diagnosis: Principal Problem: ?  Major depressive disorder, recurrent severe without psychotic features (HCC) ?Active Problems: ?  Hyponatremia ? ?Long Term Goal(s): Improvement in symptoms so as ready for discharge  ? ?Short Term Goals: Ability to maintain clinical measurements  within normal limits will improve ?Compliance with prescribed medications will improve ?Ability to identify triggers associated with substance abuse/mental health issues will improve ?Ability to verbalize feelings will improve ?Ability to disclose and discuss suicidal ideas ?Ability to identify and develop effective coping behaviors will improve    ? ?Medication Management: Evaluate patient's response, side effects, and tolerance of medication regimen. ? ?Therapeutic Interventions: 1 to 1 sessions, Unit Group sessions and Medication administration. ? ?Evaluation of Outcomes: Adequate for Discharge ? ? ?RN Treatment Plan for Primary Diagnosis: Major depressive disorder, recurrent severe without psychotic features (HCC) ?Long Term Goal(s): Knowledge of disease and therapeutic regimen to maintain health will improve ? ?Short Term Goals: Ability to remain free from injury will improve, Ability to verbalize frustration and anger appropriately will improve, Ability to demonstrate self-control, Ability to participate in decision making will improve, Ability to verbalize feelings will improve, Ability to disclose and discuss suicidal ideas, Ability to identify and develop effective coping behaviors will improve, and Compliance with prescribed medications will improve ? ?Medication Management: RN will administer medications as ordered by provider, will assess and evaluate patient's response and provide education to patient for prescribed medication. RN will report any adverse and/or side effects to prescribing provider. ? ?Therapeutic Interventions: 1 on 1 counseling sessions, Psychoeducation, Medication administration, Evaluate responses to treatment, Monitor vital signs and CBGs as ordered, Perform/monitor CIWA, COWS, AIMS and Fall Risk screenings as ordered, Perform wound care treatments as ordered. ? ?Evaluation of Outcomes: Adequate for Discharge ? ? ?LCSW Treatment Plan for Primary Diagnosis: Major depressive disorder,  recurrent severe without psychotic features (HCC) ?Long Term Goal(s): Safe transition to appropriate next level of care at discharge, Engage patient in therapeutic group addressing interpersonal concerns. ? ?Short Term Goals: Engage patient in aftercare planning with referrals and resources, Increase social support, Increase ability to appropriately verbalize feelings, Increase emotional regulation, Facilitate acceptance of mental health diagnosis and concerns, Identify triggers associated with mental health/substance abuse issues, and Increase skills for wellness and recovery ? ?Therapeutic Interventions: Assess for all discharge needs, 1 to 1 time with Child psychotherapist, Explore available resources and support systems, Assess for adequacy in community support network, Educate family and significant other(s) on suicide prevention, Complete Psychosocial Assessment, Interpersonal group therapy. ? ?Evaluation of Outcomes: Adequate for Discharge ? ? ?Progress in Treatment: ?Attending groups: Yes. ?Participating in groups: Yes. ?Taking medication as prescribed: Yes. ?Toleration medication: Yes. ?Family/Significant other contact made: Yes, individual(s) contacted:  SPE completed with Johnnye Lana, pt's friend ?Patient understands diagnosis: Yes. ?Discussing patient identified problems/goals with staff: Yes. ?Medical problems stabilized or resolved: Yes. ?Denies suicidal/homicidal ideation: Yes. ?Issues/concerns per patient self-inventory: No. ?Other: None ? ?New problem(s) identified: No, Describe:  none ? ?New Short Term/Long Term Goal(s): Patient to work towards elimination of symptoms of psychosis, medication management for mood stabilization; elimination of SI thoughts; development of comprehensive mental wellness plan. Update 10/17/2021: No changes at this time. Update 10/22/21: No changes at this time. Update 10/27/21: No changes at this time.  ?  ?  ?Patient Goals:  Patient states "I  want to get to the level where I am  not going to fear going home." Update 10/17/2021: No changes at this time. Update 10/22/21: No changes at this time. Update 10/27/21: No changes at this time.  ?  ?Discharge Plan or Barriers: No psycho

## 2021-10-27 NOTE — Progress Notes (Signed)
Discharge education done with pt and she was able to repeat this information. All belongings returned to pt. Pt ambulatory to  car with MHT. Pt going home with her daughter. ? ? ?

## 2021-10-27 NOTE — Plan of Care (Signed)
?  Problem: Group Participation ?Goal: STG - Patient will engage in groups without prompting or encouragement from LRT x3 group sessions within 5 recreation therapy group sessions ?Description: STG - Patient will engage in groups without prompting or encouragement from LRT x3 group sessions within 5 recreation therapy group sessions ?Outcome: Completed/Met ?  ?

## 2021-10-27 NOTE — Progress Notes (Signed)
Pt pleasant on approach and no complaints. She denies SI/HI and AVH. Pt for discharge today to go home with her daughter and then back home after a few days. Pt glad to be go home and to get  back to her routine. She plans to start sewing to occupy her mind and time. No complaints of anxiety or depression. ?

## 2021-10-27 NOTE — Discharge Summary (Signed)
Physician Discharge Summary Note ? ?Patient:  Colleen [name] is an 71 y.o., female ?MRN:  [id number] ?DOB:  1950-11-19 ?Patient phone:  [phone] (home)  ?Patient address:   ?[address] ?[place] [address],  ?Total Time spent with patient: 1 hour ? ?Date of Admission:  10/10/2021 ?Date of Discharge: 10/27/2021 ? ?Reason for Admission:  Patient was initially admitted voluntarily to [facility] from [facility] with diagnosis of  Major depressive disorder. Patient verbalizes suicidal thoughts with no plan in place and verbally contracts for safety on the unit. Patient presents to unit ambulatory, alert and oriented x4.  Patient is able to state why she is here "I need help with these suicidal thoughts." Patient's affect is extremely anxious and  patient endorses depression and anxiety 10/10. Patient denies HI, AVH and pain. Patient states that her health issues are her stressors. Patient reports fair appetite. Denies incontinence and reports last BM 10/07/2021  Patient denies smoking, drug abuse and ETOH use. Patient reports living alone, with her daughters and friends as support system. Patient is divorced. When asked what her strengths are or the goals she would like to work on while here pt states, "my strengths are my friendships, my courage and my children.  ?Later that day, Patient c/o of tachycardia and muscle spasms.  "I just don't feel right after taking that new medicine".  Patient given Seroquel first dose an hour before complaint.  Writer did assess patient b/p 78/56 HR 109.  Patient c/o anxiety.  Upon pulling Ativan to manage symptoms nursing staff informed writer pt had loss her balance and became unsteady falling to floor.  Nurse tech did grab patient and assist patient to floor. Patient observed having seizure like activity for around 45 seconds with muscle spasms and rt arm contracture during episode. She was transferred to medical floor for observation and was eventually sent  back last night.  ? Patient evaluated in her room today; she reports feeling depressed, anxious, has intrusive thoughts and suicidal ideations without a plan. She is denying any HI or any AVH. She had diffiuculty sleeping last night and has fair appetite. She reports along H/O depression and anxiety and her Lexapro dose was recently increased. She is agreeable to switching Xanax to Klonopin.  ? ?Principal Problem: Major depressive disorder, recurrent severe without psychotic features (HCC) ?Discharge Diagnoses: Principal Problem: ?  Major depressive disorder, recurrent severe without psychotic features (HCC) ?Active Problems: ?  Hyponatremia ? ? ?Past Psychiatric History: [facility] ?Colleen Husk, MD,  patient has a past psychiatric history of MDD and anxiety.  She is prescribed Xanax 0.5 mg tablets twice daily. She presented to [facility] on 09/07/2021 with increased depression and anxiety.  She was prescribed Lexapro 5 mg daily, with an appointment to see [name] for medication management on 09/12/2021.  Recently discharged from old [place]. ?  ? ?Past Medical History:  ?Past Medical History:  ?Diagnosis Date  ? Allergy   ? Anxiety   ? Arrhythmia   ? "does not see cardiologist, sees pcp, Dr. Alonza Smoker  ? Asthma   ? DX IN MARCH 2012 "allergy induced asthma"  ? Bronchitis   ? hx of  ? Depression   ? "past hx of for Panic attacks, not on medication at this time"  ? Family history of anesthesia complication   ? mother "nausea and vomiting post surger"  ? GERD (gastroesophageal reflux disease)   ? Goiter 03/17/2012  ? 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 Heartburn   ? Herpes   ? on medication for outbreaks only  ? Hypertension   ? Seizures (HCC)   ? MEDICATION INDUCED  ? Urinary tract infection   ? hx of  ?  ?Past Surgical History:  ?Procedure Laterality Date  ? CESAREAN SECTION    ? x 2  ? COLONOSCOPY    ? ELBOW SURGERY     ? right  ? KNEE ARTHROSCOPY    ? right  ? PILONIDAL CYST EXCISION    ? spinal cystectomy    ? "where pilonidal cyst was"  ? THYROIDECTOMY  04/07/2012  ? Procedure: THYROIDECTOMY;  Surgeon: Currie Parishristian J Streck, MD;  [facility];  Service: General;  Laterality: N/A;   ?  ? ?Family History:  ?Family History  ?Problem Relation Age of Onset  ? Heart failure Father   ? Hypertension Father   ? Seizures Father   ? Heart disease Father   ? Cervical cancer Mother   ? Seizures Brother   ? Irritable bowel syndrome Brother   ? Cirrhosis Brother   ? Hypertension Brother   ?     x 4  ? Fibromyalgia Sister   ? Atrial fibrillation Sister   ? Prostate cancer Brother   ? Breast cancer Neg Hx   ? ?Family Psychiatric  History: Unremarkable ?Social History:  ?Social History  ? ?Substance and Sexual Activity  ?Alcohol Use No  ? Alcohol/week: 0.0 standard drinks  ?   ?Social History  ? ?Substance and Sexual Activity  ?Drug Use No  ?  ?Social History  ? ?Socioeconomic History  ? Marital status: Divorced  ?  Spouse name: Not on file  ? Number of children: 2  ? Years of education: Not on file  ? Highest education level: Not on file  ?Occupational History  ? Occupation: Patient Care Customer Service  ?  Employer: [employer]  ?Tobacco Use  ? Smoking status: Former  ?  Types: Cigarettes  ?  Quit date: 05/01/1999  ?  Years since quitting: 22.5  ? Smokeless tobacco: Never  ?Substance and Sexual Activity  ? Alcohol use: No  ?  Alcohol/week: 0.0 standard drinks  ? Drug use: No  ? Sexual activity: Yes  ?  Partners: Male  ?Other Topics Concern  ? Not on file  ?Social History Narrative  ? Not on file  ? ?Social Determinants of Health  ? ?Financial Resource Strain: Not on file  ?Food Insecurity: Not on file  ?Transportation Needs: Not on file  ?Physical Activity: Not on file  ?Stress: Not on file  ?Social Connections: Not on file  ? ? ?Hospital Course: Colleen [name] was admitted to geriatric psychiatry under routine orders and precautions.  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  Initially we increased her Tegretol to twice a day but her sodium kept falling so it was discontinued.  She was initiated on Effexor XR which was titrated up to 225 mg/day and started on Risperdal which was titrated up to 1 mg twice a day for intrusive negative thoughts and suicidal thoughts.  After a week she started feeling better and the bad thoughts went away as we increased the Risperdal and the Effexor XR.  She was taking Klonopin and hydroxyzine for sleep and she states that she slept well throughout her hospitalization.  She was pleasant and cooperative on the unit and participated in  groups.  She felt well enough to ask to be discharged.  She maximized hospitalization she was discharged home.  On the day of discharge she denied suicidal ideation, homicidal ideation, auditory or visual hallucinations.  Her judgment and insight were good.  No side effects from her medications. ? ?Physical Findings: ?AIMS:  , ,  ,  ,    ?CIWA:    ?COWS:    ? ?Musculoskeletal: ?Strength & Muscle Tone: within normal limits ?Gait & Station: normal ?Patient leans: N/A ? ? ?Psychiatric Specialty Exam: ? ?Presentation  ?General Appearance: Appropriate for Environment; Casual; Fairly Groomed; Neat; Well Groomed ? ?Eye Contact:Fair ? ?Speech:Slow ? ?Speech Volume:Decreased ? ?Handedness:Right ? ? ?Mood and Affect  ?Mood:Anxious; Depressed; Irritable ? ?Affect:Restricted ? ? ?Thought Process  ?Thought Processes:Coherent ? ?Descriptions of Associations:Intact ? ?Orientation:Full (Time, Place and Person) ? ?Thought Content:Logical ? ?History of Schizophrenia/Schizoaffective disorder:No ? ?Duration of Psychotic Symptoms:No data recorded ?Hallucinations:No data recorded ?Ideas of Reference:None ? ?Suicidal Thoughts:No data recorded ?Homicidal Thoughts:No data recorded ? ?Sensorium  ?Memory:Immediate Fair; Remote Fair ? ?Judgment:Fair ? ?Insight:Fair ? ? ?Executive Functions   ?Concentration:Fair ? ?Attention Span:Fair ? ?Recall:Fair ? ?Fund of Knowledge:Fair ? ?Language:Fair ? ? ?Psychomotor Activity  ?Psychomotor Activity:No data recorded ? ?Assets  ?Assets:Communication Skills; Desire for Improv

## 2022-02-02 NOTE — ED Notes (Signed)
Left a VM regarding clothing left behind

## 2023-06-30 ENCOUNTER — Other Ambulatory Visit: Payer: Self-pay | Admitting: Orthopedic Surgery

## 2023-06-30 ENCOUNTER — Ambulatory Visit
Admission: RE | Admit: 2023-06-30 | Discharge: 2023-06-30 | Disposition: A | Discharge status: Home / Self Care | Payer: Medicare Other | Source: Ambulatory Visit | Attending: Orthopedic Surgery | Admitting: Orthopedic Surgery

## 2023-06-30 DIAGNOSIS — R2240 Localized swelling, mass and lump, unspecified lower limb: Secondary | ICD-10-CM | POA: Diagnosis present

## 2023-07-21 ENCOUNTER — Ambulatory Visit: Payer: Medicare Other | Admitting: Orthopaedic Surgery

## 2023-07-23 ENCOUNTER — Emergency Department
Admission: EM | Admit: 2023-07-23 | Discharge: 2023-07-23 | Discharge status: Against Medical Advice | Payer: Medicare Other | Attending: Emergency Medicine | Admitting: Emergency Medicine

## 2023-07-23 ENCOUNTER — Other Ambulatory Visit: Payer: Self-pay

## 2023-07-23 DIAGNOSIS — M79602 Pain in left arm: Secondary | ICD-10-CM | POA: Diagnosis not present

## 2023-07-23 DIAGNOSIS — Z5321 Procedure and treatment not carried out due to patient leaving prior to being seen by health care provider: Secondary | ICD-10-CM | POA: Insufficient documentation

## 2023-07-23 DIAGNOSIS — I10 Essential (primary) hypertension: Secondary | ICD-10-CM | POA: Insufficient documentation

## 2023-07-23 LAB — TROPONIN I (HIGH SENSITIVITY): Troponin I (High Sensitivity): 4 ng/L (ref <18)

## 2023-07-23 LAB — BASIC METABOLIC PANEL
Anion gap: 9 (ref 5–15)
BUN: 7 mg/dL — ABNORMAL LOW (ref 8–23)
CO2: 24 mmol/L (ref 22–32)
Calcium: 8.8 mg/dL — ABNORMAL LOW (ref 8.9–10.3)
Chloride: 97 mmol/L — ABNORMAL LOW (ref 98–111)
Creatinine, Ser: 0.94 mg/dL (ref 0.44–1.00)
GFR, Estimated: >60 mL/min (ref >60)
Glucose, Bld: 96 mg/dL (ref 70–99)
Potassium: 3.9 mmol/L (ref 3.5–5.1)
Sodium: 130 mmol/L — ABNORMAL LOW (ref 135–145)

## 2023-07-23 LAB — CBC
HCT: 34.3 % — ABNORMAL LOW (ref 36.0–46.0)
Hemoglobin: 11.8 g/dL — ABNORMAL LOW (ref 12.0–15.0)
MCH: 33.2 pg (ref 26.0–34.0)
MCHC: 34.4 g/dL (ref 30.0–36.0)
MCV: 96.6 fL (ref 80.0–100.0)
Platelets: 297 10*3/uL (ref 150–400)
RBC: 3.55 MIL/uL — ABNORMAL LOW (ref 3.87–5.11)
RDW: 11.9 % (ref 11.5–15.5)
WBC: 5.4 10*3/uL (ref 4.0–10.5)
nRBC: 0 % (ref 0.0–0.2)

## 2023-07-23 NOTE — ED Notes (Signed)
 Pt walked up to First Nurse desk and reported she wants to leave.  Pt encouraged to stay, but continued to want to leave.  Pt educated to call/follow-up w/ her PCP and informed he can always come back here if she starts feeling worse.

## 2023-07-23 NOTE — ED Provider Triage Note (Signed)
 Emergency Medicine Provider Triage Evaluation Note  Taron Conrey , a 73 y.o. female  was evaluated in triage.  Pt complains of hypertension despite taking her metoprolol . She has had a headache and left arm pain as well. Denies chest pain or shortness of breath.  Physical Exam  BP (!) 140/100   Pulse 94   Temp 98 F (36.7 C) (Oral)   Resp 20   SpO2 98%  Gen:   Awake, no distress   Resp:  Normal effort  MSK:   Moves extremities without difficulty  Other:    Medical Decision Making  Medically screening exam initiated at 3:05 PM.  Appropriate orders placed.  Rufina Kimery was informed that the remainder of the evaluation will be completed by another provider, this initial triage assessment does not replace that evaluation, and the importance of remaining in the ED until their evaluation is complete.  Labs and EKG done.   Herlinda Kirk NOVAK, FNP 07/23/23 1507

## 2023-07-23 NOTE — ED Triage Notes (Signed)
 Pt to ED via POV from home. Pt reports BP has been running high. Pt reports this am it was 180s/120s. Pt reports left arm pain and nausea. Pt takes metoprolol and has been compliant.

## 2024-03-09 ENCOUNTER — Emergency Department (HOSPITAL_BASED_OUTPATIENT_CLINIC_OR_DEPARTMENT_OTHER): Admitting: Radiology

## 2024-03-09 ENCOUNTER — Encounter (HOSPITAL_BASED_OUTPATIENT_CLINIC_OR_DEPARTMENT_OTHER): Payer: Self-pay

## 2024-03-09 ENCOUNTER — Emergency Department (HOSPITAL_BASED_OUTPATIENT_CLINIC_OR_DEPARTMENT_OTHER)

## 2024-03-09 ENCOUNTER — Other Ambulatory Visit: Payer: Self-pay

## 2024-03-09 ENCOUNTER — Observation Stay (HOSPITAL_COMMUNITY)

## 2024-03-09 ENCOUNTER — Inpatient Hospital Stay (HOSPITAL_BASED_OUTPATIENT_CLINIC_OR_DEPARTMENT_OTHER)
Admission: EM | Admit: 2024-03-09 | Discharge: 2024-03-30 | DRG: 955 | Disposition: A | Discharge status: Skilled Nursing Facility | Attending: Family Medicine | Admitting: Family Medicine

## 2024-03-09 DIAGNOSIS — K219 Gastro-esophageal reflux disease without esophagitis: Secondary | ICD-10-CM | POA: Diagnosis present

## 2024-03-09 DIAGNOSIS — Z888 Allergy status to other drugs, medicaments and biological substances status: Secondary | ICD-10-CM

## 2024-03-09 DIAGNOSIS — Z79890 Hormone replacement therapy (postmenopausal): Secondary | ICD-10-CM

## 2024-03-09 DIAGNOSIS — S14101A Unspecified injury at C1 level of cervical spinal cord, initial encounter: Secondary | ICD-10-CM | POA: Diagnosis present

## 2024-03-09 DIAGNOSIS — R1310 Dysphagia, unspecified: Secondary | ICD-10-CM | POA: Diagnosis not present

## 2024-03-09 DIAGNOSIS — I10 Essential (primary) hypertension: Secondary | ICD-10-CM | POA: Diagnosis present

## 2024-03-09 DIAGNOSIS — Z87891 Personal history of nicotine dependence: Secondary | ICD-10-CM

## 2024-03-09 DIAGNOSIS — E871 Hypo-osmolality and hyponatremia: Secondary | ICD-10-CM | POA: Diagnosis present

## 2024-03-09 DIAGNOSIS — F32A Depression, unspecified: Secondary | ICD-10-CM | POA: Diagnosis present

## 2024-03-09 DIAGNOSIS — D638 Anemia in other chronic diseases classified elsewhere: Secondary | ICD-10-CM | POA: Diagnosis present

## 2024-03-09 DIAGNOSIS — S065XAA Traumatic subdural hemorrhage with loss of consciousness status unknown, initial encounter: Principal | ICD-10-CM | POA: Diagnosis present

## 2024-03-09 DIAGNOSIS — Y848 Other medical procedures as the cause of abnormal reaction of the patient, or of later complication, without mention of misadventure at the time of the procedure: Secondary | ICD-10-CM | POA: Diagnosis not present

## 2024-03-09 DIAGNOSIS — E861 Hypovolemia: Secondary | ICD-10-CM | POA: Diagnosis present

## 2024-03-09 DIAGNOSIS — G9340 Encephalopathy, unspecified: Secondary | ICD-10-CM | POA: Clinically undetermined

## 2024-03-09 DIAGNOSIS — J690 Pneumonitis due to inhalation of food and vomit: Secondary | ICD-10-CM | POA: Diagnosis not present

## 2024-03-09 DIAGNOSIS — W450XXA Nail entering through skin, initial encounter: Secondary | ICD-10-CM

## 2024-03-09 DIAGNOSIS — S06A0XA Traumatic brain compression without herniation, initial encounter: Secondary | ICD-10-CM | POA: Diagnosis present

## 2024-03-09 DIAGNOSIS — S061XAA Traumatic cerebral edema with loss of consciousness status unknown, initial encounter: Secondary | ICD-10-CM | POA: Diagnosis present

## 2024-03-09 DIAGNOSIS — I82B11 Acute embolism and thrombosis of right subclavian vein: Secondary | ICD-10-CM | POA: Diagnosis not present

## 2024-03-09 DIAGNOSIS — W06XXXA Fall from bed, initial encounter: Secondary | ICD-10-CM | POA: Diagnosis present

## 2024-03-09 DIAGNOSIS — Z86718 Personal history of other venous thrombosis and embolism: Secondary | ICD-10-CM

## 2024-03-09 DIAGNOSIS — S0011XA Contusion of right eyelid and periocular area, initial encounter: Secondary | ICD-10-CM | POA: Diagnosis present

## 2024-03-09 DIAGNOSIS — Z91048 Other nonmedicinal substance allergy status: Secondary | ICD-10-CM

## 2024-03-09 DIAGNOSIS — F061 Catatonic disorder due to known physiological condition: Secondary | ICD-10-CM | POA: Diagnosis present

## 2024-03-09 DIAGNOSIS — T82868A Thrombosis of vascular prosthetic devices, implants and grafts, initial encounter: Secondary | ICD-10-CM | POA: Diagnosis not present

## 2024-03-09 DIAGNOSIS — S064XAA Epidural hemorrhage with loss of consciousness status unknown, initial encounter: Secondary | ICD-10-CM | POA: Diagnosis present

## 2024-03-09 DIAGNOSIS — Z79899 Other long term (current) drug therapy: Secondary | ICD-10-CM

## 2024-03-09 DIAGNOSIS — Y9301 Activity, walking, marching and hiking: Secondary | ICD-10-CM | POA: Diagnosis present

## 2024-03-09 DIAGNOSIS — E870 Hyperosmolality and hypernatremia: Secondary | ICD-10-CM | POA: Diagnosis present

## 2024-03-09 DIAGNOSIS — R4781 Slurred speech: Secondary | ICD-10-CM | POA: Diagnosis present

## 2024-03-09 DIAGNOSIS — E876 Hypokalemia: Secondary | ICD-10-CM | POA: Diagnosis present

## 2024-03-09 DIAGNOSIS — J45909 Unspecified asthma, uncomplicated: Secondary | ICD-10-CM | POA: Diagnosis present

## 2024-03-09 DIAGNOSIS — I82A11 Acute embolism and thrombosis of right axillary vein: Secondary | ICD-10-CM | POA: Diagnosis not present

## 2024-03-09 DIAGNOSIS — B009 Herpesviral infection, unspecified: Secondary | ICD-10-CM | POA: Diagnosis present

## 2024-03-09 DIAGNOSIS — F419 Anxiety disorder, unspecified: Secondary | ICD-10-CM | POA: Diagnosis present

## 2024-03-09 DIAGNOSIS — S51811A Laceration without foreign body of right forearm, initial encounter: Secondary | ICD-10-CM | POA: Diagnosis present

## 2024-03-09 DIAGNOSIS — Z7901 Long term (current) use of anticoagulants: Secondary | ICD-10-CM

## 2024-03-09 DIAGNOSIS — Z8249 Family history of ischemic heart disease and other diseases of the circulatory system: Secondary | ICD-10-CM

## 2024-03-09 LAB — BASIC METABOLIC PANEL WITH GFR
Anion gap: 11 (ref 5–15)
BUN: 9 mg/dL (ref 8–23)
CO2: 23 mmol/L (ref 22–32)
Calcium: 9.3 mg/dL (ref 8.9–10.3)
Chloride: 98 mmol/L (ref 98–111)
Creatinine, Ser: 0.87 mg/dL (ref 0.44–1.00)
GFR, Estimated: >60 mL/min (ref >60)
Glucose, Bld: 100 mg/dL — ABNORMAL HIGH (ref 70–99)
Potassium: 4.5 mmol/L (ref 3.5–5.1)
Sodium: 132 mmol/L — ABNORMAL LOW (ref 135–145)

## 2024-03-09 LAB — CBC WITH DIFFERENTIAL/PLATELET
Abs Immature Granulocytes: 0.02 K/uL (ref 0.00–0.07)
Basophils Absolute: 0 K/uL (ref 0.0–0.1)
Basophils Relative: 0 %
Eosinophils Absolute: 0 K/uL (ref 0.0–0.5)
Eosinophils Relative: 0 %
HCT: 32.2 % — ABNORMAL LOW (ref 36.0–46.0)
Hemoglobin: 11.6 g/dL — ABNORMAL LOW (ref 12.0–15.0)
Immature Granulocytes: 0 %
Lymphocytes Relative: 16 %
Lymphs Abs: 1.1 K/uL (ref 0.7–4.0)
MCH: 33 pg (ref 26.0–34.0)
MCHC: 36 g/dL (ref 30.0–36.0)
MCV: 91.5 fL (ref 80.0–100.0)
Monocytes Absolute: 0.5 K/uL (ref 0.1–1.0)
Monocytes Relative: 7 %
Neutro Abs: 5.2 K/uL (ref 1.7–7.7)
Neutrophils Relative %: 77 %
Platelets: 204 K/uL (ref 150–400)
RBC: 3.52 MIL/uL — ABNORMAL LOW (ref 3.87–5.11)
RDW: 11.7 % (ref 11.5–15.5)
WBC: 6.8 K/uL (ref 4.0–10.5)
nRBC: 0 % (ref 0.0–0.2)

## 2024-03-09 MED ORDER — HYDROMORPHONE HCL 1 MG/ML IJ SOLN: 0.5000 mg | INTRAMUSCULAR | Status: DC | PRN

## 2024-03-09 MED ORDER — POLYETHYLENE GLYCOL 3350 17 G PO PACK: 17.0000 g | PACK | Freq: Every day | ORAL | Status: DC | PRN

## 2024-03-09 MED ORDER — ONDANSETRON HCL 4 MG PO TABS
4.0000 mg | ORAL_TABLET | Freq: Once | ORAL | Status: AC
  Administered 2024-03-09: 4 mg via ORAL
  Filled 2024-03-09: qty 1

## 2024-03-09 MED ORDER — ACETAMINOPHEN 650 MG RE SUPP: 650.0000 mg | Freq: Four times a day (QID) | RECTAL | Status: DC | PRN

## 2024-03-09 MED ORDER — LAMOTRIGINE 25 MG PO TABS
150.0000 mg | ORAL_TABLET | Freq: Every day | ORAL | Status: DC
  Administered 2024-03-09 – 2024-03-10 (×2): 150 mg via ORAL
  Filled 2024-03-09 (×3): qty 1

## 2024-03-09 MED ORDER — RISPERIDONE 0.5 MG PO TABS
1.0000 mg | ORAL_TABLET | ORAL | Status: DC
  Filled 2024-03-09: qty 2

## 2024-03-09 MED ORDER — HYDROCODONE-ACETAMINOPHEN 5-325 MG PO TABS
1.0000 | ORAL_TABLET | ORAL | Status: DC | PRN
  Administered 2024-03-09 – 2024-03-10 (×2): 2 via ORAL
  Filled 2024-03-09 (×3): qty 2

## 2024-03-09 MED ORDER — RISPERIDONE 3 MG PO TABS
3.0000 mg | ORAL_TABLET | ORAL | Status: DC
  Filled 2024-03-09: qty 1

## 2024-03-09 MED ORDER — BISACODYL 10 MG RE SUPP: 10.0000 mg | Freq: Every day | RECTAL | Status: DC | PRN

## 2024-03-09 MED ORDER — TRAZODONE HCL 50 MG PO TABS
50.0000 mg | ORAL_TABLET | Freq: Every day | ORAL | Status: DC
  Administered 2024-03-09 – 2024-03-10 (×2): 50 mg via ORAL
  Filled 2024-03-09 (×2): qty 1

## 2024-03-09 MED ORDER — ONDANSETRON HCL 4 MG/2ML IJ SOLN
4.0000 mg | Freq: Four times a day (QID) | INTRAMUSCULAR | Status: DC | PRN
Start: 2024-03-09 — End: 2024-03-12
  Administered 2024-03-10 – 2024-03-11 (×3): 4 mg via INTRAVENOUS
  Filled 2024-03-09 (×4): qty 2

## 2024-03-09 MED ORDER — ONDANSETRON 4 MG PO TBDP: 4.0000 mg | ORAL_TABLET | Freq: Four times a day (QID) | ORAL | Status: DC | PRN

## 2024-03-09 MED ORDER — FLEET ENEMA RE ENEM: 1.0000 | ENEMA | Freq: Once | RECTAL | Status: DC | PRN

## 2024-03-09 MED ORDER — BUSPIRONE HCL 15 MG PO TABS
7.5000 mg | ORAL_TABLET | Freq: Two times a day (BID) | ORAL | Status: DC
  Administered 2024-03-09 – 2024-03-11 (×4): 7.5 mg via ORAL
  Filled 2024-03-09 (×4): qty 2

## 2024-03-09 MED ORDER — ADULT MULTIVITAMIN W/MINERALS CH
1.0000 | ORAL_TABLET | Freq: Every day | ORAL | Status: DC
  Administered 2024-03-10 – 2024-03-11 (×2): 1 via ORAL
  Filled 2024-03-09 (×2): qty 1

## 2024-03-09 MED ORDER — LIDOCAINE-EPINEPHRINE-TETRACAINE (LET) TOPICAL GEL
3.0000 mL | Freq: Once | TOPICAL | Status: AC
  Administered 2024-03-09: 3 mL via TOPICAL
  Filled 2024-03-09: qty 3

## 2024-03-09 MED ORDER — HYDROXYZINE HCL 25 MG PO TABS: 50.0000 mg | ORAL_TABLET | Freq: Every day | ORAL | Status: DC

## 2024-03-09 MED ORDER — RISPERIDONE 3 MG PO TABS
3.0000 mg | ORAL_TABLET | Freq: Every day | ORAL | Status: DC
  Administered 2024-03-09 – 2024-03-10 (×2): 3 mg via ORAL
  Filled 2024-03-09 (×3): qty 1

## 2024-03-09 MED ORDER — VENLAFAXINE HCL ER 75 MG PO CP24: 225.0000 mg | ORAL_CAPSULE | Freq: Every day | ORAL | Status: DC

## 2024-03-09 MED ORDER — ACETAMINOPHEN 325 MG PO TABS
650.0000 mg | ORAL_TABLET | Freq: Four times a day (QID) | ORAL | Status: DC | PRN
  Administered 2024-03-10 – 2024-03-11 (×2): 650 mg via ORAL
  Filled 2024-03-09 (×2): qty 2

## 2024-03-09 MED ORDER — DOCUSATE SODIUM 100 MG PO CAPS
100.0000 mg | ORAL_CAPSULE | Freq: Two times a day (BID) | ORAL | Status: DC
  Administered 2024-03-10 – 2024-03-11 (×3): 100 mg via ORAL
  Filled 2024-03-09 (×4): qty 1

## 2024-03-09 MED ORDER — RISPERIDONE 3 MG PO TABS: 3.0000 mg | ORAL_TABLET | ORAL | Status: DC

## 2024-03-09 MED ORDER — DIAZEPAM 5 MG PO TABS
5.0000 mg | ORAL_TABLET | Freq: Once | ORAL | Status: AC
  Administered 2024-03-09: 5 mg via ORAL
  Filled 2024-03-09: qty 1

## 2024-03-09 MED ORDER — CLONAZEPAM 0.5 MG PO TABS: 0.5000 mg | ORAL_TABLET | Freq: Three times a day (TID) | ORAL | Status: DC

## 2024-03-09 MED ORDER — LEVOTHYROXINE SODIUM 100 MCG PO TABS
100.0000 ug | ORAL_TABLET | Freq: Every day | ORAL | Status: DC
  Administered 2024-03-10 – 2024-03-11 (×2): 100 ug via ORAL
  Filled 2024-03-09 (×2): qty 1

## 2024-03-09 MED ORDER — HYDRALAZINE HCL 20 MG/ML IJ SOLN
10.0000 mg | INTRAMUSCULAR | Status: DC | PRN
  Administered 2024-03-11: 10 mg via INTRAVENOUS
  Filled 2024-03-09: qty 1

## 2024-03-09 NOTE — ED Notes (Signed)
 [facility] called for transport

## 2024-03-09 NOTE — ED Notes (Signed)
C-Collar applied per MD

## 2024-03-09 NOTE — Plan of Care (Signed)

## 2024-03-09 NOTE — ED Triage Notes (Signed)
 Pt c/o mechanical fall this AM- hit the floor w my head, R eye area. Skin tears to R arm, damage to R thumbnail. C/o terrible HA, some nausea but it's only bad when I move around.

## 2024-03-09 NOTE — ED Notes (Signed)
 Purple man Green.

## 2024-03-09 NOTE — ED Provider Notes (Signed)
 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 female.   Patient is a 73 year old female who presents with face and arm pain after a fall.  She said she was walking to the bathroom this morning and tripped and fell in the dark.  She fell forward hitting her head on the ground.  She has swelling around her right eye.  She complains of a bad headache.  She has had some nausea but no vomiting.  She is not on anticoagulants.  She denies any neck or back pain.  She does have some pain in her right thumb where her nail bent back.  She has some skin tears on her right arm but denies any underlying bony tenderness.  She denies any other injuries from the fall.  No chest or abdominal pain.  She says her tetanus shot is up-to-date.       Prior to Admission medications   Medication Sig Start Date End Date Taking? Authorizing Provider  clonazePAM  (KLONOPIN ) 0.5 MG tablet Take 1 tablet (0.5 mg total) by mouth 3 (three) times daily. 10/27/21   Cam Charlie Loving, DO  hydrOXYzine  (ATARAX ) 50 MG tablet Take 1 tablet (50 mg total) by mouth at bedtime. 10/27/21   Cam Charlie Loving, DO  levothyroxine  (SYNTHROID ) 100 MCG tablet Take 1 tablet (100 mcg total) by mouth daily. 10/10/21   Von Bellis, MD  Multiple Vitamin (MULTIVITAMIN) tablet Take 1 tablet by mouth daily. 10/10/21   Von Bellis, MD  risperiDONE  (RISPERDAL ) 1 MG tablet Take 1 tablet (1 mg total) by mouth 2 (two) times daily at 8 am and 4 pm. 10/27/21   Cam Charlie Loving, DO  venlafaxine  XR (EFFEXOR -XR) 75 MG 24 hr capsule Take 3 capsules (225 mg total) by mouth daily after breakfast. 10/28/21   Cam Charlie Loving, DO    Allergies: Amlodipine , Aripiprazole, Cyclobenzaprine hcl, Cymbalta  [duloxetine  hcl], Lamictal  [lamotrigine ], Other, Sertraline, Trazodone , and Tape    Review  of Systems  Constitutional:  Negative for fever.  HENT:  Negative for nosebleeds.   Respiratory:  Negative for shortness of breath.   Cardiovascular:  Negative for chest pain.  Gastrointestinal:  Positive for nausea. Negative for abdominal pain and vomiting.  Musculoskeletal:  Negative for arthralgias, back pain, myalgias and neck pain.  Skin:  Positive for wound.  Neurological:  Positive for headaches.    Updated Vital Signs BP 133/66 (BP Location: Right Arm)   Pulse 74   Temp 98.6 F (37 C) (Oral)   Resp 14   SpO2 100%   Physical Exam Constitutional:      Appearance: She is well-developed.  HENT:     Head: Normocephalic.     Comments: Positive swelling and ecchymosis to the right supraorbital area.  There is some ecchymosis in the infraorbital area.  Extraocular eye movements are intact.  There is tenderness around the orbit.  No erythema or obvious eye involvement.  No lacerations.  No other bony tenderness to the face. Eyes:     Extraocular Movements: Extraocular movements intact.     Pupils: Pupils are equal, round, and reactive to light.  Neck:     Comments: No pain to the cervical, thoracic or lumbosacral spine Cardiovascular:     Rate and Rhythm: Normal rate and regular rhythm.     Heart sounds: Normal heart sounds.  Pulmonary:     Effort: Pulmonary effort is normal. No respiratory distress.     Breath sounds: Normal breath sounds. No wheezing or rales.  Chest:     Chest wall: No tenderness.  Abdominal:     General: Bowel sounds are normal.     Palpations: Abdomen is soft.     Tenderness: There is no abdominal tenderness. There is no guarding or rebound.  Musculoskeletal:        General: Normal range of motion.     Comments: 3 skin tears to the right arm.  The edges are well-approximated.  There is no active bleeding.  No underlying bony tenderness.  The distal tip of the nail to the right thumb has bent backward at a 90 degree angle.  The rest of the nail appears  intact.  The nailbed appears intact.  There is minor abrasion to the area where the nail bent back and separated just a little bit.  No active bleeding.  No significant underlying bony tenderness.  No other pain on palpation or range of motion of the extremities  Lymphadenopathy:     Cervical: No cervical adenopathy.  Skin:    General: Skin is warm and dry.     Findings: No rash.  Neurological:     General: No focal deficit present.     Mental Status: She is alert and oriented to person, place, and time.     (all labs ordered are listed, but only abnormal results are displayed) Labs Reviewed  BASIC METABOLIC PANEL WITH GFR - Abnormal; Notable for the following components:      Result Value   Sodium 132 (*)    Glucose, Bld 100 (*)    All other components within normal limits  CBC WITH DIFFERENTIAL/PLATELET - Abnormal; Notable for the following components:   RBC 3.52 (*)    Hemoglobin 11.6 (*)    HCT 32.2 (*)    All other components within normal limits    EKG: None  Radiology: CT Head Wo Contrast Addendum Date: 03/09/2024 ADDENDUM REPORT: 03/09/2024 17:29 ADDENDUM: CT head impressions #1 and #2 and CT cervical spine impression #1 called by telephone at the time of interpretation on 03/09/2024 at 5:11 pm to provider Shakura Cowing , who verbally acknowledged these results. The value of cervical spine MRI to help exclude acute ligamentous injury was also discussed at this site. Electronically Signed   By: Rockey Childs D.O.   On: 03/09/2024 17:29   Result Date: 03/09/2024 CLINICAL DATA:  Provided history: Head trauma, minor. Facial trauma, blunt. Neck trauma. EXAM: CT HEAD WITHOUT CONTRAST CT MAXILLOFACIAL WITHOUT CONTRAST CT CERVICAL SPINE WITHOUT CONTRAST TECHNIQUE: Multidetector CT imaging of the head, cervical spine, and maxillofacial structures were performed using the standard protocol without intravenous contrast. Multiplanar CT image reconstructions of the cervical spine and  maxillofacial structures were also generated. RADIATION DOSE REDUCTION: This exam was performed according to the departmental dose-optimization program which includes automated exposure control, adjustment of the mA and/or kV according to patient size and/or use of iterative reconstruction technique. COMPARISON:  Brain MRI 10/01/2019. Head CT 09/12/2018. FINDINGS: CT HEAD FINDINGS Brain: No age-advanced or lobar predominant cerebral atrophy. Acute subdural hemorrhage along the right cerebral convexity, measuring up to 8 mm in thickness (for instance as seen on series 4, image 50). Resultant mass effect with 8 mm leftward midline shift. Acute subdural hemorrhage is also present along the right aspect of the falx, measuring up to 5 mm in thickness (for  instance as seen on series 4, image 52). Mild asymmetric prominence of the right lateral ventricle, unchanged, chronic and possibly developmental. No demarcated cortical infarct. No evidence of an intracranial mass. Vascular: No hyperdense vessel.  Atherosclerotic calcifications. Skull: No calvarial fracture or aggressive osseous lesion. Traumatic Brain Injury Risk Stratification Skull Fracture: No - Low/mBIG 1 Subdural Hematoma (SDH): Yes Subarachnoid Hemorrhage [facility]): No Epidural Hematoma (EDH): No - Low/mBIG 1 Cerebral contusion, intra-axial, intraparenchymal Hemorrhage (IPH): No Intraventricular Hemorrhage (IVH): No - Low/mBIG 1 Midline Shift > 1mm or Edema/effacement of sulci/vents: 8 mm leftward midline shift. ---------------------------------------------------- CT MAXILLOFACIAL FINDINGS Osseous: No acute maxillofacial fracture is identified. Orbits: Right periorbital hematoma. No acute finding within the orbits. Sinuses: No significant inflammatory paranasal sinus disease. Small left frontal sinus osteoma. Soft tissues: Right anterior scalp/forehead and periorbital hematoma. CT CERVICAL SPINE FINDINGS Alignment: Nonspecific straightening of the expected cervical  lordosis. 2 mm grade 1 anterolisthesis at C7-T1, T1-T2 and T2-T3. Skull base and vertebrae: The basion-dental and atlanto-dental intervals are maintained.No evidence of acute fracture to the cervical spine. Soft tissues and spinal canal: Hyperdensity at the dorsal aspect of the craniocervical junction, measuring 4 mm in thickness. Hyperdensity within the ventral aspect of the cervical spinal canal, measuring up to 4 mm in thickness. This is consistent with epidural hematoma. No prevertebral soft tissue swelling. Disc levels: Cervical spondylosis with multilevel disc space narrowing, disc bulges/central disc protrusions, posterior disc osteophyte complexes and uncovertebral hypertrophy. No appreciable high-grade degenerative spinal canal stenosis. Multilevel bony neural foraminal narrowing. Degenerative changes also present at the C1-C2 articulation. Upper chest: No consolidation within the imaged lung apices. No visible pneumothorax. Other: Prior thyroidectomy. Attempts are being made to reach the ordering provider at this time. IMPRESSION: CT head: 1. Acute subdural hemorrhage along the right cerebral convexity, measuring up to 8 mm in thickness. Resultant mass effect with 8 mm leftward midline shift. 2. Acute subdural hemorrhage also present along the right aspect of the falx, measuring up to 5 mm in thickness. CT maxillofacial: 1. No evidence of acute maxillofacial fracture. 2. Right anterior scalp/forehead and periorbital hematoma. CT cervical spine: 1. Epidural hematoma at the craniocervical junction and within the cervical spinal canal (measuring up to 4 mm in thickness). A cervical spine MRI is recommended to better assess the extent of this hemorrhage and to exclude spinal cord mass effect. 2. No evidence of an acute cervical spine fracture. 3. Nonspecific straightening of the expected cervical lordosis. 4. Grade 1 anterolisthesis at C7-T1, T1-T2 and T2-T3. 5. Cervical spondylosis as described.  Electronically Signed: By: Rockey Childs D.O. On: 03/09/2024 17:08   CT Cervical Spine Wo Contrast Addendum Date: 03/09/2024 ADDENDUM REPORT: 03/09/2024 17:29 ADDENDUM: CT head impressions #1 and #2 and CT cervical spine impression #1 called by telephone at the time of interpretation on 03/09/2024 at 5:11 pm to provider Maurizio Geno , who verbally acknowledged these results. The value of cervical spine MRI to help exclude acute ligamentous injury was also discussed at this site. Electronically Signed   By: Rockey Childs D.O.   On: 03/09/2024 17:29   Result Date: 03/09/2024 CLINICAL DATA:  Provided history: Head trauma, minor. Facial trauma, blunt. Neck trauma. EXAM: CT HEAD WITHOUT CONTRAST CT MAXILLOFACIAL WITHOUT CONTRAST CT CERVICAL SPINE WITHOUT CONTRAST TECHNIQUE: Multidetector CT imaging of the head, cervical spine, and maxillofacial structures were performed using the standard protocol without intravenous contrast. Multiplanar CT image reconstructions of the cervical spine and maxillofacial structures were also generated. RADIATION DOSE REDUCTION: This exam  was performed according to the departmental dose-optimization program which includes automated exposure control, adjustment of the mA and/or kV according to patient size and/or use of iterative reconstruction technique. COMPARISON:  Brain MRI 10/01/2019. Head CT 09/12/2018. FINDINGS: CT HEAD FINDINGS Brain: No age-advanced or lobar predominant cerebral atrophy. Acute subdural hemorrhage along the right cerebral convexity, measuring up to 8 mm in thickness (for instance as seen on series 4, image 50). Resultant mass effect with 8 mm leftward midline shift. Acute subdural hemorrhage is also present along the right aspect of the falx, measuring up to 5 mm in thickness (for instance as seen on series 4, image 52). Mild asymmetric prominence of the right lateral ventricle, unchanged, chronic and possibly developmental. No demarcated cortical infarct. No  evidence of an intracranial mass. Vascular: No hyperdense vessel.  Atherosclerotic calcifications. Skull: No calvarial fracture or aggressive osseous lesion. Traumatic Brain Injury Risk Stratification Skull Fracture: No - Low/mBIG 1 Subdural Hematoma (SDH): Yes Subarachnoid Hemorrhage [facility]): No Epidural Hematoma (EDH): No - Low/mBIG 1 Cerebral contusion, intra-axial, intraparenchymal Hemorrhage (IPH): No Intraventricular Hemorrhage (IVH): No - Low/mBIG 1 Midline Shift > 1mm or Edema/effacement of sulci/vents: 8 mm leftward midline shift. ---------------------------------------------------- CT MAXILLOFACIAL FINDINGS Osseous: No acute maxillofacial fracture is identified. Orbits: Right periorbital hematoma. No acute finding within the orbits. Sinuses: No significant inflammatory paranasal sinus disease. Small left frontal sinus osteoma. Soft tissues: Right anterior scalp/forehead and periorbital hematoma. CT CERVICAL SPINE FINDINGS Alignment: Nonspecific straightening of the expected cervical lordosis. 2 mm grade 1 anterolisthesis at C7-T1, T1-T2 and T2-T3. Skull base and vertebrae: The basion-dental and atlanto-dental intervals are maintained.No evidence of acute fracture to the cervical spine. Soft tissues and spinal canal: Hyperdensity at the dorsal aspect of the craniocervical junction, measuring 4 mm in thickness. Hyperdensity within the ventral aspect of the cervical spinal canal, measuring up to 4 mm in thickness. This is consistent with epidural hematoma. No prevertebral soft tissue swelling. Disc levels: Cervical spondylosis with multilevel disc space narrowing, disc bulges/central disc protrusions, posterior disc osteophyte complexes and uncovertebral hypertrophy. No appreciable high-grade degenerative spinal canal stenosis. Multilevel bony neural foraminal narrowing. Degenerative changes also present at the C1-C2 articulation. Upper chest: No consolidation within the imaged lung apices. No visible  pneumothorax. Other: Prior thyroidectomy. Attempts are being made to reach the ordering provider at this time. IMPRESSION: CT head: 1. Acute subdural hemorrhage along the right cerebral convexity, measuring up to 8 mm in thickness. Resultant mass effect with 8 mm leftward midline shift. 2. Acute subdural hemorrhage also present along the right aspect of the falx, measuring up to 5 mm in thickness. CT maxillofacial: 1. No evidence of acute maxillofacial fracture. 2. Right anterior scalp/forehead and periorbital hematoma. CT cervical spine: 1. Epidural hematoma at the craniocervical junction and within the cervical spinal canal (measuring up to 4 mm in thickness). A cervical spine MRI is recommended to better assess the extent of this hemorrhage and to exclude spinal cord mass effect. 2. No evidence of an acute cervical spine fracture. 3. Nonspecific straightening of the expected cervical lordosis. 4. Grade 1 anterolisthesis at C7-T1, T1-T2 and T2-T3. 5. Cervical spondylosis as described. Electronically Signed: By: Rockey Childs D.O. On: 03/09/2024 17:08   CT Maxillofacial WO CM Addendum Date: 03/09/2024 ADDENDUM REPORT: 03/09/2024 17:29 ADDENDUM: CT head impressions #1 and #2 and CT cervical spine impression #1 called by telephone at the time of interpretation on 03/09/2024 at 5:11 pm to provider Johnhenry Tippin , who verbally acknowledged these results. The value  of cervical spine MRI to help exclude acute ligamentous injury was also discussed at this site. Electronically Signed   By: Rockey Childs D.O.   On: 03/09/2024 17:29   Result Date: 03/09/2024 CLINICAL DATA:  Provided history: Head trauma, minor. Facial trauma, blunt. Neck trauma. EXAM: CT HEAD WITHOUT CONTRAST CT MAXILLOFACIAL WITHOUT CONTRAST CT CERVICAL SPINE WITHOUT CONTRAST TECHNIQUE: Multidetector CT imaging of the head, cervical spine, and maxillofacial structures were performed using the standard protocol without intravenous contrast. Multiplanar CT  image reconstructions of the cervical spine and maxillofacial structures were also generated. RADIATION DOSE REDUCTION: This exam was performed according to the departmental dose-optimization program which includes automated exposure control, adjustment of the mA and/or kV according to patient size and/or use of iterative reconstruction technique. COMPARISON:  Brain MRI 10/01/2019. Head CT 09/12/2018. FINDINGS: CT HEAD FINDINGS Brain: No age-advanced or lobar predominant cerebral atrophy. Acute subdural hemorrhage along the right cerebral convexity, measuring up to 8 mm in thickness (for instance as seen on series 4, image 50). Resultant mass effect with 8 mm leftward midline shift. Acute subdural hemorrhage is also present along the right aspect of the falx, measuring up to 5 mm in thickness (for instance as seen on series 4, image 52). Mild asymmetric prominence of the right lateral ventricle, unchanged, chronic and possibly developmental. No demarcated cortical infarct. No evidence of an intracranial mass. Vascular: No hyperdense vessel.  Atherosclerotic calcifications. Skull: No calvarial fracture or aggressive osseous lesion. Traumatic Brain Injury Risk Stratification Skull Fracture: No - Low/mBIG 1 Subdural Hematoma (SDH): Yes Subarachnoid Hemorrhage [facility]): No Epidural Hematoma (EDH): No - Low/mBIG 1 Cerebral contusion, intra-axial, intraparenchymal Hemorrhage (IPH): No Intraventricular Hemorrhage (IVH): No - Low/mBIG 1 Midline Shift > 1mm or Edema/effacement of sulci/vents: 8 mm leftward midline shift. ---------------------------------------------------- CT MAXILLOFACIAL FINDINGS Osseous: No acute maxillofacial fracture is identified. Orbits: Right periorbital hematoma. No acute finding within the orbits. Sinuses: No significant inflammatory paranasal sinus disease. Small left frontal sinus osteoma. Soft tissues: Right anterior scalp/forehead and periorbital hematoma. CT CERVICAL SPINE FINDINGS Alignment:  Nonspecific straightening of the expected cervical lordosis. 2 mm grade 1 anterolisthesis at C7-T1, T1-T2 and T2-T3. Skull base and vertebrae: The basion-dental and atlanto-dental intervals are maintained.No evidence of acute fracture to the cervical spine. Soft tissues and spinal canal: Hyperdensity at the dorsal aspect of the craniocervical junction, measuring 4 mm in thickness. Hyperdensity within the ventral aspect of the cervical spinal canal, measuring up to 4 mm in thickness. This is consistent with epidural hematoma. No prevertebral soft tissue swelling. Disc levels: Cervical spondylosis with multilevel disc space narrowing, disc bulges/central disc protrusions, posterior disc osteophyte complexes and uncovertebral hypertrophy. No appreciable high-grade degenerative spinal canal stenosis. Multilevel bony neural foraminal narrowing. Degenerative changes also present at the C1-C2 articulation. Upper chest: No consolidation within the imaged lung apices. No visible pneumothorax. Other: Prior thyroidectomy. Attempts are being made to reach the ordering provider at this time. IMPRESSION: CT head: 1. Acute subdural hemorrhage along the right cerebral convexity, measuring up to 8 mm in thickness. Resultant mass effect with 8 mm leftward midline shift. 2. Acute subdural hemorrhage also present along the right aspect of the falx, measuring up to 5 mm in thickness. CT maxillofacial: 1. No evidence of acute maxillofacial fracture. 2. Right anterior scalp/forehead and periorbital hematoma. CT cervical spine: 1. Epidural hematoma at the craniocervical junction and within the cervical spinal canal (measuring up to 4 mm in thickness). A cervical spine MRI is recommended to better assess the extent  of this hemorrhage and to exclude spinal cord mass effect. 2. No evidence of an acute cervical spine fracture. 3. Nonspecific straightening of the expected cervical lordosis. 4. Grade 1 anterolisthesis at C7-T1, T1-T2 and T2-T3.  5. Cervical spondylosis as described. Electronically Signed: By: Rockey Childs D.O. On: 03/09/2024 17:08   DG Finger Thumb Right Result Date: 03/09/2024 CLINICAL DATA:  Thumb injury EXAM: RIGHT THUMB 2+V COMPARISON:  None Available. FINDINGS: No fracture or malalignment. Mild degenerative change at the IP and first [facility] joint. No radiopaque foreign body in the soft tissues IMPRESSION: No acute osseous abnormality. Electronically Signed   By: Luke Bun M.D.   On: 03/09/2024 16:46     Procedures   Medications Ordered in the ED  lidocaine -EPINEPHrine -tetracaine  (LET) topical gel (3 mLs Topical Given 03/09/24 1638)  ondansetron  (ZOFRAN ) tablet 4 mg (4 mg Oral Given 03/09/24 1718)                                    Medical Decision Making Amount and/or Complexity of Data Reviewed Labs: ordered. Radiology: ordered.  Risk Decision regarding hospitalization.   This patient presents to the ED for concern of fall, headache, this involves an extensive number of treatment options, and is a complaint that carries with it a high risk of complications and morbidity.  I considered the following differential and admission for this acute, potentially life threatening condition.  The differential diagnosis includes intracranial hemorrhage, skull fracture, concussion, facial fracture, cervical spine fracture  MDM:    Patient is a 73 year old who presents after mechanical fall that happened this morning.  She mostly complains of a headache and some pain around her right eye.  CT scans were performed which shows no facial fracture.  There is an 8 mm subdural hematoma with midline shift.  She also has some blood in the cervical spine epidural space.  Radiologist is recommending MRI of the cervical spine.  Discussed with Duwaine Beck with neurosurgery.  Initially we were going to get the MRI here however the tech has already left for the day.  Will go ahead and admit for observation to [facility].  She was  maintained in a c-collar until her spine can be cleared with the MRI.  Labs reviewed and are nonconcerning.  Her skin tears were cleaned and dressed.  She had an injury to the tip of the nail but no real nailbed injury.  The piece of nail that had been back was clipped off.  X-rays do not reveal any underlying fracture.  She reports that her tetanus shot is up-to-date.  CRITICAL CARE Performed by: Andrea Ness Total critical care time: 70 minutes Critical care time was exclusive of separately billable procedures and treating other patients. Critical care was necessary to treat or prevent imminent or life-threatening deterioration. Critical care was time spent personally by me on the following activities: development of treatment plan with patient and/or surrogate as well as nursing, discussions with consultants, evaluation of patient's response to treatment, examination of patient, obtaining history from patient or surrogate, ordering and performing treatments and interventions, ordering and review of laboratory studies, ordering and review of radiographic studies, pulse oximetry and re-evaluation of patient's condition.   (Labs, imaging, consults)  Labs: I Ordered, and personally interpreted labs.  The pertinent results include: Mild anemia  Imaging Studies ordered: I ordered imaging studies including CT head, cervical spine, maxillofacial bones, right thumb x-ray  I independently visualized and interpreted imaging. I agree with the radiologist interpretation  Additional history obtained from daughter.  External records from outside source obtained and reviewed including history  Cardiac Monitoring: The patient was maintained on a cardiac monitor.  If on the cardiac monitor, I personally viewed and interpreted the cardiac monitored which showed an underlying rhythm of: Sinus rhythm  Reevaluation: After the interventions noted above, I reevaluated the patient and found that they have  :stayed the same  Social Determinants of Health:    Disposition: Admit to hospital  Co morbidities that complicate the patient evaluation  Past Medical History:  Diagnosis Date   Allergy    Anxiety    Arrhythmia    does not see cardiologist, sees pcp, Dr. 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    Heartburn    Herpes    on medication for outbreaks only   Hypertension    Seizures (HCC)    MEDICATION INDUCED   Urinary tract infection    hx of     Medicines Meds ordered this encounter  Medications   lidocaine -EPINEPHrine -tetracaine  (LET) topical gel   ondansetron  (ZOFRAN ) tablet 4 mg    I have reviewed the patients home medicines and have made adjustments as needed  Problem List / ED Course: Problem List Items Addressed This Visit   None Visit Diagnoses       SDH (subdural hematoma) (HCC)    -  Primary     Epidural hematoma (HCC)         Skin tear of right forearm without complication, initial encounter         Injury by nail, initial encounter                    Final diagnoses:  SDH (subdural hematoma) (HCC)  Epidural hematoma (HCC)  Skin tear of right forearm without complication, initial encounter  Injury by nail, initial encounter    ED Discharge Orders     None          Lenor Hollering, MD 03/09/24 1849

## 2024-03-09 NOTE — H&P (Signed)
 Providing Compassionate, Quality Care - Together   Colleen [name] is an 73 y.o. female.   Chief Complaint: Subdural Hematoma HPI: Colleen [name] is a 73 year old woman with a past medical history outlined below. She was in her usual state of health this morning when she got up to go to the bathroom. Her feet got wrapped up in the blankets from her bed and she tripped, falling face first. She reports headache, neck pain, and nausea. CT imaging performed at [facility] showed acute subdural hemorrhage along the right cerebral convexity and an epidural hematoma at the craniocervical junction and within the spinal canal. She was transferred to [facility] for an MRI of her cervical spine to better evaluate the epidural hematoma. She has been admitted for overnight observation.  Past Medical History:  Diagnosis Date   Allergy    Anxiety    Arrhythmia    does not see cardiologist, sees pcp, Dr. 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    Heartburn    Herpes    on medication for outbreaks only   Hypertension    Seizures (HCC)    MEDICATION INDUCED   Urinary tract infection    hx of    Past Surgical History:  Procedure Laterality Date   CESAREAN SECTION     x 2   COLONOSCOPY     ELBOW SURGERY     right   KNEE ARTHROSCOPY     right   PILONIDAL CYST EXCISION     spinal cystectomy     where pilonidal cyst was   THYROIDECTOMY  04/07/2012   Procedure: THYROIDECTOMY;  Surgeon: Sherlean JINNY Laughter, MD;  Location: [facility] OR;  Service: General;  Laterality: N/A;       Family History  Problem  Relation Age of Onset   Heart failure Father    Hypertension Father    Seizures Father    Heart disease Father    Cervical cancer Mother    Seizures Brother    Irritable bowel syndrome Brother    Cirrhosis Brother    Hypertension Brother        x 4   Fibromyalgia Sister    Atrial fibrillation Sister    Prostate cancer Brother    Breast cancer Neg Hx    Social History:  reports that she quit smoking about 24 years ago. Her smoking use included cigarettes. She has never used smokeless tobacco. She reports that she does not drink alcohol and does not use drugs.  Allergies:  Allergies  Allergen Reactions   Amlodipine  Other (See Comments)    Flushing, tingling in face, sore throat  (Norvasc )   Aripiprazole Other (See Comments)   Cyclobenzaprine Hcl Other (See Comments)    Seizures    Cymbalta  [Duloxetine  Hcl] Other (See Comments)    Made the depression worse   Other Other (See Comments)   Sertraline Diarrhea   Tape Rash    Blisters    Medications Prior to Admission  Medication Sig Dispense Refill   busPIRone  (BUSPAR ) 7.5  MG tablet Take 7.5 mg by mouth 2 (two) times daily.     lamoTRIgine  (LAMICTAL ) 150 MG tablet Take 150 mg by mouth daily.     levothyroxine  (SYNTHROID ) 100 MCG tablet Take 1 tablet (100 mcg total) by mouth daily. 90 tablet 1   risperiDONE  (RISPERDAL ) 1 MG tablet Take 1 tablet (1 mg total) by mouth 2 (two) times daily at 8 am and 4 pm. (Patient taking differently: Take 3 mg by mouth at bedtime.) 60 tablet 3    Results for orders placed or performed during the hospital encounter of 03/09/24 (from the past 48 hours)  Basic metabolic panel     Status: Abnormal   Collection Time: 03/09/24  5:17 PM  Result Value Ref Range   Sodium 132 (L) 135 - 145 mmol/L   Potassium 4.5 3.5 - 5.1 mmol/L   Chloride 98 98 - 111 mmol/L   CO2 23 22 - 32 mmol/L   Glucose, Bld 100 (H) 70 - 99 mg/dL    Comment: Glucose reference range applies only to samples taken after fasting  for at least 8 hours.   BUN 9 8 - 23 mg/dL   Creatinine, Ser 9.12 0.44 - 1.00 mg/dL   Calcium  9.3 8.9 - 10.3 mg/dL   GFR, Estimated >39 >39 mL/min    Comment: (NOTE) Calculated using the CKD-EPI Creatinine Equation (2021)    Anion gap 11 5 - 15    Comment: Performed at [facility], [address] [place], [place] [address]  CBC with Differential     Status: Abnormal   Collection Time: 03/09/24  5:17 PM  Result Value Ref Range   WBC 6.8 4.0 - 10.5 K/uL   RBC 3.52 (L) 3.87 - 5.11 MIL/uL   Hemoglobin 11.6 (L) 12.0 - 15.0 g/dL   HCT 67.7 (L) 63.9 - 53.9 %   MCV 91.5 80.0 - 100.0 fL   MCH 33.0 26.0 - 34.0 pg   MCHC 36.0 30.0 - 36.0 g/dL   RDW 88.2 88.4 - 84.4 %   Platelets 204 150 - 400 K/uL   nRBC 0.0 0.0 - 0.2 %   Neutrophils Relative % 77 %   Neutro Abs 5.2 1.7 - 7.7 K/uL   Lymphocytes Relative 16 %   Lymphs Abs 1.1 0.7 - 4.0 K/uL   Monocytes Relative 7 %   Monocytes Absolute 0.5 0.1 - 1.0 K/uL   Eosinophils Relative 0 %   Eosinophils Absolute 0.0 0.0 - 0.5 K/uL   Basophils Relative 0 %   Basophils Absolute 0.0 0.0 - 0.1 K/uL   Immature Granulocytes 0 %   Abs Immature Granulocytes 0.02 0.00 - 0.07 K/uL    Comment: Performed at [facility], [address] [place], [place] [address]   CT Head Wo Contrast Addendum Date: 03/09/2024 ADDENDUM REPORT: 03/09/2024 17:29 ADDENDUM: CT head impressions #1 and #2 and CT cervical spine impression #1 called by telephone at the time of interpretation on 03/09/2024 at 5:11 pm to provider MELANIE BELFI , who verbally acknowledged these results. The value of cervical spine MRI to help exclude acute ligamentous injury was also discussed at this site. Electronically Signed   By: Rockey Childs D.O.   On: 03/09/2024 17:29   Result Date: 03/09/2024 CLINICAL DATA:  Provided history: Head trauma, minor. Facial trauma, blunt. Neck trauma. EXAM: CT HEAD WITHOUT CONTRAST CT MAXILLOFACIAL WITHOUT CONTRAST CT CERVICAL  SPINE WITHOUT CONTRAST TECHNIQUE: Multidetector CT imaging of the head, cervical spine, and maxillofacial structures  were performed using the standard protocol without intravenous contrast. Multiplanar CT image reconstructions of the cervical spine and maxillofacial structures were also generated. RADIATION DOSE REDUCTION: This exam was performed according to the departmental dose-optimization program which includes automated exposure control, adjustment of the mA and/or kV according to patient size and/or use of iterative reconstruction technique. COMPARISON:  Brain MRI 10/01/2019. Head CT 09/12/2018. FINDINGS: CT HEAD FINDINGS Brain: No age-advanced or lobar predominant cerebral atrophy. Acute subdural hemorrhage along the right cerebral convexity, measuring up to 8 mm in thickness (for instance as seen on series 4, image 50). Resultant mass effect with 8 mm leftward midline shift. Acute subdural hemorrhage is also present along the right aspect of the falx, measuring up to 5 mm in thickness (for instance as seen on series 4, image 52). Mild asymmetric prominence of the right lateral ventricle, unchanged, chronic and possibly developmental. No demarcated cortical infarct. No evidence of an intracranial mass. Vascular: No hyperdense vessel.  Atherosclerotic calcifications. Skull: No calvarial fracture or aggressive osseous lesion. Traumatic Brain Injury Risk Stratification Skull Fracture: No - Low/mBIG 1 Subdural Hematoma (SDH): Yes Subarachnoid Hemorrhage [facility]): No Epidural Hematoma (EDH): No - Low/mBIG 1 Cerebral contusion, intra-axial, intraparenchymal Hemorrhage (IPH): No Intraventricular Hemorrhage (IVH): No - Low/mBIG 1 Midline Shift > 1mm or Edema/effacement of sulci/vents: 8 mm leftward midline shift. ---------------------------------------------------- CT MAXILLOFACIAL FINDINGS Osseous: No acute maxillofacial fracture is identified. Orbits: Right periorbital hematoma. No acute finding within the orbits.  Sinuses: No significant inflammatory paranasal sinus disease. Small left frontal sinus osteoma. Soft tissues: Right anterior scalp/forehead and periorbital hematoma. CT CERVICAL SPINE FINDINGS Alignment: Nonspecific straightening of the expected cervical lordosis. 2 mm grade 1 anterolisthesis at C7-T1, T1-T2 and T2-T3. Skull base and vertebrae: The basion-dental and atlanto-dental intervals are maintained.No evidence of acute fracture to the cervical spine. Soft tissues and spinal canal: Hyperdensity at the dorsal aspect of the craniocervical junction, measuring 4 mm in thickness. Hyperdensity within the ventral aspect of the cervical spinal canal, measuring up to 4 mm in thickness. This is consistent with epidural hematoma. No prevertebral soft tissue swelling. Disc levels: Cervical spondylosis with multilevel disc space narrowing, disc bulges/central disc protrusions, posterior disc osteophyte complexes and uncovertebral hypertrophy. No appreciable high-grade degenerative spinal canal stenosis. Multilevel bony neural foraminal narrowing. Degenerative changes also present at the C1-C2 articulation. Upper chest: No consolidation within the imaged lung apices. No visible pneumothorax. Other: Prior thyroidectomy. Attempts are being made to reach the ordering provider at this time. IMPRESSION: CT head: 1. Acute subdural hemorrhage along the right cerebral convexity, measuring up to 8 mm in thickness. Resultant mass effect with 8 mm leftward midline shift. 2. Acute subdural hemorrhage also present along the right aspect of the falx, measuring up to 5 mm in thickness. CT maxillofacial: 1. No evidence of acute maxillofacial fracture. 2. Right anterior scalp/forehead and periorbital hematoma. CT cervical spine: 1. Epidural hematoma at the craniocervical junction and within the cervical spinal canal (measuring up to 4 mm in thickness). A cervical spine MRI is recommended to better assess the extent of this hemorrhage and to  exclude spinal cord mass effect. 2. No evidence of an acute cervical spine fracture. 3. Nonspecific straightening of the expected cervical lordosis. 4. Grade 1 anterolisthesis at C7-T1, T1-T2 and T2-T3. 5. Cervical spondylosis as described. Electronically Signed: By: Rockey Childs D.O. On: 03/09/2024 17:08   CT Cervical Spine Wo Contrast Addendum Date: 03/09/2024 ADDENDUM REPORT: 03/09/2024 17:29 ADDENDUM: CT head impressions #1 and #2 and CT  cervical spine impression #1 called by telephone at the time of interpretation on 03/09/2024 at 5:11 pm to provider MELANIE BELFI , who verbally acknowledged these results. The value of cervical spine MRI to help exclude acute ligamentous injury was also discussed at this site. Electronically Signed   By: Rockey Childs D.O.   On: 03/09/2024 17:29   Result Date: 03/09/2024 CLINICAL DATA:  Provided history: Head trauma, minor. Facial trauma, blunt. Neck trauma. EXAM: CT HEAD WITHOUT CONTRAST CT MAXILLOFACIAL WITHOUT CONTRAST CT CERVICAL SPINE WITHOUT CONTRAST TECHNIQUE: Multidetector CT imaging of the head, cervical spine, and maxillofacial structures were performed using the standard protocol without intravenous contrast. Multiplanar CT image reconstructions of the cervical spine and maxillofacial structures were also generated. RADIATION DOSE REDUCTION: This exam was performed according to the departmental dose-optimization program which includes automated exposure control, adjustment of the mA and/or kV according to patient size and/or use of iterative reconstruction technique. COMPARISON:  Brain MRI 10/01/2019. Head CT 09/12/2018. FINDINGS: CT HEAD FINDINGS Brain: No age-advanced or lobar predominant cerebral atrophy. Acute subdural hemorrhage along the right cerebral convexity, measuring up to 8 mm in thickness (for instance as seen on series 4, image 50). Resultant mass effect with 8 mm leftward midline shift. Acute subdural hemorrhage is also present along the right  aspect of the falx, measuring up to 5 mm in thickness (for instance as seen on series 4, image 52). Mild asymmetric prominence of the right lateral ventricle, unchanged, chronic and possibly developmental. No demarcated cortical infarct. No evidence of an intracranial mass. Vascular: No hyperdense vessel.  Atherosclerotic calcifications. Skull: No calvarial fracture or aggressive osseous lesion. Traumatic Brain Injury Risk Stratification Skull Fracture: No - Low/mBIG 1 Subdural Hematoma (SDH): Yes Subarachnoid Hemorrhage [facility]): No Epidural Hematoma (EDH): No - Low/mBIG 1 Cerebral contusion, intra-axial, intraparenchymal Hemorrhage (IPH): No Intraventricular Hemorrhage (IVH): No - Low/mBIG 1 Midline Shift > 1mm or Edema/effacement of sulci/vents: 8 mm leftward midline shift. ---------------------------------------------------- CT MAXILLOFACIAL FINDINGS Osseous: No acute maxillofacial fracture is identified. Orbits: Right periorbital hematoma. No acute finding within the orbits. Sinuses: No significant inflammatory paranasal sinus disease. Small left frontal sinus osteoma. Soft tissues: Right anterior scalp/forehead and periorbital hematoma. CT CERVICAL SPINE FINDINGS Alignment: Nonspecific straightening of the expected cervical lordosis. 2 mm grade 1 anterolisthesis at C7-T1, T1-T2 and T2-T3. Skull base and vertebrae: The basion-dental and atlanto-dental intervals are maintained.No evidence of acute fracture to the cervical spine. Soft tissues and spinal canal: Hyperdensity at the dorsal aspect of the craniocervical junction, measuring 4 mm in thickness. Hyperdensity within the ventral aspect of the cervical spinal canal, measuring up to 4 mm in thickness. This is consistent with epidural hematoma. No prevertebral soft tissue swelling. Disc levels: Cervical spondylosis with multilevel disc space narrowing, disc bulges/central disc protrusions, posterior disc osteophyte complexes and uncovertebral hypertrophy. No  appreciable high-grade degenerative spinal canal stenosis. Multilevel bony neural foraminal narrowing. Degenerative changes also present at the C1-C2 articulation. Upper chest: No consolidation within the imaged lung apices. No visible pneumothorax. Other: Prior thyroidectomy. Attempts are being made to reach the ordering provider at this time. IMPRESSION: CT head: 1. Acute subdural hemorrhage along the right cerebral convexity, measuring up to 8 mm in thickness. Resultant mass effect with 8 mm leftward midline shift. 2. Acute subdural hemorrhage also present along the right aspect of the falx, measuring up to 5 mm in thickness. CT maxillofacial: 1. No evidence of acute maxillofacial fracture. 2. Right anterior scalp/forehead and periorbital hematoma. CT cervical spine: 1. Epidural  hematoma at the craniocervical junction and within the cervical spinal canal (measuring up to 4 mm in thickness). A cervical spine MRI is recommended to better assess the extent of this hemorrhage and to exclude spinal cord mass effect. 2. No evidence of an acute cervical spine fracture. 3. Nonspecific straightening of the expected cervical lordosis. 4. Grade 1 anterolisthesis at C7-T1, T1-T2 and T2-T3. 5. Cervical spondylosis as described. Electronically Signed: By: Rockey Childs D.O. On: 03/09/2024 17:08   CT Maxillofacial WO CM Addendum Date: 03/09/2024 ADDENDUM REPORT: 03/09/2024 17:29 ADDENDUM: CT head impressions #1 and #2 and CT cervical spine impression #1 called by telephone at the time of interpretation on 03/09/2024 at 5:11 pm to provider MELANIE BELFI , who verbally acknowledged these results. The value of cervical spine MRI to help exclude acute ligamentous injury was also discussed at this site. Electronically Signed   By: Rockey Childs D.O.   On: 03/09/2024 17:29   Result Date: 03/09/2024 CLINICAL DATA:  Provided history: Head trauma, minor. Facial trauma, blunt. Neck trauma. EXAM: CT HEAD WITHOUT CONTRAST CT  MAXILLOFACIAL WITHOUT CONTRAST CT CERVICAL SPINE WITHOUT CONTRAST TECHNIQUE: Multidetector CT imaging of the head, cervical spine, and maxillofacial structures were performed using the standard protocol without intravenous contrast. Multiplanar CT image reconstructions of the cervical spine and maxillofacial structures were also generated. RADIATION DOSE REDUCTION: This exam was performed according to the departmental dose-optimization program which includes automated exposure control, adjustment of the mA and/or kV according to patient size and/or use of iterative reconstruction technique. COMPARISON:  Brain MRI 10/01/2019. Head CT 09/12/2018. FINDINGS: CT HEAD FINDINGS Brain: No age-advanced or lobar predominant cerebral atrophy. Acute subdural hemorrhage along the right cerebral convexity, measuring up to 8 mm in thickness (for instance as seen on series 4, image 50). Resultant mass effect with 8 mm leftward midline shift. Acute subdural hemorrhage is also present along the right aspect of the falx, measuring up to 5 mm in thickness (for instance as seen on series 4, image 52). Mild asymmetric prominence of the right lateral ventricle, unchanged, chronic and possibly developmental. No demarcated cortical infarct. No evidence of an intracranial mass. Vascular: No hyperdense vessel.  Atherosclerotic calcifications. Skull: No calvarial fracture or aggressive osseous lesion. Traumatic Brain Injury Risk Stratification Skull Fracture: No - Low/mBIG 1 Subdural Hematoma (SDH): Yes Subarachnoid Hemorrhage [facility]): No Epidural Hematoma (EDH): No - Low/mBIG 1 Cerebral contusion, intra-axial, intraparenchymal Hemorrhage (IPH): No Intraventricular Hemorrhage (IVH): No - Low/mBIG 1 Midline Shift > 1mm or Edema/effacement of sulci/vents: 8 mm leftward midline shift. ---------------------------------------------------- CT MAXILLOFACIAL FINDINGS Osseous: No acute maxillofacial fracture is identified. Orbits: Right periorbital  hematoma. No acute finding within the orbits. Sinuses: No significant inflammatory paranasal sinus disease. Small left frontal sinus osteoma. Soft tissues: Right anterior scalp/forehead and periorbital hematoma. CT CERVICAL SPINE FINDINGS Alignment: Nonspecific straightening of the expected cervical lordosis. 2 mm grade 1 anterolisthesis at C7-T1, T1-T2 and T2-T3. Skull base and vertebrae: The basion-dental and atlanto-dental intervals are maintained.No evidence of acute fracture to the cervical spine. Soft tissues and spinal canal: Hyperdensity at the dorsal aspect of the craniocervical junction, measuring 4 mm in thickness. Hyperdensity within the ventral aspect of the cervical spinal canal, measuring up to 4 mm in thickness. This is consistent with epidural hematoma. No prevertebral soft tissue swelling. Disc levels: Cervical spondylosis with multilevel disc space narrowing, disc bulges/central disc protrusions, posterior disc osteophyte complexes and uncovertebral hypertrophy. No appreciable high-grade degenerative spinal canal stenosis. Multilevel bony neural foraminal narrowing. Degenerative changes  also present at the C1-C2 articulation. Upper chest: No consolidation within the imaged lung apices. No visible pneumothorax. Other: Prior thyroidectomy. Attempts are being made to reach the ordering provider at this time. IMPRESSION: CT head: 1. Acute subdural hemorrhage along the right cerebral convexity, measuring up to 8 mm in thickness. Resultant mass effect with 8 mm leftward midline shift. 2. Acute subdural hemorrhage also present along the right aspect of the falx, measuring up to 5 mm in thickness. CT maxillofacial: 1. No evidence of acute maxillofacial fracture. 2. Right anterior scalp/forehead and periorbital hematoma. CT cervical spine: 1. Epidural hematoma at the craniocervical junction and within the cervical spinal canal (measuring up to 4 mm in thickness). A cervical spine MRI is recommended to  better assess the extent of this hemorrhage and to exclude spinal cord mass effect. 2. No evidence of an acute cervical spine fracture. 3. Nonspecific straightening of the expected cervical lordosis. 4. Grade 1 anterolisthesis at C7-T1, T1-T2 and T2-T3. 5. Cervical spondylosis as described. Electronically Signed: By: Rockey Childs D.O. On: 03/09/2024 17:08   DG Finger Thumb Right Result Date: 03/09/2024 CLINICAL DATA:  Thumb injury EXAM: RIGHT THUMB 2+V COMPARISON:  None Available. FINDINGS: No fracture or malalignment. Mild degenerative change at the IP and first [facility] joint. No radiopaque foreign body in the soft tissues IMPRESSION: No acute osseous abnormality. Electronically Signed   By: Luke Bun M.D.   On: 03/09/2024 16:46    Review of Systems  Constitutional: Negative.   HENT: Negative.    Eyes: Negative.   Respiratory: Negative.    Cardiovascular: Negative.   Gastrointestinal:  Positive for nausea.  Endocrine: Negative.   Genitourinary: Negative.   Musculoskeletal:  Positive for neck pain.  Skin: Negative.   Neurological:  Negative for speech difficulty, weakness and numbness.  Hematological: Negative.   Psychiatric/Behavioral: Negative.      Blood pressure (!) 153/85, pulse 64, temperature 98.6 F (37 C), temperature source Oral, resp. rate 14, height 5' 4.02 (1.626 m), weight 65 kg, SpO2 97%. Physical Exam Constitutional:      Appearance: Normal appearance.     Interventions: Cervical collar in place.  HENT:     Head: Normocephalic.     Mouth/Throat:     Mouth: Mucous membranes are moist.     Pharynx: Oropharynx is clear.  Eyes:     Extraocular Movements: Extraocular movements intact.     Pupils: Pupils are equal, round, and reactive to light.     Comments: Bruising at right eye  Cardiovascular:     Rate and Rhythm: Normal rate and regular rhythm.     Pulses: Normal pulses.  Pulmonary:     Effort: Pulmonary effort is normal. No respiratory distress.  Abdominal:      General: Abdomen is flat.     Palpations: Abdomen is soft.  Musculoskeletal:        General: Normal range of motion.     Cervical back: Spinous process tenderness and muscular tenderness present.  Skin:    General: Skin is warm and dry.     Capillary Refill: Capillary refill takes less than 2 seconds.  Neurological:     General: No focal deficit present.     Mental Status: She is alert and oriented to person, place, and time.  Psychiatric:        Mood and Affect: Mood normal.        Behavior: Behavior normal.        Thought Content: Thought content normal.  Judgment: Judgment normal.      Assessment/Plan Patient with small right-sided subdural hematoma and craniocervical epidural hematoma. Will get c-spine MRI to better evaluate. Patient should be able to discharge home tomorrow if no changes to her neurologic status.  Gerard JONETTA Beck, NP 03/09/2024, 10:27 PM

## 2024-03-10 ENCOUNTER — Observation Stay (HOSPITAL_COMMUNITY)

## 2024-03-10 MED ORDER — METOPROLOL SUCCINATE ER 25 MG PO TB24
25.0000 mg | ORAL_TABLET | Freq: Every day | ORAL | Status: DC
  Administered 2024-03-10 – 2024-03-11 (×2): 25 mg via ORAL
  Filled 2024-03-10 (×2): qty 1

## 2024-03-10 MED ORDER — SODIUM CHLORIDE 0.9 % IV SOLN
12.5000 mg | Freq: Once | INTRAVENOUS | Status: AC
  Administered 2024-03-10: 12.5 mg via INTRAVENOUS
  Filled 2024-03-10: qty 0.5

## 2024-03-10 MED ORDER — VENLAFAXINE HCL ER 150 MG PO CP24
300.0000 mg | ORAL_CAPSULE | Freq: Every day | ORAL | Status: DC
  Administered 2024-03-10 – 2024-03-11 (×2): 300 mg via ORAL
  Filled 2024-03-10 (×2): qty 4

## 2024-03-10 MED ORDER — IRBESARTAN 150 MG PO TABS
150.0000 mg | ORAL_TABLET | Freq: Every day | ORAL | Status: DC
  Administered 2024-03-10 – 2024-03-11 (×2): 150 mg via ORAL
  Filled 2024-03-10 (×2): qty 1

## 2024-03-10 NOTE — Op Note (Deleted)
n

## 2024-03-10 NOTE — Progress Notes (Signed)
 Orthopedic Tech Progress Note Patient Details:  Colleen [name] [facility] [birth year] [id number]  Patient ID: Colleen [name] Officer, female   DOB: [birth year], 73 y.o.   MRN: [id number] Hard collar applied by RN from unit supply room.  Hughie Melroy L Ruthel Martine 03/10/2024, 2:23 AM

## 2024-03-10 NOTE — Progress Notes (Signed)
 Overall doing well.  Patient with no significant headache.  Minimal neck pain.  No radiating pain numbness or weakness.  Vital signs are stable.  She is afebrile.  She is awake and alert.  She is oriented and appropriate.  Motor examination 5/5 bilaterally.  No evidence of pronator drift.  MRI scan of her cervical spine demonstrates evidence of a small dorsal epidural hematoma between C1 and her occiput.  Noncompressive.  No evidence of enlargement.  Remainder cervical spine appears normal.  No evidence of ligamentous injury or other more sinister problem.  Patient is status post fall with right convexity subdural hematoma.  Needs follow-up head CT scan to rule out enlargement but overall doing well.  If head CT scan stable patient may be fed and mobilized.

## 2024-03-10 NOTE — Care Management Obs Status (Signed)
 MEDICARE OBSERVATION STATUS NOTIFICATION   Patient Details  Name: Colleen [name] MRN: [id number] Date of Birth: [birth year]   Medicare Observation Status Notification Given:  Yes    Jon Cruel 03/10/2024, 2:10 PM

## 2024-03-10 NOTE — Progress Notes (Signed)
 Pt returned from CT with N/V states the nausea worsened when she stood up. VS stable. Neuro assessment remains intact. Pt had zofran  at 1100, NP Bergman notified. See new order for phenergan  12.5 mg IV x 1.

## 2024-03-10 NOTE — Plan of Care (Signed)

## 2024-03-11 ENCOUNTER — Observation Stay (HOSPITAL_COMMUNITY): Admitting: Anesthesiology

## 2024-03-11 ENCOUNTER — Encounter (HOSPITAL_COMMUNITY): Admission: EM | Disposition: A | Discharge status: Skilled Nursing Facility | Payer: Self-pay | Source: Home / Self Care

## 2024-03-11 ENCOUNTER — Observation Stay (HOSPITAL_COMMUNITY)

## 2024-03-11 DIAGNOSIS — I10 Essential (primary) hypertension: Secondary | ICD-10-CM | POA: Diagnosis not present

## 2024-03-11 DIAGNOSIS — Z87891 Personal history of nicotine dependence: Secondary | ICD-10-CM | POA: Diagnosis not present

## 2024-03-11 DIAGNOSIS — F418 Other specified anxiety disorders: Secondary | ICD-10-CM

## 2024-03-11 DIAGNOSIS — I6201 Nontraumatic acute subdural hemorrhage: Secondary | ICD-10-CM | POA: Diagnosis not present

## 2024-03-11 SURGERY — CRANIOTOMY HEMATOMA EVACUATION SUBDURAL
Anesthesia: General | Site: Head | Laterality: Right

## 2024-03-11 MED ORDER — FENTANYL CITRATE (PF) 250 MCG/5ML IJ SOLN
INTRAMUSCULAR | Status: DC | PRN
  Administered 2024-03-11: 50 ug via INTRAVENOUS

## 2024-03-11 MED ORDER — LIDOCAINE HCL (CARDIAC) PF 100 MG/5ML IV SOSY
PREFILLED_SYRINGE | INTRAVENOUS | Status: DC | PRN
Start: 2024-03-11 — End: 2024-03-12
  Administered 2024-03-11: 60 mg via INTRAVENOUS

## 2024-03-11 MED ORDER — LIDOCAINE-EPINEPHRINE 1 %-1:100000 IJ SOLN
INTRAMUSCULAR | Status: DC | PRN
Start: 2024-03-11 — End: 2024-03-12
  Administered 2024-03-11: 10 mL via INTRADERMAL

## 2024-03-11 MED ORDER — BACITRACIN ZINC 500 UNIT/GM EX OINT
TOPICAL_OINTMENT | CUTANEOUS | Status: DC | PRN
Start: 2024-03-11 — End: 2024-03-12
  Administered 2024-03-11: 1 via TOPICAL

## 2024-03-11 MED ORDER — PROPOFOL 10 MG/ML IV BOLUS
INTRAVENOUS | Status: AC
  Filled 2024-03-11: qty 20

## 2024-03-11 MED ORDER — SODIUM CHLORIDE 0.9 % IV SOLN: INTRAVENOUS | Status: DC | PRN

## 2024-03-11 MED ORDER — SUCCINYLCHOLINE CHLORIDE 200 MG/10ML IV SOSY
PREFILLED_SYRINGE | INTRAVENOUS | Status: AC
  Filled 2024-03-11: qty 10

## 2024-03-11 MED ORDER — SUCCINYLCHOLINE 20MG/ML (10ML) SYRINGE FOR MEDFUSION PUMP - OPTIME
INTRAMUSCULAR | Status: DC | PRN
  Administered 2024-03-11: 80 mg via INTRAVENOUS

## 2024-03-11 MED ORDER — MANNITOL 25 % IV SOLN
INTRAVENOUS | Status: DC | PRN
  Administered 2024-03-11: 50 g via INTRAVENOUS

## 2024-03-11 MED ORDER — LIDOCAINE-EPINEPHRINE 1 %-1:100000 IJ SOLN
INTRAMUSCULAR | Status: AC
  Filled 2024-03-11: qty 1

## 2024-03-11 MED ORDER — CEFAZOLIN SODIUM-DEXTROSE 2-4 GM/100ML-% IV SOLN
2.0000 g | INTRAVENOUS | Status: DC
  Filled 2024-03-11: qty 100

## 2024-03-11 MED ORDER — PROPOFOL 10 MG/ML IV BOLUS
INTRAVENOUS | Status: DC | PRN
  Administered 2024-03-11: 100 mg via INTRAVENOUS
  Administered 2024-03-11: 30 mg via INTRAVENOUS

## 2024-03-11 MED ORDER — METOCLOPRAMIDE HCL 5 MG/ML IJ SOLN
5.0000 mg | Freq: Four times a day (QID) | INTRAMUSCULAR | Status: DC | PRN
  Administered 2024-03-11: 5 mg via INTRAVENOUS
  Filled 2024-03-11: qty 2

## 2024-03-11 MED ORDER — PHENYLEPHRINE HCL-NACL 20-0.9 MG/250ML-% IV SOLN
INTRAVENOUS | Status: DC | PRN
Start: 2024-03-11 — End: 2024-03-12
  Administered 2024-03-11: 20 ug/min via INTRAVENOUS

## 2024-03-11 MED ORDER — ROCURONIUM BROMIDE 10 MG/ML (PF) SYRINGE
PREFILLED_SYRINGE | INTRAVENOUS | Status: AC
  Filled 2024-03-11: qty 10

## 2024-03-11 MED ORDER — PHENYLEPHRINE HCL (PRESSORS) 10 MG/ML IV SOLN
INTRAVENOUS | Status: DC | PRN
Start: 2024-03-11 — End: 2024-03-12
  Administered 2024-03-11 (×2): 160 ug via INTRAVENOUS
  Administered 2024-03-11: 80 ug via INTRAVENOUS
  Administered 2024-03-11: 320 ug via INTRAVENOUS

## 2024-03-11 MED ORDER — LORAZEPAM 2 MG/ML IJ SOLN
6.0000 mg | Freq: Once | INTRAMUSCULAR | Status: AC
  Administered 2024-03-11: 6 mg via INTRAVENOUS
  Filled 2024-03-11: qty 3

## 2024-03-11 MED ORDER — THROMBIN 5000 UNITS EX KIT
PACK | CUTANEOUS | Status: AC
  Filled 2024-03-11: qty 2

## 2024-03-11 MED ORDER — LIDOCAINE 2% (20 MG/ML) 5 ML SYRINGE
INTRAMUSCULAR | Status: AC
  Filled 2024-03-11: qty 5

## 2024-03-11 MED ORDER — FENTANYL CITRATE (PF) 250 MCG/5ML IJ SOLN
INTRAMUSCULAR | Status: AC
  Filled 2024-03-11: qty 5

## 2024-03-11 MED ORDER — 0.9 % SODIUM CHLORIDE (POUR BTL) OPTIME
TOPICAL | Status: DC | PRN
Start: 2024-03-11 — End: 2024-03-12
  Administered 2024-03-11 (×3): 1000 mL

## 2024-03-11 MED ORDER — SODIUM CHLORIDE 0.9% IV SOLUTION: Freq: Once | INTRAVENOUS | Status: DC

## 2024-03-11 MED ORDER — CEFAZOLIN SODIUM-DEXTROSE 2-3 GM-%(50ML) IV SOLR
INTRAVENOUS | Status: DC | PRN
Start: 2024-03-11 — End: 2024-03-12
  Administered 2024-03-11: 2 g via INTRAVENOUS

## 2024-03-11 MED ORDER — ROCURONIUM 10MG/ML (10ML) SYRINGE FOR MEDFUSION PUMP - OPTIME
INTRAVENOUS | Status: DC | PRN
  Administered 2024-03-11: 10 mg via INTRAVENOUS
  Administered 2024-03-11: 30 mg via INTRAVENOUS

## 2024-03-11 MED ORDER — THROMBIN 20000 UNITS EX SOLR: CUTANEOUS | Status: DC | PRN

## 2024-03-11 MED ORDER — PHENYLEPHRINE 80 MCG/ML (10ML) SYRINGE FOR IV PUSH (FOR BLOOD PRESSURE SUPPORT)
PREFILLED_SYRINGE | INTRAVENOUS | Status: AC
  Filled 2024-03-11: qty 10

## 2024-03-11 MED ORDER — THROMBIN 5000 UNITS EX KIT
PACK | CUTANEOUS | Status: AC
  Filled 2024-03-11: qty 1

## 2024-03-11 SURGICAL SUPPLY — 60 items
BAG COUNTER SPONGE SURGICOUNT (BAG) ×1 IMPLANT
BENZOIN TINCTURE PRP APPL 2/3 (GAUZE/BANDAGES/DRESSINGS) IMPLANT
BLADE CLIPPER SURG (BLADE) ×1 IMPLANT
BLADE ULTRA TIP 2M (BLADE) ×1 IMPLANT
BNDG GAUZE DERMACEA FLUFF 4 (GAUZE/BANDAGES/DRESSINGS) IMPLANT
BUR ACORN 6.0 PRECISION (BURR) ×1 IMPLANT
BUR MATCHSTICK NEURO 3.0 LAGG (BURR) IMPLANT
BUR SPIRAL ROUTER 2.3 (BUR) IMPLANT
CANISTER SUCTION 3000ML PPV (SUCTIONS) ×1 IMPLANT
CATH VENTRIC 35X38 W/TROCAR LG (CATHETERS) IMPLANT
CLIP TI MEDIUM 6 (CLIP) IMPLANT
COVER BURR HOLE 20 W/TAB UNI (Plate) IMPLANT
COVER BURR HOLE UNIV 10 (Orthopedic Implant) IMPLANT
DRAIN CHANNEL 10M FLAT 3/4 FLT (DRAIN) IMPLANT
DRAPE NEUROLOGICAL W/INCISE (DRAPES) ×1 IMPLANT
DRAPE SURG 17X23 STRL (DRAPES) IMPLANT
DRAPE WARM FLUID 44X44 (DRAPES) ×1 IMPLANT
DURAPREP 6ML APPLICATOR 50/CS (WOUND CARE) ×1 IMPLANT
ELECTRODE REM PT RTRN 9FT ADLT (ELECTROSURGICAL) ×1 IMPLANT
EVACUATOR 1/8 PVC DRAIN (DRAIN) IMPLANT
EVACUATOR SILICONE 100CC (DRAIN) IMPLANT
GAUZE 4X4 16PLY ~~LOC~~+RFID DBL (SPONGE) IMPLANT
GAUZE SPONGE 4X4 12PLY STRL (GAUZE/BANDAGES/DRESSINGS) ×1 IMPLANT
GLOVE BIO SURGEON STRL SZ7 (GLOVE) IMPLANT
GLOVE ECLIPSE 6.5 STRL STRAW (GLOVE) ×1 IMPLANT
GLOVE EXAM NITRILE XL STR (GLOVE) IMPLANT
GOWN STRL REUS W/ TWL LRG LVL3 (GOWN DISPOSABLE) ×2 IMPLANT
GOWN STRL REUS W/ TWL XL LVL3 (GOWN DISPOSABLE) IMPLANT
GOWN STRL REUS W/TWL 2XL LVL3 (GOWN DISPOSABLE) IMPLANT
HEMOSTAT SURGICEL 2X14 (HEMOSTASIS) IMPLANT
KIT BASIN OR (CUSTOM PROCEDURE TRAY) ×1 IMPLANT
KIT TURNOVER KIT B (KITS) ×1 IMPLANT
MARKER SKIN DUAL TIP RULER LAB (MISCELLANEOUS) IMPLANT
NDL HYPO 25X1 1.5 SAFETY (NEEDLE) ×1 IMPLANT
NEEDLE HYPO 25X1 1.5 SAFETY (NEEDLE) ×1 IMPLANT
NS IRRIG 1000ML POUR BTL (IV SOLUTION) ×3 IMPLANT
PACK BATTERY CMF DISP FOR DVR (ORTHOPEDIC DISPOSABLE SUPPLIES) IMPLANT
PACK CRANIOTOMY CUSTOM (CUSTOM PROCEDURE TRAY) ×1 IMPLANT
PATTIES SURGICAL .5 X3 (DISPOSABLE) IMPLANT
PERFORATOR LRG 14-11MM (BIT) IMPLANT
PLATE BONE 12 2H TARGET XL (Plate) IMPLANT
PLATE CRANIAL 20 W/TAB F/SHN (Plate) IMPLANT
SCREW UNIII AXS SD 1.5X4 (Screw) IMPLANT
SPONGE NEURO XRAY DETECT 1X3 (DISPOSABLE) IMPLANT
SPONGE SURGIFOAM ABS GEL 100 (HEMOSTASIS) ×1 IMPLANT
STAPLER SKIN PROX 35W (STAPLE) ×1 IMPLANT
SUT 3-0 BLK 1X30 PSL (SUTURE) IMPLANT
SUT ETHILON 3 0 PS 1 (SUTURE) IMPLANT
SUT NURALON 4 0 TR CR/8 (SUTURE) ×3 IMPLANT
SUT STEEL 0 18XMFL TIE 17 (SUTURE) IMPLANT
SUT VIC AB 2-0 CT1 18 (SUTURE) IMPLANT
SUT VIC AB 2-0 CT2 18 VCP726D (SUTURE) ×2 IMPLANT
SYSTEM CSF EXTERNAL DRAINAGE (MISCELLANEOUS) IMPLANT
TOWEL GREEN STERILE (TOWEL DISPOSABLE) ×1 IMPLANT
TOWEL GREEN STERILE FF (TOWEL DISPOSABLE) ×1 IMPLANT
TRAY FOLEY MTR SLVR 16FR STAT (SET/KITS/TRAYS/PACK) ×1 IMPLANT
TUBE CONNECTING 12X1/4 (SUCTIONS) ×1 IMPLANT
TUBING FEATHERFLOW (TUBING) IMPLANT
UNDERPAD 30X36 HEAVY ABSORB (UNDERPADS AND DIAPERS) ×1 IMPLANT
WATER STERILE IRR 1000ML POUR (IV SOLUTION) ×1 IMPLANT

## 2024-03-11 NOTE — Anesthesia Procedure Notes (Signed)
 Procedure Name: Intubation Date/Time: 03/11/2024 10:32 PM  Performed by: Vaughn Zebedee HERO, CRNAPre-anesthesia Checklist: Patient identified, Emergency Drugs available, Suction available and Patient being monitored Patient Re-evaluated:Patient Re-evaluated prior to induction Oxygen Delivery Method: Circle system utilized Preoxygenation: Pre-oxygenation with 100% oxygen Induction Type: IV induction and Rapid sequence Laryngoscope Size: Miller and 2 Grade View: Grade II Tube size: 7.5 mm Number of attempts: 1 Airway Equipment and Method: Stylet Placement Confirmation: ETT inserted through vocal cords under direct vision, positive ETCO2 and breath sounds checked- equal and bilateral Secured at: 21 cm Tube secured with: Tape Dental Injury: Teeth and Oropharynx as per pre-operative assessment

## 2024-03-11 NOTE — Progress Notes (Addendum)
 The patient has become more lethargic throughout the afternoon and evening. Earlier today she was wide awake alert and oriented. This evening she has her eyes closed, will only open eyes to command, provides one word answers, sometimes coherent sometimes not. She does consistently squeeze hands to command and to answer yes/no questions. CT head was obtained which shows stable right convexity SDH. I spoke with her daughter Rollo who arrived at the bedside. 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 This historically can be broken with 6 mg of ativan . The patient was actually nodding her head and squeezing hands to her daughter's questioning  I had a long discussion with both daughters. Her hx of lethargy / catatonia greatly complicates the picture as lethargy is also a classic symptom of brain compression. We will administer 6 mg of ativan  to see if this breaks her state. We understand that ativan  itself can cause sedation. If she were to decline further this likely would prompt a craniotomy for subdural hematoma evacuation. I explained the risks of brain injury, stroke, seizure, bleeding, infection, CSF leak, cardiorespiratory risk, mortality. They verbalized understanding  Addendum: Ativan  given - no change in neurologic status. Per daughter, she classically would be improved by now  Exam: GCS 1E 1V 81M Moving all 4 extremities spontaneously to stimulation Conjugate gaze PERRL  Plan: To OR for R sided craniotomy for subdural hematoma evacuation

## 2024-03-11 NOTE — Anesthesia Procedure Notes (Addendum)
 Arterial Line Insertion Start/End8/31/2025 10:56 PM, 03/11/2024 10:56 PM Performed by: Erma Thom SAUNDERS, MD, anesthesiologist  Patient location: OR. Preanesthetic checklist: patient identified, IV checked, site marked, risks and benefits discussed, surgical consent, monitors and equipment checked, pre-op evaluation, timeout performed and anesthesia consent Patient sedated Left, radial was placed Catheter size: 20 G Hand hygiene performed  and maximum sterile barriers used   Attempts: 1 Procedure performed using ultrasound guided technique. Ultrasound Notes:anatomy identified, needle tip was noted to be adjacent to the nerve/plexus identified and no ultrasound evidence of intravascular and/or intraneural injection Following insertion, dressing applied and Biopatch. Post procedure assessment: normal and unchanged  Patient tolerated the procedure well with no immediate complications. Additional procedure comments: No US  image saved due to emergent nature of procedure.

## 2024-03-11 NOTE — Progress Notes (Signed)
 Assessment 73 y/o F w/ hx HTN who presented after GLF, found to have right convexity SDH and possible CCJ EDH. Stable on repeat imaging  LOS: 0 days    Plan: No surgical intervention at this time for R convexity SDH. Continue observation in stepdown No collar or intervention for CCJ findings SBP<160 AAT DAT Hold off of DVT chemoppx  Subjective: Neurologically stable  Objective: Vital signs in last 24 hours: Temp:  [98 F (36.7 C)-98.4 F (36.9 C)] 98 F (36.7 C) (08/31 0837) Pulse Rate:  [69-81] 71 (08/31 0847) Resp:  [14-18] 14 (08/31 0837) BP: (119-158)/(67-91) 158/77 (08/31 0847) SpO2:  [93 %-97 %] 96 % (08/31 0837)  Intake/Output from previous day: 08/30 0701 - 08/31 0700 In: 960 [P.O.:960] Out: -  Intake/Output this shift: No intake/output data recorded.  Exam: GCS 15 No aphasia/dysarthria FS 5/5 BUE and BLE  Lab Results: Recent Labs    03/09/24 1717  WBC 6.8  HGB 11.6*  HCT 32.2*  PLT 204   BMET Recent Labs    03/09/24 1717  NA 132*  K 4.5  CL 98  CO2 23  GLUCOSE 100*  BUN 9  CREATININE 0.87  CALCIUM  9.3    Studies/Results: CT HEAD WO CONTRAST ( ) Result Date: 03/10/2024 EXAM: CT HEAD WITHOUT CONTRAST 03/10/2024 01:49:43 PM TECHNIQUE: CT of the head was performed without the administration of intravenous contrast. Automated exposure control, iterative reconstruction, and/or weight based adjustment of the mA/kV was utilized to reduce the radiation dose to as low as reasonably achievable. COMPARISON: CT head without contrast 03/09/2024. CLINICAL HISTORY: Subdural hematoma. Chief complaints; Fall. FINDINGS: BRAIN AND VENTRICLES: American convexity subdural hematoma, stable, measuring 5 mm maximally on coronal images. Partial effacement of the sulci was again noted. Midline shift measures up to 9 mm, stable. Minimal blood along the falx, stable. No new hemorrhage is present. ORBITS: No acute abnormality. SINUSES: No acute abnormality. SOFT TISSUES  AND SKULL: No underlying fracture or foreign body is present. IMPRESSION: 1. Stable right convexity subdural hematoma measuring 5 mm maximally on coronal images, with associated partial effacement of the sulci and stable midline shift up to 9 mm. 2. No new hemorrhage. Electronically signed by: Lonni Necessary MD 03/10/2024 02:02 PM EDT RP Workstation: [id number]   MR Cervical Spine Wo Contrast Result Date: 03/09/2024 CLINICAL DATA:  Initial evaluation for acute neck trauma. EXAM: MRI CERVICAL SPINE WITHOUT CONTRAST TECHNIQUE: Multiplanar, multisequence MR imaging of the cervical spine was performed. No intravenous contrast was administered. COMPARISON:  Prior CT from earlier the same day. FINDINGS: Alignment: Straightening of the normal cervical lordosis. No significant listhesis. Vertebrae: Vertebral body height maintained without acute or chronic fracture. Bone marrow signal intensity within normal limits. No discrete or worrisome osseous lesions. No abnormal marrow edema. Cord: Normal signal and morphology. No evidence for traumatic cord injury. Hemorrhage seen ventral to the spinal cord again seen, corresponding with abnormality on prior CT. This appears to be subdural/intradural in location, and likely tracks from the intracranial compartment given findings on prior head CT. This measures up to 4 mm in maximal AP thickness at the level of C7-T1 (series 6, image 8). No significant spinal stenosis. Similarly, small volume hemorrhage measuring up to 4 mm at the cervicomedullary junction also likely tracking from the cranial vault (series 5, image 8). No other signal changes to suggest ligamentous injury. Posterior Fossa, vertebral arteries, paraspinal tissues: Remainder the visualized brain and posterior fossa demonstrate no other acute finding. Changes of chronic microvascular ischemic disease  noted within the visualized pons. Paraspinous soft tissues demonstrate no other acute finding. Normal flow voids  seen within the vertebral arteries bilaterally. Disc levels: C2-C3: Negative interspace.  No canal or foraminal stenosis. C3-C4: Negative interspace. Mild left-sided facet degeneration. No stenosis. C4-C5: Degenerative intervertebral disc space narrowing with diffuse disc osteophyte complex. No spinal stenosis. Moderate left worse than right C5 foraminal narrowing. C5-C6: Degenerate intervertebral disc space narrowing. Left paracentral disc osteophyte complex flattens and indents the left ventral thecal sac (series 9, image 29). Mild cord flattening without cord signal changes. No significant spinal stenosis. Foramina remain patent. C6-C7: Degenerative disc space narrowing with circumferential disc osteophyte complex. No spinal stenosis. Foramina remain patent. C7-T1: Negative interspace. Moderate left with mild right facet hypertrophy. No spinal stenosis. Foramina remain patent. IMPRESSION: 1. Small volume hemorrhage ventral to the spinal cord, corresponding with abnormality on prior CT. This appears to be subdural/intradural in location by MRI, and likely tracks from the intracranial compartment given findings on prior head CT. Similarly, small volume hemorrhage at the cervicomedullary junction also likely emanates from the intracranial compartment. No significant associated spinal stenosis. No evidence for traumatic cord or ligamentous injury. 2. No other evidence for acute traumatic injury within the cervical spine. 3. Multilevel cervical spondylosis without significant spinal stenosis. Moderate left worse than right C5 foraminal narrowing related to disc osteophyte and facet disease. Electronically Signed   By: Morene Hoard M.D.   On: 03/09/2024 23:13   CT Head Wo Contrast Addendum Date: 03/09/2024 ADDENDUM REPORT: 03/09/2024 17:29 ADDENDUM: CT head impressions #1 and #2 and CT cervical spine impression #1 called by telephone at the time of interpretation on 03/09/2024 at 5:11 pm to provider MELANIE  BELFI , who verbally acknowledged these results. The value of cervical spine MRI to help exclude acute ligamentous injury was also discussed at this site. Electronically Signed   By: Rockey Childs D.O.   On: 03/09/2024 17:29   Result Date: 03/09/2024 CLINICAL DATA:  Provided history: Head trauma, minor. Facial trauma, blunt. Neck trauma. EXAM: CT HEAD WITHOUT CONTRAST CT MAXILLOFACIAL WITHOUT CONTRAST CT CERVICAL SPINE WITHOUT CONTRAST TECHNIQUE: Multidetector CT imaging of the head, cervical spine, and maxillofacial structures were performed using the standard protocol without intravenous contrast. Multiplanar CT image reconstructions of the cervical spine and maxillofacial structures were also generated. RADIATION DOSE REDUCTION: This exam was performed according to the departmental dose-optimization program which includes automated exposure control, adjustment of the mA and/or kV according to patient size and/or use of iterative reconstruction technique. COMPARISON:  Brain MRI 10/01/2019. Head CT 09/12/2018. FINDINGS: CT HEAD FINDINGS Brain: No age-advanced or lobar predominant cerebral atrophy. Acute subdural hemorrhage along the right cerebral convexity, measuring up to 8 mm in thickness (for instance as seen on series 4, image 50). Resultant mass effect with 8 mm leftward midline shift. Acute subdural hemorrhage is also present along the right aspect of the falx, measuring up to 5 mm in thickness (for instance as seen on series 4, image 52). Mild asymmetric prominence of the right lateral ventricle, unchanged, chronic and possibly developmental. No demarcated cortical infarct. No evidence of an intracranial mass. Vascular: No hyperdense vessel.  Atherosclerotic calcifications. Skull: No calvarial fracture or aggressive osseous lesion. Traumatic Brain Injury Risk Stratification Skull Fracture: No - Low/mBIG 1 Subdural Hematoma (SDH): Yes Subarachnoid Hemorrhage [facility]): No Epidural Hematoma (EDH): No - Low/mBIG  1 Cerebral contusion, intra-axial, intraparenchymal Hemorrhage (IPH): No Intraventricular Hemorrhage (IVH): No - Low/mBIG 1 Midline Shift > 1mm or Edema/effacement of  sulci/vents: 8 mm leftward midline shift. ---------------------------------------------------- CT MAXILLOFACIAL FINDINGS Osseous: No acute maxillofacial fracture is identified. Orbits: Right periorbital hematoma. No acute finding within the orbits. Sinuses: No significant inflammatory paranasal sinus disease. Small left frontal sinus osteoma. Soft tissues: Right anterior scalp/forehead and periorbital hematoma. CT CERVICAL SPINE FINDINGS Alignment: Nonspecific straightening of the expected cervical lordosis. 2 mm grade 1 anterolisthesis at C7-T1, T1-T2 and T2-T3. Skull base and vertebrae: The basion-dental and atlanto-dental intervals are maintained.No evidence of acute fracture to the cervical spine. Soft tissues and spinal canal: Hyperdensity at the dorsal aspect of the craniocervical junction, measuring 4 mm in thickness. Hyperdensity within the ventral aspect of the cervical spinal canal, measuring up to 4 mm in thickness. This is consistent with epidural hematoma. No prevertebral soft tissue swelling. Disc levels: Cervical spondylosis with multilevel disc space narrowing, disc bulges/central disc protrusions, posterior disc osteophyte complexes and uncovertebral hypertrophy. No appreciable high-grade degenerative spinal canal stenosis. Multilevel bony neural foraminal narrowing. Degenerative changes also present at the C1-C2 articulation. Upper chest: No consolidation within the imaged lung apices. No visible pneumothorax. Other: Prior thyroidectomy. Attempts are being made to reach the ordering provider at this time. IMPRESSION: CT head: 1. Acute subdural hemorrhage along the right cerebral convexity, measuring up to 8 mm in thickness. Resultant mass effect with 8 mm leftward midline shift. 2. Acute subdural hemorrhage also present along the  right aspect of the falx, measuring up to 5 mm in thickness. CT maxillofacial: 1. No evidence of acute maxillofacial fracture. 2. Right anterior scalp/forehead and periorbital hematoma. CT cervical spine: 1. Epidural hematoma at the craniocervical junction and within the cervical spinal canal (measuring up to 4 mm in thickness). A cervical spine MRI is recommended to better assess the extent of this hemorrhage and to exclude spinal cord mass effect. 2. No evidence of an acute cervical spine fracture. 3. Nonspecific straightening of the expected cervical lordosis. 4. Grade 1 anterolisthesis at C7-T1, T1-T2 and T2-T3. 5. Cervical spondylosis as described. Electronically Signed: By: Rockey Childs D.O. On: 03/09/2024 17:08   CT Cervical Spine Wo Contrast Addendum Date: 03/09/2024 ADDENDUM REPORT: 03/09/2024 17:29 ADDENDUM: CT head impressions #1 and #2 and CT cervical spine impression #1 called by telephone at the time of interpretation on 03/09/2024 at 5:11 pm to provider MELANIE BELFI , who verbally acknowledged these results. The value of cervical spine MRI to help exclude acute ligamentous injury was also discussed at this site. Electronically Signed   By: Rockey Childs D.O.   On: 03/09/2024 17:29   Result Date: 03/09/2024 CLINICAL DATA:  Provided history: Head trauma, minor. Facial trauma, blunt. Neck trauma. EXAM: CT HEAD WITHOUT CONTRAST CT MAXILLOFACIAL WITHOUT CONTRAST CT CERVICAL SPINE WITHOUT CONTRAST TECHNIQUE: Multidetector CT imaging of the head, cervical spine, and maxillofacial structures were performed using the standard protocol without intravenous contrast. Multiplanar CT image reconstructions of the cervical spine and maxillofacial structures were also generated. RADIATION DOSE REDUCTION: This exam was performed according to the departmental dose-optimization program which includes automated exposure control, adjustment of the mA and/or kV according to patient size and/or use of iterative  reconstruction technique. COMPARISON:  Brain MRI 10/01/2019. Head CT 09/12/2018. FINDINGS: CT HEAD FINDINGS Brain: No age-advanced or lobar predominant cerebral atrophy. Acute subdural hemorrhage along the right cerebral convexity, measuring up to 8 mm in thickness (for instance as seen on series 4, image 50). Resultant mass effect with 8 mm leftward midline shift. Acute subdural hemorrhage is also present along the right aspect of  the falx, measuring up to 5 mm in thickness (for instance as seen on series 4, image 52). Mild asymmetric prominence of the right lateral ventricle, unchanged, chronic and possibly developmental. No demarcated cortical infarct. No evidence of an intracranial mass. Vascular: No hyperdense vessel.  Atherosclerotic calcifications. Skull: No calvarial fracture or aggressive osseous lesion. Traumatic Brain Injury Risk Stratification Skull Fracture: No - Low/mBIG 1 Subdural Hematoma (SDH): Yes Subarachnoid Hemorrhage [facility]): No Epidural Hematoma (EDH): No - Low/mBIG 1 Cerebral contusion, intra-axial, intraparenchymal Hemorrhage (IPH): No Intraventricular Hemorrhage (IVH): No - Low/mBIG 1 Midline Shift > 1mm or Edema/effacement of sulci/vents: 8 mm leftward midline shift. ---------------------------------------------------- CT MAXILLOFACIAL FINDINGS Osseous: No acute maxillofacial fracture is identified. Orbits: Right periorbital hematoma. No acute finding within the orbits. Sinuses: No significant inflammatory paranasal sinus disease. Small left frontal sinus osteoma. Soft tissues: Right anterior scalp/forehead and periorbital hematoma. CT CERVICAL SPINE FINDINGS Alignment: Nonspecific straightening of the expected cervical lordosis. 2 mm grade 1 anterolisthesis at C7-T1, T1-T2 and T2-T3. Skull base and vertebrae: The basion-dental and atlanto-dental intervals are maintained.No evidence of acute fracture to the cervical spine. Soft tissues and spinal canal: Hyperdensity at the dorsal aspect of  the craniocervical junction, measuring 4 mm in thickness. Hyperdensity within the ventral aspect of the cervical spinal canal, measuring up to 4 mm in thickness. This is consistent with epidural hematoma. No prevertebral soft tissue swelling. Disc levels: Cervical spondylosis with multilevel disc space narrowing, disc bulges/central disc protrusions, posterior disc osteophyte complexes and uncovertebral hypertrophy. No appreciable high-grade degenerative spinal canal stenosis. Multilevel bony neural foraminal narrowing. Degenerative changes also present at the C1-C2 articulation. Upper chest: No consolidation within the imaged lung apices. No visible pneumothorax. Other: Prior thyroidectomy. Attempts are being made to reach the ordering provider at this time. IMPRESSION: CT head: 1. Acute subdural hemorrhage along the right cerebral convexity, measuring up to 8 mm in thickness. Resultant mass effect with 8 mm leftward midline shift. 2. Acute subdural hemorrhage also present along the right aspect of the falx, measuring up to 5 mm in thickness. CT maxillofacial: 1. No evidence of acute maxillofacial fracture. 2. Right anterior scalp/forehead and periorbital hematoma. CT cervical spine: 1. Epidural hematoma at the craniocervical junction and within the cervical spinal canal (measuring up to 4 mm in thickness). A cervical spine MRI is recommended to better assess the extent of this hemorrhage and to exclude spinal cord mass effect. 2. No evidence of an acute cervical spine fracture. 3. Nonspecific straightening of the expected cervical lordosis. 4. Grade 1 anterolisthesis at C7-T1, T1-T2 and T2-T3. 5. Cervical spondylosis as described. Electronically Signed: By: Rockey Childs D.O. On: 03/09/2024 17:08   CT Maxillofacial WO CM Addendum Date: 03/09/2024 ADDENDUM REPORT: 03/09/2024 17:29 ADDENDUM: CT head impressions #1 and #2 and CT cervical spine impression #1 called by telephone at the time of interpretation on  03/09/2024 at 5:11 pm to provider MELANIE BELFI , who verbally acknowledged these results. The value of cervical spine MRI to help exclude acute ligamentous injury was also discussed at this site. Electronically Signed   By: Rockey Childs D.O.   On: 03/09/2024 17:29   Result Date: 03/09/2024 CLINICAL DATA:  Provided history: Head trauma, minor. Facial trauma, blunt. Neck trauma. EXAM: CT HEAD WITHOUT CONTRAST CT MAXILLOFACIAL WITHOUT CONTRAST CT CERVICAL SPINE WITHOUT CONTRAST TECHNIQUE: Multidetector CT imaging of the head, cervical spine, and maxillofacial structures were performed using the standard protocol without intravenous contrast. Multiplanar CT image reconstructions of the cervical spine and maxillofacial  structures were also generated. RADIATION DOSE REDUCTION: This exam was performed according to the departmental dose-optimization program which includes automated exposure control, adjustment of the mA and/or kV according to patient size and/or use of iterative reconstruction technique. COMPARISON:  Brain MRI 10/01/2019. Head CT 09/12/2018. FINDINGS: CT HEAD FINDINGS Brain: No age-advanced or lobar predominant cerebral atrophy. Acute subdural hemorrhage along the right cerebral convexity, measuring up to 8 mm in thickness (for instance as seen on series 4, image 50). Resultant mass effect with 8 mm leftward midline shift. Acute subdural hemorrhage is also present along the right aspect of the falx, measuring up to 5 mm in thickness (for instance as seen on series 4, image 52). Mild asymmetric prominence of the right lateral ventricle, unchanged, chronic and possibly developmental. No demarcated cortical infarct. No evidence of an intracranial mass. Vascular: No hyperdense vessel.  Atherosclerotic calcifications. Skull: No calvarial fracture or aggressive osseous lesion. Traumatic Brain Injury Risk Stratification Skull Fracture: No - Low/mBIG 1 Subdural Hematoma (SDH): Yes Subarachnoid Hemorrhage [facility]):  No Epidural Hematoma (EDH): No - Low/mBIG 1 Cerebral contusion, intra-axial, intraparenchymal Hemorrhage (IPH): No Intraventricular Hemorrhage (IVH): No - Low/mBIG 1 Midline Shift > 1mm or Edema/effacement of sulci/vents: 8 mm leftward midline shift. ---------------------------------------------------- CT MAXILLOFACIAL FINDINGS Osseous: No acute maxillofacial fracture is identified. Orbits: Right periorbital hematoma. No acute finding within the orbits. Sinuses: No significant inflammatory paranasal sinus disease. Small left frontal sinus osteoma. Soft tissues: Right anterior scalp/forehead and periorbital hematoma. CT CERVICAL SPINE FINDINGS Alignment: Nonspecific straightening of the expected cervical lordosis. 2 mm grade 1 anterolisthesis at C7-T1, T1-T2 and T2-T3. Skull base and vertebrae: The basion-dental and atlanto-dental intervals are maintained.No evidence of acute fracture to the cervical spine. Soft tissues and spinal canal: Hyperdensity at the dorsal aspect of the craniocervical junction, measuring 4 mm in thickness. Hyperdensity within the ventral aspect of the cervical spinal canal, measuring up to 4 mm in thickness. This is consistent with epidural hematoma. No prevertebral soft tissue swelling. Disc levels: Cervical spondylosis with multilevel disc space narrowing, disc bulges/central disc protrusions, posterior disc osteophyte complexes and uncovertebral hypertrophy. No appreciable high-grade degenerative spinal canal stenosis. Multilevel bony neural foraminal narrowing. Degenerative changes also present at the C1-C2 articulation. Upper chest: No consolidation within the imaged lung apices. No visible pneumothorax. Other: Prior thyroidectomy. Attempts are being made to reach the ordering provider at this time. IMPRESSION: CT head: 1. Acute subdural hemorrhage along the right cerebral convexity, measuring up to 8 mm in thickness. Resultant mass effect with 8 mm leftward midline shift. 2. Acute  subdural hemorrhage also present along the right aspect of the falx, measuring up to 5 mm in thickness. CT maxillofacial: 1. No evidence of acute maxillofacial fracture. 2. Right anterior scalp/forehead and periorbital hematoma. CT cervical spine: 1. Epidural hematoma at the craniocervical junction and within the cervical spinal canal (measuring up to 4 mm in thickness). A cervical spine MRI is recommended to better assess the extent of this hemorrhage and to exclude spinal cord mass effect. 2. No evidence of an acute cervical spine fracture. 3. Nonspecific straightening of the expected cervical lordosis. 4. Grade 1 anterolisthesis at C7-T1, T1-T2 and T2-T3. 5. Cervical spondylosis as described. Electronically Signed: By: Rockey Childs D.O. On: 03/09/2024 17:08   DG Finger Thumb Right Result Date: 03/09/2024 CLINICAL DATA:  Thumb injury EXAM: RIGHT THUMB 2+V COMPARISON:  None Available. FINDINGS: No fracture or malalignment. Mild degenerative change at the IP and first [facility] joint. No radiopaque foreign body in  the soft tissues IMPRESSION: No acute osseous abnormality. Electronically Signed   By: Luke Bun M.D.   On: 03/09/2024 16:46      Dorn JONELLE Glade 03/11/2024, 9:09 AM

## 2024-03-11 NOTE — Progress Notes (Signed)
 Spoke with patient daughter [name] about update on mother. Daughter requested that MD give her a call to discuss CT. Message send to MD. Fredie LITTIE Morones, RN]

## 2024-03-11 NOTE — Plan of Care (Signed)

## 2024-03-11 NOTE — Progress Notes (Signed)
 Assessment 73 y/o F w/ hx HTN who presented after GLF, found to have right convexity SDH and possible CCJ EDH. Stable on repeat imaging  LOS: 0 days    Plan: CT head reviewed - unchanged. Continue close monitoring. Q2 neuro checks No collar or intervention for CCJ findings SBP<140 AAT DAT Hold off of DVT chemoppx  Subjective: Pt had a fall in the bathroom prompting a CT head  Objective: Vital signs in last 24 hours: Temp:  [98 F (36.7 C)-98.3 F (36.8 C)] 98.1 F (36.7 C) (08/31 1712) Pulse Rate:  [70-78] 70 (08/31 1234) Resp:  [14-18] 14 (08/31 1712) BP: (119-162)/(67-83) 161/77 (08/31 1712) SpO2:  [93 %-96 %] 96 % (08/31 1712)  Intake/Output from previous day: 08/30 0701 - 08/31 0700 In: 960 [P.O.:960] Out: -  Intake/Output this shift: No intake/output data recorded.  Exam: GCS 4E 4V 36M Arouses easily Aox3, although slightly confused with conversation and forgetful of her recent fall No aphasia/dysarthria FS 5/5 BUE and BLE  Lab Results: Recent Labs    03/09/24 1717  WBC 6.8  HGB 11.6*  HCT 32.2*  PLT 204   BMET Recent Labs    03/09/24 1717  NA 132*  K 4.5  CL 98  CO2 23  GLUCOSE 100*  BUN 9  CREATININE 0.87  CALCIUM  9.3    Studies/Results: CT HEAD WO CONTRAST ( ) Result Date: 03/11/2024 EXAM: CT HEAD WITHOUT CONTRAST 03/11/2024 05:55:09 PM TECHNIQUE: CT of the head was performed without the administration of intravenous contrast. Automated exposure control, iterative reconstruction, and/or weight based adjustment of the mA/kV was utilized to reduce the radiation dose to as low as reasonably achievable. COMPARISON: CT of the head without contrast 03/10/2024. CLINICAL HISTORY: Head trauma, abnormal mental status (Age 2-64y); Fall. Pt fell in bathroom, in hospital room. Possible seizure. FINDINGS: BRAIN AND VENTRICLES: The mixed density subdural collection over the right convexity is stable in size, measuring approximately 6 mm. Mass effect and  midline shift are stable to slightly increased, now measuring 10 mm. Minimal blood is again noted along the anterior cerebral falx. No new hemorrhage is present. ORBITS: No acute abnormality. SINUSES: No acute abnormality. SOFT TISSUES AND SKULL: No acute soft tissue abnormality. No skull fracture. IMPRESSION: 1. Stable mixed density subdural collection over the right convexity measuring approximately 6 mm. 2. Stable to slightly increased mass effect and midline shift, now measuring 10 mm. 3. Minimal blood along the anterior cerebral falx. No new hemorrhage. Electronically signed by: Lonni Necessary MD 03/11/2024 06:13 PM EDT RP Workstation: [id number]   CT HEAD WO CONTRAST ( ) Result Date: 03/10/2024 EXAM: CT HEAD WITHOUT CONTRAST 03/10/2024 01:49:43 PM TECHNIQUE: CT of the head was performed without the administration of intravenous contrast. Automated exposure control, iterative reconstruction, and/or weight based adjustment of the mA/kV was utilized to reduce the radiation dose to as low as reasonably achievable. COMPARISON: CT head without contrast 03/09/2024. CLINICAL HISTORY: Subdural hematoma. Chief complaints; Fall. FINDINGS: BRAIN AND VENTRICLES: American convexity subdural hematoma, stable, measuring 5 mm maximally on coronal images. Partial effacement of the sulci was again noted. Midline shift measures up to 9 mm, stable. Minimal blood along the falx, stable. No new hemorrhage is present. ORBITS: No acute abnormality. SINUSES: No acute abnormality. SOFT TISSUES AND SKULL: No underlying fracture or foreign body is present. IMPRESSION: 1. Stable right convexity subdural hematoma measuring 5 mm maximally on coronal images, with associated partial effacement of the sulci and stable midline shift up to 9 mm. 2. No  new hemorrhage. Electronically signed by: Lonni Necessary MD 03/10/2024 02:02 PM EDT RP Workstation: [id number]   MR Cervical Spine Wo Contrast Result Date: 03/09/2024 CLINICAL  DATA:  Initial evaluation for acute neck trauma. EXAM: MRI CERVICAL SPINE WITHOUT CONTRAST TECHNIQUE: Multiplanar, multisequence MR imaging of the cervical spine was performed. No intravenous contrast was administered. COMPARISON:  Prior CT from earlier the same day. FINDINGS: Alignment: Straightening of the normal cervical lordosis. No significant listhesis. Vertebrae: Vertebral body height maintained without acute or chronic fracture. Bone marrow signal intensity within normal limits. No discrete or worrisome osseous lesions. No abnormal marrow edema. Cord: Normal signal and morphology. No evidence for traumatic cord injury. Hemorrhage seen ventral to the spinal cord again seen, corresponding with abnormality on prior CT. This appears to be subdural/intradural in location, and likely tracks from the intracranial compartment given findings on prior head CT. This measures up to 4 mm in maximal AP thickness at the level of C7-T1 (series 6, image 8). No significant spinal stenosis. Similarly, small volume hemorrhage measuring up to 4 mm at the cervicomedullary junction also likely tracking from the cranial vault (series 5, image 8). No other signal changes to suggest ligamentous injury. Posterior Fossa, vertebral arteries, paraspinal tissues: Remainder the visualized brain and posterior fossa demonstrate no other acute finding. Changes of chronic microvascular ischemic disease noted within the visualized pons. Paraspinous soft tissues demonstrate no other acute finding. Normal flow voids seen within the vertebral arteries bilaterally. Disc levels: C2-C3: Negative interspace.  No canal or foraminal stenosis. C3-C4: Negative interspace. Mild left-sided facet degeneration. No stenosis. C4-C5: Degenerative intervertebral disc space narrowing with diffuse disc osteophyte complex. No spinal stenosis. Moderate left worse than right C5 foraminal narrowing. C5-C6: Degenerate intervertebral disc space narrowing. Left  paracentral disc osteophyte complex flattens and indents the left ventral thecal sac (series 9, image 29). Mild cord flattening without cord signal changes. No significant spinal stenosis. Foramina remain patent. C6-C7: Degenerative disc space narrowing with circumferential disc osteophyte complex. No spinal stenosis. Foramina remain patent. C7-T1: Negative interspace. Moderate left with mild right facet hypertrophy. No spinal stenosis. Foramina remain patent. IMPRESSION: 1. Small volume hemorrhage ventral to the spinal cord, corresponding with abnormality on prior CT. This appears to be subdural/intradural in location by MRI, and likely tracks from the intracranial compartment given findings on prior head CT. Similarly, small volume hemorrhage at the cervicomedullary junction also likely emanates from the intracranial compartment. No significant associated spinal stenosis. No evidence for traumatic cord or ligamentous injury. 2. No other evidence for acute traumatic injury within the cervical spine. 3. Multilevel cervical spondylosis without significant spinal stenosis. Moderate left worse than right C5 foraminal narrowing related to disc osteophyte and facet disease. Electronically Signed   By: Morene Hoard M.D.   On: 03/09/2024 23:13      Dorn JONELLE Glade 03/11/2024, 6:49 PM

## 2024-03-11 NOTE — Progress Notes (Signed)
 03/11/24 1725  What Happened  Was fall witnessed? Yes  Who witnessed fall? Shakur Lembo P RN  Patients activity before fall bathroom-assisted  Point of contact head  Was patient injured? Yes (hit rt back of head on sick)  Provider Notification  Provider Name/Title Garst MD (Office page)  Date Provider Notified 03/11/24  Time Provider Notified 1725  Method of Notification Page  Notification Reason Other (Comment) (Patient fall assisted. Neuro changes noted)  Provider response See new orders  Date of Provider Response 03/11/24  Time of Provider Response 1730  Follow Up  Family notified Yes - comment  Time family notified [phone]  Additional tests No  Simple treatment Other (comment) (Pain medication)  Progress note created (see row info) Yes  Adult Fall Risk Assessment  Risk Factor Category (scoring not indicated) History of more than one fall within 6 months before admission (document High fall risk);Fall has occurred during this admission (document High fall risk);High fall risk per protocol (document High fall risk)  Patient Fall Risk Level High fall risk  Adult Fall Risk Interventions  Required Bundle Interventions *See Row Information* High fall risk - low, moderate, and high requirements implemented  Additional Interventions PT/OT need assessed if change in mobility from baseline;Use of appropriate toileting equipment (bedpan, BSC, etc.)  Fall intervention(s) refused/Patient educated regarding refusal Supervision while toileting/edge of bed sitting  Screening for Fall Injury Risk (To be completed on HIGH fall risk patients) - Assessing Need for Floor Mats  Risk For Fall Injury- Criteria for Floor Mats Admitted as a result of a fall  Will Implement Floor Mats Yes  Vitals  Temp 98.1 F (36.7 C)  Temp Source Oral  BP (!) 161/77  MAP (mmHg) 100  BP Location Right Arm  BP Method Automatic  Patient Position (if appropriate) Lying  Pulse Rate 70  Pulse Rate Source Monitor  ECG  Heart Rate 73  Cardiac Rhythm NSR  Resp 14  Oxygen Therapy  SpO2 96 %  O2 Device Room Air  Patient Activity (if Appropriate) In bed  Pulse Oximetry Type Continuous  Neurological  Neuro (WDL) X  Level of Consciousness Alert  Orientation Level Oriented to person;Oriented to place  Cognition Appropriate at baseline  Speech Clear  R Pupil Size (mm) 3  R Pupil Shape Round  R Pupil Reaction Brisk  L Pupil Size (mm) 3  L Pupil Shape Round  L Pupil Reaction Brisk  Motor Function/Sensation Assessment Grip;Motor response;Sensation;Motor strength  R Hand Grip Moderate  L Hand Grip Moderate  RUE Motor Response Purposeful movement  RUE Sensation Full sensation  RUE Motor Strength 4  LUE Motor Response Purposeful movement  LUE Sensation Full sensation  LUE Motor Strength 4  RLE Motor Response Purposeful movement  RLE Sensation Full sensation  RLE Motor Strength 4  LLE Motor Response Purposeful movement  LLE Sensation Full sensation  LLE Motor Strength 4  Neuro Symptoms Fatigue;Forgetful;Tremors  Neuro symptoms relieved by Rest  Neuro Additional Assessments Glasgow Coma Scale  Glasgow Coma Scale  Eye Opening 4  Best Verbal Response (NON-intubated) 5  Best Motor Response 6  Glasgow Coma Scale Score 15  Musculoskeletal  Musculoskeletal (WDL) WDL  Assistive Device None  Musculoskeletal Details  RUE Full movement  LUE Full movement  RLE Full movement  LLE Full movement  Integumentary  Integumentary (WDL) X  Skin Color Appropriate for ethnicity  Skin Condition Dry  Skin Integrity Intact;Abrasion;Ecchymosis  Abrasion Location Arm;Finger (Comment which one);Eye;Head  Abrasion Location Orientation Right  Abrasion Intervention Gauze;Cleansed  Ecchymosis Location Eye  Ecchymosis Location Orientation Right  Skin Turgor Non-tenting

## 2024-03-11 NOTE — Anesthesia Preprocedure Evaluation (Signed)
 Anesthesia Evaluation  Patient identified by MRN, date of birth, ID band Patient unresponsive    Reviewed: Allergy & Precautions, H&P , NPO status , Patient's Chart, lab work & pertinent test results, Unable to perform ROS - Chart review onlyPreop documentation limited or incomplete due to emergent nature of procedure.  Airway Mallampati: III  TM Distance: >3 FB Neck ROM: Full    Dental no notable dental hx.    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  Lorazepam  administered today with no improvement in neuro status.     GI/Hepatic Neg liver ROS,GERD  ,,  Endo/Other  negative endocrine ROS    Renal/GU negative Renal ROS  negative genitourinary   Musculoskeletal negative musculoskeletal ROS (+)    Abdominal   Peds negative pediatric ROS (+)  Hematology Hg 11.6   Anesthesia Other Findings   Reproductive/Obstetrics negative OB ROS                              Anesthesia Physical Anesthesia Plan  ASA: 3 and emergent  Anesthesia Plan: General   Post-op Pain Management:    Induction: Intravenous and Rapid sequence  PONV Risk Score and Plan: 3 and Ondansetron , Dexamethasone  and Treatment may vary due to age or medical condition  Airway Management Planned: Oral ETT  Additional Equipment: Arterial line  Intra-op Plan:   Post-operative Plan: Extubation in OR  Informed Consent: I have reviewed the patients History and Physical, chart, labs and discussed the procedure including the risks, benefits and alternatives for the proposed anesthesia with the patient or authorized  representative who has indicated his/her understanding and acceptance.     Dental advisory given  Plan Discussed with: CRNA  Anesthesia Plan Comments:         Anesthesia Quick Evaluation

## 2024-03-11 NOTE — Progress Notes (Signed)
 Nurse called Daughter [name] back to give update on CT scan. Daughter has no further questions at this time. Fredie LITTIE Morones, RN

## 2024-03-12 ENCOUNTER — Other Ambulatory Visit: Payer: Self-pay

## 2024-03-12 ENCOUNTER — Observation Stay (HOSPITAL_COMMUNITY)

## 2024-03-12 DIAGNOSIS — Z79899 Other long term (current) drug therapy: Secondary | ICD-10-CM | POA: Diagnosis not present

## 2024-03-12 DIAGNOSIS — K219 Gastro-esophageal reflux disease without esophagitis: Secondary | ICD-10-CM

## 2024-03-12 DIAGNOSIS — I10 Essential (primary) hypertension: Secondary | ICD-10-CM

## 2024-03-12 DIAGNOSIS — E039 Hypothyroidism, unspecified: Secondary | ICD-10-CM | POA: Diagnosis not present

## 2024-03-12 DIAGNOSIS — F061 Catatonic disorder due to known physiological condition: Secondary | ICD-10-CM | POA: Diagnosis present

## 2024-03-12 DIAGNOSIS — J690 Pneumonitis due to inhalation of food and vomit: Secondary | ICD-10-CM | POA: Diagnosis not present

## 2024-03-12 DIAGNOSIS — I82B11 Acute embolism and thrombosis of right subclavian vein: Secondary | ICD-10-CM | POA: Diagnosis not present

## 2024-03-12 DIAGNOSIS — E871 Hypo-osmolality and hyponatremia: Secondary | ICD-10-CM

## 2024-03-12 DIAGNOSIS — G9340 Encephalopathy, unspecified: Secondary | ICD-10-CM | POA: Diagnosis not present

## 2024-03-12 DIAGNOSIS — J45909 Unspecified asthma, uncomplicated: Secondary | ICD-10-CM | POA: Diagnosis present

## 2024-03-12 DIAGNOSIS — E870 Hyperosmolality and hypernatremia: Secondary | ICD-10-CM | POA: Diagnosis present

## 2024-03-12 DIAGNOSIS — Z79890 Hormone replacement therapy (postmenopausal): Secondary | ICD-10-CM | POA: Diagnosis not present

## 2024-03-12 DIAGNOSIS — W06XXXA Fall from bed, initial encounter: Secondary | ICD-10-CM | POA: Diagnosis present

## 2024-03-12 DIAGNOSIS — D649 Anemia, unspecified: Secondary | ICD-10-CM | POA: Diagnosis not present

## 2024-03-12 DIAGNOSIS — S061XAA Traumatic cerebral edema with loss of consciousness status unknown, initial encounter: Secondary | ICD-10-CM | POA: Diagnosis present

## 2024-03-12 DIAGNOSIS — T82868A Thrombosis of vascular prosthetic devices, implants and grafts, initial encounter: Secondary | ICD-10-CM | POA: Diagnosis not present

## 2024-03-12 DIAGNOSIS — E876 Hypokalemia: Secondary | ICD-10-CM | POA: Diagnosis present

## 2024-03-12 DIAGNOSIS — S14101A Unspecified injury at C1 level of cervical spinal cord, initial encounter: Secondary | ICD-10-CM | POA: Diagnosis present

## 2024-03-12 DIAGNOSIS — I82A11 Acute embolism and thrombosis of right axillary vein: Secondary | ICD-10-CM | POA: Diagnosis not present

## 2024-03-12 DIAGNOSIS — R1310 Dysphagia, unspecified: Secondary | ICD-10-CM | POA: Diagnosis not present

## 2024-03-12 DIAGNOSIS — Y848 Other medical procedures as the cause of abnormal reaction of the patient, or of later complication, without mention of misadventure at the time of the procedure: Secondary | ICD-10-CM | POA: Diagnosis not present

## 2024-03-12 DIAGNOSIS — F32A Depression, unspecified: Secondary | ICD-10-CM | POA: Diagnosis present

## 2024-03-12 DIAGNOSIS — S065XAA Traumatic subdural hemorrhage with loss of consciousness status unknown, initial encounter: Secondary | ICD-10-CM

## 2024-03-12 DIAGNOSIS — Z7901 Long term (current) use of anticoagulants: Secondary | ICD-10-CM | POA: Diagnosis not present

## 2024-03-12 DIAGNOSIS — S06A0XA Traumatic brain compression without herniation, initial encounter: Secondary | ICD-10-CM | POA: Diagnosis present

## 2024-03-12 DIAGNOSIS — E861 Hypovolemia: Secondary | ICD-10-CM | POA: Diagnosis present

## 2024-03-12 DIAGNOSIS — S51811A Laceration without foreign body of right forearm, initial encounter: Secondary | ICD-10-CM | POA: Diagnosis not present

## 2024-03-12 DIAGNOSIS — M7989 Other specified soft tissue disorders: Secondary | ICD-10-CM | POA: Diagnosis not present

## 2024-03-12 DIAGNOSIS — Y9301 Activity, walking, marching and hiking: Secondary | ICD-10-CM | POA: Diagnosis present

## 2024-03-12 DIAGNOSIS — D638 Anemia in other chronic diseases classified elsewhere: Secondary | ICD-10-CM | POA: Diagnosis present

## 2024-03-12 DIAGNOSIS — R569 Unspecified convulsions: Secondary | ICD-10-CM | POA: Diagnosis not present

## 2024-03-12 DIAGNOSIS — S064XAA Epidural hemorrhage with loss of consciousness status unknown, initial encounter: Secondary | ICD-10-CM | POA: Diagnosis present

## 2024-03-12 DIAGNOSIS — W450XXA Nail entering through skin, initial encounter: Secondary | ICD-10-CM | POA: Diagnosis not present

## 2024-03-12 LAB — CBC
HCT: 27.4 % — ABNORMAL LOW (ref 36.0–46.0)
Hemoglobin: 9.7 g/dL — ABNORMAL LOW (ref 12.0–15.0)
MCH: 33 pg (ref 26.0–34.0)
MCHC: 35.4 g/dL (ref 30.0–36.0)
MCV: 93.2 fL (ref 80.0–100.0)
Platelets: 176 K/uL (ref 150–400)
RBC: 2.94 MIL/uL — ABNORMAL LOW (ref 3.87–5.11)
RDW: 11.9 % (ref 11.5–15.5)
WBC: 7.1 K/uL (ref 4.0–10.5)
nRBC: 0 % (ref 0.0–0.2)

## 2024-03-12 LAB — POCT I-STAT 7, (LYTES, BLD GAS, ICA,H+H)
Acid-base deficit: 6 mmol/L — ABNORMAL HIGH (ref 0.0–2.0)
Bicarbonate: 19.6 mmol/L — ABNORMAL LOW (ref 20.0–28.0)
Calcium, Ion: 1.08 mmol/L — ABNORMAL LOW (ref 1.15–1.40)
HCT: 22 % — ABNORMAL LOW (ref 36.0–46.0)
Hemoglobin: 7.5 g/dL — ABNORMAL LOW (ref 12.0–15.0)
O2 Saturation: 100 %
Patient temperature: 35.4
Potassium: 3.1 mmol/L — ABNORMAL LOW (ref 3.5–5.1)
Sodium: 125 mmol/L — ABNORMAL LOW (ref 135–145)
TCO2: 21 mmol/L — ABNORMAL LOW (ref 22–32)
pCO2 arterial: 36.2 mmHg (ref 32–48)
pH, Arterial: 7.333 — ABNORMAL LOW (ref 7.35–7.45)
pO2, Arterial: 220 mmHg — ABNORMAL HIGH (ref 83–108)

## 2024-03-12 LAB — CBC WITH DIFFERENTIAL/PLATELET
Abs Immature Granulocytes: 0.03 K/uL (ref 0.00–0.07)
Basophils Absolute: 0 K/uL (ref 0.0–0.1)
Basophils Relative: 0 %
Eosinophils Absolute: 0 K/uL (ref 0.0–0.5)
Eosinophils Relative: 0 %
HCT: 28 % — ABNORMAL LOW (ref 36.0–46.0)
Hemoglobin: 10 g/dL — ABNORMAL LOW (ref 12.0–15.0)
Immature Granulocytes: 0 %
Lymphocytes Relative: 5 %
Lymphs Abs: 0.4 K/uL — ABNORMAL LOW (ref 0.7–4.0)
MCH: 33.3 pg (ref 26.0–34.0)
MCHC: 35.7 g/dL (ref 30.0–36.0)
MCV: 93.3 fL (ref 80.0–100.0)
Monocytes Absolute: 0.6 K/uL (ref 0.1–1.0)
Monocytes Relative: 7 %
Neutro Abs: 7.3 K/uL (ref 1.7–7.7)
Neutrophils Relative %: 88 %
Platelets: 178 K/uL (ref 150–400)
RBC: 3 MIL/uL — ABNORMAL LOW (ref 3.87–5.11)
RDW: 12.1 % (ref 11.5–15.5)
WBC: 8.3 K/uL (ref 4.0–10.5)
nRBC: 0 % (ref 0.0–0.2)

## 2024-03-12 LAB — BASIC METABOLIC PANEL WITH GFR
Anion gap: 8 (ref 5–15)
BUN: 9 mg/dL (ref 8–23)
CO2: 21 mmol/L — ABNORMAL LOW (ref 22–32)
Calcium: 8 mg/dL — ABNORMAL LOW (ref 8.9–10.3)
Chloride: 99 mmol/L (ref 98–111)
Creatinine, Ser: 0.65 mg/dL (ref 0.44–1.00)
GFR, Estimated: >60 mL/min (ref >60)
Glucose, Bld: 137 mg/dL — ABNORMAL HIGH (ref 70–99)
Potassium: 3.4 mmol/L — ABNORMAL LOW (ref 3.5–5.1)
Sodium: 128 mmol/L — ABNORMAL LOW (ref 135–145)

## 2024-03-12 LAB — PREPARE RBC (CROSSMATCH)

## 2024-03-12 LAB — GLUCOSE, CAPILLARY
Glucose-Capillary: 138 mg/dL — ABNORMAL HIGH (ref 70–99)
Glucose-Capillary: 128 mg/dL — ABNORMAL HIGH (ref 70–99)
Glucose-Capillary: 117 mg/dL — ABNORMAL HIGH (ref 70–99)
Glucose-Capillary: 137 mg/dL — ABNORMAL HIGH (ref 70–99)
Glucose-Capillary: 162 mg/dL — ABNORMAL HIGH (ref 70–99)

## 2024-03-12 LAB — MRSA NEXT GEN BY PCR, NASAL: MRSA by PCR Next Gen: NOT DETECTED

## 2024-03-12 LAB — ABO/RH: ABO/RH(D): O POS

## 2024-03-12 MED ORDER — LAMOTRIGINE 25 MG PO TABS: 150.0000 mg | ORAL_TABLET | Freq: Every day | ORAL | Status: DC

## 2024-03-12 MED ORDER — SUGAMMADEX SODIUM 200 MG/2ML IV SOLN
INTRAVENOUS | Status: DC | PRN
  Administered 2024-03-12: 200 mg via INTRAVENOUS

## 2024-03-12 MED ORDER — LEVETIRACETAM (KEPPRA) 500 MG/5 ML ADULT IV PUSH
500.0000 mg | Freq: Two times a day (BID) | INTRAVENOUS | Status: DC
  Administered 2024-03-12 – 2024-03-13 (×3): 500 mg via INTRAVENOUS
  Filled 2024-03-12 (×3): qty 5

## 2024-03-12 MED ORDER — LAMOTRIGINE 25 MG PO TABS
150.0000 mg | ORAL_TABLET | Freq: Every day | ORAL | Status: DC
Start: 2024-03-12 — End: 2024-03-15
  Administered 2024-03-12 – 2024-03-15 (×4): 150 mg via ORAL
  Filled 2024-03-12 (×4): qty 2

## 2024-03-12 MED ORDER — ONDANSETRON HCL 4 MG/2ML IJ SOLN
4.0000 mg | INTRAMUSCULAR | Status: DC | PRN
  Administered 2024-03-13 (×2): 4 mg via INTRAVENOUS
  Filled 2024-03-12 (×2): qty 2

## 2024-03-12 MED ORDER — POTASSIUM CHLORIDE 10 MEQ/100ML IV SOLN
10.0000 meq | INTRAVENOUS | Status: AC
  Administered 2024-03-12 (×4): 10 meq via INTRAVENOUS
  Filled 2024-03-12 (×4): qty 100

## 2024-03-12 MED ORDER — OXYCODONE HCL 5 MG/5ML PO SOLN: 5.0000 mg | Freq: Once | ORAL | Status: DC | PRN

## 2024-03-12 MED ORDER — CHLORHEXIDINE GLUCONATE CLOTH 2 % EX PADS
6.0000 | MEDICATED_PAD | Freq: Every day | CUTANEOUS | Status: DC
  Administered 2024-03-12 – 2024-03-29 (×18): 6 via TOPICAL

## 2024-03-12 MED ORDER — FENTANYL CITRATE (PF) 100 MCG/2ML IJ SOLN: 25.0000 ug | INTRAMUSCULAR | Status: DC | PRN

## 2024-03-12 MED ORDER — DROPERIDOL 2.5 MG/ML IJ SOLN: 0.6250 mg | Freq: Once | INTRAMUSCULAR | Status: DC | PRN

## 2024-03-12 MED ORDER — ORAL CARE MOUTH RINSE
15.0000 mL | OROMUCOSAL | Status: DC | PRN
  Administered 2024-03-15: 15 mL via OROMUCOSAL

## 2024-03-12 MED ORDER — CLEVIDIPINE BUTYRATE 0.5 MG/ML IV EMUL
INTRAVENOUS | Status: AC
  Administered 2024-03-12: 2 mg/h via INTRAVENOUS
  Filled 2024-03-12: qty 50

## 2024-03-12 MED ORDER — ACETAMINOPHEN 325 MG PO TABS
650.0000 mg | ORAL_TABLET | ORAL | Status: DC | PRN
  Administered 2024-03-12 – 2024-03-15 (×8): 650 mg via ORAL
  Filled 2024-03-12 (×8): qty 2

## 2024-03-12 MED ORDER — FENTANYL CITRATE PF 50 MCG/ML IJ SOSY
25.0000 ug | PREFILLED_SYRINGE | INTRAMUSCULAR | Status: DC | PRN
  Administered 2024-03-18 – 2024-03-19 (×2): 25 ug via INTRAVENOUS
  Filled 2024-03-12 (×2): qty 1

## 2024-03-12 MED ORDER — DEXAMETHASONE SODIUM PHOSPHATE 10 MG/ML IJ SOLN
INTRAMUSCULAR | Status: DC | PRN
  Administered 2024-03-12: 10 mg via INTRAVENOUS

## 2024-03-12 MED ORDER — METOCLOPRAMIDE HCL 5 MG/ML IJ SOLN
5.0000 mg | Freq: Four times a day (QID) | INTRAMUSCULAR | Status: DC | PRN
  Administered 2024-03-19: 5 mg via INTRAVENOUS
  Filled 2024-03-12: qty 2

## 2024-03-12 MED ORDER — PANTOPRAZOLE SODIUM 40 MG IV SOLR
40.0000 mg | Freq: Every day | INTRAVENOUS | Status: DC
  Administered 2024-03-12: 40 mg via INTRAVENOUS
  Filled 2024-03-12: qty 10

## 2024-03-12 MED ORDER — DEXAMETHASONE SODIUM PHOSPHATE 10 MG/ML IJ SOLN
INTRAMUSCULAR | Status: AC
  Filled 2024-03-12: qty 1

## 2024-03-12 MED ORDER — ONDANSETRON HCL 4 MG/2ML IJ SOLN
INTRAMUSCULAR | Status: DC | PRN
  Administered 2024-03-12: 4 mg via INTRAVENOUS

## 2024-03-12 MED ORDER — CEFAZOLIN SODIUM-DEXTROSE 2-4 GM/100ML-% IV SOLN
2.0000 g | Freq: Three times a day (TID) | INTRAVENOUS | Status: AC
  Administered 2024-03-12 (×3): 2 g via INTRAVENOUS
  Filled 2024-03-12 (×3): qty 100

## 2024-03-12 MED ORDER — ONDANSETRON HCL 4 MG/2ML IJ SOLN
INTRAMUSCULAR | Status: AC
  Filled 2024-03-12: qty 2

## 2024-03-12 MED ORDER — ACETAMINOPHEN 10 MG/ML IV SOLN
1000.0000 mg | Freq: Once | INTRAVENOUS | Status: DC | PRN
Start: 2024-03-12 — End: 2024-03-12

## 2024-03-12 MED ORDER — HYDRALAZINE HCL 20 MG/ML IJ SOLN
10.0000 mg | INTRAMUSCULAR | Status: DC | PRN
  Administered 2024-03-12 – 2024-03-29 (×13): 10 mg via INTRAVENOUS
  Filled 2024-03-12 (×13): qty 1

## 2024-03-12 MED ORDER — LABETALOL HCL 5 MG/ML IV SOLN
10.0000 mg | INTRAVENOUS | Status: DC | PRN
  Administered 2024-03-12: 40 mg via INTRAVENOUS
  Administered 2024-03-12: 20 mg via INTRAVENOUS
  Administered 2024-03-13 – 2024-03-14 (×6): 40 mg via INTRAVENOUS
  Administered 2024-03-15: 20 mg via INTRAVENOUS
  Administered 2024-03-16: 10 mg via INTRAVENOUS
  Administered 2024-03-16 – 2024-03-18 (×9): 20 mg via INTRAVENOUS
  Administered 2024-03-19: 10 mg via INTRAVENOUS
  Administered 2024-03-19 – 2024-03-20 (×4): 20 mg via INTRAVENOUS
  Filled 2024-03-12: qty 8
  Filled 2024-03-12 (×3): qty 4
  Filled 2024-03-12: qty 8
  Filled 2024-03-12 (×3): qty 4
  Filled 2024-03-12: qty 8
  Filled 2024-03-12 (×2): qty 4
  Filled 2024-03-12 (×2): qty 8
  Filled 2024-03-12 (×3): qty 4
  Filled 2024-03-12: qty 8
  Filled 2024-03-12 (×4): qty 4
  Filled 2024-03-12: qty 8
  Filled 2024-03-12: qty 4

## 2024-03-12 MED ORDER — ACETAMINOPHEN 650 MG RE SUPP: 650.0000 mg | RECTAL | Status: DC | PRN

## 2024-03-12 MED ORDER — ORAL CARE MOUTH RINSE
15.0000 mL | OROMUCOSAL | Status: DC
  Administered 2024-03-12 – 2024-03-30 (×68): 15 mL via OROMUCOSAL

## 2024-03-12 MED ORDER — SODIUM CHLORIDE 0.9 % IV SOLN: INTRAVENOUS | Status: AC

## 2024-03-12 MED ORDER — CLEVIDIPINE BUTYRATE 0.5 MG/ML IV EMUL
0.0000 mg/h | INTRAVENOUS | Status: DC
  Administered 2024-03-12: 10 mg/h via INTRAVENOUS
  Administered 2024-03-12: 18 mg/h via INTRAVENOUS
  Administered 2024-03-12: 10 mg/h via INTRAVENOUS
  Administered 2024-03-12: 6 mg/h via INTRAVENOUS
  Administered 2024-03-12: 12 mg/h via INTRAVENOUS
  Administered 2024-03-13: 14 mg/h via INTRAVENOUS
  Administered 2024-03-13: 10 mg/h via INTRAVENOUS
  Administered 2024-03-13: 20 mg/h via INTRAVENOUS
  Administered 2024-03-13: 14 mg/h via INTRAVENOUS
  Administered 2024-03-13: 12 mg/h via INTRAVENOUS
  Administered 2024-03-14: 14 mg/h via INTRAVENOUS
  Administered 2024-03-14: 10 mg/h via INTRAVENOUS
  Administered 2024-03-14: 12 mg/h via INTRAVENOUS
  Administered 2024-03-14 (×2): 10 mg/h via INTRAVENOUS
  Administered 2024-03-15: 16 mg/h via INTRAVENOUS
  Administered 2024-03-15: 9 mg/h via INTRAVENOUS
  Administered 2024-03-15: 10 mg/h via INTRAVENOUS
  Filled 2024-03-12 (×18): qty 100

## 2024-03-12 MED ORDER — ONDANSETRON HCL 4 MG PO TABS: 4.0000 mg | ORAL_TABLET | ORAL | Status: DC | PRN

## 2024-03-12 MED ORDER — OXYCODONE HCL 5 MG PO TABS: 5.0000 mg | ORAL_TABLET | Freq: Once | ORAL | Status: DC | PRN

## 2024-03-12 NOTE — Progress Notes (Addendum)
 eLink Physician-Brief Progress Note Patient Name: Carsynn Bethune DOB: [birth year] MRN: [id number]   Date of Service  03/12/2024  HPI/Events of Note  73 y/o F w/ hx HTN who presented after GLF, found to have right convexity SDH and possible CCJ EDH. Stable on repeat imaging yet patient was exhibiting lethargy. Underwent R craniotomy for SDH evacuation on 8/31   Vital signs are within normal limits.  Saturating 90% on room air.  Results with adequate oxygenation and ventilation.  Some hyponatremia noted on preop ABG.  A.m. CT head pending.  eICU Interventions  Interval imaging per neurosurgery  Strict blood pressure control with systolic goal less than 140.  Metabet elevation, standard neuroprotective measures.  Home antihypertensives have been ordered.  DVT prophylaxis with SCDs GI prophylaxis not indicated   0340 -continue Foley catheter, placed in OR  Intervention Category Evaluation Type: New Patient Evaluation  Jimy Gates 03/12/2024, 2:17 AM

## 2024-03-12 NOTE — Consult Note (Signed)
 NAME:  Colleen [name], MRN:  [id number], DOB:  06-Dec-1950, LOS: 0 ADMISSION DATE:  03/09/2024, CONSULTATION DATE:  03/12/24  REFERRING MD:  MD Malcolm CHIEF COMPLAINT:  Subdural Hematoma   History of Present Illness:  Pt is a 73 yr old female with significant pmhx of HTN, Asthma, GERD, lethargy/catanoia anxiety, and depression who presented to [facility] post fall with complaints of headache, neck pain, and nausea on 03/09/24. CT scans of maxillofacial, head, C-Spine were obtained revealed, right scalp/periorbital hematoma, acute subdural hemorrhage along the right cerebral convexity, measuring up to 8 mm in thickness with mass effect and with 8 mm leftward midline shift, acute subdural hemorrhage also present along the right aspect of the falx, measuring up to 5 mm in thickness, and a Epidural hematoma at the craniocervical junction and within the cervical spinal canal, 4mm. Patient transferred to [facility] for further neuro evaluation and MRI of cervical spine. MRI showing small dorsal epidural hematoma located between CI vertebrae and occiput.  On 8/31, patient having worsening neurostatus becoming more lethargic. Patient was given 6mg  of ativan  for history catatonia/lethargic with no improvement subsequently patient taken for a craniotomy for subdural hematoma evacuation.   Pertinent  Medical History   Past Medical History:  Diagnosis Date   Allergy    Anxiety    Arrhythmia    does not see cardiologist, sees pcp, Dr. 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    Heartburn    Herpes    on medication for  outbreaks only   Hypertension    Seizures (HCC)    MEDICATION INDUCED   Urinary tract infection    hx of     Significant Hospital Events: Including procedures, antibiotic start and stop dates in addition to other pertinent events   8/29 admit by neurosurg for right SDH and Cervical Spine Hematoma   Interim History / Subjective:  S/P Crani Right Surgical Incision- JP drain to suction, subdural drain to gravity  Bloody output  Follows commands intermittently  Drowsy Weaker on left side than right   Objective    Blood pressure 134/81, pulse 92, temperature 97.8 F (36.6 C), resp. rate 19, height 5' 4.02 (1.626 m), weight 65 kg, SpO2 97%.        Intake/Output Summary (Last 24 hours) at 03/12/2024 0205 Last data filed at 03/12/2024 0111 Gross per 24 hour  Intake 2690 ml  Output 550 ml  Net 2140 ml   Filed Weights   03/09/24 2030  Weight: 65 kg    Examination: General: acute on chronic older female, sitting up in ICU bed s/p crani, no distress  HENT: Normocephalic,  Lungs: clear, diminished, on 3 L [place]  Cardiovascular: s1,s2, NSR, no JVD, no MRG, artline- SBP 160s  Abdomen: bs, soft  Extremities: weaker on right than left  Neuro: drowsy, follows commands intermittently, knows self- disoriented x 3  GU: foley intact- yellow urine   Resolved problem list   Assessment and Plan  Right Craniotomy for SDH Evacuation - 8/31  Hx of Lethargy/Catatonia  8/29 CT of maxillofacial, head, and C-spine- acute subdural hemorrhage along the right cerebral convexity, measuring up to 8 mm in thickness with mass effect and with 8 mm leftward midline shift, acute subdural hemorrhage also present along the right aspect of the falx, measuring up to 5 mm in thickness, and a Epidural hematoma at the craniocervical junction and within the cervical spinal canal, 4mm. MRI showing small dorsal epidural hematoma located between CI vertebrae and occiput. 8/31 Worsening neuro status  P:  Neurosurgy  primary, appreciate assistance Frequent Neurochecks  Subdural drain to gravity, JP drain to suction  SBP goal < 140  Start Cleviprex   Holding anticoag per neurosurg recs  Repeat CT imaging in AM on 9/1 Continue ancef  for surgical prophylaxis  Continue Keppra  BID  Continue Seizure precautions Continue fall precautions   Continue Neuroproctective measures - euglycemia, normothermia, euvolemia, maintain electrolytes within normal limits  Place ETCO2   Hyponatremia  Na 132  P: Repeat on morning BMET   HTN  P:  SBP goal < 140 as above Start Cleviprex  gtt  Labetalol , and hydralazine  prn  GERD P: Protonix  IV   Anxiety  Depression  P: Supportive care  Continue Effexor  and Lamictal     Labs   CBC: Recent Labs  Lab 03/09/24 1717 03/11/24 2338 03/12/24 0114  WBC 6.8  --  7.1  NEUTROABS 5.2  --   --   HGB 11.6* 7.5* 9.7*  HCT 32.2* 22.0* 27.4*  MCV 91.5  --  93.2  PLT 204  --  176    Basic Metabolic Panel: Recent Labs  Lab 03/09/24 1717 03/11/24 2338  NA 132* 125*  K 4.5 3.1*  CL 98  --   CO2 23  --   GLUCOSE 100*  --   BUN 9  --   CREATININE 0.87  --   CALCIUM  9.3  --    GFR: Estimated Creatinine Clearance: 50.5 mL/min (by C-G formula based on SCr of 0.87 mg/dL). Recent Labs  Lab 03/09/24 1717 03/12/24 0114  WBC 6.8 7.1    Liver Function Tests: No results for input(s): AST, ALT, ALKPHOS, BILITOT, PROT, ALBUMIN in the last 168 hours. No results for input(s): LIPASE, AMYLASE in the last 168 hours. No results for input(s): AMMONIA in the last 168 hours.  ABG    Component Value Date/Time   PHART 7.333 (L) 03/11/2024 2338   PCO2ART 36.2 03/11/2024 2338   PO2ART 220 (H) 03/11/2024 2338   HCO3 19.6 (L) 03/11/2024 2338   TCO2 21 (L) 03/11/2024 2338   ACIDBASEDEF 6.0 (H) 03/11/2024 2338   O2SAT 100 03/11/2024 2338     Coagulation Profile: No results for input(s): INR, PROTIME in the last 168 hours.  Cardiac Enzymes: No  results for input(s): CKTOTAL, CKMB, CKMBINDEX, TROPONINI in the last 168 hours.  HbA1C: Hgb A1c MFr Bld  Date/Time Value Ref Range Status  10/17/2021 06:21 AM 5.1 4.8 - 5.6 % Final    Comment:    (NOTE) Pre diabetes:          5.7%-6.4%  Diabetes:              >6.4%  Glycemic control for   <7.0% adults with diabetes   09/09/2021 04:44 PM 4.8 4.8 - 5.6 % Final    Comment:    (NOTE) Pre diabetes:          5.7%-6.4%  Diabetes:              >6.4%  Glycemic control  for   <7.0% adults with diabetes     CBG: No results for input(s): GLUCAP in the last 168 hours.  Review of Systems:   See HPI   Past Medical History:  She,  has a past medical history of Allergy, Anxiety, Arrhythmia, Asthma, Bronchitis, Depression, Family history of anesthesia complication, GERD (gastroesophageal reflux disease), Goiter (03/17/2012), Heartburn, Herpes, Hypertension, Seizures (HCC), and Urinary tract infection.   Surgical History:   Past Surgical History:  Procedure Laterality Date   CESAREAN SECTION     x 2   COLONOSCOPY     ELBOW SURGERY     right   KNEE ARTHROSCOPY     right   PILONIDAL CYST EXCISION     spinal cystectomy     where pilonidal cyst was   THYROIDECTOMY  04/07/2012   Procedure: THYROIDECTOMY;  Surgeon: Sherlean JINNY Laughter, MD;  Location: [facility] OR;  Service: General;  Laterality: N/A;        Social History:   reports that she quit smoking about 24 years ago. Her smoking use included cigarettes. She has never used smokeless tobacco. She reports that she does not drink alcohol and does not use drugs.   Family History:  Her family history includes Atrial fibrillation in her sister; Cervical cancer in her mother; Cirrhosis in her brother; Fibromyalgia in her sister; Heart disease in her father; Heart failure in her father; Hypertension in her brother and father; Irritable bowel syndrome in her brother; Prostate cancer in her brother; Seizures in her brother and father.  There is no history of Breast cancer.   Allergies Allergies  Allergen Reactions   Amlodipine  Other (See Comments)    Flushing, tingling in face, sore throat  (Norvasc )   Cyclobenzaprine Hcl Other (See Comments)    Seizures    Aripiprazole Other (See Comments)   Cymbalta  [Duloxetine  Hcl] Other (See Comments)    Made the depression worse   Tape Rash    Blisters     Home Medications  Prior to Admission medications   Medication Sig Start Date End Date Taking? Authorizing Provider  acyclovir  (ZOVIRAX ) 400 MG tablet Take 400 mg by mouth 3 (three) times daily as needed (Flare ups). 01/09/24  Yes [provider]  busPIRone  (BUSPAR ) 7.5 MG tablet Take 7.5 mg by mouth 2 (two) times daily.   Yes [provider]  lamoTRIgine  (LAMICTAL ) 150 MG tablet Take 150 mg by mouth daily.   Yes [provider]  levothyroxine  (SYNTHROID ) 100 MCG tablet Take 1 tablet (100 mcg total) by mouth daily. 10/10/21  Yes Von Bellis, MD  metoprolol  succinate (TOPROL -XL) 25 MG 24 hr tablet Take 25 mg by mouth daily.   Yes [provider]  olmesartan (BENICAR) 20 MG tablet Take 20 mg by mouth daily. 01/07/24  Yes [provider]  risperiDONE  (RISPERDAL ) 3 MG tablet Take 3 mg by mouth at bedtime. 03/06/24  Yes [provider]  traZODone  (DESYREL ) 50 MG tablet Take 50 mg by mouth at bedtime. 03/06/24  Yes [provider]  venlafaxine  XR (EFFEXOR -XR) 150 MG 24 hr capsule Take 300 mg by mouth daily.   Yes [provider]     Critical care time: 55 mins     Christian Samhita Kretsch AGACNP-BC   [facility] 03/12/2024, 2:05 AM  Please see [email or web address] for pager details.  From 7A-7P if no response, please call [phone]. After hours, please call ELink [phone].

## 2024-03-12 NOTE — Progress Notes (Signed)
 Received patient from OR with Dr. Erma present. On arrival, patient is responsive to pain, is not verbal, and not responding to commands. Dr. Erma stated that due to patient's pre-op neurological status and history of catatonia episodes, patient could be taken to ICU in current neurological state as long as vitals remain stable. Dr. Treen gave order for STAT CBC to be drawn from [facility]. CRNA Zebedee said that H/H in OR hemoglobin was 7.5 and a type and screen was sent to blood bank; however, blood bank called her back and said patient has antibodies and blood will take some time. Dr. Erma said there is no need for patient to stay in PACU to wait for blood as she is hemodynamically stable. Dr. Treen also said that the Aline had a whip and to go by cuff pressure for BP parameters.  In my neuro assessments while in PACU, patient remained non-verbal and would not follow commands. She would respond to pain by flinching, either her fingers or feet or scrunching her brow. She had non-purposeful movement bilaterally in that her feet would randomly flex up and down and sometimes her knees would bend. Oral care was performed and patient jaw would relax for us  to do so. Her hands remained in a squeeze, but would allow us  to unclasp her fingers and also would not grip on command. Throughout PACU stay, vital signs remained stable. I gave report to Dagoberto, RN who was present at bedside handoff.

## 2024-03-12 NOTE — TOC CAGE-AID Note (Signed)
 Transition of Care [facility]) - CAGE-AID Screening   Patient Details  Name: Colleen [name] MRN: [id number] Date of Birth: 02/02/1951  Transition of Care [facility]) CM/SW Contact:    Ranbir Chew E Twisha Vanpelt, LCSW Phone Number: 03/12/2024, 11:45 AM   Clinical Narrative: No SA noted.   CAGE-AID Screening:    Have You Ever Felt You Ought to Cut Down on Your Drinking or Drug Use?: No Have People Annoyed You By Critizing Your Drinking Or Drug Use?: No Have You Felt Bad Or Guilty About Your Drinking Or Drug Use?: No Have You Ever Had a Drink or Used Drugs First Thing In The Morning to Steady Your Nerves or to Get Rid of a Hangover?: No CAGE-AID Score: 0  Substance Abuse Education Offered: No

## 2024-03-12 NOTE — Anesthesia Postprocedure Evaluation (Signed)
 Anesthesia Post Note  Patient: Colleen [name]  Procedure(s) Performed: CRANIOTOMY HEMATOMA EVACUATION SUBDURAL (Right: Head)     Patient location during evaluation: PACU Anesthesia Type: General Level of consciousness: awake Pain management: pain level controlled Vital Signs Assessment: post-procedure vital signs reviewed and stable Respiratory status: spontaneous breathing, nonlabored ventilation, respiratory function stable and patient connected to nasal cannula oxygen Cardiovascular status: blood pressure returned to baseline and stable Postop Assessment: no apparent nausea or vomiting Anesthetic complications: no   No notable events documented.  Last Vitals:  Vitals:   03/12/24 0615 03/12/24 0630  BP: 124/62 121/66  Pulse: 84 79  Resp: 12 16  Temp:    SpO2: 99% 97%    Last Pain:  Vitals:   03/12/24 0400  TempSrc: Axillary  PainSc:                  Thom JONELLE Peoples

## 2024-03-12 NOTE — Progress Notes (Signed)
 0730 patient follows commands mute and does not open eyes on IV meds for BP  1130 patient opens eyes able to speak clear states 1925 for year otherwise alert and orientated. Patient states she had trouble seeing at home and had a fall. She also states she had trouble with seeing on 4N PCU prior to falling. Daughter at bedside papers copied from past medical discharges and patients neuro condition just incase it can't be accessed in my chart from other hospitals.

## 2024-03-12 NOTE — Progress Notes (Signed)
 Assessment 73 y/o F w/ hx HTN who presented after GLF, found to have right convexity SDH and possible CCJ EDH. Stable on repeat imaging yet patient was exhibiting lethargy. Underwent R craniotomy for SDH evacuation on 8/31  LOS: 0 days    Plan: CT reviewed - significant improvement Subdural drain to gravity - 225cc. Keep today JP drain to bulb suction - 10cc. Keep today SBP<140 AAT DAT No collar or intervention for CCJ findings Hold all anticoagulation and antiplatelets  Subjective: Overnight pt improved neurologically  Objective: Vital signs in last 24 hours: Temp:  [97.5 F (36.4 C)-98.3 F (36.8 C)] 97.8 F (36.6 C) (09/01 0731) Pulse Rate:  [70-98] 84 (09/01 0731) Resp:  [12-23] 22 (09/01 0731) BP: (101-162)/(55-92) 101/65 (09/01 0731) SpO2:  [90 %-100 %] 99 % (09/01 0731) Arterial Line BP: (112-176)/(48-81) 133/53 (09/01 0731) Weight:  [63.2 kg] 63.2 kg (09/01 0238)  Intake/Output from previous day: 08/31 0701 - 09/01 0700 In: 2835.3 [I.V.:2680.3; IV Piggyback:155] Out: 1385 [Urine:900; Drains:235; Blood:250] Intake/Output this shift: No intake/output data recorded.  Exam: GCS 3E 3V 23M Stated name and mumbled Conjugate gaze, PERRL Grimace symmetric Moves all 4 extremities spontaneously. Wiggles toes and waves hands to command b/l Cranial incision c/d/I, staples Drains present  Lab Results: Recent Labs    03/12/24 0114 03/12/24 0419  WBC 7.1 8.3  HGB 9.7* 10.0*  HCT 27.4* 28.0*  PLT 176 178   BMET Recent Labs    03/09/24 1717 03/11/24 2338 03/12/24 0419  NA 132* 125* 128*  K 4.5 3.1* 3.4*  CL 98  --  99  CO2 23  --  21*  GLUCOSE 100*  --  137*  BUN 9  --  9  CREATININE 0.87  --  0.65  CALCIUM  9.3  --  8.0*    Studies/Results: CT HEAD WO CONTRAST Result Date: 03/12/2024 CLINICAL DATA:  73 year old female status post fall with right side subdural hematoma, postoperative day 0 or day 1 status post craniotomy and hematoma evacuation. EXAM: CT  HEAD WITHOUT CONTRAST TECHNIQUE: Contiguous axial images were obtained from the base of the skull through the vertex without intravenous contrast. RADIATION DOSE REDUCTION: This exam was performed according to the departmental dose-optimization program which includes automated exposure control, adjustment of the mA and/or kV according to patient size and/or use of iterative reconstruction technique. COMPARISON:  1748 hours yesterday. FINDINGS: Brain: Interval postoperative changes with right subdural drain in place. Small volume pneumocephalus. Regressed mixed density right side subdural hematoma. Mostly hyperdense residual now is up to 4-5 mm thickness on coronal images (versus between 7 and 10 mm on coronal images yesterday). Trace para falcine hyperdense blood also noted, not significantly changed. Decreased mass effect on the right hemisphere. Leftward midline shift now 6 mm, versus up to 11 mm yesterday. No ventriculomegaly. Basilar cistern patency has improved, especially the suprasellar cistern on sagittal image 31. No new intracranial hemorrhage. Stable gray-white matter differentiation throughout the brain. No cortically based acute infarct identified. Vascular: No suspicious intracranial vascular hyperdensity. Skull: New right side craniotomy. Sinuses/Orbits: Visualized paranasal sinuses and mastoids are stable and well aerated. Other: New postoperative changes to the right scalp. Superficial and percutaneous drains in place. Overlying skin staples. Superimposed right forehead and supraorbital scalp hematoma. Orbits soft tissues otherwise appear negative. IMPRESSION: 1. Postoperative decreased right side subdural hematoma, residual is 4-5 mm thickness on coronal images. Decreased intracranial mass effect, leftward midline shift now up to 6 mm, and substantially improved basilar cistern patency. 2.  No new intracranial abnormality. Electronically Signed   By: VEAR Hurst M.D.   On: 03/12/2024 05:39   CT HEAD  WO CONTRAST ( ) Result Date: 03/11/2024 EXAM: CT HEAD WITHOUT CONTRAST 03/11/2024 05:55:09 PM TECHNIQUE: CT of the head was performed without the administration of intravenous contrast. Automated exposure control, iterative reconstruction, and/or weight based adjustment of the mA/kV was utilized to reduce the radiation dose to as low as reasonably achievable. COMPARISON: CT of the head without contrast 03/10/2024. CLINICAL HISTORY: Head trauma, abnormal mental status (Age 69-64y); Fall. Pt fell in bathroom, in hospital room. Possible seizure. FINDINGS: BRAIN AND VENTRICLES: The mixed density subdural collection over the right convexity is stable in size, measuring approximately 6 mm. Mass effect and midline shift are stable to slightly increased, now measuring 10 mm. Minimal blood is again noted along the anterior cerebral falx. No new hemorrhage is present. ORBITS: No acute abnormality. SINUSES: No acute abnormality. SOFT TISSUES AND SKULL: No acute soft tissue abnormality. No skull fracture. IMPRESSION: 1. Stable mixed density subdural collection over the right convexity measuring approximately 6 mm. 2. Stable to slightly increased mass effect and midline shift, now measuring 10 mm. 3. Minimal blood along the anterior cerebral falx. No new hemorrhage. Electronically signed by: Lonni Necessary MD 03/11/2024 06:13 PM EDT RP Workstation: [id number]   CT HEAD WO CONTRAST ( ) Result Date: 03/10/2024 EXAM: CT HEAD WITHOUT CONTRAST 03/10/2024 01:49:43 PM TECHNIQUE: CT of the head was performed without the administration of intravenous contrast. Automated exposure control, iterative reconstruction, and/or weight based adjustment of the mA/kV was utilized to reduce the radiation dose to as low as reasonably achievable. COMPARISON: CT head without contrast 03/09/2024. CLINICAL HISTORY: Subdural hematoma. Chief complaints; Fall. FINDINGS: BRAIN AND VENTRICLES: American convexity subdural hematoma, stable,  measuring 5 mm maximally on coronal images. Partial effacement of the sulci was again noted. Midline shift measures up to 9 mm, stable. Minimal blood along the falx, stable. No new hemorrhage is present. ORBITS: No acute abnormality. SINUSES: No acute abnormality. SOFT TISSUES AND SKULL: No underlying fracture or foreign body is present. IMPRESSION: 1. Stable right convexity subdural hematoma measuring 5 mm maximally on coronal images, with associated partial effacement of the sulci and stable midline shift up to 9 mm. 2. No new hemorrhage. Electronically signed by: Lonni Necessary MD 03/10/2024 02:02 PM EDT RP Workstation: [id number]      Dorn JONELLE Glade 03/12/2024, 7:48 AM

## 2024-03-12 NOTE — Evaluation (Signed)
 Clinical/Bedside Swallow Evaluation Patient Details  Name: Colleen [name] MRN: [id number] Date of Birth: 01-20-1951  Today's Date: 03/12/2024 Time: SLP Start Time (ACUTE ONLY): 1024 SLP Stop Time (ACUTE ONLY): 1038 SLP Time Calculation (min) (ACUTE ONLY): 14 min  Past Medical History:  Past Medical History:  Diagnosis Date   Allergy    Anxiety    Arrhythmia    does not see cardiologist, sees pcp, Dr. 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    Heartburn    Herpes    on medication for outbreaks only   Hypertension    Seizures (HCC)    MEDICATION INDUCED   Urinary tract infection    hx of   Past Surgical History:  Past Surgical History:  Procedure Laterality Date   CESAREAN SECTION     x 2   COLONOSCOPY     ELBOW SURGERY     right   KNEE ARTHROSCOPY     right   PILONIDAL CYST EXCISION     spinal cystectomy     where pilonidal cyst was   THYROIDECTOMY  04/07/2012   Procedure: THYROIDECTOMY;  Surgeon: Sherlean JINNY Laughter, MD;  Location: [facility] OR;  Service: General;  Laterality: N/A;      HPI:  Colleen [name] is a 73 yo female presenting to ED 8/29 after a fall with headache, neck pain, and nausea. CT shows R scalp/periorbital hematoma, acute SDH along the R cerebral convexity measuring 8 mm thickness with mass effect and 8 mm leftward midline shift as well as an acute SDH along the R aspect of the falx measuring up to 5 mm in thickness, and an epidural hematoma at the craniocervical junction. MRI shows small dorsal epidural hematoma between C1 vertebrae and occiput. Worsening neurostatus  8/31, taken for R craniotomy for subdural hematoma evacuation. PMH includes HTN, asthma, GERD, anxiety/depression, thyroid  garter s/p total thyroidectomy 2013    Assessment / Plan / Recommendation  Clinical Impression  Pt presents with lethargy and requires extensive cueing to open her eyes to attend to POs. She follows commands given increased time, though this has been inconsistent per RN. SLP provided oral care followed by trials of ice chips, thin liquids, and purees. While there are no overt s/s of aspiration, pt is not consistently attentive to POs. Recommend she remain NPO with the exception of ice chips in moderation after oral care and meds crushed in puree. SLP will f/u as mentation allows for ongoing assessment. SLP Visit Diagnosis: Dysphagia, unspecified (R13.10)    Aspiration Risk  Mild aspiration risk    Diet Recommendation NPO except meds;Ice chips PRN after oral care    Medication Administration: Crushed with puree    Other  Recommendations Oral Care Recommendations: Oral care QID;Oral care prior to ice chip/H20     Assistance Recommended at Discharge    Functional Status Assessment Patient has had a recent decline in their functional status and demonstrates the ability to make significant improvements in function in a reasonable and predictable amount of time.  Frequency and Duration min  2x/week  2 weeks       Prognosis Prognosis for improved oropharyngeal function: Good Barriers to Reach Goals: Cognitive deficits      Swallow Study   General HPI: Colleen [name] is a 73 yo female presenting to ED 8/29 after a fall with headache, neck pain, and nausea. CT shows R scalp/periorbital hematoma, acute SDH along the R cerebral convexity measuring 8 mm thickness with mass effect and 8 mm leftward midline shift as well as an acute SDH along the R aspect of the falx measuring up to 5 mm in thickness, and an epidural hematoma at the craniocervical junction. MRI shows small dorsal  epidural hematoma between C1 vertebrae and occiput. Worsening neurostatus 8/31, taken for R craniotomy for subdural hematoma evacuation. PMH includes HTN, asthma, GERD, anxiety/depression, thyroid  garter s/p total thyroidectomy 2013 Type of Study: Bedside Swallow Evaluation Previous Swallow Assessment: none in chart Diet Prior to this Study: NPO Temperature Spikes Noted: No Respiratory Status: Nasal cannula History of Recent Intubation: No Behavior/Cognition: Lethargic/Drowsy;Requires cueing Oral Cavity Assessment: Within Functional Limits Oral Care Completed by SLP: Yes Oral Cavity - Dentition: Adequate natural dentition Vision: Functional for self-feeding Self-Feeding Abilities: Total assist Patient Positioning: Upright in bed Baseline Vocal Quality: Hoarse Volitional Cough: Cognitively unable to elicit Volitional Swallow: Able to elicit    Oral/Motor/Sensory Function Overall Oral Motor/Sensory Function: Within functional limits   Ice Chips Ice chips: Within functional limits Presentation: Spoon   Thin Liquid Thin Liquid: Within functional limits Presentation: Spoon    Nectar Thick Nectar Thick Liquid: Not tested   Honey Thick Honey Thick Liquid: Not tested   Puree Puree: Within functional limits Presentation: Spoon   Solid     Solid: Not tested      Damien Blumenthal, M.A., CCC-SLP Speech Language Pathology, Acute Rehabilitation Services  Secure Chat preferred [phone]  03/12/2024,11:40 AM

## 2024-03-12 NOTE — Progress Notes (Signed)
 eLink Physician-Brief Progress Note Patient Name: Colleen [name] DOB: 1951-01-10 MRN: [id number]   Date of Service  03/12/2024  HPI/Events of Note  cleared by speech earlier for sips with meds and ice chips but there is no order to reflect this  UOP is marginal at this time. Maybe on dayshift out foley and maybe <197mL so far on night shift  eICU Interventions  NPO except meds/ice chips. No changes to fluids.   [id number] - kcl  Intervention Category Minor Interventions: Routine modifications to care plan (e.g. PRN medications for pain, fever)  Colleen [name] 03/12/2024, 11:33 PM

## 2024-03-12 NOTE — Plan of Care (Signed)
   Problem: Clinical Measurements: Goal: Ability to maintain clinical measurements within normal limits will improve Outcome: Progressing   Problem: Pain Managment: Goal: General experience of comfort will improve and/or be controlled Outcome: Progressing

## 2024-03-12 NOTE — Transfer of Care (Signed)
 Immediate Anesthesia Transfer of Care Note  Patient: Colleen [name]  Procedure(s) Performed: CRANIOTOMY HEMATOMA EVACUATION SUBDURAL (Right: Head)  Patient Location: PACU  Anesthesia Type:General  Level of Consciousness: unresponsive  Airway & Oxygen Therapy: Patient connected to face mask oxygen  Post-op Assessment: Report given to RN and Post -op Vital signs reviewed and stable  Post vital signs: Reviewed and stable  Last Vitals:  Vitals Value Taken Time  BP 113/73 03/12/24 01:02  Temp 97.7 oral   Pulse 93 03/12/24 01:13  Resp 15 03/12/24 01:13  SpO2 100 % 03/12/24 01:13  Vitals shown include unfiled device data.  Last Pain:  Vitals:   03/11/24 1939  TempSrc: Oral  PainSc:       Patients Stated Pain Goal: 0 (03/10/24 2020)  Complications: No notable events documented.

## 2024-03-12 NOTE — Plan of Care (Signed)
  Problem: Education: Goal: Knowledge of General Education information will improve Description: Including pain rating scale, medication(s)/side effects and non-pharmacologic comfort measures Outcome: Not Progressing   Problem: Health Behavior/Discharge Planning: Goal: Ability to manage health-related needs will improve Outcome: Not Progressing   Problem: Clinical Measurements: Goal: Ability to maintain clinical measurements within normal limits will improve Outcome: Not Progressing Goal: Will remain free from infection Outcome: Not Progressing Goal: Diagnostic test results will improve Outcome: Not Progressing Goal: Respiratory complications will improve Outcome: Not Progressing Goal: Cardiovascular complication will be avoided Outcome: Not Progressing   Problem: Activity: Goal: Risk for activity intolerance will decrease Outcome: Not Progressing   Problem: Nutrition: Goal: Adequate nutrition will be maintained Outcome: Not Progressing   Problem: Coping: Goal: Level of anxiety will decrease Outcome: Not Progressing   Problem: Elimination: Goal: Will not experience complications related to bowel motility Outcome: Not Progressing Goal: Will not experience complications related to urinary retention Outcome: Not Progressing   Problem: Pain Managment: Goal: General experience of comfort will improve and/or be controlled Outcome: Progressing   Problem: Safety: Goal: Ability to remain free from injury will improve Outcome: Not Progressing   Problem: Skin Integrity: Goal: Risk for impaired skin integrity will decrease Outcome: Not Progressing   Problem: Education: Goal: Knowledge of the prescribed therapeutic regimen will improve Outcome: Not Progressing   Problem: Clinical Measurements: Goal: Usual level of consciousness will be regained or maintained. Outcome: Not Progressing Goal: Neurologic status will improve Outcome: Not Progressing Goal: Ability to maintain  intracranial pressure will improve Outcome: Not Progressing   Problem: Skin Integrity: Goal: Demonstration of wound healing without infection will improve Outcome: Not Progressing

## 2024-03-12 NOTE — Op Note (Signed)
 Procedure(s): RIGHT CRANIOTOMY HEMATOMA EVACUATION SUBDURAL Procedure Note  Procedure(s) and Anesthesia Type:    * RIGHT CRANIOTOMY HEMATOMA EVACUATION SUBDURAL - General  Surgeon: Dorn JONELLE Glade   Assistants: none  Anesthesia: General endotracheal anesthesia  Indications: This is a 73 y/o F who suffered a GLF, found to have large right convexity aSDH. Patient was observed neurologically but ultimately suffered a neurologic decline prompting craniotomy.  Risks, benefits, alternatives, and expected convalescence were discussed with her daughter.  Risks discussed included but were not limited to bleeding, pain, infection, seizure, stroke, scar, subdural recurrence, neurologic deficit, coma, and death.  Informed consent was obtained.  Procedure in detail:  The patient was brought to the operating room.  A timeout was performed.  General anesthesia was induced and patient was intubated by the anesthesia service.  After appropriate lines and monitors were placed, patient's head was turned to the left and scalp was shaved, preprepped with alcohol and cleansing solution and prepped and draped in sterile fashion. Preoperative antibiotics were administered.  Local anesthetic with epinephrine  injected into the planned incision.  A question mark shaped incision was made with a 10 blade and monopolar electrocautery was used to open the galea and incised the temporalis fascia.  The periosteum and temporalis muscle was dissected off the skull and the myocutaneous flap was flapped forward and held in place with fishhooks. The sagittal suture was identified prior to bone work.  Bur holes were placed near the coronal suture, keyhole, squamosal temporal bone, and parietal bone. Curettes and penfield dissectors were used to dissect the dura from the inner table of the skull.  Craniotome was used to perform a craniotomy.  Meticulous epidural hemostasis obtained.  The middle meningeal artery was identified and  coagulated to help reduce risk of recurrence.  The dura was then opened in C-shaped manner and flapped forward.   Upon opening the dura, there was a large subdural hematoma. This was cleared with penfield dissectors, irrigation, and suction. Subdural space was irrigated thoroughly to ensure no active bleeding and the irrigation returned clear.  An EVD catheter was placed in the subdural space, tunneled through the skin, and secured with a stitch. The dura was closed with 4-0 Nurolon in an interrupted fashion.  The bone flap was replaced and secured with a cranial plating system.  The wound is irrigated thoroughly with bacitracin  impregnated irrigation.  A JP drain was placed in subgaleal space, tunneled through the skin, and secured with a stitch.  Temporalis muscle was reapproximated with 2-0 Vicryl stitches.  The galea was closed with 2-0 Vicryl sutures in buried fashion.  Skin was closed with staples. Antibiotic ointment was then placed on the incision. All counts were correct at the end of surgery.  No complications were noted.  Findings: acute subdural hematoma  Estimated Blood Loss:  400cc         Drains: subgaleal JP drain, subdural drain        Specimens: none         Implants: Cranial plating        Complications:  * No complications entered in OR log *         Disposition: ICU         Condition: stable

## 2024-03-12 NOTE — Progress Notes (Signed)
 Assessment 73 y/o F w/ hx HTN who presented after GLF, found to have right convexity SDH and possible CCJ EDH. Stable on repeat imaging yet patient was exhibiting lethargy. Underwent R craniotomy for SDH evacuation on 8/31  LOS: 0 days    Plan: CT in AM Subdural drain to gravity JP drain to bulb suction SBP<140 AAT DAT No collar or intervention for CCJ findings Hold all anticoagulation and antiplatelets  Subjective: Pt was at her preop exam  Objective: Vital signs in last 24 hours: Temp:  [97.7 F (36.5 C)-98.3 F (36.8 C)] 97.8 F (36.6 C) (09/01 0150) Pulse Rate:  [70-98] 92 (09/01 0150) Resp:  [14-21] 19 (09/01 0150) BP: (113-162)/(69-83) 134/81 (09/01 0150) SpO2:  [90 %-100 %] 97 % (09/01 0150)  Intake/Output from previous day: 08/31 0701 - 09/01 0700 In: 2450 [I.V.:2400; IV Piggyback:50] Out: 550 [Urine:300; Blood:250] Intake/Output this shift: Total I/O In: 2450 [I.V.:2400; IV Piggyback:50] Out: 550 [Urine:300; Blood:250]  Exam: GCS 1E 1V 62M Conjugate gaze, PERRL Grimace symmetric Moves all 4 extremities spontaneously Cranial incision c/d/I, staples Drains present  Lab Results: Recent Labs    03/09/24 1717 03/11/24 2338 03/12/24 0114  WBC 6.8  --  7.1  HGB 11.6* 7.5* 9.7*  HCT 32.2* 22.0* 27.4*  PLT 204  --  176   BMET Recent Labs    03/09/24 1717 03/11/24 2338  NA 132* 125*  K 4.5 3.1*  CL 98  --   CO2 23  --   GLUCOSE 100*  --   BUN 9  --   CREATININE 0.87  --   CALCIUM  9.3  --     Studies/Results: CT HEAD WO CONTRAST ( ) Result Date: 03/11/2024 EXAM: CT HEAD WITHOUT CONTRAST 03/11/2024 05:55:09 PM TECHNIQUE: CT of the head was performed without the administration of intravenous contrast. Automated exposure control, iterative reconstruction, and/or weight based adjustment of the mA/kV was utilized to reduce the radiation dose to as low as reasonably achievable. COMPARISON: CT of the head without contrast 03/10/2024. CLINICAL HISTORY:  Head trauma, abnormal mental status (Age 72-72y); Fall. Pt fell in bathroom, in hospital room. Possible seizure. FINDINGS: BRAIN AND VENTRICLES: The mixed density subdural collection over the right convexity is stable in size, measuring approximately 6 mm. Mass effect and midline shift are stable to slightly increased, now measuring 10 mm. Minimal blood is again noted along the anterior cerebral falx. No new hemorrhage is present. ORBITS: No acute abnormality. SINUSES: No acute abnormality. SOFT TISSUES AND SKULL: No acute soft tissue abnormality. No skull fracture. IMPRESSION: 1. Stable mixed density subdural collection over the right convexity measuring approximately 6 mm. 2. Stable to slightly increased mass effect and midline shift, now measuring 10 mm. 3. Minimal blood along the anterior cerebral falx. No new hemorrhage. Electronically signed by: Lonni Necessary MD 03/11/2024 06:13 PM EDT RP Workstation: [id number]   CT HEAD WO CONTRAST ( ) Result Date: 03/10/2024 EXAM: CT HEAD WITHOUT CONTRAST 03/10/2024 01:49:43 PM TECHNIQUE: CT of the head was performed without the administration of intravenous contrast. Automated exposure control, iterative reconstruction, and/or weight based adjustment of the mA/kV was utilized to reduce the radiation dose to as low as reasonably achievable. COMPARISON: CT head without contrast 03/09/2024. CLINICAL HISTORY: Subdural hematoma. Chief complaints; Fall. FINDINGS: BRAIN AND VENTRICLES: American convexity subdural hematoma, stable, measuring 5 mm maximally on coronal images. Partial effacement of the sulci was again noted. Midline shift measures up to 9 mm, stable. Minimal blood along the falx, stable. No new hemorrhage is  present. ORBITS: No acute abnormality. SINUSES: No acute abnormality. SOFT TISSUES AND SKULL: No underlying fracture or foreign body is present. IMPRESSION: 1. Stable right convexity subdural hematoma measuring 5 mm maximally on coronal images, with  associated partial effacement of the sulci and stable midline shift up to 9 mm. 2. No new hemorrhage. Electronically signed by: Lonni Necessary MD 03/10/2024 02:02 PM EDT RP Workstation: [id number]      Colleen [name] Colleen [name] 03/12/2024, 2:09 AM

## 2024-03-13 ENCOUNTER — Encounter (HOSPITAL_COMMUNITY): Payer: Self-pay

## 2024-03-13 ENCOUNTER — Inpatient Hospital Stay (HOSPITAL_COMMUNITY)

## 2024-03-13 LAB — BASIC METABOLIC PANEL WITH GFR
Anion gap: 9 (ref 5–15)
BUN: 10 mg/dL (ref 8–23)
CO2: 19 mmol/L — ABNORMAL LOW (ref 22–32)
Calcium: 7.8 mg/dL — ABNORMAL LOW (ref 8.9–10.3)
Chloride: 101 mmol/L (ref 98–111)
Creatinine, Ser: 0.65 mg/dL (ref 0.44–1.00)
GFR, Estimated: >60 mL/min (ref >60)
Glucose, Bld: 133 mg/dL — ABNORMAL HIGH (ref 70–99)
Potassium: 3.3 mmol/L — ABNORMAL LOW (ref 3.5–5.1)
Sodium: 129 mmol/L — ABNORMAL LOW (ref 135–145)

## 2024-03-13 LAB — CBC
HCT: 25.5 % — ABNORMAL LOW (ref 36.0–46.0)
Hemoglobin: 8.8 g/dL — ABNORMAL LOW (ref 12.0–15.0)
MCH: 32.7 pg (ref 26.0–34.0)
MCHC: 34.5 g/dL (ref 30.0–36.0)
MCV: 94.8 fL (ref 80.0–100.0)
Platelets: 185 K/uL (ref 150–400)
RBC: 2.69 MIL/uL — ABNORMAL LOW (ref 3.87–5.11)
RDW: 12.5 % (ref 11.5–15.5)
WBC: 9 K/uL (ref 4.0–10.5)
nRBC: 0 % (ref 0.0–0.2)

## 2024-03-13 LAB — GLUCOSE, CAPILLARY
Glucose-Capillary: 143 mg/dL — ABNORMAL HIGH (ref 70–99)
Glucose-Capillary: 132 mg/dL — ABNORMAL HIGH (ref 70–99)
Glucose-Capillary: 140 mg/dL — ABNORMAL HIGH (ref 70–99)
Glucose-Capillary: 148 mg/dL — ABNORMAL HIGH (ref 70–99)
Glucose-Capillary: 118 mg/dL — ABNORMAL HIGH (ref 70–99)

## 2024-03-13 LAB — TRIGLYCERIDES: Triglycerides: 79 mg/dL (ref <150)

## 2024-03-13 MED ORDER — METOPROLOL SUCCINATE ER 25 MG PO TB24
25.0000 mg | ORAL_TABLET | Freq: Every day | ORAL | Status: DC
  Administered 2024-03-13: 25 mg via ORAL
  Filled 2024-03-13: qty 1

## 2024-03-13 MED ORDER — POTASSIUM CHLORIDE CRYS ER 20 MEQ PO TBCR
60.0000 meq | EXTENDED_RELEASE_TABLET | Freq: Once | ORAL | Status: AC
  Administered 2024-03-13: 60 meq via ORAL
  Filled 2024-03-13: qty 3

## 2024-03-13 MED ORDER — PANTOPRAZOLE SODIUM 40 MG PO TBEC
40.0000 mg | DELAYED_RELEASE_TABLET | Freq: Every day | ORAL | Status: DC
  Administered 2024-03-13 – 2024-03-14 (×2): 40 mg via ORAL
  Filled 2024-03-13 (×2): qty 1

## 2024-03-13 MED ORDER — POTASSIUM CHLORIDE 20 MEQ PO PACK: 60.0000 meq | PACK | Freq: Once | ORAL | Status: DC

## 2024-03-13 MED ORDER — LEVETIRACETAM 500 MG PO TABS
500.0000 mg | ORAL_TABLET | Freq: Two times a day (BID) | ORAL | Status: DC
  Administered 2024-03-13 – 2024-03-15 (×4): 500 mg via ORAL
  Filled 2024-03-13 (×4): qty 1

## 2024-03-13 MED ORDER — LEVOTHYROXINE SODIUM 100 MCG PO TABS
100.0000 ug | ORAL_TABLET | Freq: Every day | ORAL | Status: DC
  Administered 2024-03-13 – 2024-03-15 (×3): 100 ug via ORAL
  Filled 2024-03-13 (×3): qty 1

## 2024-03-13 MED ORDER — IRBESARTAN 150 MG PO TABS
150.0000 mg | ORAL_TABLET | Freq: Every day | ORAL | Status: DC
  Administered 2024-03-13: 150 mg via ORAL
  Filled 2024-03-13: qty 1

## 2024-03-13 NOTE — Progress Notes (Signed)
 Assessment 73 y/o F w/ hx HTN who presented after GLF, found to have right convexity SDH and possible CCJ EDH. Stable on repeat imaging yet patient was exhibiting lethargy. Underwent R craniotomy for SDH evacuation on 8/31  LOS: 1 day    Plan: Subdural drain to gravity - 50cc. Keep today JP drain to bulb suction - 3cc. DC today SBP<140 AAT DAT No collar or intervention for CCJ findings Ok for DVT chemoppx on 9/6  Subjective: Awake enough for an oral diet  Objective: Vital signs in last 24 hours: Temp:  [98.3 F (36.8 C)-99 F (37.2 C)] 98.3 F (36.8 C) (09/02 0400) Pulse Rate:  [65-102] 65 (09/02 0730) Resp:  [13-30] 20 (09/02 0730) BP: (86-133)/(44-114) 86/61 (09/02 0700) SpO2:  [89 %-100 %] 95 % (09/02 0730) Arterial Line BP: (109-156)/(47-64) 130/54 (09/02 0730)  Intake/Output from previous day: 09/01 0701 - 09/02 0700 In: 2643.8 [I.V.:2030.1; IV Piggyback:613.7] Out: 763 [Urine:710; Drains:53] Intake/Output this shift: No intake/output data recorded.  Exam: GCS 3E 4V 8M Oriented to name and place. Confused to situation Conjugate gaze, PERRL Grimace symmetric Commands BUE and BLE full strength Cranial incision c/d/I, staples Drains present  Lab Results: Recent Labs    03/12/24 0419 03/13/24 0448  WBC 8.3 9.0  HGB 10.0* 8.8*  HCT 28.0* 25.5*  PLT 178 185   BMET Recent Labs    03/12/24 0419 03/13/24 0448  NA 128* 129*  K 3.4* 3.3*  CL 99 101  CO2 21* 19*  GLUCOSE 137* 133*  BUN 9 10  CREATININE 0.65 0.65  CALCIUM  8.0* 7.8*    Studies/Results: CT HEAD WO CONTRAST Result Date: 03/12/2024 CLINICAL DATA:  73 year old female status post fall with right side subdural hematoma, postoperative day 0 or day 1 status post craniotomy and hematoma evacuation. EXAM: CT HEAD WITHOUT CONTRAST TECHNIQUE: Contiguous axial images were obtained from the base of the skull through the vertex without intravenous contrast. RADIATION DOSE REDUCTION: This exam was  performed according to the departmental dose-optimization program which includes automated exposure control, adjustment of the mA and/or kV according to patient size and/or use of iterative reconstruction technique. COMPARISON:  1748 hours yesterday. FINDINGS: Brain: Interval postoperative changes with right subdural drain in place. Small volume pneumocephalus. Regressed mixed density right side subdural hematoma. Mostly hyperdense residual now is up to 4-5 mm thickness on coronal images (versus between 7 and 10 mm on coronal images yesterday). Trace para falcine hyperdense blood also noted, not significantly changed. Decreased mass effect on the right hemisphere. Leftward midline shift now 6 mm, versus up to 11 mm yesterday. No ventriculomegaly. Basilar cistern patency has improved, especially the suprasellar cistern on sagittal image 31. No new intracranial hemorrhage. Stable gray-white matter differentiation throughout the brain. No cortically based acute infarct identified. Vascular: No suspicious intracranial vascular hyperdensity. Skull: New right side craniotomy. Sinuses/Orbits: Visualized paranasal sinuses and mastoids are stable and well aerated. Other: New postoperative changes to the right scalp. Superficial and percutaneous drains in place. Overlying skin staples. Superimposed right forehead and supraorbital scalp hematoma. Orbits soft tissues otherwise appear negative. IMPRESSION: 1. Postoperative decreased right side subdural hematoma, residual is 4-5 mm thickness on coronal images. Decreased intracranial mass effect, leftward midline shift now up to 6 mm, and substantially improved basilar cistern patency. 2. No new intracranial abnormality. Electronically Signed   By: VEAR Hurst M.D.   On: 03/12/2024 05:39   CT HEAD WO CONTRAST ( ) Result Date: 03/11/2024 EXAM: CT HEAD WITHOUT CONTRAST 03/11/2024 05:55:09 PM  TECHNIQUE: CT of the head was performed without the administration of intravenous contrast.  Automated exposure control, iterative reconstruction, and/or weight based adjustment of the mA/kV was utilized to reduce the radiation dose to as low as reasonably achievable. COMPARISON: CT of the head without contrast 03/10/2024. CLINICAL HISTORY: Head trauma, abnormal mental status (Age 21-64y); Fall. Pt fell in bathroom, in hospital room. Possible seizure. FINDINGS: BRAIN AND VENTRICLES: The mixed density subdural collection over the right convexity is stable in size, measuring approximately 6 mm. Mass effect and midline shift are stable to slightly increased, now measuring 10 mm. Minimal blood is again noted along the anterior cerebral falx. No new hemorrhage is present. ORBITS: No acute abnormality. SINUSES: No acute abnormality. SOFT TISSUES AND SKULL: No acute soft tissue abnormality. No skull fracture. IMPRESSION: 1. Stable mixed density subdural collection over the right convexity measuring approximately 6 mm. 2. Stable to slightly increased mass effect and midline shift, now measuring 10 mm. 3. Minimal blood along the anterior cerebral falx. No new hemorrhage. Electronically signed by: Lonni Necessary MD 03/11/2024 06:13 PM EDT RP Workstation: [id number]      Dorn JONELLE Glade 03/13/2024, 7:36 AM

## 2024-03-13 NOTE — Significant Event (Addendum)
 Evaluated the patient at bedside.  Reportedly patient had twitching of face on the right and was vocal during this time. On my evaluation, patient is more confused. However, strength is equal on both sides. Pupils equal and reactive.   Will get stat CTH.  Will ask Neurosurgery to evaluate (Neurosurgery primary).   Addendum: CT head showed worsening subdural hematoma.  I called and spoke to Dr. Debby who is covering for Dr. Darnella.  Per Dr. Debby expected postop changes.  No acute surgical intervention.  Recommended getting repeat CT head in the morning and an EEG right now.

## 2024-03-13 NOTE — Progress Notes (Signed)
 Urine output continues to be decreased at about 20-25 mL per hour. Total for shift at this time is 225 mL.   MD notified about this and that continuous fluids are set to automatically d/c on the [facility]. Patient is NPO.  No new orders at this time.

## 2024-03-13 NOTE — Plan of Care (Signed)
  Problem: Clinical Measurements: Goal: Respiratory complications will improve Outcome: Progressing Goal: Cardiovascular complication will be avoided Outcome: Progressing   Problem: Activity: Goal: Risk for activity intolerance will decrease Outcome: Progressing   Problem: Coping: Goal: Level of anxiety will decrease Outcome: Progressing   Problem: Pain Managment: Goal: General experience of comfort will improve and/or be controlled Outcome: Progressing   Problem: Clinical Measurements: Goal: Usual level of consciousness will be regained or maintained. Outcome: Progressing Goal: Neurologic status will improve Outcome: Progressing

## 2024-03-13 NOTE — Plan of Care (Signed)
  Problem: Education: Goal: Knowledge of General Education information will improve Description: Including pain rating scale, medication(s)/side effects and non-pharmacologic comfort measures Outcome: Progressing   Problem: Health Behavior/Discharge Planning: Goal: Ability to manage health-related needs will improve Outcome: Progressing   Problem: Clinical Measurements: Goal: Ability to maintain clinical measurements within normal limits will improve Outcome: Not Progressing Goal: Will remain free from infection Outcome: Not Progressing Goal: Diagnostic test results will improve Outcome: Progressing Goal: Respiratory complications will improve Outcome: Progressing Goal: Cardiovascular complication will be avoided Outcome: Not Progressing   Problem: Activity: Goal: Risk for activity intolerance will decrease Outcome: Not Progressing   Problem: Nutrition: Goal: Adequate nutrition will be maintained Outcome: Progressing   Problem: Coping: Goal: Level of anxiety will decrease Outcome: Progressing   Problem: Elimination: Goal: Will not experience complications related to bowel motility Outcome: Progressing Goal: Will not experience complications related to urinary retention Outcome: Progressing   Problem: Pain Managment: Goal: General experience of comfort will improve and/or be controlled Outcome: Progressing   Problem: Safety: Goal: Ability to remain free from injury will improve Outcome: Progressing   Problem: Education: Goal: Knowledge of the prescribed therapeutic regimen will improve Outcome: Progressing   Problem: Clinical Measurements: Goal: Usual level of consciousness will be regained or maintained. Outcome: Progressing Goal: Neurologic status will improve Outcome: Progressing Goal: Ability to maintain intracranial pressure will improve Outcome: Progressing   Problem: Skin Integrity: Goal: Demonstration of wound healing without infection will  improve Outcome: Progressing

## 2024-03-13 NOTE — Consult Note (Signed)
 NAME:  Colleen [name], MRN:  [id number], DOB:  1950/08/26, LOS: 1 ADMISSION DATE:  03/09/2024, CONSULTATION DATE:  03/12/24  REFERRING MD:  MD Malcolm CHIEF COMPLAINT:  Subdural Hematoma   History of Present Illness:  Pt is a 73 yr old female with significant pmhx of HTN, Asthma, GERD, lethargy/catanoia anxiety, and depression who presented to [facility] post fall with complaints of headache, neck pain, and nausea on 03/09/24. CT scans of maxillofacial, head, C-Spine were obtained revealed, right scalp/periorbital hematoma, acute subdural hemorrhage along the right cerebral convexity, measuring up to 8 mm in thickness with mass effect and with 8 mm leftward midline shift, acute subdural hemorrhage also present along the right aspect of the falx, measuring up to 5 mm in thickness, and a Epidural hematoma at the craniocervical junction and within the cervical spinal canal, 4mm. Patient transferred to [facility] for further neuro evaluation and MRI of cervical spine. MRI showing small dorsal epidural hematoma located between CI vertebrae and occiput.  On 8/31, patient having worsening neurostatus becoming more lethargic. Patient was given 6mg  of ativan  for history catatonia/lethargic with no improvement subsequently patient taken for a craniotomy for subdural hematoma evacuation.   Pertinent  Medical History   Past Medical History:  Diagnosis Date   Allergy    Anxiety    Arrhythmia    does not see cardiologist, sees pcp, Dr. 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  Neurosurgery following.  Objective    Blood pressure (!) 96/53, pulse 78, temperature 98.4 F (36.9 C), temperature source Axillary, resp. rate (!) 26, height 5' 4.02 (1.626 m), weight 63.2 kg, SpO2 98%.        Intake/Output Summary (Last 24 hours) at 03/13/2024 1242 Last data filed at 03/13/2024 1200 Gross per 24 hour  Intake 2330.04 ml  Output 698 ml  Net 1632.04 ml   Filed Weights   03/09/24 2030 03/12/24 0238  Weight: 65 kg 63.2 kg    Examination: General: acute on chronic older female, sitting up in ICU bed s/p crani, no distress  HENT: Normocephalic, moist mucous membrane. Lungs: Clear to auscultation bilaterally. Cardiovascular: Heart rate regular in rate and rhythm.  No murmurs appreciated. Abdomen: bs, soft  Neuro: Following commands.  Alert and oriented x 3.  Moving all 4 extremities with strength 5/5.  Resolved problem list   Assessment and Plan  Right Craniotomy for SDH Evacuation - 8/31  Hx of Lethargy/Catatonia  8/29 CT of maxillofacial, head, and C-spine- acute subdural hemorrhage along the right  cerebral convexity, measuring up to 8 mm in thickness with mass effect and with 8 mm leftward midline shift, acute subdural hemorrhage also present along the right aspect of the falx, measuring up to 5 mm in thickness, and a Epidural hematoma at the craniocervical junction and within the cervical spinal canal, 4mm. MRI showing small dorsal epidural hematoma located between CI vertebrae and occiput. 8/31 Worsening neuro status  P:  Subdural drain to gravity, JP drain to bulb  suction  SBP goal < 140  On Cleviprex .  Restarted home medications [BB, ARB] Holding anticoag per neurosurg recs. DVT prophylaxis can be resumed on 9/6. Continue Keppra  BID   Hyponatremia  Monitor sodium.  HTN  P:  SBP goal < 140 as above Start Cleviprex  gtt  Resume home BB and ARB.  GERD P: P.o. Protonix .  Anxiety  Depression  P: Supportive care  Continue Effexor  and Lamictal     Labs   CBC: Recent Labs  Lab 03/09/24 1717 03/11/24 2338 03/12/24 0114 03/12/24 0419 03/13/24 0448  WBC 6.8  --  7.1 8.3 9.0  NEUTROABS 5.2  --   --  7.3  --   HGB 11.6* 7.5* 9.7* 10.0* 8.8*  HCT 32.2* 22.0* 27.4* 28.0* 25.5*  MCV 91.5  --  93.2 93.3 94.8  PLT 204  --  176 178 185    Basic Metabolic Panel: Recent Labs  Lab 03/09/24 1717 03/11/24 2338 03/12/24 0419 03/13/24 0448  NA 132* 125* 128* 129*  K 4.5 3.1* 3.4* 3.3*  CL 98  --  99 101  CO2 23  --  21* 19*  GLUCOSE 100*  --  137* 133*  BUN 9  --  9 10  CREATININE 0.87  --  0.65 0.65  CALCIUM  9.3  --  8.0* 7.8*   GFR: Estimated Creatinine Clearance: 54.9 mL/min (by C-G formula based on SCr of 0.65 mg/dL). Recent Labs  Lab 03/09/24 1717 03/12/24 0114 03/12/24 0419 03/13/24 0448  WBC 6.8 7.1 8.3 9.0    Liver Function Tests: No results for input(s): AST, ALT, ALKPHOS, BILITOT, PROT, ALBUMIN in the last 168 hours. No results for input(s): LIPASE, AMYLASE in the last 168 hours. No results for input(s): AMMONIA in the last 168 hours.  ABG    Component Value Date/Time   PHART 7.333 (L) 03/11/2024 2338   PCO2ART 36.2 03/11/2024 2338   PO2ART 220 (H) 03/11/2024 2338   HCO3 19.6 (L) 03/11/2024 2338   TCO2 21 (L) 03/11/2024 2338   ACIDBASEDEF 6.0 (H) 03/11/2024 2338   O2SAT 100 03/11/2024 2338     Coagulation Profile: No results for input(s): INR, PROTIME in the last 168 hours.  Cardiac Enzymes: No results for input(s): CKTOTAL, CKMB, CKMBINDEX, TROPONINI in the last 168  hours.  HbA1C: Hgb A1c MFr Bld  Date/Time Value Ref Range Status  10/17/2021 06:21 AM 5.1 4.8 - 5.6 % Final    Comment:    (NOTE) Pre diabetes:          5.7%-6.4%  Diabetes:              >6.4%  Glycemic control for   <7.0% adults with diabetes   09/09/2021 04:44 PM 4.8 4.8 - 5.6 % Final    Comment:    (NOTE) Pre diabetes:          5.7%-6.4%  Diabetes:              >6.4%  Glycemic control for   <7.0% adults with diabetes  CBG: Recent Labs  Lab 03/12/24 1915 03/12/24 2315 03/13/24 0320 03/13/24 0741 03/13/24 1127  GLUCAP 137* 162* 143* 132* 140*    Review of Systems:   See HPI   Past Medical History:  She,  has a past medical history of Allergy, Anxiety, Arrhythmia, Asthma, Bronchitis, Depression, Family history of anesthesia complication, GERD (gastroesophageal reflux disease), Goiter (03/17/2012), Heartburn, Herpes, Hypertension, Seizures (HCC), and Urinary tract infection.   Surgical History:   Past Surgical History:  Procedure Laterality Date   CESAREAN SECTION     x 2   COLONOSCOPY     ELBOW SURGERY     right   KNEE ARTHROSCOPY     right   PILONIDAL CYST EXCISION     spinal cystectomy     where pilonidal cyst was   THYROIDECTOMY  04/07/2012   Procedure: THYROIDECTOMY;  Surgeon: Sherlean JINNY Laughter, MD;  Location: [facility] OR;  Service: General;  Laterality: N/A;        Social History:   reports that she quit smoking about 24 years ago. Her smoking use included cigarettes. She has never used smokeless tobacco. She reports that she does not drink alcohol and does not use drugs.   Family History:  Her family history includes Atrial fibrillation in her sister; Cervical cancer in her mother; Cirrhosis in her brother; Fibromyalgia in her sister; Heart disease in her father; Heart failure in her father; Hypertension in her brother and father; Irritable bowel syndrome in her brother; Prostate cancer in her brother; Seizures in her brother and father. There is  no history of Breast cancer.   Allergies Allergies  Allergen Reactions   Amlodipine  Other (See Comments)    Flushing, tingling in face, sore throat  (Norvasc )   Cyclobenzaprine Hcl Other (See Comments)    Seizures    Aripiprazole Other (See Comments)   Cymbalta  [Duloxetine  Hcl] Other (See Comments)    Made the depression worse   Tape Rash    Blisters     Home Medications  Prior to Admission medications   Medication Sig Start Date End Date Taking? Authorizing Provider  acyclovir  (ZOVIRAX ) 400 MG tablet Take 400 mg by mouth 3 (three) times daily as needed (Flare ups). 01/09/24  Yes [provider]  busPIRone  (BUSPAR ) 7.5 MG tablet Take 7.5 mg by mouth 2 (two) times daily.   Yes [provider]  lamoTRIgine  (LAMICTAL ) 150 MG tablet Take 150 mg by mouth daily.   Yes [provider]  levothyroxine  (SYNTHROID ) 100 MCG tablet Take 1 tablet (100 mcg total) by mouth daily. 10/10/21  Yes Von Bellis, MD  metoprolol  succinate (TOPROL -XL) 25 MG 24 hr tablet Take 25 mg by mouth daily.   Yes [provider]  olmesartan (BENICAR) 20 MG tablet Take 20 mg by mouth daily. 01/07/24  Yes [provider]  risperiDONE  (RISPERDAL ) 3 MG tablet Take 3 mg by mouth at bedtime. 03/06/24  Yes [provider]  traZODone  (DESYREL ) 50 MG tablet Take 50 mg by mouth at bedtime. 03/06/24  Yes [provider]  venlafaxine  XR (EFFEXOR -XR) 150 MG 24 hr capsule Take 300 mg by mouth daily.   Yes [provider]     Critical care time: 34 mins      Sammi Fredericks, MD.    [facility] 03/13/2024, 12:42 PM  Please see [email or web address] for pager details.  From 7A-7P if no response, please call [phone]. After hours, please call ELink [phone].

## 2024-03-13 NOTE — Evaluation (Signed)
 Speech Language Pathology Evaluation Patient Details Name: Colleen [name] MRN: [id number] DOB: October 19, 1950 Today's Date: 03/13/2024 Time: 9060-9050 SLP Time Calculation (min) (ACUTE ONLY): 10 min  Problem List:  Patient Active Problem List   Diagnosis Date Noted   Subdural hematoma (HCC) 03/09/2024   Hyponatremia 10/24/2021   Seizure (HCC) 10/10/2021   Major depressive disorder, recurrent severe without psychotic features (HCC) 10/10/2021   MDD (major depressive disorder), recurrent severe, without psychosis (HCC) 10/09/2021   Suicidal ideation 10/07/2021   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   Abnormal involuntary movements 03/31/2015   Syncope 04/29/2014   Sinus tachycardia 11/08/2013   Allergic rhinitis, seasonal 06/30/2011   Fibrocystic breast changes 12/10/2010   PALPITATIONS 04/15/2009   Anxiety state 01/01/2008   GERD 01/01/2008   Past Medical History:  Past Medical History:  Diagnosis Date   Allergy    Anxiety    Arrhythmia    does not see cardiologist, sees pcp, Dr. 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    Heartburn    Herpes    on medication for outbreaks only   Hypertension    Seizures (HCC)    MEDICATION INDUCED   Urinary tract infection    hx of   Past Surgical History:  Past Surgical History:  Procedure Laterality Date   CESAREAN SECTION     x 2   COLONOSCOPY     ELBOW SURGERY     right   KNEE ARTHROSCOPY     right   PILONIDAL CYST EXCISION     spinal cystectomy     where pilonidal cyst was   THYROIDECTOMY  04/07/2012   Procedure: THYROIDECTOMY;   Surgeon: Sherlean JINNY Laughter, MD;  Location: [facility] OR;  Service: General;  Laterality: N/A;      HPI:  Colleen [name] is a 73 yo female presenting to ED 8/29 after a fall with headache, neck pain, and nausea. CT shows R scalp/periorbital hematoma, acute SDH along the R cerebral convexity measuring 8 mm thickness with mass effect and 8 mm leftward midline shift as well as an acute SDH along the R aspect of the falx measuring up to 5 mm in thickness, and an epidural hematoma at the craniocervical junction. MRI shows small dorsal epidural hematoma between C1 vertebrae and occiput. Worsening neurostatus 8/31, taken for R craniotomy for subdural hematoma evacuation. PMH includes HTN, asthma, GERD, anxiety/depression, thyroid  garter s/p total thyroidectomy 2013   Assessment / Plan / Recommendation Clinical Impression  Pt is drowsy, which is suspected to impact her performance with both functional and structured tasks. She is oriented x4 but intermittently requires cueing for initiation of verbal responses. Her attention fluctuates but was able to be sustained for periods long enough to participate in evaluation. She demonstrates deficits related to problem solving and reasoning with limited awareness of errors. She independently recalled four novel words after a delay. Recommend ongoing SLP f/u for further assessment as lethargy allows.    SLP Assessment  SLP Recommendation/Assessment: Patient needs continued Speech Language Pathology Services SLP Visit Diagnosis: Cognitive  communication deficit (R41.841)     Assistance Recommended at Discharge  Frequent or constant Supervision/Assistance  Functional Status Assessment Patient has had a recent decline in their functional status and demonstrates the ability to make significant improvements in function in a reasonable and predictable amount of time.  Frequency and Duration min 2x/week  2 weeks      SLP Evaluation Cognition  Overall Cognitive Status:  Impaired/Different from baseline Arousal/Alertness: Awake/alert Orientation Level: Oriented X4 Attention: Sustained Sustained Attention: Impaired Sustained Attention Impairment: Functional basic Memory: Appears intact Awareness: Impaired Awareness Impairment: Emergent impairment Problem Solving: Impaired Problem Solving Impairment: Functional basic Executive Function: Reasoning Reasoning: Impaired Reasoning Impairment: Verbal basic;Verbal complex       Comprehension  Auditory Comprehension Overall Auditory Comprehension: Appears within functional limits for tasks assessed    Expression Expression Primary Mode of Expression: Verbal Verbal Expression Overall Verbal Expression: Appears within functional limits for tasks assessed   Oral / Motor  Oral Motor/Sensory Function Overall Oral Motor/Sensory Function: Within functional limits Motor Speech Overall Motor Speech: Appears within functional limits for tasks assessed            Damien Blumenthal, M.A., CCC-SLP Speech Language Pathology, Acute Rehabilitation Services  Secure Chat preferred [phone]  03/13/2024, 10:36 AM

## 2024-03-13 NOTE — Progress Notes (Signed)
 I personally reviewed CT head.  Minimal change of residual small right convexity subdural/postoperative products compared with postop CT head performed yesterday, still measuring 5 mm.  No significant increased midline shift, and midline shift improved compared with preoperatively.  Subdural drain still in place.  Can obtain EEG for twitching, slurred speech.  Would recommend radiographic f/u with CT head without contrast tomorrow morning.

## 2024-03-13 NOTE — Progress Notes (Addendum)
 1030 neurosurgery removed JP drain pt tolerated well 3 additional staples placed. Patient passed swallow regular diet started.  1130 updated daughter Rollo via phone  1200 foley removed bath completed, patient dangled to bedside patient reports being dizzy the entire time and asking for tylenol  after lying back down. 1700 talking with patient about dinner 1705 left side of face twitching patient unable to speak at first then states she can't stop it as twitching subsides no more than 5 seconds pt then had garbled speech and was confused with commands asked CCM in hall state head ct paged neurosurgery 1733 back from head CT patient having nausea zofran  given paged dr debby on call neurosurgery PIV LAC removed leaking, consult to IV team no access seen art line in and patient has skin tear to rfa 1558 bladder scanned 174 ml  1815 IR called with test results of CT informed them to call on call neurosurgery also informed CCM of the changes 1618 called martinique neurosurgery for physician on call to call back   1823 called daughter told her about the twitching and AMS and slurred speech and Head CT was completed  1833 EEG ordered for now and repeat CT at 5am

## 2024-03-13 NOTE — Progress Notes (Signed)
 Speech Language Pathology Treatment: Dysphagia  Patient Details Name: Colleen [name] MRN: [id number] DOB: Aug 29, 1950 Today's Date: 03/13/2024 Time: 9070-9060 SLP Time Calculation (min) (ACUTE ONLY): 10 min  Assessment / Plan / Recommendation Clinical Impression  Pt is awake and follows all commands given extra time. She held the cup to take sequential sips of thin liquids without signs clinically concerning for aspiration, including following the 3 oz water test. Mastication was observed to be prolonged with regular solids but she achieves complete oral clearance with both purees and solids, using a liquid wash as needed. Her intake is expected to fluctuate depending on her mentation. Recommend assistance with ordering and feeding from family or nursing staff but otherwise feel a regular diet with thin liquids is appropriate. SLP will f/u.   HPI HPI: Colleen [name] is a 73 yo female presenting to ED 8/29 after a fall with headache, neck pain, and nausea. CT shows R scalp/periorbital hematoma, acute SDH along the R cerebral convexity measuring 8 mm thickness with mass effect and 8 mm leftward midline shift as well as an acute SDH along the R aspect of the falx measuring up to 5 mm in thickness, and an epidural hematoma at the craniocervical junction. MRI shows small dorsal epidural hematoma between C1 vertebrae and occiput. Worsening neurostatus 8/31, taken for R craniotomy for subdural hematoma evacuation. PMH includes HTN, asthma, GERD, anxiety/depression, thyroid  garter s/p total thyroidectomy 2013      SLP Plan  Continue with current plan of care          Recommendations  Diet recommendations: Regular;Thin liquid Liquids provided via: Cup;Straw Medication Administration: Whole meds with liquid Supervision: Staff to assist with self feeding;Full supervision/cueing for compensatory strategies Compensations: Minimize environmental distractions;Slow rate;Small sips/bites Postural Changes  and/or Swallow Maneuvers: Seated upright 90 degrees                  Oral care BID   Frequent or constant Supervision/Assistance Dysphagia, unspecified (R13.10)     Continue with current plan of care     Damien Blumenthal, M.A., CCC-SLP Speech Language Pathology, Acute Rehabilitation Services  Secure Chat preferred [phone]   03/13/2024, 10:23 AM

## 2024-03-14 ENCOUNTER — Inpatient Hospital Stay (HOSPITAL_COMMUNITY)

## 2024-03-14 DIAGNOSIS — R569 Unspecified convulsions: Secondary | ICD-10-CM | POA: Diagnosis not present

## 2024-03-14 LAB — CBC
HCT: 27.9 % — ABNORMAL LOW (ref 36.0–46.0)
Hemoglobin: 9.8 g/dL — ABNORMAL LOW (ref 12.0–15.0)
MCH: 32.9 pg (ref 26.0–34.0)
MCHC: 35.1 g/dL (ref 30.0–36.0)
MCV: 93.6 fL (ref 80.0–100.0)
Platelets: 244 K/uL (ref 150–400)
RBC: 2.98 MIL/uL — ABNORMAL LOW (ref 3.87–5.11)
RDW: 12.5 % (ref 11.5–15.5)
WBC: 9.1 K/uL (ref 4.0–10.5)
nRBC: 0 % (ref 0.0–0.2)

## 2024-03-14 LAB — BASIC METABOLIC PANEL WITH GFR
Anion gap: UNDETERMINED (ref 5–15)
Anion gap: 10 (ref 5–15)
BUN: UNDETERMINED mg/dL (ref 8–23)
BUN: 9 mg/dL (ref 8–23)
CO2: UNDETERMINED mmol/L (ref 22–32)
CO2: 19 mmol/L — ABNORMAL LOW (ref 22–32)
Calcium: UNDETERMINED mg/dL (ref 8.9–10.3)
Calcium: 7.8 mg/dL — ABNORMAL LOW (ref 8.9–10.3)
Chloride: UNDETERMINED mmol/L (ref 98–111)
Chloride: 99 mmol/L (ref 98–111)
Creatinine, Ser: UNDETERMINED mg/dL (ref 0.44–1.00)
Creatinine, Ser: 0.64 mg/dL (ref 0.44–1.00)
GFR calc Af Amer: UNDETERMINED mL/min (ref >60)
GFR, Estimated: UNDETERMINED mL/min (ref >60)
GFR, Estimated: >60 mL/min (ref >60)
Glucose, Bld: UNDETERMINED mg/dL (ref 70–99)
Glucose, Bld: 114 mg/dL — ABNORMAL HIGH (ref 70–99)
Potassium: UNDETERMINED mmol/L (ref 3.5–5.1)
Potassium: 3.7 mmol/L (ref 3.5–5.1)
Sodium: UNDETERMINED mmol/L (ref 135–145)
Sodium: 128 mmol/L — ABNORMAL LOW (ref 135–145)

## 2024-03-14 LAB — GLUCOSE, CAPILLARY: Glucose-Capillary: 106 mg/dL — ABNORMAL HIGH (ref 70–99)

## 2024-03-14 LAB — CREATININE, URINE, RANDOM: Creatinine, Urine: 55 mg/dL

## 2024-03-14 LAB — SODIUM, URINE, RANDOM: Sodium, Ur: <30 mmol/L

## 2024-03-14 LAB — OSMOLALITY: Osmolality: 273 mosm/kg — ABNORMAL LOW (ref 275–295)

## 2024-03-14 MED ORDER — RISPERIDONE 3 MG PO TABS
3.0000 mg | ORAL_TABLET | Freq: Every day | ORAL | Status: DC
  Administered 2024-03-14: 3 mg via ORAL
  Filled 2024-03-14 (×2): qty 1

## 2024-03-14 MED ORDER — POTASSIUM CHLORIDE CRYS ER 20 MEQ PO TBCR: 40.0000 meq | EXTENDED_RELEASE_TABLET | ORAL | Status: DC

## 2024-03-14 MED ORDER — CARVEDILOL 3.125 MG PO TABS
6.2500 mg | ORAL_TABLET | Freq: Two times a day (BID) | ORAL | Status: DC
  Administered 2024-03-14 – 2024-03-15 (×3): 6.25 mg via ORAL
  Filled 2024-03-14 (×3): qty 2

## 2024-03-14 MED ORDER — IRBESARTAN 150 MG PO TABS
300.0000 mg | ORAL_TABLET | Freq: Every day | ORAL | Status: DC
  Administered 2024-03-14 – 2024-03-15 (×2): 300 mg via ORAL
  Filled 2024-03-14 (×2): qty 2

## 2024-03-14 NOTE — Progress Notes (Signed)
 Inpatient Rehab Admissions Coordinator Note:   Per therapy patient was screened for CIR candidacy by Reche FORBES Lowers, PT. At this time, pt appears to be a potential candidate for CIR. I will place an order for rehab consult for full assessment, per our protocol.  Please contact me any with questions.SABRA Reche Lowers, PT, DPT [phone] 03/14/24 4:23 PM

## 2024-03-14 NOTE — Evaluation (Signed)
 Physical Therapy Evaluation Patient Details Name: Colleen [name] MRN: [id number] DOB: 04-29-1951 Today's Date: 03/14/2024  History of Present Illness  Colleen [name] is a 73 yo female presenting to ED 8/29 after a fall with headache, neck pain, and nausea. CT shows R scalp/periorbital hematoma, acute SDH along the R cerebral convexity measuring 8 mm thickness with mass effect and 8 mm leftward midline shift as well as an acute SDH along the R aspect of the falx measuring up to 5 mm in thickness, and an epidural hematoma at the craniocervical junction. MRI shows small dorsal epidural hematoma between C1 vertebrae and occiput. Worsening neurostatus 8/31, taken for R craniotomy for subdural hematoma evacuation. PMH includes HTN, asthma, GERD, anxiety/depression, thyroid  garter s/p total thyroidectomy 2013  Clinical Impression  Pt admitted with above. PTA, pt lives alone and is independent; enjoys sketching and reading. On PT evaluation, pt with flat affect, but overall is oriented and follows commands appropriately. Pt with equal strength bilaterally. Requiring min assist (+2 safety), for transitions to standing and on and off bedside commode with handheld assist. Able to perform posterior peri care, but ultimately requires assist to complete due to unsteadiness and posterior loss of balance. Pt reports dizziness; BP within parameters. Patient will benefit from intensive inpatient follow-up therapy, >3 hours/day to address deficits and maximize functional independence.        If plan is discharge home, recommend the following: A little help with walking and/or transfers;A little help with bathing/dressing/bathroom;Assistance with cooking/housework;Help with stairs or ramp for entrance;Assist for transportation;Supervision due to cognitive status   Can travel by private vehicle        Equipment Recommendations Other (comment) (TBA)  Recommendations for Other Services  Rehab consult    Functional  Status Assessment Patient has had a recent decline in their functional status and demonstrates the ability to make significant improvements in function in a reasonable and predictable amount of time.     Precautions / Restrictions Precautions Precautions: Fall Precaution/Restrictions Comments: SBP < 140, aline Restrictions Weight Bearing Restrictions Per Provider Order: No      Mobility  Bed Mobility Overal bed mobility: Needs Assistance Bed Mobility: Supine to Sit     Supine to sit: Contact guard     General bed mobility comments: Step by step cueing    Transfers Overall transfer level: Needs assistance Equipment used: None Transfers: Sit to/from Stand, Bed to chair/wheelchair/BSC Sit to Stand: Min assist, +2 safety/equipment   Step pivot transfers: Min assist, +2 safety/equipment       General transfer comment: MinA to rise from edge of bed and steady, mildly impulsive pivoting to [facility], stood several times for peri care (pt performed and then OT completed). Steps over to chair from [facility]    Ambulation/Gait                  Stairs            Wheelchair Mobility     Tilt Bed    Modified Rankin (Stroke Patients Only)       Balance Overall balance assessment: Needs assistance Sitting-balance support: Feet supported Sitting balance-Leahy Scale: Fair     Standing balance support: No upper extremity supported, During functional activity Standing balance-Leahy Scale: Poor Standing balance comment: Posterior LOB with no external support                             Pertinent Vitals/Pain Pain Assessment  Pain Assessment: No/denies pain    Home Living Family/patient expects to be discharged to:: Private residence Living Arrangements: Alone Available Help at Discharge: Family;Available PRN/intermittently Type of Home: House Home Access: Stairs to enter   [employer] of Steps: 2   Home Layout: One level Home Equipment:  Grab bars - toilet;Grab bars - tub/shower      Prior Function Prior Level of Function : Independent/Modified Independent;Driving                     Extremity/Trunk Assessment   Upper Extremity Assessment Upper Extremity Assessment: Left hand dominant;Defer to OT evaluation    Lower Extremity Assessment Lower Extremity Assessment: RLE deficits/detail;LLE deficits/detail RLE Deficits / Details: Strength 5/5 LLE Deficits / Details: Strength 5/5    Cervical / Trunk Assessment Cervical / Trunk Assessment: Normal  Communication   Communication Communication: Other (comment) (word finding difficulties)    Cognition Arousal: Alert Behavior During Therapy: Flat affect   PT - Cognitive impairments: Sequencing, Awareness, Initiation                       PT - Cognition Comments: Pt A&O x 3, flat affect, responds to questions appropriately with mild word finding difficulty and slow speech. Following commands: Intact       Cueing Cueing Techniques: Verbal cues     General Comments      Exercises     Assessment/Plan    PT Assessment Patient needs continued PT services  PT Problem List Decreased strength;Decreased activity tolerance;Decreased mobility;Decreased balance;Decreased cognition       PT Treatment Interventions DME instruction;Gait training;Stair training;Functional mobility training;Therapeutic activities;Therapeutic exercise;Balance training;Patient/family education    PT Goals (Current goals can be found in the Care Plan section)  Acute Rehab PT Goals Patient Stated Goal: did not state PT Goal Formulation: With patient Time For Goal Achievement: 03/28/24 Potential to Achieve Goals: Good    Frequency Min 3X/week     Co-evaluation PT/OT/SLP Co-Evaluation/Treatment: Yes Reason for Co-Treatment: Complexity of the patient's impairments (multi-system involvement);For patient/therapist safety;To address functional/ADL transfers PT goals  addressed during session: Mobility/safety with mobility         AM-PAC PT 6 Clicks Mobility  Outcome Measure Help needed turning from your back to your side while in a flat bed without using bedrails?: A Little Help needed moving from lying on your back to sitting on the side of a flat bed without using bedrails?: A Little Help needed moving to and from a bed to a chair (including a wheelchair)?: A Little Help needed standing up from a chair using your arms (e.g., wheelchair or bedside chair)?: A Little Help needed to walk in hospital room?: A Lot Help needed climbing 3-5 steps with a railing? : Total 6 Click Score: 15    End of Session Equipment Utilized During Treatment: Gait belt Activity Tolerance: Patient tolerated treatment well Patient left: in chair;with call bell/phone within reach;with chair alarm set Nurse Communication: Mobility status PT Visit Diagnosis: Unsteadiness on feet (R26.81);Difficulty in walking, not elsewhere classified (R26.2)    Time: 8484-8449 PT Time Calculation (min) (ACUTE ONLY): 35 min   Charges:   PT Evaluation $PT Eval Moderate Complexity: 1 Mod   PT General Charges $$ ACUTE PT VISIT: 1 Visit         Aleck Daring, PT, DPT Acute Rehabilitation Services Office [phone]   Alayne ONEIDA Daring 03/14/2024, 4:10 PM

## 2024-03-14 NOTE — Progress Notes (Deleted)
 Pharmacy Electrolyte Replacement  Recent Labs:  Recent Labs    03/13/24 0448  K 3.3*  CREATININE 0.65    Low Critical Values (K </= 2.5, Phos </= 1, Mg </= 1) Present: None  Plan: Give KCL 40 meq PO x 2 doses. Recheck levels with AM labs.   Sanaai Doane, PharmD

## 2024-03-14 NOTE — Progress Notes (Addendum)
 0710 patient alert x4 on room air able to make needs known patient SBP elevated PRN meds given. Bath and CHG completed after patient had bowl movement. 9247 Neurosurgery removed ventricular drain and placed staples patient tolerated well  0800 AM labs sent

## 2024-03-14 NOTE — Plan of Care (Signed)
  Problem: Education: Goal: Knowledge of General Education information will improve Description: Including pain rating scale, medication(s)/side effects and non-pharmacologic comfort measures Outcome: Progressing   Problem: Health Behavior/Discharge Planning: Goal: Ability to manage health-related needs will improve Outcome: Progressing   Problem: Clinical Measurements: Goal: Ability to maintain clinical measurements within normal limits will improve Outcome: Not Progressing Goal: Will remain free from infection Outcome: Not Progressing Goal: Diagnostic test results will improve Outcome: Progressing Goal: Respiratory complications will improve Outcome: Progressing Goal: Cardiovascular complication will be avoided Outcome: Not Progressing   Problem: Activity: Goal: Risk for activity intolerance will decrease Outcome: Progressing   Problem: Nutrition: Goal: Adequate nutrition will be maintained Outcome: Not Progressing   Problem: Coping: Goal: Level of anxiety will decrease Outcome: Progressing   Problem: Elimination: Goal: Will not experience complications related to bowel motility Outcome: Progressing Goal: Will not experience complications related to urinary retention Outcome: Progressing   Problem: Pain Managment: Goal: General experience of comfort will improve and/or be controlled Outcome: Not Progressing   Problem: Safety: Goal: Ability to remain free from injury will improve Outcome: Not Progressing   Problem: Skin Integrity: Goal: Risk for impaired skin integrity will decrease Outcome: Not Progressing   Problem: Education: Goal: Knowledge of the prescribed therapeutic regimen will improve Outcome: Not Progressing   Problem: Clinical Measurements: Goal: Usual level of consciousness will be regained or maintained. Outcome: Progressing Goal: Neurologic status will improve Outcome: Progressing Goal: Ability to maintain intracranial pressure will  improve Outcome: Progressing   Problem: Skin Integrity: Goal: Demonstration of wound healing without infection will improve Outcome: Progressing

## 2024-03-14 NOTE — Procedures (Signed)
 Patient Name: Colleen [name]  MRN: [id number]  Epilepsy Attending: Arlin MALVA Krebs  Referring Physician/Provider: Theodoro Lakes, MD  Date: 03/14/2024 Duration: 24.20 mins  Patient history: 73 y/o F w/ hx HTN who presented after GLF, found to have right convexity SDH and possible CCJ EDH. EEG to evaluate for seizure  Level of alertness: Awake, asleep  AEDs during EEG study: LEV, LTG  Technical aspects: This EEG study was done with scalp electrodes positioned according to the 10-20 International system of electrode placement. Electrical activity was reviewed with band pass filter of 1-70Hz , sensitivity of 7 uV/mm, display speed of 59mm/sec with a 60Hz  notched filter applied as appropriate. EEG data were recorded continuously and digitally stored.  Video monitoring was available and reviewed as appropriate.  Description: The posterior dominant rhythm consists of 8Hz  activity of moderate voltage (25-35 uV) seen predominantly in posterior head regions, symmetric and reactive to eye opening and eye closing. Sleep was characterized by vertex waves, sleep spindles (12 to 14 Hz), maximal frontocentral region.  EEG showed continuous generalized and lateralized right hemisphere 3 to 6 Hz theta-delta slowing. There is also overriding 13-15hz  fast activity in right frontal region consistent with breach artifact. Hyperventilation and photic stimulation were not performed.     ABNORMALITY - Breach artifact, right frontal region - Continuous slow, generalized and lateralized right hemisphere  IMPRESSION: This study was suggestive of cortical dysfunction arising from right frontal region consistent with underlying craniotomy. Additionally there was cortical dysfunction in right hemisphere, likely secondary to underlying structural abnormality. Lastly there was mild to moderate diffuse encephalopathy. No seizures or epileptiform discharges were seen throughout the recording.  Izel Hochberg O Kenedee Molesky

## 2024-03-14 NOTE — Consult Note (Addendum)
 NAME:  Colleen [name], MRN:  [id number], DOB:  04/20/1951, LOS: 2 ADMISSION DATE:  03/09/2024, CONSULTATION DATE:  03/12/24  REFERRING MD:  MD Malcolm CHIEF COMPLAINT:  Subdural Hematoma   History of Present Illness:  Pt is a 73 yr old female with significant pmhx of HTN, Asthma, GERD, lethargy/catanoia anxiety, and depression who presented to [facility] post fall with complaints of headache, neck pain, and nausea on 03/09/24. CT scans of maxillofacial, head, C-Spine were obtained revealed, right scalp/periorbital hematoma, acute subdural hemorrhage along the right cerebral convexity, measuring up to 8 mm in thickness with mass effect and with 8 mm leftward midline shift, acute subdural hemorrhage also present along the right aspect of the falx, measuring up to 5 mm in thickness, and a Epidural hematoma at the craniocervical junction and within the cervical spinal canal, 4mm. Patient transferred to [facility] for further neuro evaluation and MRI of cervical spine. MRI showing small dorsal epidural hematoma located between CI vertebrae and occiput.  On 8/31, patient having worsening neurostatus becoming more lethargic. Patient was given 6mg  of ativan  for history catatonia/lethargic with no improvement subsequently patient taken for a craniotomy for subdural hematoma evacuation.   Pertinent  Medical History   Past Medical History:  Diagnosis Date   Allergy    Anxiety    Arrhythmia    does not see cardiologist, sees pcp, Dr. 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  CT head showed worsening SDH.  Repeat CT head with stable SDH.  Discussed with neurosurgery, expected postop changes.  No surgical interventions.  EEG negative.  Interim History / Subjective:  All drains out.  Neurosurgery primary team. Patient is alert and oriented x 3.  No other overnight events.  Objective    Blood pressure (!) 123/47, pulse 73, temperature 98.5 F (36.9 C), temperature source Oral, resp. rate (!) 26, height 5' 4.02 (1.626 m), weight 63.2 kg, SpO2 91%.        Intake/Output Summary (Last 24 hours) at 03/14/2024 1227 Last data filed at 03/14/2024 1200 Gross per 24 hour  Intake 962.14 ml  Output 100 ml  Net 862.14 ml   Filed Weights   03/09/24 2030 03/12/24 0238  Weight: 65 kg 63.2 kg    Examination: General: acute on chronic older female, sitting up in ICU bed s/p crani, no distress  HENT: Normocephalic, moist mucous membrane. Lungs: Clear to auscultation bilaterally. Cardiovascular: Heart rate regular in rate and rhythm.  No murmurs appreciated. Abdomen: bs, soft  Neuro: Following commands.  Alert and oriented x 3.  Moving all 4  extremities with strength 5/5.  Resolved problem list   Assessment and Plan  Right Craniotomy for SDH Evacuation - 8/31  Hx of Lethargy/Catatonia  Delirium 8/29 CT of maxillofacial, head, and C-spine- acute subdural hemorrhage along the right cerebral convexity, measuring up to 8 mm in thickness with mass effect and with 8 mm leftward midline shift, acute subdural hemorrhage also present along the right aspect of the falx, measuring up to 5 mm in thickness, and a Epidural hematoma at  the craniocervical junction and within the cervical spinal canal, 4mm. MRI showing small dorsal epidural hematoma located between CI vertebrae and occiput. Repeat CT head 9/2 with slight worsening of SDH from 4 mm to 7 mm.  Repeat CT head 9/3 stable SDH.  Both JP drain and subdural drain have been removed. - SBP goal < 140  - On Cleviprex .  Increase blood pressure medications today.  Now on Coreg  and ARB. - Holding anticoag per neurosurg recs. DVT prophylaxis can be resumed on 9/6. - Continue Keppra  BID  - Delirium on 9/2.  If occurs again we will consider atypical antipsychotics at night.  Delirium precautions.  Hyponatremia  Serum osmolarity, urine sodium, urine urea  and urine creatinine. Monitor.  HTN  P:  SBP goal < 140 as above Start Cleviprex  gtt  See above.  GERD P: P.o. Protonix .  Anxiety  Depression  P: Supportive care  Continue Effexor  and Lamictal   For   Labs   CBC: Recent Labs  Lab 03/09/24 1717 03/11/24 2338 03/12/24 0114 03/12/24 0419 03/13/24 0448 03/14/24 0856  WBC 6.8  --  7.1 8.3 9.0 9.1  NEUTROABS 5.2  --   --  7.3  --   --   HGB 11.6* 7.5* 9.7* 10.0* 8.8* 9.8*  HCT 32.2* 22.0* 27.4* 28.0* 25.5* 27.9*  MCV 91.5  --  93.2 93.3 94.8 93.6  PLT 204  --  176 178 185 244    Basic Metabolic Panel: Recent Labs  Lab 03/09/24 1717 03/11/24 2338 03/12/24 0419 03/13/24 0448 03/14/24 0811 03/14/24 0913  NA 132* 125* 128* 129* SPECIMEN CONTAMINATED, UNABLE TO PERFORM TEST(S). 128*  K 4.5 3.1* 3.4* 3.3* SPECIMEN CONTAMINATED, UNABLE TO PERFORM TEST(S). 3.7  CL 98  --  99 101 SPECIMEN CONTAMINATED, UNABLE TO PERFORM TEST(S). 99  CO2 23  --  21* 19* SPECIMEN CONTAMINATED, UNABLE TO PERFORM TEST(S). 19*  GLUCOSE 100*  --  137* 133* SPECIMEN CONTAMINATED, UNABLE TO PERFORM TEST(S). 114*  BUN 9  --  9 10 SPECIMEN CONTAMINATED, UNABLE TO PERFORM TEST(S). 9  CREATININE 0.87  --  0.65 0.65 SPECIMEN CONTAMINATED, UNABLE TO PERFORM TEST(S). 0.64  CALCIUM   9.3  --  8.0* 7.8* SPECIMEN CONTAMINATED, UNABLE TO PERFORM TEST(S). 7.8*   GFR: Estimated Creatinine Clearance: 54.9 mL/min (by C-G formula based on SCr of 0.64 mg/dL). Recent Labs  Lab 03/12/24 0114 03/12/24 0419 03/13/24 0448 03/14/24 0856  WBC 7.1 8.3 9.0 9.1    Liver Function Tests: No results for input(s): AST, ALT, ALKPHOS, BILITOT, PROT, ALBUMIN in the last 168 hours. No results for input(s): LIPASE, AMYLASE in the last 168 hours. No results for input(s): AMMONIA in the last 168 hours.  ABG    Component Value Date/Time   PHART 7.333 (L) 03/11/2024 2338   PCO2ART 36.2 03/11/2024 2338   PO2ART 220 (H) 03/11/2024 2338   HCO3 19.6 (L) 03/11/2024 2338   TCO2 21 (L) 03/11/2024 2338   ACIDBASEDEF 6.0 (H) 03/11/2024 2338   O2SAT 100 03/11/2024 2338  Coagulation Profile: No results for input(s): INR, PROTIME in the last 168 hours.  Cardiac Enzymes: No results for input(s): CKTOTAL, CKMB, CKMBINDEX, TROPONINI in the last 168 hours.  HbA1C: Hgb A1c MFr Bld  Date/Time Value Ref Range Status  10/17/2021 06:21 AM 5.1 4.8 - 5.6 % Final    Comment:    (NOTE) Pre diabetes:          5.7%-6.4%  Diabetes:              >6.4%  Glycemic control for   <7.0% adults with diabetes   09/09/2021 04:44 PM 4.8 4.8 - 5.6 % Final    Comment:    (NOTE) Pre diabetes:          5.7%-6.4%  Diabetes:              >6.4%  Glycemic control for   <7.0% adults with diabetes     CBG: Recent Labs  Lab 03/13/24 0741 03/13/24 1127 03/13/24 1519 03/13/24 2325 03/14/24 0307  GLUCAP 132* 140* 148* 118* 106*    Review of Systems:   See HPI   Past Medical History:  She,  has a past medical history of Allergy, Anxiety, Arrhythmia, Asthma, Bronchitis, Depression, Family history of anesthesia complication, GERD (gastroesophageal reflux disease), Goiter (03/17/2012), Heartburn, Herpes, Hypertension, Seizures (HCC), and Urinary tract infection.   Surgical  History:   Past Surgical History:  Procedure Laterality Date   CESAREAN SECTION     x 2   COLONOSCOPY     CRANIOTOMY Right 03/11/2024   Procedure: CRANIOTOMY HEMATOMA EVACUATION SUBDURAL;  Surgeon: Darnella Dorn SAUNDERS, MD;  Location: [facility] OR;  Service: Neurosurgery;  Laterality: Right;   ELBOW SURGERY     right   KNEE ARTHROSCOPY     right   PILONIDAL CYST EXCISION     spinal cystectomy     where pilonidal cyst was   THYROIDECTOMY  04/07/2012   Procedure: THYROIDECTOMY;  Surgeon: Sherlean JINNY Laughter, MD;  Location: [facility] OR;  Service: General;  Laterality: N/A;        Social History:   reports that she quit smoking about 24 years ago. Her smoking use included cigarettes. She has never used smokeless tobacco. She reports that she does not drink alcohol and does not use drugs.   Family History:  Her family history includes Atrial fibrillation in her sister; Cervical cancer in her mother; Cirrhosis in her brother; Fibromyalgia in her sister; Heart disease in her father; Heart failure in her father; Hypertension in her brother and father; Irritable bowel syndrome in her brother; Prostate cancer in her brother; Seizures in her brother and father. There is no history of Breast cancer.   Allergies Allergies  Allergen Reactions   Amlodipine  Other (See Comments)    Flushing, tingling in face, sore throat  (Norvasc )   Cyclobenzaprine Hcl Other (See Comments)    Seizures    Aripiprazole Other (See Comments)   Cymbalta  [Duloxetine  Hcl] Other (See Comments)    Made the depression worse   Tape Rash    Blisters     Home Medications  Prior to Admission medications   Medication Sig Start Date End Date Taking? Authorizing Provider  acyclovir  (ZOVIRAX ) 400 MG tablet Take 400 mg by mouth 3 (three) times daily as needed (Flare ups). 01/09/24  Yes [provider]  busPIRone  (BUSPAR ) 7.5 MG tablet Take 7.5 mg by mouth 2 (two) times daily.   Yes [provider]  lamoTRIgine   (LAMICTAL ) 150 MG tablet  Take 150 mg by mouth daily.   Yes [provider]  levothyroxine  (SYNTHROID ) 100 MCG tablet Take 1 tablet (100 mcg total) by mouth daily. 10/10/21  Yes Von Bellis, MD  metoprolol  succinate (TOPROL -XL) 25 MG 24 hr tablet Take 25 mg by mouth daily.   Yes [provider]  olmesartan (BENICAR) 20 MG tablet Take 20 mg by mouth daily. 01/07/24  Yes [provider]  risperiDONE  (RISPERDAL ) 3 MG tablet Take 3 mg by mouth at bedtime. 03/06/24  Yes [provider]  traZODone  (DESYREL ) 50 MG tablet Take 50 mg by mouth at bedtime. 03/06/24  Yes [provider]  venlafaxine  XR (EFFEXOR -XR) 150 MG 24 hr capsule Take 300 mg by mouth daily.   Yes [provider]     CRITICAL CARE Performed by: Sammi JONETTA Fredericks.     Total critical care time: 31 minutes   Critical care time was exclusive of separately billable procedures and treating other patients.   Critical care was necessary to treat or prevent imminent or life-threatening deterioration.   Critical care was time spent personally by me on the following activities: development of treatment plan with patient and/or surrogate as well as nursing, discussions with consultants, evaluation of patient's response to treatment, examination of patient, obtaining history from patient or surrogate, ordering and performing treatments and interventions, ordering and review of laboratory studies, ordering and review of radiographic studies, pulse oximetry, re-evaluation of patient's condition and participation in multidisciplinary rounds.  Sammi JONETTA Fredericks, MD Pulmonary, Critical Care and Sleep Attending.  Pager: [phone]  03/14/2024, 12:31 PM

## 2024-03-14 NOTE — Progress Notes (Signed)
 EEG complete - results pending

## 2024-03-14 NOTE — Evaluation (Signed)
 Occupational Therapy Evaluation Patient Details Name: Colleen [name] MRN: [id number] DOB: August 23, 1950 Today's Date: 03/14/2024   History of Present Illness   Colleen [name] is a 73 yo female presenting to ED 8/29 after a fall with headache, neck pain, and nausea. CT shows R scalp/periorbital hematoma, acute SDH along the R cerebral convexity measuring 8 mm thickness with mass effect and 8 mm leftward midline shift as well as an acute SDH along the R aspect of the falx measuring up to 5 mm in thickness, and an epidural hematoma at the craniocervical junction. MRI shows small dorsal epidural hematoma between C1 vertebrae and occiput. Worsening neurostatus 8/31, taken for R craniotomy for subdural hematoma evacuation. PMH includes HTN, asthma, GERD, anxiety/depression, thyroid  garter s/p total thyroidectomy 2013     Clinical Impressions PTA, pt lived alone and reports being independent in ADL and IADL. Upon eval, pt presents with decreased cognition, sequencing, blurred vision, decreased speed of saccadic eye movements as well as additional eye shifts, decreased safety, awareness, and balance. Pt needing min A +2 for basic transfers, mod A for toileting and LB ADL. Pt with difficulty following multistep commands throughout session, but follows one step commands given increased time. Will continue to follow acutely. Due to significant functional decline, recommending intensive multidisciplinary rehabilitation >3 hours/day to optimize safety and independence in ADL.       If plan is discharge home, recommend the following:   A lot of help with bathing/dressing/bathroom;A lot of help with walking and/or transfers;Two people to help with walking and/or transfers;Assistance with cooking/housework;Assist for transportation;Help with stairs or ramp for entrance;Direct supervision/assist for financial management;Direct supervision/assist for medications management     Functional Status Assessment    Patient has had a recent decline in their functional status and demonstrates the ability to make significant improvements in function in a reasonable and predictable amount of time.     Equipment Recommendations   Other (comment) (defer)     Recommendations for Other Services   Rehab consult;Speech consult (cog)     Precautions/Restrictions   Precautions Precautions: Fall Precaution/Restrictions Comments: SBP < 140, aline Restrictions Weight Bearing Restrictions Per Provider Order: No     Mobility Bed Mobility Overal bed mobility: Needs Assistance Bed Mobility: Supine to Sit     Supine to sit: Contact guard     General bed mobility comments: Step by step cueing    Transfers Overall transfer level: Needs assistance Equipment used: None Transfers: Sit to/from Stand, Bed to chair/wheelchair/BSC Sit to Stand: Min assist, +2 safety/equipment     Step pivot transfers: Min assist, +2 safety/equipment     General transfer comment: MinA to rise from edge of bed and steady, mildly impulsive pivoting to [facility], stood several times for peri care (pt performed and then OT completed). Steps over to chair from [facility]      Balance Overall balance assessment: Needs assistance Sitting-balance support: Feet supported Sitting balance-Leahy Scale: Fair     Standing balance support: No upper extremity supported, During functional activity Standing balance-Leahy Scale: Poor Standing balance comment: Posterior LOB with no external support                           ADL either performed or assessed with clinical judgement   ADL Overall ADL's : Needs assistance/impaired Eating/Feeding: Set up;Sitting   Grooming: Set up;Contact guard assist;Sitting   Upper Body Bathing: Contact guard assist;Sitting   Lower Body Bathing: Moderate assistance;Sit to/from  stand   Upper Body Dressing : Contact guard assist;Sitting   Lower Body Dressing: Moderate assistance;Sit to/from  stand   Toilet Transfer: Minimal assistance;+2 for safety/equipment;Stand-pivot;BSC/3in1   Toileting- Clothing Manipulation and Hygiene: Moderate assistance;Sit to/from stand Toileting - Clothing Manipulation Details (indicate cue type and reason): min A balance, min A thoroughness, needing to STS several times for completion of task     Functional mobility during ADLs: Minimal assistance;+2 for safety/equipment;+2 for physical assistance       Vision Baseline Vision/History: 1 Wears glasses Ability to See in Adequate Light: 0 Adequate Patient Visual Report: Blurring of vision Vision Assessment?: Vision impaired- to be further tested in functional context;Yes Eye Alignment: Within Functional Limits Ocular Range of Motion: Within Functional Limits Tracking/Visual Pursuits: Able to track stimulus in all quads without difficulty Saccades: Additional eye shifts occurred during testing Convergence: Impaired (comment) (not fully converging with tracking therapist finger toward nose) Visual Fields: No apparent deficits     Perception         Praxis         Pertinent Vitals/Pain Pain Assessment Pain Assessment: No/denies pain     Extremity/Trunk Assessment Upper Extremity Assessment Upper Extremity Assessment: Left hand dominant;Generalized weakness (L hand not fully assessed due to art line. increased time for motor planning and attention to task with some testing bilaterally)   Lower Extremity Assessment Lower Extremity Assessment: RLE deficits/detail;LLE deficits/detail RLE Deficits / Details: Strength 5/5 LLE Deficits / Details: Strength 5/5   Cervical / Trunk Assessment Cervical / Trunk Assessment: Normal   Communication Communication Communication: Other (comment) (word finding difficulties)   Cognition Arousal: Alert Behavior During Therapy: Flat affect Cognition: Cognition impaired     Awareness: Intellectual awareness intact, Online awareness impaired Memory  impairment (select all impairments): Short-term memory, Working [employer] fast streamer, Conservation officer, historic buildings Attention impairment (select first level of impairment): Sustained attention Executive functioning impairment (select all impairments): Organization, Sequencing, Problem solving OT - Cognition Comments: needing max cues for following simple 3 step commands during motor assessment, can follow very basic 2 step commands inconsistently. Pt follows all one step commands increased time. Cues for safety during mobility.                 Following commands: Intact       Cueing  General Comments   Cueing Techniques: Verbal cues      Exercises     Shoulder Instructions      Home Living Family/patient expects to be discharged to:: Private residence Living Arrangements: Alone Available Help at Discharge: Family;Available PRN/intermittently Type of Home: House Home Access: Stairs to enter [employer] of Steps: 2   Home Layout: One level     Bathroom Shower/Tub: Chief Strategy Officer: Standard     Home Equipment: Grab bars - toilet;Grab bars - tub/shower          Prior Functioning/Environment Prior Level of Function : Independent/Modified Independent;Driving                    OT Problem List: Decreased strength;Decreased activity tolerance;Impaired balance (sitting and/or standing);Impaired vision/perception;Decreased coordination;Decreased cognition;Decreased safety awareness;Decreased knowledge of use of DME or AE   OT Treatment/Interventions: Self-care/ADL training;Therapeutic exercise;DME and/or AE instruction;Therapeutic activities;Patient/family education;Balance training;Cognitive remediation/compensation;Visual/perceptual remediation/compensation      OT Goals(Current goals can be found in the care plan section)   Acute Rehab OT Goals Patient Stated Goal: get better OT Goal Formulation: With patient Time For Goal Achievement:  03/28/24 Potential to  Achieve Goals: Good   OT Frequency:  Min 2X/week    Co-evaluation PT/OT/SLP Co-Evaluation/Treatment: Yes Reason for Co-Treatment: Complexity of the patient's impairments (multi-system involvement);For patient/therapist safety;To address functional/ADL transfers PT goals addressed during session: Mobility/safety with mobility OT goals addressed during session: ADL's and self-care      AM-PAC OT 6 Clicks Daily Activity     Outcome Measure Help from another person eating meals?: A Little Help from another person taking care of personal grooming?: A Little Help from another person toileting, which includes using toliet, bedpan, or urinal?: A Lot Help from another person bathing (including washing, rinsing, drying)?: A Lot Help from another person to put on and taking off regular upper body clothing?: A Little Help from another person to put on and taking off regular lower body clothing?: A Lot 6 Click Score: 15   End of Session Equipment Utilized During Treatment: Gait belt Nurse Communication: Mobility status  Activity Tolerance: Patient tolerated treatment well Patient left: in chair;with call bell/phone within reach;with chair alarm set  OT Visit Diagnosis: Unsteadiness on feet (R26.81);Muscle weakness (generalized) (M62.81);Other symptoms and signs involving cognitive function;Pain                Time: 8477-8447 OT Time Calculation (min): 30 min Charges:  OT General Charges $OT Visit: 1 Visit OT Evaluation $OT Eval Moderate Complexity: 1 Mod  Elma JONETTA Lebron FREDERICK, OTR/L [facility] Acute Rehabilitation Office: [phone]   Elma JONETTA Lebron 03/14/2024, 5:33 PM

## 2024-03-14 NOTE — Progress Notes (Signed)
 Speech Language Pathology Treatment: Dysphagia;Cognitive-Linguistic  Patient Details Name: Colleen [name] MRN: [id number] DOB: Apr 20, 1951 Today's Date: 03/14/2024 Time: 8773-8757 SLP Time Calculation (min) (ACUTE ONLY): 16 min  Assessment / Plan / Recommendation Clinical Impression  Pt is alert, sitting upright and eating a cookie. No signs clinically concerning for aspiration were observed with cup or straw sips of thin liquids. Mastication remains mildly prolonged, which is suspected to be secondary to cognitive factors. Pt needs Mod cueing to initiate asking for assistance and to problem solve during functional tasks. While she can identify problems, she needs cueing to work through their solution. She reports difficulty staying oriented to time throughout the day but could accurately state the time when her attention was directed to the clock. She intermittently requires cueing to attend to her L side so carryover of this strategy may be somewhat difficult because the clock is positioned to the left. Recommend she continue her current diet with supervision to provide cueing for attention and oral clearance as needed. SLP will continue following for both dysphagia and cognitive-linguistic goals, though do not anticipate the need for post-acute dysphagia f/u.    HPI HPI: Colleen [name] is a 73 yo female presenting to ED 8/29 after a fall with headache, neck pain, and nausea. CT shows R scalp/periorbital hematoma, acute SDH along the R cerebral convexity measuring 8 mm thickness with mass effect and 8 mm leftward midline shift as well as an acute SDH along the R aspect of the falx measuring up to 5 mm in thickness, and an epidural hematoma at the craniocervical junction. MRI shows small dorsal epidural hematoma between C1 vertebrae and occiput. Worsening neurostatus 8/31, taken for R craniotomy for subdural hematoma evacuation. PMH includes HTN, asthma, GERD, anxiety/depression, thyroid  garter s/p  total thyroidectomy 2013      SLP Plan  Continue with current plan of care          Recommendations  Diet recommendations: Regular;Thin liquid Liquids provided via: Cup;Straw Medication Administration: Whole meds with liquid Supervision: Staff to assist with self feeding;Full supervision/cueing for compensatory strategies Compensations: Minimize environmental distractions;Slow rate;Small sips/bites Postural Changes and/or Swallow Maneuvers: Seated upright 90 degrees                  Oral care BID   Frequent or constant Supervision/Assistance Cognitive communication deficit (R41.841);Dysphagia, unspecified (R13.10)     Continue with current plan of care     Damien Blumenthal, M.A., CCC-SLP Speech Language Pathology, Acute Rehabilitation Services  Secure Chat preferred [phone]   03/14/2024, 1:09 PM

## 2024-03-14 NOTE — Progress Notes (Signed)
 Assessment 73 y/o F w/ hx HTN who presented after GLF, found to have right convexity SDH and possible CCJ EDH. Stable on repeat imaging yet patient was exhibiting lethargy. Underwent R craniotomy for SDH evacuation on 8/31  LOS: 2 days    Plan: Subdural drain to gravity - 0cc. Dc today EEG running - await read SBP<140 AAT DAT No collar or intervention for CCJ findings Ok for DVT chemoppx on 9/6  Subjective: Patient reportedly suffered what sounds like a non epileptic seizure yesterday. CT head showed no change. EEG was ordered.   Objective: Vital signs in last 24 hours: Temp:  [98.1 F (36.7 C)-98.9 F (37.2 C)] 98.7 F (37.1 C) (09/03 0400) Pulse Rate:  [65-98] 86 (09/03 0700) Resp:  [16-31] 18 (09/03 0700) BP: (96-156)/(51-83) 156/71 (09/03 0600) SpO2:  [87 %-99 %] 93 % (09/03 0700) Arterial Line BP: (123-167)/(40-64) 150/60 (09/03 0700)  Intake/Output from previous day: 09/02 0701 - 09/03 0700 In: 780.5 [P.O.:240; I.V.:540.5] Out: 225 [Urine:225] Intake/Output this shift: No intake/output data recorded.  Exam: GCS 4E 4V 34M Oriented to name and place. Slight confusion with conversation Conjugate gaze, PERRL Grimace symmetric Commands BUE and BLE full strength Cranial incision c/d/I, staples Drains present  Lab Results: Recent Labs    03/12/24 0419 03/13/24 0448  WBC 8.3 9.0  HGB 10.0* 8.8*  HCT 28.0* 25.5*  PLT 178 185   BMET Recent Labs    03/12/24 0419 03/13/24 0448  NA 128* 129*  K 3.4* 3.3*  CL 99 101  CO2 21* 19*  GLUCOSE 137* 133*  BUN 9 10  CREATININE 0.65 0.65  CALCIUM  8.0* 7.8*    Studies/Results: CT HEAD WO CONTRAST ( ) Result Date: 03/14/2024 EXAM: CT HEAD WITHOUT CONTRAST 03/14/2024 04:52:27 AM TECHNIQUE: CT of the head was performed without the administration of intravenous contrast. Automated exposure control, iterative reconstruction, and/or weight based adjustment of the mA/kV was utilized to reduce the radiation dose to as  low as reasonably achievable. COMPARISON: CT of the head dated 03/13/2024. CLINICAL HISTORY: Head trauma, moderate-severe. F/u head ct- rad recommended FINDINGS: BRAIN AND VENTRICLES: Status post craniotomy for evacuation of right-sided subdural hematoma. A subdural drain remains in place. There is residual subdural hemorrhage and intracranial air, which appears similar to the prior exam. There is persistent shift of the midline structures to the left by about 8 mm, as before. ORBITS: No acute abnormality. SINUSES: No acute abnormality. SOFT TISSUES AND SKULL: No acute soft tissue abnormality. No skull fracture. IMPRESSION: 1. Status post right-sided craniotomy for evacuation of subdural hematoma with residual subdural hemorrhage, intracranial air, and subdural drain in place, similar to prior exam. 2. Persistent leftward midline shift of approximately 8 mm, as before. Electronically signed by: Evalene Coho MD 03/14/2024 05:12 AM EDT RP Workstation: [id number]   CT HEAD WO CONTRAST ( ) Result Date: 03/13/2024 CLINICAL DATA:  Provided history: Neuro deficit, acute, stroke suspected. EXAM: CT HEAD WITHOUT CONTRAST TECHNIQUE: Contiguous axial images were obtained from the base of the skull through the vertex without intravenous contrast. RADIATION DOSE REDUCTION: This exam was performed according to the departmental dose-optimization program which includes automated exposure control, adjustment of the mA and/or kV according to patient size and/or use of iterative reconstruction technique. COMPARISON:  Head CT 03/12/2024. FINDINGS: Brain: Interval evolution of postoperative changes from prior right-sided craniotomy and right subdural hematoma evacuation a subdural drain remains in place. The subdural hematoma along the right cerebral convexity has increased in size since 03/12/2024, now measuring up  to 7 mm in thickness (previously 5 mm). Progressive mass effect, now with 8 mm leftward midline shift (previously  6 mm). Persistent although decreased small volume pneumocephalus deep to the cranioplasty. Only trace residual subdural hemorrhage along the falx. No demarcated cortical infarct. No evidence of an intracranial mass. Vascular: No hyperdense vessel.  Atherosclerotic calcifications. Skull: Right-sided cranioplasty. Sinuses/Orbits: No mass or acute finding within the imaged orbits. No significant phlegm a tori paranasal sinus disease. 5 mm left frontal sinus osteoma. Other: Scalp staples, subcutaneous gas and soft tissue swelling overlying the cranioplasty. Right periorbital hematoma. Impression #1 will be called to the ordering clinician or representative by the Radiologist Assistant, and communication documented in the PACS or [employer]. IMPRESSION: 1. Interval increase in size of a subdural hematoma along the right cerebral convexity, now measuring up to 7 mm in thickness (previously 5 mm). Progressive mass effect, now with 8 mm leftward midline shift (previously 6 mm). 2. Only trace residual subdural hemorrhage along the falx. Electronically Signed   By: Rockey Childs D.O.   On: 03/13/2024 18:06      Dorn JONELLE Glade 03/14/2024, 7:12 AM

## 2024-03-15 ENCOUNTER — Inpatient Hospital Stay (HOSPITAL_COMMUNITY)

## 2024-03-15 LAB — CBC
HCT: 27.4 % — ABNORMAL LOW (ref 36.0–46.0)
Hemoglobin: 9.3 g/dL — ABNORMAL LOW (ref 12.0–15.0)
MCH: 33 pg (ref 26.0–34.0)
MCHC: 33.9 g/dL (ref 30.0–36.0)
MCV: 97.2 fL (ref 80.0–100.0)
Platelets: 200 K/uL (ref 150–400)
RBC: 2.82 MIL/uL — ABNORMAL LOW (ref 3.87–5.11)
RDW: 12.5 % (ref 11.5–15.5)
WBC: 5.9 K/uL (ref 4.0–10.5)
nRBC: 0 % (ref 0.0–0.2)

## 2024-03-15 LAB — BASIC METABOLIC PANEL WITH GFR
Anion gap: 12 (ref 5–15)
Anion gap: 9 (ref 5–15)
BUN: 6 mg/dL — ABNORMAL LOW (ref 8–23)
BUN: 9 mg/dL (ref 8–23)
CO2: 20 mmol/L — ABNORMAL LOW (ref 22–32)
CO2: 22 mmol/L (ref 22–32)
Calcium: 8 mg/dL — ABNORMAL LOW (ref 8.9–10.3)
Calcium: 8 mg/dL — ABNORMAL LOW (ref 8.9–10.3)
Chloride: 103 mmol/L (ref 98–111)
Chloride: 102 mmol/L (ref 98–111)
Creatinine, Ser: 0.68 mg/dL (ref 0.44–1.00)
Creatinine, Ser: 0.78 mg/dL (ref 0.44–1.00)
GFR, Estimated: >60 mL/min (ref >60)
GFR, Estimated: >60 mL/min (ref >60)
Glucose, Bld: 98 mg/dL (ref 70–99)
Glucose, Bld: 106 mg/dL — ABNORMAL HIGH (ref 70–99)
Potassium: 3.5 mmol/L (ref 3.5–5.1)
Potassium: 3.8 mmol/L (ref 3.5–5.1)
Sodium: 135 mmol/L (ref 135–145)
Sodium: 133 mmol/L — ABNORMAL LOW (ref 135–145)

## 2024-03-15 LAB — TYPE AND SCREEN
ABO/RH(D): O POS
Antibody Screen: POSITIVE
Unit division: 0
Unit division: 0

## 2024-03-15 LAB — BPAM RBC
Blood Product Expiration Date: 202510052359
Blood Product Expiration Date: 202510052359
Unit Type and Rh: 5100
Unit Type and Rh: 5100

## 2024-03-15 LAB — UREA NITROGEN, URINE: Urea Nitrogen, Ur: 264 mg/dL

## 2024-03-15 LAB — SODIUM: Sodium: 135 mmol/L (ref 135–145)

## 2024-03-15 MED ORDER — CARVEDILOL 12.5 MG PO TABS: 12.5000 mg | ORAL_TABLET | Freq: Two times a day (BID) | ORAL | Status: DC

## 2024-03-15 MED ORDER — LEVETIRACETAM 500 MG PO TABS
500.0000 mg | ORAL_TABLET | Freq: Two times a day (BID) | ORAL | Status: DC
  Administered 2024-03-15: 500 mg via ORAL
  Filled 2024-03-15: qty 1

## 2024-03-15 MED ORDER — ACETAMINOPHEN 650 MG RE SUPP: 650.0000 mg | RECTAL | Status: DC | PRN

## 2024-03-15 MED ORDER — LEVETIRACETAM 500 MG PO TABS: 500.0000 mg | ORAL_TABLET | Freq: Two times a day (BID) | ORAL | Status: DC

## 2024-03-15 MED ORDER — HYDRALAZINE HCL 50 MG PO TABS: 100.0000 mg | ORAL_TABLET | Freq: Three times a day (TID) | ORAL | Status: DC

## 2024-03-15 MED ORDER — HYDRALAZINE HCL 50 MG PO TABS
100.0000 mg | ORAL_TABLET | Freq: Three times a day (TID) | ORAL | Status: DC
  Administered 2024-03-15: 100 mg via ORAL
  Filled 2024-03-15 (×2): qty 2

## 2024-03-15 MED ORDER — CARVEDILOL 3.125 MG PO TABS
6.2500 mg | ORAL_TABLET | Freq: Two times a day (BID) | ORAL | Status: AC
  Administered 2024-03-15: 6.25 mg via ORAL
  Filled 2024-03-15: qty 2

## 2024-03-15 MED ORDER — SODIUM CHLORIDE 3 % IV SOLN
INTRAVENOUS | Status: DC
  Filled 2024-03-15 (×9): qty 500

## 2024-03-15 MED ORDER — IRBESARTAN 150 MG PO TABS: 300.0000 mg | ORAL_TABLET | Freq: Every day | ORAL | Status: DC

## 2024-03-15 MED ORDER — FENTANYL CITRATE PF 50 MCG/ML IJ SOSY
200.0000 ug | PREFILLED_SYRINGE | Freq: Once | INTRAMUSCULAR | Status: AC
  Administered 2024-03-15: 50 ug via INTRAVENOUS
  Filled 2024-03-15: qty 4

## 2024-03-15 MED ORDER — LEVOTHYROXINE SODIUM 100 MCG PO TABS
100.0000 ug | ORAL_TABLET | Freq: Every day | ORAL | Status: DC
  Administered 2024-03-16: 100 ug via ORAL
  Filled 2024-03-15: qty 1

## 2024-03-15 MED ORDER — TRAZODONE HCL 50 MG PO TABS: 50.0000 mg | ORAL_TABLET | Freq: Every day | ORAL | Status: DC

## 2024-03-15 MED ORDER — LEVOTHYROXINE SODIUM 100 MCG PO TABS: 100.0000 ug | ORAL_TABLET | Freq: Every day | ORAL | Status: DC

## 2024-03-15 MED ORDER — CEFAZOLIN SODIUM-DEXTROSE 1-4 GM/50ML-% IV SOLN
1.0000 g | Freq: Once | INTRAVENOUS | Status: AC
  Administered 2024-03-15: 1 g via INTRAVENOUS
  Filled 2024-03-15: qty 50

## 2024-03-15 MED ORDER — HYDRALAZINE HCL 50 MG PO TABS
100.0000 mg | ORAL_TABLET | Freq: Three times a day (TID) | ORAL | Status: DC
  Administered 2024-03-15 (×2): 100 mg via ORAL
  Filled 2024-03-15 (×2): qty 2

## 2024-03-15 MED ORDER — ZINC OXIDE 40 % EX OINT
TOPICAL_OINTMENT | Freq: Two times a day (BID) | CUTANEOUS | Status: DC
  Administered 2024-03-15 – 2024-03-19 (×2): 1 via TOPICAL
  Filled 2024-03-15 (×2): qty 57

## 2024-03-15 MED ORDER — LAMOTRIGINE 25 MG PO TABS: 150.0000 mg | ORAL_TABLET | Freq: Every day | ORAL | Status: DC

## 2024-03-15 MED ORDER — CARVEDILOL 12.5 MG PO TABS
12.5000 mg | ORAL_TABLET | Freq: Two times a day (BID) | ORAL | Status: DC
  Administered 2024-03-15: 12.5 mg via ORAL
  Filled 2024-03-15: qty 1

## 2024-03-15 MED ORDER — ACETAMINOPHEN 325 MG PO TABS: 650.0000 mg | ORAL_TABLET | ORAL | Status: DC | PRN

## 2024-03-15 MED ORDER — LIDOCAINE-EPINEPHRINE 1 %-1:100000 IJ SOLN
10.0000 mL | Freq: Once | INTRAMUSCULAR | Status: AC
  Administered 2024-03-15: 10 mL
  Filled 2024-03-15: qty 1

## 2024-03-15 MED ORDER — RISPERIDONE 3 MG PO TABS
3.0000 mg | ORAL_TABLET | Freq: Every day | ORAL | Status: DC
  Filled 2024-03-15: qty 1

## 2024-03-15 MED ORDER — PANTOPRAZOLE SODIUM 40 MG PO TBEC: 40.0000 mg | DELAYED_RELEASE_TABLET | Freq: Every day | ORAL | Status: DC

## 2024-03-15 MED ORDER — MIDAZOLAM HCL 2 MG/2ML IJ SOLN
5.0000 mg | Freq: Once | INTRAMUSCULAR | Status: AC
  Administered 2024-03-15: 1 mg via INTRAVENOUS
  Filled 2024-03-15: qty 6

## 2024-03-15 MED ORDER — PANTOPRAZOLE SODIUM 40 MG IV SOLR
40.0000 mg | Freq: Every day | INTRAVENOUS | Status: DC
  Administered 2024-03-16 – 2024-03-19 (×4): 40 mg via INTRAVENOUS
  Filled 2024-03-15 (×4): qty 10

## 2024-03-15 MED ORDER — POTASSIUM CHLORIDE CRYS ER 20 MEQ PO TBCR
40.0000 meq | EXTENDED_RELEASE_TABLET | Freq: Once | ORAL | Status: AC
  Administered 2024-03-15: 40 meq via ORAL
  Filled 2024-03-15: qty 2

## 2024-03-15 NOTE — Progress Notes (Addendum)
 Assessment 73 y/o F w/ hx HTN who presented after GLF, found to have right convexity SDH and possible CCJ EDH. Stable on repeat imaging yet patient was exhibiting lethargy. Underwent R craniotomy for SDH evacuation on 8/31  LOS: 3 days    Plan: Keep SBP<140 AAT DAT Appreciate CCS assistance for management of medical comorbidities No collar or intervention for CCJ findings Ok for DVT chemoppx on 9/6 Will plan for transfer to 4[facility] once cleviprex  off Rehab consult placed  Subjective: EEG negative. Rehab consult placed. Pt up a lot at night - not getting much sleep. +BM. Urinating ok  Objective: Vital signs in last 24 hours: Temp:  [98.2 F (36.8 C)-99.3 F (37.4 C)] 99.3 F (37.4 C) (09/04 0800) Pulse Rate:  [65-99] 98 (09/04 0800) Resp:  [10-30] 17 (09/04 0800) BP: (101-162)/(47-118) 162/65 (09/04 0800) SpO2:  [85 %-100 %] 97 % (09/04 0800) Arterial Line BP: (93-158)/(48-67) 146/67 (09/04 0800)  Intake/Output from previous day: 09/03 0701 - 09/04 0700 In: 1139.9 [P.O.:680; I.V.:459.9] Out: -  Intake/Output this shift: Total I/O In: 20 [I.V.:20] Out: -   Exam: GCS 4E 4V 36M Oriented to name and place. Slight confusion with conversation Conjugate gaze, PERRL Grimace symmetric Commands BUE and BLE full strength Cranial incision c/d/I, staples  Lab Results: Recent Labs    03/14/24 0856 03/15/24 0533  WBC 9.1 5.9  HGB 9.8* 9.3*  HCT 27.9* 27.4*  PLT 244 200   BMET Recent Labs    03/14/24 0913 03/15/24 0533  NA 128* 135  K 3.7 3.5  CL 99 103  CO2 19* 20*  GLUCOSE 114* 98  BUN 9 6*  CREATININE 0.64 0.68  CALCIUM  7.8* 8.0*    Studies/Results: EEG adult Result Date: 03/14/2024 Shelton Arlin KIDD, MD     03/14/2024 12:06 PM Patient Name: Colleen [name] MRN: [id number] Epilepsy Attending: Arlin KIDD Shelton Referring Physician/Provider: Theodoro Lakes, MD Date: 03/14/2024 Duration: 24.20 mins Patient history: 73 y/o F w/ hx HTN who presented after GLF, found to  have right convexity SDH and possible CCJ EDH. EEG to evaluate for seizure Level of alertness: Awake, asleep AEDs during EEG study: LEV, LTG Technical aspects: This EEG study was done with scalp electrodes positioned according to the 10-20 International system of electrode placement. Electrical activity was reviewed with band pass filter of 1-70Hz , sensitivity of 7 uV/mm, display speed of 80mm/sec with a 60Hz  notched filter applied as appropriate. EEG data were recorded continuously and digitally stored.  Video monitoring was available and reviewed as appropriate. Description: The posterior dominant rhythm consists of 8Hz  activity of moderate voltage (25-35 uV) seen predominantly in posterior head regions, symmetric and reactive to eye opening and eye closing. Sleep was characterized by vertex waves, sleep spindles (12 to 14 Hz), maximal frontocentral region.  EEG showed continuous generalized and lateralized right hemisphere 3 to 6 Hz theta-delta slowing. There is also overriding 13-15hz  fast activity in right frontal region consistent with breach artifact. Hyperventilation and photic stimulation were not performed.   ABNORMALITY - Breach artifact, right frontal region - Continuous slow, generalized and lateralized right hemisphere IMPRESSION: This study was suggestive of cortical dysfunction arising from right frontal region consistent with underlying craniotomy. Additionally there was cortical dysfunction in right hemisphere, likely secondary to underlying structural abnormality. Lastly there was mild to moderate diffuse encephalopathy. No seizures or epileptiform discharges were seen throughout the recording. Priyanka O Yadav   CT HEAD WO CONTRAST ( ) Result Date: 03/14/2024 EXAM: CT HEAD WITHOUT CONTRAST  03/14/2024 04:52:27 AM TECHNIQUE: CT of the head was performed without the administration of intravenous contrast. Automated exposure control, iterative reconstruction, and/or weight based adjustment of the  mA/kV was utilized to reduce the radiation dose to as low as reasonably achievable. COMPARISON: CT of the head dated 03/13/2024. CLINICAL HISTORY: Head trauma, moderate-severe. F/u head ct- rad recommended FINDINGS: BRAIN AND VENTRICLES: Status post craniotomy for evacuation of right-sided subdural hematoma. A subdural drain remains in place. There is residual subdural hemorrhage and intracranial air, which appears similar to the prior exam. There is persistent shift of the midline structures to the left by about 8 mm, as before. ORBITS: No acute abnormality. SINUSES: No acute abnormality. SOFT TISSUES AND SKULL: No acute soft tissue abnormality. No skull fracture. IMPRESSION: 1. Status post right-sided craniotomy for evacuation of subdural hematoma with residual subdural hemorrhage, intracranial air, and subdural drain in place, similar to prior exam. 2. Persistent leftward midline shift of approximately 8 mm, as before. Electronically signed by: Evalene Coho MD 03/14/2024 05:12 AM EDT RP Workstation: [id number]   CT HEAD WO CONTRAST ( ) Result Date: 03/13/2024 CLINICAL DATA:  Provided history: Neuro deficit, acute, stroke suspected. EXAM: CT HEAD WITHOUT CONTRAST TECHNIQUE: Contiguous axial images were obtained from the base of the skull through the vertex without intravenous contrast. RADIATION DOSE REDUCTION: This exam was performed according to the departmental dose-optimization program which includes automated exposure control, adjustment of the mA and/or kV according to patient size and/or use of iterative reconstruction technique. COMPARISON:  Head CT 03/12/2024. FINDINGS: Brain: Interval evolution of postoperative changes from prior right-sided craniotomy and right subdural hematoma evacuation a subdural drain remains in place. The subdural hematoma along the right cerebral convexity has increased in size since 03/12/2024, now measuring up to 7 mm in thickness (previously 5 mm). Progressive mass  effect, now with 8 mm leftward midline shift (previously 6 mm). Persistent although decreased small volume pneumocephalus deep to the cranioplasty. Only trace residual subdural hemorrhage along the falx. No demarcated cortical infarct. No evidence of an intracranial mass. Vascular: No hyperdense vessel.  Atherosclerotic calcifications. Skull: Right-sided cranioplasty. Sinuses/Orbits: No mass or acute finding within the imaged orbits. No significant phlegm a tori paranasal sinus disease. 5 mm left frontal sinus osteoma. Other: Scalp staples, subcutaneous gas and soft tissue swelling overlying the cranioplasty. Right periorbital hematoma. Impression #1 will be called to the ordering clinician or representative by the Radiologist Assistant, and communication documented in the PACS or [employer]. IMPRESSION: 1. Interval increase in size of a subdural hematoma along the right cerebral convexity, now measuring up to 7 mm in thickness (previously 5 mm). Progressive mass effect, now with 8 mm leftward midline shift (previously 6 mm). 2. Only trace residual subdural hemorrhage along the falx. Electronically Signed   By: Rockey Childs D.O.   On: 03/13/2024 18:06      Dorn JONELLE Glade 03/15/2024, 8:54 AM

## 2024-03-15 NOTE — Progress Notes (Signed)
 Physical Therapy Treatment Patient Details Name: Carleena Mires MRN: [id number] DOB: 1951/04/08 Today's Date: 03/15/2024   History of Present Illness Colleen [name] is a 73 yo female presenting to ED 8/29 after a fall with headache, neck pain, and nausea. CT shows R scalp/periorbital hematoma, acute SDH along the R cerebral convexity measuring 8 mm thickness with mass effect and 8 mm leftward midline shift as well as an acute SDH along the R aspect of the falx measuring up to 5 mm in thickness, and an epidural hematoma at the craniocervical junction. MRI shows small dorsal epidural hematoma between C1 vertebrae and occiput. Worsening neurostatus 8/31, taken for R craniotomy for subdural hematoma evacuation. PMH includes HTN, asthma, GERD, anxiety/depression, thyroid  garter s/p total thyroidectomy 2013    PT Comments  Pt progressing appropriately with therapy. Pt sleeping upon arrival, requiring increased assist for bed mobility due to decreased initiation. Pivoting onto bedside commode with min assist (+2 safety) and then to chair. From chair, walked an additional 12 ft with close chair follow. Pt demonstrates impaired cognition, standing balance, weakness, decreased endurance. Patient will benefit from intensive inpatient follow-up therapy, >3 hours/day.    If plan is discharge home, recommend the following: A little help with walking and/or transfers;A little help with bathing/dressing/bathroom;Assistance with cooking/housework;Help with stairs or ramp for entrance;Assist for transportation;Supervision due to cognitive status   Can travel by private vehicle        Equipment Recommendations  Other (comment) (TBA)    Recommendations for Other Services Rehab consult     Precautions / Restrictions Precautions Precautions: Fall Precaution/Restrictions Comments: SBP < 140, aline Restrictions Weight Bearing Restrictions Per Provider Order: No     Mobility  Bed Mobility Overal bed  mobility: Needs Assistance Bed Mobility: Supine to Sit     Supine to sit: Mod assist     General bed mobility comments: Increased assist needed due to decreased initiation by pt. Cues for reaching over with RUE for rail, assist to bring BLE's off edge of bed, pt elevating trunk to upright position    Transfers Overall transfer level: Needs assistance Equipment used: None Transfers: Sit to/from Stand, Bed to chair/wheelchair/BSC Sit to Stand: Min assist, +2 safety/equipment Stand pivot transfers: Min assist, +2 physical assistance         General transfer comment: MinA to rise from edge of bed, crouched posture, pivoting to R to [facility]. Assist to steady and fully guide hips onto surface    Ambulation/Gait Ambulation/Gait assistance: Min assist, +2 safety/equipment Gait Distance (Feet): 12 Feet Assistive device: None Gait Pattern/deviations: Step-through pattern, Decreased stride length, Trunk flexed Gait velocity: decreased Gait velocity interpretation: <1.8 ft/sec, indicate of risk for recurrent falls   General Gait Details: Dynamic instability, minA for balance, chair follow utilized   Optometrist     Tilt Bed    Modified Rankin (Stroke Patients Only)       Balance Overall balance assessment: Needs assistance Sitting-balance support: Feet supported Sitting balance-Leahy Scale: Fair     Standing balance support: No upper extremity supported, During functional activity Standing balance-Leahy Scale: Poor Standing balance comment: Posterior LOB with no external support                            Communication Communication Communication: Other (comment) (word finding difficulties)  Cognition Arousal: Alert Behavior During Therapy: Flat affect  PT - Cognitive impairments: Sequencing, Awareness, Initiation                       PT - Cognition Comments: Pt A&O x 3, not oriented to time, flat affect. Slow  processing Following commands: Intact      Cueing Cueing Techniques: Verbal cues  Exercises      General Comments        Pertinent Vitals/Pain Pain Assessment Pain Assessment: No/denies pain    Home Living                          Prior Function            PT Goals (current goals can now be found in the care plan section) Acute Rehab PT Goals Patient Stated Goal: did not state PT Goal Formulation: With patient Time For Goal Achievement: 03/28/24 Potential to Achieve Goals: Good Progress towards PT goals: Progressing toward goals    Frequency    Min 3X/week      PT Plan      Co-evaluation              AM-PAC PT 6 Clicks Mobility   Outcome Measure  Help needed turning from your back to your side while in a flat bed without using bedrails?: A Little Help needed moving from lying on your back to sitting on the side of a flat bed without using bedrails?: A Little Help needed moving to and from a bed to a chair (including a wheelchair)?: A Little Help needed standing up from a chair using your arms (e.g., wheelchair or bedside chair)?: A Little Help needed to walk in hospital room?: A Lot Help needed climbing 3-5 steps with a railing? : Total 6 Click Score: 15    End of Session Equipment Utilized During Treatment: Gait belt Activity Tolerance: Patient tolerated treatment well Patient left: in chair;with call bell/phone within reach;with chair alarm set Nurse Communication: Mobility status PT Visit Diagnosis: Unsteadiness on feet (R26.81);Difficulty in walking, not elsewhere classified (R26.2)     Time: 8865-8799 PT Time Calculation (min) (ACUTE ONLY): 26 min  Charges:    $Therapeutic Activity: 23-37 mins PT General Charges $$ ACUTE PT VISIT: 1 Visit                     Aleck Daring, PT, DPT Acute Rehabilitation Services Office [phone]    Alayne ONEIDA Daring 03/15/2024, 2:07 PM

## 2024-03-15 NOTE — Progress Notes (Signed)
 Pt was interactive all day with a family member. This early evening she has become lethargic again similar to before her craniotomy. CT head performed which shows stable right SDH and MLS although the basilar cisterns are fuller  Will proceed with emergent EVD

## 2024-03-15 NOTE — Progress Notes (Addendum)
  Inpatient Rehabilitation Admissions Coordinator   Met with patient and sister, Sharyne, at bedside for rehab assessment. We discussed goals and expectations of a possible CIR admit. Prior to admit patient lived alone, retired and active. Local daughter, Rollo and daughter, [name], in [place] . She states Rollo would be my contact. I have asked Alice to have Rollo to call me to discuss her rehab venue options.  Please call me with any questions.   Heron Leavell, RN, MSN Rehab Admissions Coordinator [phone]

## 2024-03-15 NOTE — Consult Note (Signed)
 NAME:  Colleen [name], MRN:  [id number], DOB:  [birth year], LOS: 3 ADMISSION DATE:  03/09/2024, CONSULTATION DATE:  03/12/24  REFERRING MD:  MD Malcolm CHIEF COMPLAINT:  Subdural Hematoma   History of Present Illness:  Pt is a 73 yr old female with significant pmhx of HTN, Asthma, GERD, lethargy/catanoia anxiety, and depression who presented to [facility] post fall with complaints of headache, neck pain, and nausea on 03/09/24. CT scans of maxillofacial, head, C-Spine were obtained revealed, right scalp/periorbital hematoma, acute subdural hemorrhage along the right cerebral convexity, measuring up to 8 mm in thickness with mass effect and with 8 mm leftward midline shift, acute subdural hemorrhage also present along the right aspect of the falx, measuring up to 5 mm in thickness, and a Epidural hematoma at the craniocervical junction and within the cervical spinal canal, 4mm. Patient transferred to [facility] for further neuro evaluation and MRI of cervical spine. MRI showing small dorsal epidural hematoma located between CI vertebrae and occiput.  On 8/31, patient having worsening neurostatus becoming more lethargic. Patient was given 6mg  of ativan  for history catatonia/lethargic with no improvement subsequently patient taken for a craniotomy for subdural hematoma evacuation.   Pertinent  Medical History   Past Medical History:  Diagnosis Date   Allergy    Anxiety    Arrhythmia    does not see cardiologist, sees pcp, Dr. 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  CT head showed worsening SDH.  Repeat CT head with stable SDH.  Discussed with neurosurgery, expected postop changes.  No surgical interventions.  EEG negative. 9/3: all drains are out.   Interim History / Subjective:  Alert and oriented x 3.  Denies any symptoms.  However still on Cleviprex .  Cleviprex  11.  Objective    Blood pressure (!) 144/64, pulse 86, temperature 99.3 F (37.4 C), temperature source Oral, resp. rate (!) 23, height 5' 4.02 (1.626 m), weight 63.2 kg, SpO2 96%.        Intake/Output Summary (Last 24 hours) at 03/15/2024 1058 Last data filed at 03/15/2024 1000 Gross per 24 hour  Intake 840.43 ml  Output --  Net 840.43 ml   Filed Weights   03/09/24 2030 03/12/24 0238  Weight: 65 kg 63.2 kg    Examination: General: acute on chronic older female, lying in ICU bed s/p crani, no distress  HENT: Normocephalic, moist mucous membrane.  Staples from craniotomy on right. Lungs: Clear to auscultation bilaterally. Cardiovascular: Heart rate regular in rate and rhythm.  No murmurs appreciated. Abdomen: bs, soft  Neuro: Following commands.  Alert  and oriented x 3.  Moving all 4 extremities with strength 5/5.  Resolved problem list   Assessment and Plan  Right Craniotomy for SDH Evacuation - 8/31  Hx of Lethargy/Catatonia  Delirium 8/29 CT of maxillofacial, head, and C-spine- acute subdural hemorrhage along the right cerebral convexity, measuring up to 8 mm in thickness with mass effect and with 8 mm leftward midline shift, acute subdural hemorrhage also present along the right aspect of the falx, measuring up to 5 mm in  thickness, and a Epidural hematoma at the craniocervical junction and within the cervical spinal canal, 4mm. MRI showing small dorsal epidural hematoma located between CI vertebrae and occiput. Repeat CT head 9/2 with slight worsening of SDH from 4 mm to 7 mm.  Repeat CT head 9/3 stable SDH.  Both JP drain and subdural drain have been removed. - SBP goal < 140  - On Cleviprex .  Increasing oral meds.  Now on higher dose Coreg , ARB and added hydralazine .  Will revisit blood pressure and if needed increase blood pressure medications further. - Holding anticoag per neurosurg recs. DVT prophylaxis can be resumed on 9/6. - Continue Keppra  BID  - Delirium on 9/2.  Resume home risperidone   Hyponatremia  Serum osmolarity, urine sodium, urine urea  and urine creatinine. Monitor.  HTN  P:  SBP goal < 140 as above Start Cleviprex  gtt  See above.  GERD P: P.o. Protonix .  Anxiety  Depression  P: Supportive care  Continue Effexor  and Lamictal   Resume home risperidone .  Having bowel movements.  On bowel regimen.  Awaiting coming off Cleviprex .  Will transfer to rehab per neurosurgery after off Cleviprex .    Labs   CBC: Recent Labs  Lab 03/09/24 1717 03/11/24 2338 03/12/24 0114 03/12/24 0419 03/13/24 0448 03/14/24 0856 03/15/24 0533  WBC 6.8  --  7.1 8.3 9.0 9.1 5.9  NEUTROABS 5.2  --   --  7.3  --   --   --   HGB 11.6*   < > 9.7* 10.0* 8.8* 9.8* 9.3*  HCT 32.2*   < > 27.4* 28.0* 25.5* 27.9* 27.4*  MCV 91.5  --  93.2 93.3 94.8 93.6 97.2  PLT 204  --  176 178 185 244 200   < > = values in this interval not displayed.    Basic Metabolic Panel: Recent Labs  Lab 03/12/24 0419 03/13/24 0448 03/14/24 0811 03/14/24 0913 03/15/24 0533  NA 128* 129* SPECIMEN CONTAMINATED, UNABLE TO PERFORM TEST(S). 128* 135  K 3.4* 3.3* SPECIMEN CONTAMINATED, UNABLE TO PERFORM TEST(S). 3.7 3.5  CL 99 101 SPECIMEN CONTAMINATED, UNABLE TO PERFORM TEST(S). 99 103  CO2 21* 19* SPECIMEN  CONTAMINATED, UNABLE TO PERFORM TEST(S). 19* 20*  GLUCOSE 137* 133* SPECIMEN CONTAMINATED, UNABLE TO PERFORM TEST(S). 114* 98  BUN 9 10 SPECIMEN CONTAMINATED, UNABLE TO PERFORM TEST(S). 9 6*  CREATININE 0.65 0.65 SPECIMEN CONTAMINATED, UNABLE TO PERFORM TEST(S). 0.64 0.68  CALCIUM  8.0* 7.8* SPECIMEN CONTAMINATED, UNABLE TO PERFORM TEST(S). 7.8* 8.0*   GFR: Estimated Creatinine Clearance: 54.9 mL/min (by C-G formula based on SCr of 0.68 mg/dL). Recent Labs  Lab 03/12/24 0419 03/13/24 0448 03/14/24 0856 03/15/24 0533  WBC 8.3 9.0 9.1 5.9    Liver Function Tests: No results for input(s): AST, ALT, ALKPHOS, BILITOT, PROT, ALBUMIN in the last 168 hours. No results for input(s): LIPASE, AMYLASE in the last 168 hours. No results for input(s): AMMONIA in the last 168 hours.  ABG    Component Value Date/Time   PHART  7.333 (L) 03/11/2024 2338   PCO2ART 36.2 03/11/2024 2338   PO2ART 220 (H) 03/11/2024 2338   HCO3 19.6 (L) 03/11/2024 2338   TCO2 21 (L) 03/11/2024 2338   ACIDBASEDEF 6.0 (H) 03/11/2024 2338   O2SAT 100 03/11/2024 2338     Coagulation Profile: No results for input(s): INR, PROTIME in the last 168 hours.  Cardiac Enzymes: No results for input(s): CKTOTAL, CKMB, CKMBINDEX, TROPONINI in the last 168 hours.  HbA1C: Hgb A1c MFr Bld  Date/Time Value Ref Range Status  10/17/2021 06:21 AM 5.1 4.8 - 5.6 % Final    Comment:    (NOTE) Pre diabetes:          5.7%-6.4%  Diabetes:              >6.4%  Glycemic control for   <7.0% adults with diabetes   09/09/2021 04:44 PM 4.8 4.8 - 5.6 % Final    Comment:    (NOTE) Pre diabetes:          5.7%-6.4%  Diabetes:              >6.4%  Glycemic control for   <7.0% adults with diabetes     CBG: Recent Labs  Lab 03/13/24 0741 03/13/24 1127 03/13/24 1519 03/13/24 2325 03/14/24 0307  GLUCAP 132* 140* 148* 118* 106*    Review of Systems:   See HPI   Past Medical History:  She,  has a  past medical history of Allergy, Anxiety, Arrhythmia, Asthma, Bronchitis, Depression, Family history of anesthesia complication, GERD (gastroesophageal reflux disease), Goiter (03/17/2012), Heartburn, Herpes, Hypertension, Seizures (HCC), and Urinary tract infection.   Surgical History:   Past Surgical History:  Procedure Laterality Date   CESAREAN SECTION     x 2   COLONOSCOPY     CRANIOTOMY Right 03/11/2024   Procedure: CRANIOTOMY HEMATOMA EVACUATION SUBDURAL;  Surgeon: Darnella Dorn SAUNDERS, MD;  Location: [facility] OR;  Service: Neurosurgery;  Laterality: Right;   ELBOW SURGERY     right   KNEE ARTHROSCOPY     right   PILONIDAL CYST EXCISION     spinal cystectomy     where pilonidal cyst was   THYROIDECTOMY  04/07/2012   Procedure: THYROIDECTOMY;  Surgeon: Sherlean JINNY Laughter, MD;  Location: [facility] OR;  Service: General;  Laterality: N/A;        Social History:   reports that she quit smoking about 24 years ago. Her smoking use included cigarettes. She has never used smokeless tobacco. She reports that she does not drink alcohol and does not use drugs.   Family History:  Her family history includes Atrial fibrillation in her sister; Cervical cancer in her mother; Cirrhosis in her brother; Fibromyalgia in her sister; Heart disease in her father; Heart failure in her father; Hypertension in her brother and father; Irritable bowel syndrome in her brother; Prostate cancer in her brother; Seizures in her brother and father. There is no history of Breast cancer.   Allergies Allergies  Allergen Reactions   Amlodipine  Other (See Comments)    Flushing, tingling in face, sore throat  (Norvasc )   Cyclobenzaprine Hcl Other (See Comments)    Seizures    Aripiprazole Other (See Comments)   Cymbalta  [Duloxetine  Hcl] Other (See Comments)    Made the depression worse   Tape Rash    Blisters     Home Medications  Prior to Admission medications   Medication Sig Start Date End Date Taking?  Authorizing Provider  acyclovir  (ZOVIRAX ) 400  MG tablet Take 400 mg by mouth 3 (three) times daily as needed (Flare ups). 01/09/24  Yes [provider]  busPIRone  (BUSPAR ) 7.5 MG tablet Take 7.5 mg by mouth 2 (two) times daily.   Yes [provider]  lamoTRIgine  (LAMICTAL ) 150 MG tablet Take 150 mg by mouth daily.   Yes [provider]  levothyroxine  (SYNTHROID ) 100 MCG tablet Take 1 tablet (100 mcg total) by mouth daily. 10/10/21  Yes Von Bellis, MD  metoprolol  succinate (TOPROL -XL) 25 MG 24 hr tablet Take 25 mg by mouth daily.   Yes [provider]  olmesartan (BENICAR) 20 MG tablet Take 20 mg by mouth daily. 01/07/24  Yes [provider]  risperiDONE  (RISPERDAL ) 3 MG tablet Take 3 mg by mouth at bedtime. 03/06/24  Yes [provider]  traZODone  (DESYREL ) 50 MG tablet Take 50 mg by mouth at bedtime. 03/06/24  Yes [provider]  venlafaxine  XR (EFFEXOR -XR) 150 MG 24 hr capsule Take 300 mg by mouth daily.   Yes [provider]     CRITICAL CARE Performed by: Sammi JONETTA Fredericks.     Total critical care time: 31 minutes   Critical care time was exclusive of separately billable procedures and treating other patients.   Critical care was necessary to treat or prevent imminent or life-threatening deterioration.   Critical care was time spent personally by me on the following activities: development of treatment plan with patient and/or surrogate as well as nursing, discussions with consultants, evaluation of patient's response to treatment, examination of patient, obtaining history from patient or surrogate, ordering and performing treatments and interventions, ordering and review of laboratory studies, ordering and review of radiographic studies, pulse oximetry, re-evaluation of patient's condition and participation in multidisciplinary rounds.  Sammi JONETTA Fredericks, MD Pulmonary, Critical Care and Sleep Attending.  Pager:  [phone]  03/15/2024, 10:58 AM

## 2024-03-15 NOTE — TOC Initial Note (Signed)
 Transition of Care [facility]) - Initial/Assessment Note    Patient Details  Name: Colleen [name] MRN: [id number] Date of Birth: 1950/11/01  Transition of [facility]) CM/SW Contact:    Inocente GORMAN Kindle, LCSW Phone Number: 03/15/2024, 8:23 AM  Clinical Narrative:                 Patient admitted from home s/p Right Craniotomy. CSW following for CIR determination of candidacy.   Expected Discharge Plan: IP Rehab Facility Barriers to Discharge: Continued Medical Work up   Patient Goals and CMS Choice            Expected Discharge Plan and Services In-house Referral: Clinical Social Work Discharge Planning Services: CM Consult                                          Prior Living Arrangements/Services     Patient language and need for interpreter reviewed:: Yes        Need for Family Participation in Patient Care: Yes (Comment) Care giver support system in place?: Yes (comment)   Criminal Activity/Legal Involvement Pertinent to Current Situation/Hospitalization: No - Comment as needed  Activities of Daily Living   ADL Screening (condition at time of admission) Independently performs ADLs?: Yes (appropriate for developmental age) Is the patient deaf or have difficulty hearing?: No Does the patient have difficulty seeing, even when wearing glasses/contacts?: No Does the patient have difficulty concentrating, remembering, or making decisions?: No  Permission Sought/Granted                  Emotional Assessment Appearance:: Appears stated age     Orientation: : Oriented to Self, Oriented to Place, Oriented to  Time, Oriented to Situation Alcohol / Substance Use: Not Applicable Psych Involvement: No (comment)  Admission diagnosis:  Subdural hematoma (HCC) [S06.5XAA] SDH (subdural hematoma) (HCC) [S06.5XAA] Epidural hematoma (HCC) [S06.4XAA] Injury by nail, initial encounter [T54.0XXA] Skin tear of right forearm without complication, initial encounter  [S51.811A] Patient Active Problem List   Diagnosis Date Noted   Subdural hematoma (HCC) 03/09/2024   Hyponatremia 10/24/2021   Seizure (HCC) 10/10/2021   Major depressive disorder, recurrent severe without psychotic features (HCC) 10/10/2021   MDD (major depressive disorder), recurrent severe, without psychosis (HCC) 10/09/2021   Suicidal ideation 10/07/2021   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 [place] [address] Phone: [phone] Fax: [phone]  [facility] #[id number] [place], [place] - [address] AT [facility] OF [address] & [address] [place] [address] Phone: [phone] Fax: [phone]  [facility] [address] [place] [address] Phone: [phone] Fax: [phone]     Social Drivers of Health (SDOH) Social History: SDOH Screenings   Food Insecurity: No Food Insecurity (03/09/2024)  Housing: Low Risk  (03/09/2024)  Transportation Needs: No Transportation Needs (03/09/2024)  Utilities: Not At Risk (03/09/2024)  Alcohol Screen: Low Risk  (10/10/2021)  Depression (PHQ2-9):  High Risk (10/09/2021)  Social Connections: Patient Declined (03/09/2024)  Tobacco Use: Medium Risk (03/09/2024)   SDOH Interventions:     Readmission Risk Interventions     No data to display

## 2024-03-15 NOTE — Progress Notes (Addendum)
 eLink Physician-Brief Progress Note Patient Name: Kimbley Sprague DOB: 1950-10-28 MRN: [id number]   Date of Service  03/15/2024  HPI/Events of Note  Patient initially presented with subdural evacuation on 8/31 that unfortunately persisted with the development of cerebral edema and increased ICH requiring EVD placement tonight.  Patient has been having hyponatremia throughout her stay, NSG requesting assistance to increase sodiums to 145-155  Of note, the patient is on an ARB which may exacerbate hyponatremia   eICU Interventions  Patient had improvement to 135 this morning.  Will attempt to increase by 8 to 10 mEq by a.m. 9/5  Overall Na goal 145-155  Initiate hypertonic saline protocol  May need to consider discontinuation of ARB   2201 -given encephalopathy, transition to n.p.o. status, establish enteral access, adjusted medications to be per tube or IV equivalents  2315 -initiated events, the patient is now able to tolerate oral intake.  Passed bedside swallow and is able to coordinate.  Holding NG tube placement for now, but suspect it will be necessary at some point down the road if her mental status continues to wax and wane  Intervention Category Intermediate Interventions: Electrolyte abnormality - evaluation and management  Herb Beltre 03/15/2024, 8:21 PM

## 2024-03-15 NOTE — Progress Notes (Signed)
 Pharmacy Electrolyte Replacement  Recent Labs:  Recent Labs    03/15/24 0533  K 3.5  CREATININE 0.68    Low Critical Values (K </= 2.5, Phos </= 1, Mg </= 1) Present: None  Plan: Give KCL 40meq PO once. Recheck levels with AM labs.   Damarie Schoolfield, PharmD

## 2024-03-15 NOTE — Procedures (Signed)
 PREOP DX: cerebral edema  POSTOP DX: Same  PROCEDURE: left frontal ventriculostomy   SURGEON: Dr. Dorn Glade, MD  ANESTHESIA: IV Sedation (versed  and fentanyl ) with Local  EBL: Minimal  SPECIMENS: None  COMPLICATIONS: None  CONDITION: Hemodynamically stable  INDICATIONS: Colleen [name] is a 73 y.o. female who is 5 days s/p R craniotomy for SDH evacuation. This evening suffered a neurologic decline. CT head showing increased cerebral edema compared to day previous  PROCEDURE IN DETAIL: After consent was obtained from the patient's family, skin of the left frontal scalp was clipped, prepped and draped in the usual sterile fashion.  A timeout was performed. Preoperative antibiotics were administered. Scalp was then infiltrated with local anesthetic with epinephrine .  Skin incision was made sharply, and twist drill burr hole was made.  The dura was then incised, and the ventricular catheter was passed in 1 attempt to a depth of 7 cm.  Good CSF flow was obtained.  The catheter was then tunneled subcutaneously and connected to a drainage system and the skin incision closed.  The drain was then secured in place.

## 2024-03-15 NOTE — Progress Notes (Signed)
 EVD placed  Plan: EVD clamped with ICP transducing. If ICP rises above 20, will plan to open at 15 cmH2O Recheck Na. Goal Na 145-155 CT AM SBP<140

## 2024-03-15 NOTE — Progress Notes (Signed)
 Patient lethargic, difficult to arouse, requiring sternal rub to awaken, and then patient quickly falls back asleep. LKW 1730 during dinner with this RN helping to feed. Patient at that time was A&OX4 and at baseline. Vitals stable.  Dr. Darnella on unit and asked to assess. STAT CTH ordered and completed. DC transfer orders.

## 2024-03-16 ENCOUNTER — Other Ambulatory Visit: Payer: Self-pay

## 2024-03-16 ENCOUNTER — Inpatient Hospital Stay (HOSPITAL_COMMUNITY)

## 2024-03-16 LAB — BASIC METABOLIC PANEL WITH GFR
Anion gap: 14 (ref 5–15)
Anion gap: 12 (ref 5–15)
BUN: 9 mg/dL (ref 8–23)
BUN: 11 mg/dL (ref 8–23)
CO2: 18 mmol/L — ABNORMAL LOW (ref 22–32)
CO2: 18 mmol/L — ABNORMAL LOW (ref 22–32)
Calcium: 8 mg/dL — ABNORMAL LOW (ref 8.9–10.3)
Calcium: 8 mg/dL — ABNORMAL LOW (ref 8.9–10.3)
Chloride: 108 mmol/L (ref 98–111)
Chloride: 108 mmol/L (ref 98–111)
Creatinine, Ser: 0.7 mg/dL (ref 0.44–1.00)
Creatinine, Ser: 0.74 mg/dL (ref 0.44–1.00)
GFR, Estimated: >60 mL/min (ref >60)
GFR, Estimated: >60 mL/min (ref >60)
Glucose, Bld: 94 mg/dL (ref 70–99)
Glucose, Bld: 93 mg/dL (ref 70–99)
Potassium: 3.7 mmol/L (ref 3.5–5.1)
Potassium: 3.8 mmol/L (ref 3.5–5.1)
Sodium: 140 mmol/L (ref 135–145)
Sodium: 138 mmol/L (ref 135–145)

## 2024-03-16 LAB — CBC
HCT: 28.6 % — ABNORMAL LOW (ref 36.0–46.0)
Hemoglobin: 9.5 g/dL — ABNORMAL LOW (ref 12.0–15.0)
MCH: 32.2 pg (ref 26.0–34.0)
MCHC: 33.2 g/dL (ref 30.0–36.0)
MCV: 96.9 fL (ref 80.0–100.0)
Platelets: 236 K/uL (ref 150–400)
RBC: 2.95 MIL/uL — ABNORMAL LOW (ref 3.87–5.11)
RDW: 12.9 % (ref 11.5–15.5)
WBC: 5.5 K/uL (ref 4.0–10.5)
nRBC: 0 % (ref 0.0–0.2)

## 2024-03-16 LAB — SODIUM
Sodium: 139 mmol/L (ref 135–145)
Sodium: 139 mmol/L (ref 135–145)
Sodium: 145 mmol/L (ref 135–145)

## 2024-03-16 LAB — GLUCOSE, CAPILLARY
Glucose-Capillary: 112 mg/dL — ABNORMAL HIGH (ref 70–99)
Glucose-Capillary: 94 mg/dL (ref 70–99)

## 2024-03-16 LAB — PHOSPHORUS: Phosphorus: 3.9 mg/dL (ref 2.5–4.6)

## 2024-03-16 LAB — MAGNESIUM: Magnesium: 1.9 mg/dL (ref 1.7–2.4)

## 2024-03-16 MED ORDER — VENLAFAXINE HCL 75 MG PO TABS
150.0000 mg | ORAL_TABLET | Freq: Two times a day (BID) | ORAL | Status: DC
  Administered 2024-03-16 – 2024-03-21 (×11): 150 mg
  Filled 2024-03-16 (×11): qty 2

## 2024-03-16 MED ORDER — ACETAMINOPHEN 650 MG RE SUPP: 650.0000 mg | RECTAL | Status: DC | PRN

## 2024-03-16 MED ORDER — LEVOTHYROXINE SODIUM 100 MCG PO TABS
100.0000 ug | ORAL_TABLET | Freq: Every day | ORAL | Status: DC
  Administered 2024-03-17 – 2024-03-21 (×5): 100 ug
  Filled 2024-03-16 (×6): qty 1

## 2024-03-16 MED ORDER — HYDRALAZINE HCL 50 MG PO TABS
100.0000 mg | ORAL_TABLET | Freq: Three times a day (TID) | ORAL | Status: DC
  Administered 2024-03-16 – 2024-03-21 (×15): 100 mg
  Filled 2024-03-16 (×15): qty 2

## 2024-03-16 MED ORDER — ONDANSETRON HCL 4 MG PO TABS: 4.0000 mg | ORAL_TABLET | ORAL | Status: DC | PRN

## 2024-03-16 MED ORDER — LEVETIRACETAM 500 MG PO TABS
500.0000 mg | ORAL_TABLET | Freq: Two times a day (BID) | ORAL | Status: AC
  Administered 2024-03-16 – 2024-03-18 (×5): 500 mg
  Filled 2024-03-16 (×5): qty 1

## 2024-03-16 MED ORDER — PROSOURCE TF20 ENFIT COMPATIBL EN LIQD
60.0000 mL | Freq: Two times a day (BID) | ENTERAL | Status: DC
  Administered 2024-03-16 – 2024-03-23 (×15): 60 mL
  Filled 2024-03-16 (×15): qty 60

## 2024-03-16 MED ORDER — SODIUM CHLORIDE 0.9% FLUSH
10.0000 mL | Freq: Two times a day (BID) | INTRAVENOUS | Status: DC
  Administered 2024-03-16 – 2024-03-19 (×6): 10 mL
  Administered 2024-03-19: 40 mL
  Administered 2024-03-20 – 2024-03-30 (×18): 10 mL

## 2024-03-16 MED ORDER — CARVEDILOL 12.5 MG PO TABS
12.5000 mg | ORAL_TABLET | Freq: Two times a day (BID) | ORAL | Status: DC
  Administered 2024-03-16 – 2024-03-21 (×11): 12.5 mg
  Filled 2024-03-16 (×12): qty 1

## 2024-03-16 MED ORDER — TRAZODONE HCL 50 MG PO TABS
50.0000 mg | ORAL_TABLET | Freq: Every day | ORAL | Status: DC
  Administered 2024-03-16 – 2024-03-20 (×5): 50 mg
  Filled 2024-03-16 (×5): qty 1

## 2024-03-16 MED ORDER — ACETAMINOPHEN 325 MG PO TABS
650.0000 mg | ORAL_TABLET | ORAL | Status: DC | PRN
  Administered 2024-03-17 – 2024-03-18 (×3): 650 mg
  Filled 2024-03-16 (×3): qty 2

## 2024-03-16 MED ORDER — BUSPIRONE HCL 15 MG PO TABS
7.5000 mg | ORAL_TABLET | Freq: Two times a day (BID) | ORAL | Status: DC
  Administered 2024-03-16 – 2024-03-20 (×10): 7.5 mg
  Filled 2024-03-16 (×10): qty 1

## 2024-03-16 MED ORDER — SODIUM CHLORIDE 0.9% FLUSH
10.0000 mL | INTRAVENOUS | Status: DC | PRN
  Administered 2024-03-17: 10 mL

## 2024-03-16 MED ORDER — POTASSIUM CHLORIDE 20 MEQ PO PACK
40.0000 meq | PACK | Freq: Once | ORAL | Status: AC
  Administered 2024-03-16: 40 meq
  Filled 2024-03-16: qty 2

## 2024-03-16 MED ORDER — LAMOTRIGINE 25 MG PO TABS
150.0000 mg | ORAL_TABLET | Freq: Every day | ORAL | Status: DC
  Administered 2024-03-16 – 2024-03-20 (×5): 150 mg
  Filled 2024-03-16 (×5): qty 2

## 2024-03-16 MED ORDER — RISPERIDONE 3 MG PO TABS
3.0000 mg | ORAL_TABLET | Freq: Every day | ORAL | Status: DC
  Administered 2024-03-16 – 2024-03-20 (×5): 3 mg
  Filled 2024-03-16 (×5): qty 1

## 2024-03-16 MED ORDER — VITAL HP 1.0 CAL PO LIQD: 1000.0000 mL | ORAL | Status: DC

## 2024-03-16 MED ORDER — ONDANSETRON HCL 4 MG/2ML IJ SOLN
4.0000 mg | INTRAMUSCULAR | Status: DC | PRN
  Administered 2024-03-20: 4 mg via INTRAVENOUS
  Filled 2024-03-16 (×2): qty 2

## 2024-03-16 MED ORDER — OSMOLITE 1.5 CAL PO LIQD
1000.0000 mL | ORAL | Status: DC
  Administered 2024-03-16 – 2024-03-21 (×6): 1000 mL

## 2024-03-16 MED ORDER — IRBESARTAN 150 MG PO TABS
300.0000 mg | ORAL_TABLET | Freq: Every day | ORAL | Status: DC
  Administered 2024-03-16 – 2024-03-20 (×5): 300 mg
  Filled 2024-03-16 (×5): qty 2

## 2024-03-16 NOTE — Progress Notes (Signed)
 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 Stable on repeat imaging yet patient was exhibiting lethargy, so the pt underwent R craniotomy for SDH evacuation on 8/31. Was doing well postop, but on POD4 had another neurologic decline with slight increase in global cerebral edema. EVD placed for ICP monitoring, and patient improved neurologically despite no drainage  LOS: 4 days    Plan: Complex scenario with brain pathologies and catatonia episodes. Will plan to treat for cerebral edema over the weekend with ICP monitoring and Na goal of 145-155. Appreciate CCS assist with hypertonic EVD clamped and transducing ICP  SBP<140 AAT DAT No collar or intervention for CCJ findings Ok for DVT chemoppx on 9/7 Rehab consult placed  Subjective: Yesterday during the day the patient was awake and interactive. In the early evening, she had a neurologic decline, lethargic, unresponsive to sternal rub. CT showed increased cerebral edema compared to the previous CT. Decision made to place an EVD for ICP monitoring. ICP's have been low since placement and without any drainage. After the procedure she spontaneously improved to following commands again and providing one word answers to questions. I had a long discussion with her daughter [name] both before and after regarding the complexity in decision making. It is very difficult to distinguish catatonic state vs true neurologic decline. Given the potential consequences of neurologic decline, we have had to act aggressively. We will plan to treat her for cerebral edema with hypertonic saline and with ICP monitoring over the weekend  Objective: Vital signs in last 24 hours: Temp:  [97.6 F (36.4 C)-98.9 F (37.2 C)] 98.5 F (36.9 C) (09/05 0800) Pulse Rate:  [63-90] 67 (09/05 0712) Resp:  [10-25] 16 (09/05 0712) BP: (117-164)/(58-81) 142/69 (09/05  0712) SpO2:  [94 %-100 %] 100 % (09/05 0712) Arterial Line BP: (100-138)/(57-74) 100/69 (09/04 1210)  Intake/Output from previous day: 09/04 0701 - 09/05 0700 In: 722.9 [P.O.:90; I.V.:582.9; IV Piggyback:50] Out: 150 [Urine:150] Intake/Output this shift: No intake/output data recorded.  Exam: GCS 3E 4V 33M Oriented to name and place. Slight confusion with conversation Conjugate gaze, PERRL Grimace symmetric Commands BUE and BLE full strength Cranial incision c/d/I, staples EVD incision c/d/I, sutures. ICP<10  Lab Results: Recent Labs    03/15/24 0533 03/16/24 0534  WBC 5.9 5.5  HGB 9.3* 9.5*  HCT 27.4* 28.6*  PLT 200 236   BMET Recent Labs    03/15/24 2214 03/16/24 0534  NA 135  133* 140  K 3.8 3.7  CL 102 108  CO2 22 18*  GLUCOSE 106* 94  BUN 9 9  CREATININE 0.78 0.70  CALCIUM  8.0* 8.0*       Dorn JONELLE Glade 03/16/2024, 8:52 AM

## 2024-03-16 NOTE — Consult Note (Signed)
 NAME:  Colleen [name], MRN:  [id number], DOB:  03-Jul-1951, LOS: 4 ADMISSION DATE:  03/09/2024, CONSULTATION DATE:  03/12/24  REFERRING MD:  MD Malcolm CHIEF COMPLAINT:  Subdural Hematoma   History of Present Illness:  Pt is a 73 yr old female with significant pmhx of HTN, Asthma, GERD, lethargy/catanoia anxiety, and depression who presented to [facility] post fall with complaints of headache, neck pain, and nausea on 03/09/24. CT scans of maxillofacial, head, C-Spine were obtained revealed, right scalp/periorbital hematoma, acute subdural hemorrhage along the right cerebral convexity, measuring up to 8 mm in thickness with mass effect and with 8 mm leftward midline shift, acute subdural hemorrhage also present along the right aspect of the falx, measuring up to 5 mm in thickness, and a Epidural hematoma at the craniocervical junction and within the cervical spinal canal, 4mm. Patient transferred to [facility] for further neuro evaluation and MRI of cervical spine. MRI showing small dorsal epidural hematoma located between CI vertebrae and occiput.  On 8/31, patient having worsening neurostatus becoming more lethargic. Patient was given 6mg  of ativan  for history catatonia/lethargic with no improvement subsequently patient taken for a craniotomy for subdural hematoma evacuation.   Pertinent  Medical History   Past Medical History:  Diagnosis Date   Allergy    Anxiety    Arrhythmia    does not see cardiologist, sees pcp, Dr. 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  CT head showed worsening SDH.  Repeat CT head with stable SDH.  Discussed with neurosurgery, expected postop changes.  No surgical interventions.  EEG negative. 9/3: all drains are out.  9/4: Worsening mentation.  Stat CT head showed slight worsening SDH [9 mm] with persistent midline shift to left, increased cerebral edema with crowding of basilar cisterns increased effacement of fourth ventricle and partial effacement of prepontine cistern and inferior descent of cerebral tonsils.  Status post EVD placement.  Only transdusing.  Interim History / Subjective:  Overnight patient become obtunded.  CT head was done.  EVD was placed.  See above. Currently EVD transducing.  Pressures of 2. Currently alert and oriented x 3.  Strength is 5 out of 5.  Objective    Blood pressure (!) 145/75, pulse 71, temperature 98.5 F (36.9 C), temperature source Axillary, resp. rate 18, height 5' 4.02 (1.626 m), weight 63.2 kg, SpO2 100%.        Intake/Output Summary (Last 24 hours) at 03/16/2024 1024 Last data filed at 03/16/2024 0900 Gross per 24 hour  Intake 752.92 ml  Output 150 ml  Net 602.92 ml  Filed Weights   03/09/24 2030 03/12/24 0238  Weight: 65 kg 63.2 kg    Examination: General: acute on chronic older female, lying in ICU bed s/p crani, no distress  HENT: Normocephalic, moist mucous membrane.  Staples from craniotomy on right.  EVD in place Lungs: Clear to auscultation bilaterally. Cardiovascular: Heart rate regular in rate and rhythm.  No murmurs appreciated. Abdomen: bs, soft  Neuro: Following commands.  Alert and oriented x 3.  Moving all  4 extremities with strength 5/5.  Resolved problem list   Assessment and Plan  Right Craniotomy for SDH Evacuation - 8/31  Hx of Lethargy/Catatonia , diagnosed as functional catatonia Multiple events in the hospital suggestive of delirium versus functional catatonia 8/29 CT of maxillofacial, head, and C-spine- acute subdural hemorrhage along the right cerebral convexity, measuring up to 8 mm in thickness with mass effect and with 8 mm leftward midline shift, acute subdural hemorrhage also present along the right aspect of the falx, measuring up to 5 mm in thickness, and a Epidural hematoma at the craniocervical junction and within the cervical spinal canal, 4mm. MRI showing small dorsal epidural hematoma located between CI vertebrae and occiput. Repeat CT head 9/2 with slight worsening of SDH from 4 mm to 7 mm.  Repeat CT head 9/3 stable SDH. CT head 9/4: SDH of 9 mm slight worsening from 8 mm.  Cerebral edema. Status post EVD on 9/4.  Both JP drain and subdural drain have been removed. - SBP goal < 140  - Sodium goal 145-155 per neurosurgery.  Started on 3% normal saline.  Check BMP Q6.  Will place PICC line. -  Now on higher dose Coreg , ARB and added hydralazine .   - Holding anticoag per neurosurg recs. DVT prophylaxis can be resumed on 9/6. - Continue Keppra  BID  -Psych meds as below for catatonia.  Hyponatremia  Low urine sodium.  Urine osmolality not available.  Likely hypovolemic related hyponatremia. Currently on 3% saline as above.  HTN  P:  SBP goal < 140 as above See above.  GERD P: P.o. Protonix .  Anxiety  Depression  P: Supportive care  Continue home Effexor  Lamictal  and risperidone .  DVT prophylaxis: SCDs. IVF 3% Will place PICC line today.  Continue ICU monitoring for EVD monitoring.    Labs   CBC: Recent Labs  Lab 03/09/24 1717 03/11/24 2338 03/12/24 0419 03/13/24 0448 03/14/24 0856 03/15/24 0533 03/16/24 0534  WBC 6.8   < > 8.3 9.0 9.1 5.9  5.5  NEUTROABS 5.2  --  7.3  --   --   --   --   HGB 11.6*   < > 10.0* 8.8* 9.8* 9.3* 9.5*  HCT 32.2*   < > 28.0* 25.5* 27.9* 27.4* 28.6*  MCV 91.5   < > 93.3 94.8 93.6 97.2 96.9  PLT 204   < > 178 185 244 200 236   < > = values in this interval not displayed.    Basic Metabolic Panel: Recent Labs  Lab 03/14/24 0811 03/14/24 0913 03/15/24 0533 03/15/24 2214 03/16/24 0534 03/16/24 0759  NA SPECIMEN CONTAMINATED, UNABLE TO PERFORM TEST(S). 128* 135 135  133* 140 139  K SPECIMEN CONTAMINATED, UNABLE TO PERFORM TEST(S). 3.7 3.5 3.8 3.7  --   CL SPECIMEN CONTAMINATED, UNABLE TO PERFORM TEST(S). 99 103 102 108  --   CO2 SPECIMEN CONTAMINATED, UNABLE TO PERFORM TEST(S). 19* 20* 22 18*  --   GLUCOSE SPECIMEN CONTAMINATED, UNABLE TO PERFORM TEST(S). 114* 98 106*  94  --   BUN SPECIMEN CONTAMINATED, UNABLE TO PERFORM TEST(S). 9 6* 9 9  --   CREATININE SPECIMEN CONTAMINATED, UNABLE TO PERFORM TEST(S). 0.64 0.68 0.78 0.70  --   CALCIUM  SPECIMEN CONTAMINATED, UNABLE TO PERFORM TEST(S). 7.8* 8.0* 8.0* 8.0*  --    GFR: Estimated Creatinine Clearance: 54.9 mL/min (by C-G formula based on SCr of 0.7 mg/dL). Recent Labs  Lab 03/13/24 0448 03/14/24 0856 03/15/24 0533 03/16/24 0534  WBC 9.0 9.1 5.9 5.5    Liver Function Tests: No results for input(s): AST, ALT, ALKPHOS, BILITOT, PROT, ALBUMIN in the last 168 hours. No results for input(s): LIPASE, AMYLASE in the last 168 hours. No results for input(s): AMMONIA in the last 168 hours.  ABG    Component Value Date/Time   PHART 7.333 (L) 03/11/2024 2338   PCO2ART 36.2 03/11/2024 2338   PO2ART 220 (H) 03/11/2024 2338   HCO3 19.6 (L) 03/11/2024 2338   TCO2 21 (L) 03/11/2024 2338   ACIDBASEDEF 6.0 (H) 03/11/2024 2338   O2SAT 100 03/11/2024 2338     Coagulation Profile: No results for input(s): INR, PROTIME in the last 168 hours.  Cardiac Enzymes: No results for input(s): CKTOTAL, CKMB, CKMBINDEX,  TROPONINI in the last 168 hours.  HbA1C: Hgb A1c MFr Bld  Date/Time Value Ref Range Status  10/17/2021 06:21 AM 5.1 4.8 - 5.6 % Final    Comment:    (NOTE) Pre diabetes:          5.7%-6.4%  Diabetes:              >6.4%  Glycemic control for   <7.0% adults with diabetes   09/09/2021 04:44 PM 4.8 4.8 - 5.6 % Final    Comment:    (NOTE) Pre diabetes:          5.7%-6.4%  Diabetes:              >6.4%  Glycemic control for   <7.0% adults with diabetes     CBG: Recent Labs  Lab 03/13/24 0741 03/13/24 1127 03/13/24 1519 03/13/24 2325 03/14/24 0307  GLUCAP 132* 140* 148* 118* 106*    Review of Systems:   See HPI   Past Medical History:  She,  has a past medical history of Allergy, Anxiety, Arrhythmia, Asthma, Bronchitis, Depression, Family history of anesthesia complication, GERD (gastroesophageal reflux disease), Goiter (03/17/2012), Heartburn, Herpes, Hypertension, Seizures (HCC), and Urinary tract infection.   Surgical History:   Past Surgical History:  Procedure Laterality Date   CESAREAN SECTION     x 2   COLONOSCOPY     CRANIOTOMY Right 03/11/2024   Procedure: CRANIOTOMY HEMATOMA EVACUATION SUBDURAL;  Surgeon: Darnella Dorn SAUNDERS, MD;  Location: [facility] OR;  Service: Neurosurgery;  Laterality: Right;   ELBOW SURGERY     right   KNEE ARTHROSCOPY     right   PILONIDAL CYST EXCISION     spinal cystectomy     where pilonidal cyst was   THYROIDECTOMY  04/07/2012   Procedure: THYROIDECTOMY;  Surgeon: Sherlean JINNY Laughter, MD;  Location: [facility] OR;  Service: General;  Laterality: N/A;        Social History:   reports that she quit smoking about 24 years ago. Her smoking use included cigarettes. She has never used smokeless tobacco. She reports that she does not drink alcohol and does not use drugs.   Family History:  Her family history includes Atrial fibrillation in her sister; Cervical cancer in her mother; Cirrhosis in her  brother; Fibromyalgia in her sister; Heart  disease in her father; Heart failure in her father; Hypertension in her brother and father; Irritable bowel syndrome in her brother; Prostate cancer in her brother; Seizures in her brother and father. There is no history of Breast cancer.   Allergies Allergies  Allergen Reactions   Amlodipine  Other (See Comments)    Flushing, tingling in face, sore throat  (Norvasc )   Cyclobenzaprine Hcl Other (See Comments)    Seizures    Aripiprazole Other (See Comments)   Cymbalta  [Duloxetine  Hcl] Other (See Comments)    Made the depression worse   Tape Rash    Blisters     Home Medications  Prior to Admission medications   Medication Sig Start Date End Date Taking? Authorizing Provider  acyclovir  (ZOVIRAX ) 400 MG tablet Take 400 mg by mouth 3 (three) times daily as needed (Flare ups). 01/09/24  Yes [provider]  busPIRone  (BUSPAR ) 7.5 MG tablet Take 7.5 mg by mouth 2 (two) times daily.   Yes [provider]  lamoTRIgine  (LAMICTAL ) 150 MG tablet Take 150 mg by mouth daily.   Yes [provider]  levothyroxine  (SYNTHROID ) 100 MCG tablet Take 1 tablet (100 mcg total) by mouth daily. 10/10/21  Yes Von Bellis, MD  metoprolol  succinate (TOPROL -XL) 25 MG 24 hr tablet Take 25 mg by mouth daily.   Yes [provider]  olmesartan (BENICAR) 20 MG tablet Take 20 mg by mouth daily. 01/07/24  Yes [provider]  risperiDONE  (RISPERDAL ) 3 MG tablet Take 3 mg by mouth at bedtime. 03/06/24  Yes [provider]  traZODone  (DESYREL ) 50 MG tablet Take 50 mg by mouth at bedtime. 03/06/24  Yes [provider]  venlafaxine  XR (EFFEXOR -XR) 150 MG 24 hr capsule Take 300 mg by mouth daily.   Yes [provider]     CRITICAL CARE Performed by: Sammi JONETTA Fredericks.     Total critical care time: 40 minutes   Critical care time was exclusive of separately billable procedures and treating other patients.   Critical care was necessary to treat or  prevent imminent or life-threatening deterioration.   Critical care was time spent personally by me on the following activities: development of treatment plan with patient and/or surrogate as well as nursing, discussions with consultants, evaluation of patient's response to treatment, examination of patient, obtaining history from patient or surrogate, ordering and performing treatments and interventions, ordering and review of laboratory studies, ordering and review of radiographic studies, pulse oximetry, re-evaluation of patient's condition and participation in multidisciplinary rounds.  Sammi JONETTA Fredericks, MD Pulmonary, Critical Care and Sleep Attending.  Pager: [phone]  03/16/2024, 10:24 AM

## 2024-03-16 NOTE — Progress Notes (Signed)
 Physical Therapy Treatment Patient Details Name: Colleen [name] MRN: [id number] DOB: [birth year] Today's Date: 03/16/2024   History of Present Illness Colleen [name] is a 73 yo female presenting to ED 8/29 after a fall with headache, neck pain, and nausea. CT shows R scalp/periorbital hematoma, acute SDH along the R cerebral convexity measuring 8 mm thickness with mass effect and 8 mm leftward midline shift as well as an acute SDH along the R aspect of the falx measuring up to 5 mm in thickness, and an epidural hematoma at the craniocervical junction. MRI shows small dorsal epidural hematoma between C1 vertebrae and occiput. Worsening neurostatus 8/31, taken for R craniotomy for subdural hematoma evacuation. s/p EVD placement 9/4. PMH includes HTN, asthma, GERD, anxiety/depression, thyroid  garter s/p total thyroidectomy 2013    PT Comments  Pt stuporous upon PT arrival, as pt does not arouse to sternal rubs, loud verbalization, or painful stimuli except in LUE. Pt with eyes closed throughout even with max stimulation, RN aware and to inform MD. Pt requiring total +2 assist for pivot back to bed, completely dependent transfer back to bed given level of arousal. PT to continue to follow.   BP 141/100, other VSS    If plan is discharge home, recommend the following: A little help with walking and/or transfers;Help with stairs or ramp for entrance;Assist for transportation;Supervision due to cognitive status;Two people to help with walking and/or transfers;Two people to help with bathing/dressing/bathroom   Can travel by private vehicle        Equipment Recommendations  Other (comment) (tbd)    Recommendations for Other Services       Precautions / Restrictions Precautions Precautions: Fall Precaution/Restrictions Comments: SBP < 140, aline, evd Restrictions Weight Bearing Restrictions Per Provider Order: No     Mobility  Bed Mobility Overal bed mobility: Needs Assistance Bed  Mobility: Sit to Supine       Sit to supine: Total assist, +2 for physical assistance   General bed mobility comments: total +2 all aspects, pt assisting 0%    Transfers Overall transfer level: Needs assistance   Transfers: Bed to chair/wheelchair/BSC            Lateral/Scoot Transfers: Total assist, +2 physical assistance General transfer comment: total +2 for all aspects, pivot towards L for drop arm side of recliner as pt not arousable to participate in transfer. Completely dependent transfer with bilat LE blocking and use of bed pad to perform    Ambulation/Gait               General Gait Details: unable   Stairs             Wheelchair Mobility     Tilt Bed    Modified Rankin (Stroke Patients Only)       Balance Overall balance assessment: Needs assistance Sitting-balance support: Feet supported Sitting balance-Leahy Scale: Fair     Standing balance support: Reliant on assistive device for balance Standing balance-Leahy Scale: Poor Standing balance comment: Posterior LOB with no external support                            Communication Communication Communication: Impaired Factors Affecting Communication: Difficulty expressing self  Cognition Arousal: Stuporous Behavior During Therapy: Flat affect   PT - Cognitive impairments: Sequencing, Awareness, Initiation                       PT -  Cognition Comments: no command following, does not arouse to sternal rubs, loud verbalization, or painful stimuli. eyes closed throughout session Following commands: Impaired Following commands impaired: Follows one step commands inconsistently (no command following)    Cueing Cueing Techniques: Verbal cues  Exercises      General Comments General comments (skin integrity, edema, etc.): BP 141/100, other VSS      Pertinent Vitals/Pain Pain Assessment Pain Assessment: No/denies pain    Home Living                           Prior Function            PT Goals (current goals can now be found in the care plan section) Acute Rehab PT Goals Patient Stated Goal: did not state PT Goal Formulation: With patient Time For Goal Achievement: 03/28/24 Potential to Achieve Goals: Good Progress towards PT goals: Progressing toward goals    Frequency    Min 3X/week      PT Plan      Co-evaluation              AM-PAC PT 6 Clicks Mobility   Outcome Measure  Help needed turning from your back to your side while in a flat bed without using bedrails?: Total Help needed moving from lying on your back to sitting on the side of a flat bed without using bedrails?: Total Help needed moving to and from a bed to a chair (including a wheelchair)?: Total Help needed standing up from a chair using your arms (e.g., wheelchair or bedside chair)?: Total Help needed to walk in hospital room?: Total Help needed climbing 3-5 steps with a railing? : Total 6 Click Score: 6    End of Session   Activity Tolerance: Patient limited by lethargy Patient left: with call bell/phone within reach;in bed;with bed alarm set;with family/visitor present;with nursing/sitter in room;with SCD's reapplied Nurse Communication: Mobility status;Other (comment) (stuporous) PT Visit Diagnosis: Unsteadiness on feet (R26.81);Difficulty in walking, not elsewhere classified (R26.2)     Time: 8658-8596 PT Time Calculation (min) (ACUTE ONLY): 22 min  Charges:    $Therapeutic Activity: 8-22 mins PT General Charges $$ ACUTE PT VISIT: 1 Visit                     Johana RAMAN, PT DPT Acute Rehabilitation Services Secure Chat Preferred  Office [phone]    Darcey Demma E Stroup 03/16/2024, 2:50 PM

## 2024-03-16 NOTE — Progress Notes (Signed)
 Occupational Therapy Treatment Patient Details Name: Colleen [name] MRN: [id number] DOB: May 19, 1951 Today's Date: 03/16/2024   History of present illness Colleen [name] is a 73 yo female presenting to ED 8/29 after a fall with headache, neck pain, and nausea. CT shows R scalp/periorbital hematoma, acute SDH along the R cerebral convexity measuring 8 mm thickness with mass effect and 8 mm leftward midline shift as well as an acute SDH along the R aspect of the falx measuring up to 5 mm in thickness, and an epidural hematoma at the craniocervical junction. MRI shows small dorsal epidural hematoma between C1 vertebrae and occiput. Worsening neurostatus 8/31, taken for R craniotomy for subdural hematoma evacuation. PMH includes HTN, asthma, GERD, anxiety/depression, thyroid  garter s/p total thyroidectomy 2013   OT comments  Patient with fair progress toward patient focused goals.  Continues to struggle with dynamic balance, level of alertness and command following.  Patient needing up to Min A and RW management for basic mobility due to posterior bias for balance. Max A for lower body ADL and hygiene in standing.  OT to continue efforts in the acute setting to address deficits and Patient will benefit from intensive inpatient follow-up therapy, >3 hours/day.      If plan is discharge home, recommend the following:  A lot of help with bathing/dressing/bathroom;A lot of help with walking and/or transfers;Two people to help with walking and/or transfers;Assistance with cooking/housework;Assist for transportation;Help with stairs or ramp for entrance;Direct supervision/assist for financial management;Direct supervision/assist for medications management   Equipment Recommendations  None recommended by OT    Recommendations for Other Services      Precautions / Restrictions Precautions Precautions: Fall Precaution/Restrictions Comments: SBP < 140, aline Restrictions Weight Bearing Restrictions Per  Provider Order: No       Mobility Bed Mobility                    Transfers Overall transfer level: Needs assistance Equipment used: Rolling walker (2 wheels) Transfers: Sit to/from Stand, Bed to chair/wheelchair/BSC Sit to Stand: Min assist     Step pivot transfers: Min assist           Balance Overall balance assessment: Needs assistance Sitting-balance support: Feet supported Sitting balance-Leahy Scale: Fair     Standing balance support: Reliant on assistive device for balance Standing balance-Leahy Scale: Poor Standing balance comment: Posterior LOB with no external support                           ADL either performed or assessed with clinical judgement   ADL                           Toilet Transfer: Minimal assistance;Rolling walker (2 wheels);BSC/3in1   Toileting- Clothing Manipulation and Hygiene: Moderate assistance;Sit to/from stand              Extremity/Trunk Assessment Upper Extremity Assessment Upper Extremity Assessment: Generalized weakness;LUE deficits/detail LUE Deficits / Details: Difficulty holding onto RW L handle, weak grip LUE Coordination: decreased fine motor;decreased gross motor   Lower Extremity Assessment Lower Extremity Assessment: Defer to PT evaluation   Cervical / Trunk Assessment Cervical / Trunk Assessment: Normal    Vision   Vision Assessment?: Vision impaired- to be further tested in functional context;Yes   Perception Perception Perception: Not tested   Praxis Praxis Praxis: Not tested   Communication Communication Communication: Impaired Factors Affecting Communication:  Difficulty expressing self   Cognition Arousal: Alert Behavior During Therapy: Flat affect Cognition: Cognition impaired         Attention impairment (select first level of impairment): Sustained attention                     Following commands: Impaired Following commands impaired: Follows  one step commands with increased time      Cueing   Cueing Techniques: Verbal cues  Exercises      Shoulder Instructions       General Comments      Pertinent Vitals/ Pain       Pain Assessment Pain Assessment: No/denies pain Pain Intervention(s): Monitored during session                                                          Frequency  Min 2X/week        Progress Toward Goals  OT Goals(current goals can now be found in the care plan section)  Progress towards OT goals: (P) Progressing toward goals  Acute Rehab OT Goals OT Goal Formulation: (P) With patient Time For Goal Achievement: (P) 03/28/24 Potential to Achieve Goals: (P) Good  Plan      Co-evaluation                 AM-PAC OT 6 Clicks Daily Activity     Outcome Measure   Help from another person eating meals?: A Little Help from another person taking care of personal grooming?: A Little Help from another person toileting, which includes using toliet, bedpan, or urinal?: A Lot Help from another person bathing (including washing, rinsing, drying)?: A Lot Help from another person to put on and taking off regular upper body clothing?: A Little Help from another person to put on and taking off regular lower body clothing?: A Lot 6 Click Score: 15    End of Session Equipment Utilized During Treatment: Rolling walker (2 wheels)  OT Visit Diagnosis: Unsteadiness on feet (R26.81);Muscle weakness (generalized) (M62.81);Other symptoms and signs involving cognitive function;Pain   Activity Tolerance Patient tolerated treatment well   Patient Left in chair;with call bell/phone within reach;with chair alarm set;with family/visitor present   Nurse Communication Mobility status        Time: 8795-8781 OT Time Calculation (min): 14 min  Charges: OT General Charges $OT Visit: 1 Visit OT Treatments $Therapeutic Activity: 8-22 mins  03/16/2024  RP, OTR/L  Acute  Rehabilitation Services  Office:  [phone]   Colleen [name] 03/16/2024, 12:32 PM

## 2024-03-16 NOTE — Progress Notes (Signed)
 Pharmacy Electrolyte Replacement  Recent Labs:  Recent Labs    03/16/24 0534  K 3.7  CREATININE 0.70    Low Critical Values (K </= 2.5, Phos </= 1, Mg </= 1) Present: None  Plan: Give KCL 40 mEq per tube once  Ramey Schiff, PharmD

## 2024-03-16 NOTE — TOC Progression Note (Signed)
 Transition of Care [facility]) - Progression Note    Patient Details  Name: Colleen [name] MRN: [id number] Date of Birth: [birth year]  Transition of Care [facility]) CM/SW Contact  Inocente GORMAN Kindle, LCSW Phone Number: 03/16/2024, 8:52 AM  Clinical Narrative:    CSW received request to assist with HCPOA and mychart. CSW requested RN consult Spiritual care to assist with HCPOA. MyChart access is typically provided on patient's AVS at discharge but that is out of CSW's scope.    Expected Discharge Plan: IP Rehab Facility Barriers to Discharge: Continued Medical Work up               Expected Discharge Plan and Services In-house Referral: Clinical Social Work Discharge Planning Services: CM Consult                                           Social Drivers of Health (SDOH) Interventions SDOH Screenings   Food Insecurity: No Food Insecurity (03/09/2024)  Housing: Low Risk  (03/09/2024)  Transportation Needs: No Transportation Needs (03/09/2024)  Utilities: Not At Risk (03/09/2024)  Alcohol Screen: Low Risk  (10/10/2021)  Depression (PHQ2-9): High Risk (10/09/2021)  Social Connections: Patient Declined (03/09/2024)  Tobacco Use: Medium Risk (03/09/2024)    Readmission Risk Interventions     No data to display

## 2024-03-16 NOTE — Progress Notes (Addendum)
 Initial Nutrition Assessment  DOCUMENTATION CODES:   Not applicable  INTERVENTION:   Spoke with MD ok to start TF.  Initiate tube feeding via Cortrak tube: Osmolite 1.5 at 40 ml/h (960 ml per day)  Prosource TF20 60 ml BID  Provides 1600 kcal, 100 gm protein, 729 ml free water daily  Monitor magnesium  and phosphorus daily x 4 occurrences, MD to replete as needed   NUTRITION DIAGNOSIS:   Inadequate oral intake related to dysphagia as evidenced by NPO status.  GOAL:   Patient will meet greater than or equal to 90% of their needs  MONITOR:   TF tolerance, Diet advancement  REASON FOR ASSESSMENT:    (cortrak)    ASSESSMENT:   Pt with PMH of HTN, asthma, GERD, anxiety, and depression admitted post fall with acute SDH.   Pt discussed during ICU rounds and with RN and MD.   8/29 - admitted for SDH and cervial spine hematoma 8/31 - s/p R crani for SDH evacuation  9/2 - confused with worsening SDH 9/5 - s/p cortrak placement; tip gastric    Spoke with pt and sister. Pt able to provide most of her hx. She lives alone and has had no recent changes in her appetite or weight. Pt drives. Sister lives in [place] .   Medications reviewed and include: keppra , synthroid , desitin, protonix , 40 mEq KCl x 1 Hypertonic saline  Labs reviewed:  Na 139 CBG 106    NUTRITION - FOCUSED PHYSICAL EXAM:  Flowsheet Row Most Recent Value  Orbital Region No depletion  Upper Arm Region No depletion  Thoracic and Lumbar Region No depletion  Buccal Region Mild depletion  Temple Region Unable to assess  [edema]  Clavicle Bone Region No depletion  Clavicle and Acromion Bone Region No depletion  Scapular Bone Region No depletion  Dorsal Hand No depletion  Patellar Region Moderate depletion  Anterior Thigh Region Moderate depletion  Posterior Calf Region Mild depletion  Edema (RD Assessment) --  [facial]  Hair Reviewed  Eyes Reviewed  Mouth Reviewed  Skin Reviewed  Nails Reviewed     Diet Order:   Diet Order             Diet NPO time specified  Diet effective now                   EDUCATION NEEDS:   Not appropriate for education at this time  Skin:  Skin Assessment: Reviewed RN Assessment (R crain incision/staples; traumatic wound to R arm from fall)  Last BM:  9/4  Height:   Ht Readings from Last 1 Encounters:  03/09/24 5' 4.02 (1.626 m)    Weight:   Wt Readings from Last 1 Encounters:  03/12/24 63.2 kg    BMI:  Body mass index is 23.9 kg/m.  Estimated Nutritional Needs:   Kcal:  1500-1700  Protein:  80-100 grams  Fluid:  > 1.5L/day  Dhara Schepp P., RD, LDN, CNSC See AMiON for contact information

## 2024-03-16 NOTE — Progress Notes (Signed)
 Speech Language Pathology Treatment: Dysphagia  Patient Details Name: Colleen [name] MRN: [id number] DOB: 19-Jun-1951 Today's Date: 03/16/2024 Time: 8869-8860 SLP Time Calculation (min) (ACUTE ONLY): 9 min  Assessment / Plan / Recommendation Clinical Impression  Pt was made NPO after exhibiting decreased alertness, now s/p Cortrak placement. At the time of SLP session, she was sitting upright and interacting with staff members and visitors in the room. She fed herself regular solids and thin liquids without overt s/s of dysphagia or aspiration. Recommend resuming regular diet with thin liquids. SLP will continue following to target dysphagia and cognitive-linguistic goals.    HPI HPI: Colleen [name] is a 73 yo female presenting to ED 8/29 after a fall with headache, neck pain, and nausea. CT shows R scalp/periorbital hematoma, acute SDH along the R cerebral convexity measuring 8 mm thickness with mass effect and 8 mm leftward midline shift as well as an acute SDH along the R aspect of the falx measuring up to 5 mm in thickness, and an epidural hematoma at the craniocervical junction. MRI shows small dorsal epidural hematoma between C1 vertebrae and occiput. Worsening neurostatus 8/31, taken for R craniotomy for subdural hematoma evacuation. PMH includes HTN, asthma, GERD, anxiety/depression, thyroid  garter s/p total thyroidectomy 2013      SLP Plan  Continue with current plan of care          Recommendations  Diet recommendations: Regular;Thin liquid Liquids provided via: Cup;Straw Medication Administration: Whole meds with liquid Supervision: Staff to assist with self feeding;Full supervision/cueing for compensatory strategies Compensations: Minimize environmental distractions;Slow rate;Small sips/bites Postural Changes and/or Swallow Maneuvers: Seated upright 90 degrees                  Oral care BID   Frequent or constant Supervision/Assistance Cognitive communication  deficit (R41.841);Dysphagia, unspecified (R13.10)     Continue with current plan of care     Damien Blumenthal, M.A., CCC-SLP Speech Language Pathology, Acute Rehabilitation Services  Secure Chat preferred [phone]   03/16/2024, 1:17 PM

## 2024-03-16 NOTE — PMR Pre-admission (Shared)
 PMR Admission Coordinator Pre-Admission Assessment  Patient: Colleen [name] is an 73 y.o., female MRN: [id number] DOB: [birth year] Height: 5' 4.02 (162.6 cm) Weight: 63.2 kg  Insurance Information HMO:     PPO:      PCP:      IPA:      80/20:      OTHER:  PRIMARY: Medicare Part A and B      Policy#: [id number]      Subscriber: pt Benefits:  Phone #: passport one source online     Name: 9/4 Eff. Date: a 08/12/2016 and b 3/1/ 2021     Deduct: $1676      Out of Pocket Max: none      Life Max: none CIR: 100%      SNF: 20 full days Outpatient: 80%     Co-Pay: 20% Home Health: 100%      Co-Pay: none DME: 80%     Co-Pay: 20% Providers: pt choice  SECONDARY: [employer] Medicare supplement      Policy#: [id number]     Phone#: [phone]  Financial Counselor:       Phone#:   The "Data Collection Information Summary" for patients in Inpatient Rehabilitation Facilities with attached "Privacy Act Statement-Health Care Records" was provided and verbally reviewed with: Patient and Family  Emergency Contact Information Contact Information     Name Relation Home Work Mobile   Mezgar,[name] Daughter [phone]  [phone]      Other Contacts     Name Relation Home Work Mobile   Mezgar,Kathleen Daughter [phone]  [phone]   Windy Asberry Benne [phone]  [phone]       Current Medical History  Patient Admitting Diagnosis: SDH  History of Present Illness: 73 yo female with history of HTN, asthma, GERD, lethargy/catanoia anxiety and depression who preented to [facility] post fall on 03/09/24 with complaints of headache, neck pain and nausea. CT scan of maxillofacial, head, C-spine revealed right scalp/periorbital hematoma, acute subdural hemorrhage along the right cerebral convexity, measuring up to 8 mm in thickness with mass effect with 8 mm leftward midline shift, acute subdural hemorrhage also present along the right aspect of the falx, measuring up  to 5 mm in thickness, and a epidural hematoma at the craniocervical junction and within the cervical spinal canal, 4 mm. Transferred to [facility] on 03/09/24 for Neurosurgical evaluation and MRI of cervical spine.  MRI showing small dorsal epidural hematoma located between CI vertebrae and occiput.  On 8/31, patient having worsening neurostatus becoming more lethargic. Patient was given 6 mg of ativan  for history catatonia/lethargic with no improvement subsequently patient taken for a craniotomy on 03/11/24 for subdural hematoma evacuation.   Postoperatively on 9/4 with worsening mentation. CT head showed slight worsening SDH with persistent midline shift to the left. EVD placement on 9/4 only transducing.   ***   Complete NIHSS TOTAL: 0  Patient's medical record from [facility] and [facility] has been reviewed by the rehabilitation admission coordinator and physician.  Past Medical History  Past Medical History:  Diagnosis Date   Allergy    Anxiety    Arrhythmia    does not see cardiologist, sees pcp, Dr. 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 Yes  Family History   family history includes Atrial fibrillation in her sister; Cervical cancer in her mother; Cirrhosis in her brother; Fibromyalgia in her sister; Heart disease in her father; Heart failure in her  father; Hypertension in her brother and father; Irritable bowel syndrome in her brother; Prostate cancer in her brother; Seizures in her brother and father.  Current Medications  Current Facility-Administered Medications:    acetaminophen  (TYLENOL ) tablet 650 mg, 650 mg, Per Tube, Q4H PRN **OR** acetaminophen  (TYLENOL ) suppository 650 mg, 650 mg, Rectal, Q4H PRN, Merilee Linsey I, RPH   busPIRone  (BUSPAR ) tablet 7.5 mg, 7.5 mg, Per Tube, BID, Pawar, Rahul, MD, 7.5 mg at 03/16/24 1036   carvedilol  (COREG ) tablet 12.5 mg, 12.5 mg, Per Tube, BID WC, Merilee Linsey I, RPH, 12.5 mg at 03/16/24 1035   Chlorhexidine  Gluconate Cloth 2 % PADS 6 each, 6 each, Topical, Daily, Maree Harder, MD, 6 each at 03/16/24 1036   feeding supplement (OSMOLITE 1.5 CAL) liquid 1,000 mL, 1,000 mL, Per Tube, Continuous, Pawar, Rahul, MD, Last Rate: 40 mL/hr at 03/16/24 1310, 1,000 mL at 03/16/24 1310   feeding supplement (PROSource TF20) liquid 60 mL, 60 mL, Per Tube, BID, Pawar, Rahul, MD, 60 mL at 03/16/24 1309   fentaNYL  (SUBLIMAZE ) injection 25 mcg, 25 mcg, Intravenous, Q1H PRN, Darnella Dorn SAUNDERS, MD   hydrALAZINE  (APRESOLINE ) injection 10 mg, 10 mg, Intravenous, Q1H PRN, Garst, Jonathan R, MD, 10 mg at 03/16/24 1136   hydrALAZINE  (APRESOLINE ) tablet 100 mg, 100 mg, Per Tube, Q8H, Merilee Linsey I, RPH   irbesartan  (AVAPRO ) tablet 300 mg, 300 mg, Per Tube, Daily, Merilee Linsey I, RPH, 300 mg at 03/16/24 1035   labetalol  (NORMODYNE ) injection 10-40 mg, 10-40 mg, Intravenous, Q10 min PRN, Darnella Dorn SAUNDERS, MD, 20 mg at 03/16/24 9056   lamoTRIgine  (LAMICTAL ) tablet 150 mg, 150 mg, Per Tube, Daily, Merilee Linsey I, RPH, 150 mg at 03/16/24 1036   levETIRAcetam  (KEPPRA ) tablet 500 mg, 500 mg, Per Tube, BID, Merilee Linsey I, RPH, 500 mg at 03/16/24 1036   [START ON 03/17/2024] levothyroxine  (SYNTHROID ) tablet 100 mcg, 100 mcg, Per Tube, Q0600, Merilee Linsey I, RPH   liver oil-zinc  oxide (DESITIN)  40 % ointment, , Topical, BID, Pawar, Rahul, MD, Given at 03/16/24 1046   metoCLOPramide  (REGLAN ) injection 5 mg, 5 mg, Intravenous, Q6H PRN, Darnella Dorn SAUNDERS, MD   ondansetron  (ZOFRAN ) tablet 4 mg, 4 mg, Per Tube, Q4H PRN **OR** ondansetron  (ZOFRAN ) injection 4 mg, 4 mg, Intravenous, Q4H PRN, Merilee Linsey I, RPH   Oral care mouth rinse, 15 mL, Mouth Rinse, 4 times per day, Darnella Dorn SAUNDERS, MD, 15 mL at 03/16/24 1309   Oral care mouth rinse, 15 mL, Mouth Rinse, PRN, Darnella Dorn SAUNDERS, MD, 15 mL at 03/15/24 2234   [DISCONTINUED] pantoprazole  (PROTONIX ) EC tablet 40 mg, 40 mg, Oral, QHS **OR** pantoprazole  (PROTONIX ) injection 40 mg, 40 mg, Intravenous, QHS, Paliwal, Aditya, MD   risperiDONE  (RISPERDAL ) tablet 3 mg, 3 mg, Per Tube, QHS,  Merilee Linsey I, [facility]   sodium chloride  (hypertonic) 3 % solution, , Intravenous, Continuous, Pawar, Rahul, MD, Last Rate: 50 mL/hr at 03/16/24 0900, Infusion Verify at 03/16/24 0900   sodium chloride  flush (NS) 0.9 % injection 10-40 mL, 10-40 mL, Intracatheter, Q12H, Garst, Dorn SAUNDERS, MD   sodium chloride  flush (NS) 0.9 % injection 10-40 mL, 10-40 mL, Intracatheter, PRN, Darnella Dorn SAUNDERS, MD   traZODone  (DESYREL ) tablet 50 mg, 50 mg, Per Tube, QHS, Merilee Linsey I, RPH   venlafaxine  (EFFEXOR ) tablet 150 mg, 150 mg, Per Tube, BID WC, Pawar, Rahul, MD, 150 mg at 03/16/24 1035  Patients Current Diet:  Diet Order             Diet regular Room service appropriate? Yes with Assist; Fluid consistency: Thin  Diet effective now                   Precautions / Restrictions Precautions Precautions: Fall Precaution/Restrictions Comments: SBP < 140, aline, evd Restrictions Weight Bearing Restrictions Per Provider Order: No   Has the patient had 2 or more falls or a fall with injury in the past year? Yes  Prior Activity Level Community (5-7x/wk): independent, retired, active and driving  Prior Functional Level Self Care: Did the patient  need help bathing, dressing, using the toilet or eating? Independent  Indoor Mobility: Did the patient need assistance with walking from room to room (with or without device)? Independent  Stairs: Did the patient need assistance with internal or external stairs (with or without device)? Independent  Functional Cognition: Did the patient need help planning regular tasks such as shopping or remembering to take medications? Independent  Patient Information Are you of Hispanic, Latino/a,or Spanish origin?: A. No, not of Hispanic, Latino/a, or Spanish origin What is your race?: A. White Do you need or want an interpreter to communicate with a doctor or health care staff?: 0. No  Patient's Response To:  Health Literacy and Transportation Is the patient able to respond to health literacy and transportation needs?: Yes Health Literacy - How often do you need to have someone help you when you read instructions, pamphlets, or other written material from your doctor or pharmacy?: Never In the past 12 months, has lack of transportation kept you from medical appointments or from getting medications?: No In the past 12 months, has lack of transportation kept you from meetings, work, or from getting things needed for daily living?: No  Home Assistive Devices / Equipment Home Equipment: Grab bars - toilet, Grab bars - tub/shower  Prior Device Use: Indicate devices/aids used by the patient prior to current illness, exacerbation or injury? None of the above  Current Functional Level Cognition  Arousal/Alertness: Awake/alert Overall Cognitive Status: Impaired/Different from baseline Orientation Level: Oriented to person, Oriented to place, Oriented to time, Oriented to situation Attention: Sustained Sustained Attention: Impaired Sustained Attention Impairment: Functional basic Memory: Appears intact Awareness: Impaired Awareness Impairment: Emergent impairment Problem Solving: Impaired Problem  Solving Impairment: Functional basic Executive Function: Reasoning Reasoning: Impaired Reasoning Impairment: Verbal basic, Verbal complex    Extremity Assessment (includes Sensation/Coordination)  Upper Extremity Assessment: Generalized weakness, LUE deficits/detail LUE Deficits / Details: Difficulty holding onto RW L handle, weak grip LUE Coordination: decreased fine motor, decreased gross motor  Lower Extremity Assessment: Defer to PT evaluation RLE Deficits / Details: Strength 5/5 LLE Deficits / Details: Strength 5/5    ADLs  Overall ADL's : Needs assistance/impaired Eating/Feeding: Set up, Sitting Grooming: Set up, Contact guard assist, Sitting  Upper Body Bathing: Contact guard assist, Sitting Lower Body Bathing: Moderate assistance, Sit to/from stand Upper Body Dressing : Contact guard assist, Sitting Lower Body Dressing: Moderate assistance, Sit to/from stand Toilet Transfer: Minimal assistance, Rolling walker (2 wheels), BSC/3in1 Toileting- Clothing Manipulation and Hygiene: Moderate assistance, Sit to/from stand Toileting - Clothing Manipulation Details (indicate cue type and reason): min A balance, min A thoroughness, needing to STS several times for completion of task Functional mobility during ADLs: Minimal assistance, +2 for safety/equipment, +2 for physical assistance    Mobility  Overal bed mobility: Needs Assistance Bed Mobility: Sit to Supine Supine to sit: Mod assist Sit to supine: Total assist, +2 for physical assistance General bed mobility comments: total +2 all aspects, pt assisting 0%    Transfers  Overall transfer level: Needs assistance Equipment used: Rolling walker (2 wheels) Transfers: Bed to chair/wheelchair/BSC Sit to Stand: Min assist Bed to/from chair/wheelchair/BSC transfer type:: Lateral/scoot transfer Stand pivot transfers: Min assist, +2 physical assistance Step pivot transfers: Min assist  Lateral/Scoot Transfers: Total assist, +2 physical  assistance General transfer comment: total +2 for all aspects, pivot towards L for drop arm side of recliner as pt not arousable to participate in transfer. Completely dependent transfer with bilat LE blocking and use of bed pad to perform    Ambulation / Gait / Stairs / Wheelchair Mobility  Ambulation/Gait Ambulation/Gait assistance: Min assist, +2 safety/equipment Gait Distance (Feet): 12 Feet Assistive device: None Gait Pattern/deviations: Step-through pattern, Decreased stride length, Trunk flexed General Gait Details: unable Gait velocity: decreased Gait velocity interpretation: <1.8 ft/sec, indicate of risk for recurrent falls    Posture / Balance Balance Overall balance assessment: Needs assistance Sitting-balance support: Feet supported Sitting balance-Leahy Scale: Fair Standing balance support: Reliant on assistive device for balance Standing balance-Leahy Scale: Poor Standing balance comment: Posterior LOB with no external support    Special considerations/life events     Previous Home Environment  Living Arrangements: Alone  Lives With: Alone Available Help at Discharge: Family, Available 24 hours/day Type of Home: House Home Layout: One level Home Access: Stairs to enter [employer] of Steps: 2 Bathroom Shower/Tub: Engineer, manufacturing systems: Standard Bathroom Accessibility: Yes How Accessible: Accessible via walker Home Care Services: No  Discharge Living Setting Plans for Discharge Living Setting: Patient's home, House Type of Home at Discharge: House Discharge Home Layout: One level Discharge Home Access: Stairs to enter Entrance Stairs-Rails: None Entrance Stairs-Number of Steps: 2 Discharge Bathroom Shower/Tub: Tub/shower unit Discharge Bathroom Toilet: Standard Discharge Bathroom Accessibility: Yes How Accessible: Accessible via walker Does the patient have any problems obtaining your medications?: No  Social/Family/Support  Systems Patient Roles: Parent Contact Information: local daughter, Rollo Caregiver Availability: 24/7 Discharge Plan Discussed with Primary Caregiver: Yes Is Caregiver In Agreement with Plan?: Yes Does Caregiver/Family have Issues with Lodging/Transportation while Pt is in Rehab?: No  Goals Patient/Family Goal for Rehab: supervision PT, supervision to min OT, supervision SLP Expected length of stay: *** Pt/Family Agrees to Admission and willing to participate: Yes Program Orientation Provided & Reviewed with Pt/Caregiver Including Roles  & Responsibilities: Yes  Decrease burden of Care through IP rehab admission: n/a  Possible need for SNF placement upon discharge: not anticipated  Patient Condition: I have reviewed medical records from [facility], spoken with patient and family member. I met with patient at the bedside for inpatient rehabilitation assessment.  Patient will benefit from ongoing PT, OT, and SLP, can actively participate in 3 hours of therapy a day 5  days of the week, and can make measurable gains during the admission.  Patient will also benefit from the coordinated team approach during an Inpatient Acute Rehabilitation admission.  The patient will receive intensive therapy as well as Rehabilitation physician, nursing, social worker, and care management interventions.  Due to {due un:6958565} the patient requires 24 hour a day rehabilitation nursing.  The patient is currently *** with mobility and basic ADLs.  Discharge setting and therapy post discharge at home with home health is anticipated.  Patient has agreed to participate in the Acute Inpatient Rehabilitation Program and will admit {Time; today/tomorrow:10263}.  Preadmission Screen Completed By:  Alison Heron Lot, RN MSN 03/16/2024 3:11 PM ______________________________________________________________________   Discussed status with Dr. PIERRETTE on *** at *** and received approval for admission  today.  Admission Coordinator:  Alison Heron Lot, RN MSN time PIERRETTEPattricia ***   Assessment/Plan: Diagnosis: *** Does the need for close, 24 hr/day Medical supervision in concert with the patient's rehab needs make it unreasonable for this patient to be served in a less intensive setting? {yes_no_potentially:3041433} Co-Morbidities requiring supervision/potential complications: *** Due to {due un:6958565}, does the patient require 24 hr/day rehab nursing? {yes_no_potentially:3041433} Does the patient require coordinated care of a physician, rehab nurse, PT, OT, and SLP to address physical and functional deficits in the context of the above medical diagnosis(es)? {yes_no_potentially:3041433} Addressing deficits in the following areas: {deficits:3041436} Can the patient actively participate in an intensive therapy program of at least 3 hrs of therapy 5 days a week? {yes_no_potentially:3041433} The potential for patient to make measurable gains while on inpatient rehab is {potential:3041437} Anticipated functional outcomes upon discharge from inpatient rehab: {functional outcomes:304600100} PT, {functional outcomes:304600100} OT, {functional outcomes:304600100} SLP Estimated rehab length of stay to reach the above functional goals is: *** Anticipated discharge destination: {anticipated dc setting:21604} 10. Overall Rehab/Functional Prognosis: {potential:3041437}   MD Signature: ***

## 2024-03-16 NOTE — Progress Notes (Signed)
   Inpatient Rehabilitation Admissions Coordinator   Met at bedside with patient and sister, Sharyne. Noted events overnight. We will follow up next week to assist with dispo planning when appropriate.  Heron Leavell, RN, MSN Rehab Admissions Coordinator [phone] 03/16/2024 11:33 AM

## 2024-03-16 NOTE — Procedures (Signed)
 Cortrak  Person Inserting Tube:  Jackson Coffield T, RD Tube Type:  Cortrak - 43 inches Tube Size:  10 Tube Location:  Left nare Secured by: Bridle Initial Placement:  Gastric Technique Used to Measure Tube Placement:  Marking at nare/corner of mouth Cortrak Secured At:  66 cm Initial Placement Verification:  Cortrak device (Registered Dieticians Only)  Cortrak Tube Team Note:  Consult received to place a Cortrak feeding tube.   No x-ray is required. RN may begin using tube.   If the tube becomes dislodged please keep the tube and contact the Cortrak team at [email or web address] for replacement.  If after hours and replacement cannot be delayed, place a NG tube and confirm placement with an abdominal x-ray.    Trude Ned RD, LDN Contact via [employer].

## 2024-03-16 NOTE — Progress Notes (Addendum)
 1400:  Patient very lethargic and minimal responsive.  Neurosurgery notified.  RN to continue to monitor.  CCM to be notified also.    1700:  Patient is awake and able to answer all orientation questions, moving all extremities and following commands.

## 2024-03-16 NOTE — Progress Notes (Signed)
 Peripherally Inserted Central Catheter Placement  The IV Nurse has discussed with the patient and/or persons authorized to consent for the patient, the purpose of this procedure and the potential benefits and risks involved with this procedure.  The benefits include less needle sticks, lab draws from the catheter, and the patient may be discharged home with the catheter. Risks include, but not limited to, infection, bleeding, blood clot (thrombus formation), and puncture of an artery; nerve damage and irregular heartbeat and possibility to perform a PICC exchange if needed/ordered by physician.  Alternatives to this procedure were also discussed.  Bard Power PICC patient education guide, fact sheet on infection prevention and patient information card has been provided to patient /or left at bedside. Telephone consent obtained by primary RN prior to placement.  PICC Placement Documentation  PICC Double Lumen 03/16/24 Right Brachial 36 cm (Active)  Indication for Insertion or Continuance of Line Limited venous access - need for IV therapy >5 days (PICC only);Vasoactive infusions 03/16/24 1447  Exposed Catheter (cm) 0 cm 03/16/24 1447  Site Assessment Clean, Dry, Intact 03/16/24 1447  Lumen #1 Status Flushed;Saline locked;Blood return noted 03/16/24 1447  Lumen #2 Status Flushed;Saline locked;Blood return noted 03/16/24 1447  Dressing Type Transparent;Securing device 03/16/24 1447  Dressing Status Antimicrobial disc/dressing in place 03/16/24 1447  Line Care Connections checked and tightened 03/16/24 1447  Line Adjustment (NICU/IV Team Only) No 03/16/24 1447  Dressing Intervention New dressing;Adhesive placed at insertion site (IV team only) 03/16/24 1447  Dressing Change Due 03/23/24 03/16/24 1447       Colleen  [name] 03/16/2024, 2:54 PM

## 2024-03-17 LAB — BASIC METABOLIC PANEL WITH GFR
Anion gap: 5 (ref 5–15)
BUN: 19 mg/dL (ref 8–23)
CO2: 20 mmol/L — ABNORMAL LOW (ref 22–32)
Calcium: 7.9 mg/dL — ABNORMAL LOW (ref 8.9–10.3)
Chloride: 122 mmol/L — ABNORMAL HIGH (ref 98–111)
Creatinine, Ser: 0.67 mg/dL (ref 0.44–1.00)
GFR, Estimated: >60 mL/min (ref >60)
Glucose, Bld: 125 mg/dL — ABNORMAL HIGH (ref 70–99)
Potassium: 3.5 mmol/L (ref 3.5–5.1)
Sodium: 147 mmol/L — ABNORMAL HIGH (ref 135–145)

## 2024-03-17 LAB — CBC
HCT: 23.5 % — ABNORMAL LOW (ref 36.0–46.0)
HCT: 22.2 % — ABNORMAL LOW (ref 36.0–46.0)
Hemoglobin: 7.6 g/dL — ABNORMAL LOW (ref 12.0–15.0)
Hemoglobin: 7 g/dL — ABNORMAL LOW (ref 12.0–15.0)
MCH: 32.3 pg (ref 26.0–34.0)
MCH: 32.9 pg (ref 26.0–34.0)
MCHC: 32.3 g/dL (ref 30.0–36.0)
MCHC: 31.5 g/dL (ref 30.0–36.0)
MCV: 100 fL (ref 80.0–100.0)
MCV: 104.2 fL — ABNORMAL HIGH (ref 80.0–100.0)
Platelets: 210 K/uL (ref 150–400)
Platelets: 208 K/uL (ref 150–400)
RBC: 2.35 MIL/uL — ABNORMAL LOW (ref 3.87–5.11)
RBC: 2.13 MIL/uL — ABNORMAL LOW (ref 3.87–5.11)
RDW: 13.2 % (ref 11.5–15.5)
RDW: 13.6 % (ref 11.5–15.5)
WBC: 5 K/uL (ref 4.0–10.5)
WBC: 5.7 K/uL (ref 4.0–10.5)
nRBC: 0 % (ref 0.0–0.2)
nRBC: 0 % (ref 0.0–0.2)

## 2024-03-17 LAB — SODIUM
Sodium: 150 mmol/L — ABNORMAL HIGH (ref 135–145)
Sodium: 151 mmol/L — ABNORMAL HIGH (ref 135–145)
Sodium: 151 mmol/L — ABNORMAL HIGH (ref 135–145)

## 2024-03-17 LAB — GLUCOSE, CAPILLARY
Glucose-Capillary: 101 mg/dL — ABNORMAL HIGH (ref 70–99)
Glucose-Capillary: 111 mg/dL — ABNORMAL HIGH (ref 70–99)
Glucose-Capillary: 117 mg/dL — ABNORMAL HIGH (ref 70–99)
Glucose-Capillary: 119 mg/dL — ABNORMAL HIGH (ref 70–99)
Glucose-Capillary: 123 mg/dL — ABNORMAL HIGH (ref 70–99)
Glucose-Capillary: 152 mg/dL — ABNORMAL HIGH (ref 70–99)

## 2024-03-17 LAB — PHOSPHORUS: Phosphorus: 2.5 mg/dL (ref 2.5–4.6)

## 2024-03-17 LAB — MAGNESIUM: Magnesium: 2.1 mg/dL (ref 1.7–2.4)

## 2024-03-17 MED ORDER — POTASSIUM CHLORIDE 20 MEQ PO PACK
40.0000 meq | PACK | Freq: Once | ORAL | Status: AC
  Administered 2024-03-17: 40 meq
  Filled 2024-03-17: qty 2

## 2024-03-17 MED ORDER — POLYETHYLENE GLYCOL 3350 17 G PO PACK
17.0000 g | PACK | Freq: Every day | ORAL | Status: DC
  Administered 2024-03-17 – 2024-03-20 (×3): 17 g
  Filled 2024-03-17 (×3): qty 1

## 2024-03-17 MED ORDER — SENNOSIDES-DOCUSATE SODIUM 8.6-50 MG PO TABS
1.0000 | ORAL_TABLET | Freq: Every day | ORAL | Status: DC
  Administered 2024-03-17 – 2024-03-19 (×3): 1
  Filled 2024-03-17 (×4): qty 1

## 2024-03-17 NOTE — Consult Note (Signed)
 NAME:  Colleen [name], MRN:  [id number], DOB:  May 24, 1951, LOS: 5 ADMISSION DATE:  03/09/2024, CONSULTATION DATE:  03/12/24  REFERRING MD:  MD Malcolm CHIEF COMPLAINT:  Subdural Hematoma   History of Present Illness:  Pt is a 73 yr old female with significant pmhx of HTN, Asthma, GERD, lethargy/catanoia anxiety, and depression who presented to [facility] post fall with complaints of headache, neck pain, and nausea on 03/09/24. CT scans of maxillofacial, head, C-Spine were obtained revealed, right scalp/periorbital hematoma, acute subdural hemorrhage along the right cerebral convexity, measuring up to 8 mm in thickness with mass effect and with 8 mm leftward midline shift, acute subdural hemorrhage also present along the right aspect of the falx, measuring up to 5 mm in thickness, and a Epidural hematoma at the craniocervical junction and within the cervical spinal canal, 4mm. Patient transferred to [facility] for further neuro evaluation and MRI of cervical spine. MRI showing small dorsal epidural hematoma located between CI vertebrae and occiput.  On 8/31, patient having worsening neurostatus becoming more lethargic. Patient was given 6mg  of ativan  for history catatonia/lethargic with no improvement subsequently patient taken for a craniotomy for subdural hematoma evacuation.   Pertinent  Medical History   Past Medical History:  Diagnosis Date   Allergy    Anxiety    Arrhythmia    does not see cardiologist, sees pcp, Dr. 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  CT head showed worsening SDH.  Repeat CT head with stable SDH.  Discussed with neurosurgery, expected postop changes.  No surgical interventions.  EEG negative. 9/3: all drains are out.  9/4: Worsening mentation.  Stat CT head showed slight worsening SDH [9 mm] with persistent midline shift to left, increased cerebral edema with crowding of basilar cisterns increased effacement of fourth ventricle and partial effacement of prepontine cistern and inferior descent of cerebral tonsils.  Status post EVD placement.  Only transdusing.  Interim History / Subjective:  Currently alert and oriented x 2.  EVD in place.  Currently transducing.  Objective    Blood pressure 130/72, pulse 79, temperature 98.1 F (36.7 C), temperature source Oral, resp. rate (!) 23, height 5' 4.02 (1.626 m), weight 63.1 kg, SpO2 98%.        Intake/Output Summary (Last 24 hours) at 03/17/2024 1035 Last data filed at 03/17/2024 0900 Gross per 24 hour  Intake 2637.93 ml  Output 1350 ml  Net 1287.93 ml   Filed Weights   03/09/24 2030 03/12/24 0238 03/17/24 0500  Weight: 65 kg 63.2 kg 63.1 kg    Examination:  General: acute on chronic older female, sitting in chair s/p crani, no distress  HENT: Normocephalic, moist mucous membrane.  Staples from craniotomy on right.  EVD in place Lungs: Clear to auscultation bilaterally. Cardiovascular: Heart rate regular in rate and rhythm.  No murmurs appreciated. Abdomen: bs, soft  Neuro: Following commands.  Alert and oriented x 3.  Moving all 4 extremities with strength 5/5.  Resolved problem list   Assessment and Plan  Right  Craniotomy for SDH Evacuation - 8/31  Hx of Lethargy/Catatonia , diagnosed as functional catatonia Multiple events in the hospital suggestive of delirium versus functional catatonia 8/29 CT of maxillofacial, head, and C-spine- acute subdural hemorrhage along the right cerebral convexity, measuring up to 8 mm in thickness with mass effect and with 8 mm leftward midline shift, acute subdural hemorrhage also present along the right aspect of the falx, measuring up to 5 mm in thickness, and a Epidural hematoma at the craniocervical junction and within the cervical spinal canal, 4mm. MRI showing small dorsal epidural hematoma located between CI vertebrae and occiput. Repeat CT head 9/2 with slight worsening of SDH from 4 mm to 7 mm.  Repeat CT head 9/3 stable SDH. CT head 9/4: SDH of 9 mm slight worsening from 8 mm.  Cerebral edema. Status post EVD on 9/4.  Both JP drain and subdural drain have been removed. - SBP goal < 140  - Sodium goal 145-155 per neurosurgery.  Started on 3% normal saline through PICC line.  Check BMP Q6.  -  Now on higher dose Coreg , ARB and added hydralazine .   - Holding anticoag per neurosurg recs. DVT prophylaxis can be resumed on 9/7. - Continue Keppra  BID  -Psych meds as below for catatonia.  Hyponatremia  Low urine sodium.  Urine osmolality not available.  Likely hypovolemic related hyponatremia. Currently on 3% saline as above.  HTN  P:  SBP goal < 140 as above See above.  GERD P: P.o. Protonix .  Anemia: - Drop in hemoglobin by 2 points.  Has done this before.  No current bleeding source.  Recheck CBC.  Anxiety  Depression  P: Supportive care  Continue home Effexor  Lamictal  and risperidone .  DVT prophylaxis: SCDs.  DVT chemo prophylaxis can be started on 9/7 IVF 3%  Continue ICU monitoring for EVD monitoring.   Labs   CBC: Recent Labs  Lab 03/12/24 0419 03/13/24 0448 03/14/24 0856 03/15/24 0533 03/16/24 0534 03/17/24 0242  WBC 8.3 9.0 9.1  5.9 5.5 5.0  NEUTROABS 7.3  --   --   --   --   --   HGB 10.0* 8.8* 9.8* 9.3* 9.5* 7.6*  HCT 28.0* 25.5* 27.9* 27.4* 28.6* 23.5*  MCV 93.3 94.8 93.6 97.2 96.9 100.0  PLT 178 185 244 200 236 210    Basic Metabolic Panel: Recent Labs  Lab 03/15/24 0533 03/15/24 2214 03/16/24 0534 03/16/24 0759 03/16/24 1132 03/16/24 1513 03/16/24 2036 03/17/24 0242  NA 135 135  133* 140 139 138 139 145 147*  K 3.5 3.8 3.7  --  3.8  --   --  3.5  CL 103 102 108  --  108  --   --  122*  CO2 20* 22 18*  --  18*  --   --  20*  GLUCOSE 98 106* 94  --  93  --   --  125*  BUN 6* 9 9  --  11  --   --  19  CREATININE 0.68 0.78  0.70  --  0.74  --   --  0.67  CALCIUM  8.0* 8.0* 8.0*  --  8.0*  --   --  7.9*  MG  --   --   --   --  1.9  --   --  2.1  PHOS  --   --   --   --  3.9  --   --  2.5   GFR: Estimated Creatinine Clearance: 54.9 mL/min (by C-G formula based on SCr of 0.67 mg/dL). Recent Labs  Lab 03/14/24 0856 03/15/24 0533 03/16/24 0534 03/17/24 0242  WBC 9.1 5.9 5.5 5.0    Liver Function Tests: No results for input(s): AST, ALT, ALKPHOS, BILITOT, PROT, ALBUMIN in the last 168 hours. No results for input(s): LIPASE, AMYLASE in the last 168 hours. No results for input(s): AMMONIA in the last 168 hours.  ABG    Component Value Date/Time   PHART 7.333 (L) 03/11/2024 2338   PCO2ART 36.2 03/11/2024 2338   PO2ART 220 (H) 03/11/2024 2338   HCO3 19.6 (L) 03/11/2024 2338   TCO2 21 (L) 03/11/2024 2338   ACIDBASEDEF 6.0 (H) 03/11/2024 2338   O2SAT 100 03/11/2024 2338     Coagulation Profile: No results for input(s): INR, PROTIME in the last 168 hours.  Cardiac Enzymes: No results for input(s): CKTOTAL, CKMB, CKMBINDEX, TROPONINI in the last 168 hours.  HbA1C: Hgb A1c MFr Bld  Date/Time Value Ref Range Status  10/17/2021 06:21 AM 5.1 4.8 - 5.6 % Final    Comment:    (NOTE) Pre diabetes:          5.7%-6.4%  Diabetes:              >6.4%  Glycemic  control for   <7.0% adults with diabetes   09/09/2021 04:44 PM 4.8 4.8 - 5.6 % Final    Comment:    (NOTE) Pre diabetes:          5.7%-6.4%  Diabetes:              >6.4%  Glycemic control for   <7.0% adults with diabetes     CBG: Recent Labs  Lab 03/14/24 0307 03/16/24 1942 03/16/24 2339 03/17/24 0340 03/17/24 0737  GLUCAP 106* 94 112* 123* 119*    Review of Systems:   See HPI   Past Medical History:  She,  has a past medical history of Allergy, Anxiety, Arrhythmia, Asthma, Bronchitis, Depression, Family history of anesthesia complication, GERD (gastroesophageal reflux disease), Goiter (03/17/2012), Heartburn, Herpes, Hypertension, Seizures (HCC), and Urinary tract infection.   Surgical History:   Past Surgical History:  Procedure Laterality Date   CESAREAN SECTION     x 2   COLONOSCOPY     CRANIOTOMY Right 03/11/2024   Procedure: CRANIOTOMY HEMATOMA EVACUATION SUBDURAL;  Surgeon: Darnella Dorn SAUNDERS, MD;  Location: [facility] OR;  Service: Neurosurgery;  Laterality: Right;   ELBOW SURGERY     right   KNEE ARTHROSCOPY     right   PILONIDAL CYST EXCISION     spinal cystectomy     where pilonidal cyst was   THYROIDECTOMY  04/07/2012   Procedure: THYROIDECTOMY;  Surgeon: Sherlean JINNY Laughter, MD;  Location: [facility] OR;  Service: General;  Laterality: N/A;        Social History:   reports that she quit smoking about 24 years ago. Her smoking use included cigarettes. She has never used smokeless tobacco. She reports that she does not drink alcohol and does not use  drugs.   Family History:  Her family history includes Atrial fibrillation in her sister; Cervical cancer in her mother; Cirrhosis in her brother; Fibromyalgia in her sister; Heart disease in her father; Heart failure in her father; Hypertension in her brother and father; Irritable bowel syndrome in her brother; Prostate cancer in her brother; Seizures in her brother and father. There is no history of Breast cancer.    Allergies Allergies  Allergen Reactions   Amlodipine  Other (See Comments)    Flushing, tingling in face, sore throat  (Norvasc )   Cyclobenzaprine Hcl Other (See Comments)    Seizures    Aripiprazole Other (See Comments)   Cymbalta  [Duloxetine  Hcl] Other (See Comments)    Made the depression worse   Tape Rash    Blisters     Home Medications  Prior to Admission medications   Medication Sig Start Date End Date Taking? Authorizing Provider  acyclovir  (ZOVIRAX ) 400 MG tablet Take 400 mg by mouth 3 (three) times daily as needed (Flare ups). 01/09/24  Yes [provider]  busPIRone  (BUSPAR ) 7.5 MG tablet Take 7.5 mg by mouth 2 (two) times daily.   Yes [provider]  lamoTRIgine  (LAMICTAL ) 150 MG tablet Take 150 mg by mouth daily.   Yes [provider]  levothyroxine  (SYNTHROID ) 100 MCG tablet Take 1 tablet (100 mcg total) by mouth daily. 10/10/21  Yes Von Bellis, MD  metoprolol  succinate (TOPROL -XL) 25 MG 24 hr tablet Take 25 mg by mouth daily.   Yes [provider]  olmesartan (BENICAR) 20 MG tablet Take 20 mg by mouth daily. 01/07/24  Yes [provider]  risperiDONE  (RISPERDAL ) 3 MG tablet Take 3 mg by mouth at bedtime. 03/06/24  Yes [provider]  traZODone  (DESYREL ) 50 MG tablet Take 50 mg by mouth at bedtime. 03/06/24  Yes [provider]  venlafaxine  XR (EFFEXOR -XR) 150 MG 24 hr capsule Take 300 mg by mouth daily.   Yes [provider]     CRITICAL CARE Performed by: Sammi JONETTA Fredericks.     Total critical care time: 35 minutes   Critical care time was exclusive of separately billable procedures and treating other patients.   Critical care was necessary to treat or prevent imminent or life-threatening deterioration.   Critical care was time spent personally by me on the following activities: development of treatment plan with patient and/or surrogate as well as nursing, discussions with consultants,  evaluation of patient's response to treatment, examination of patient, obtaining history from patient or surrogate, ordering and performing treatments and interventions, ordering and review of laboratory studies, ordering and review of radiographic studies, pulse oximetry, re-evaluation of patient's condition and participation in multidisciplinary rounds.  Sammi JONETTA Fredericks, MD Pulmonary, Critical Care and Sleep Attending.  Pager: [phone]  03/17/2024, 10:35 AM

## 2024-03-17 NOTE — Progress Notes (Signed)
 Patient ID: Colleen [name], female   DOB: 1950-09-16, 73 y.o.   MRN: [id number] Patient is more alert and awake at present.  She does have periods of time for her mental status varies.  IVCs in place and no drainage blood pressures have been normal.  Continue to observe over this weekend.

## 2024-03-18 ENCOUNTER — Inpatient Hospital Stay (HOSPITAL_COMMUNITY)

## 2024-03-18 DIAGNOSIS — M7989 Other specified soft tissue disorders: Secondary | ICD-10-CM

## 2024-03-18 LAB — GLUCOSE, CAPILLARY
Glucose-Capillary: 158 mg/dL — ABNORMAL HIGH (ref 70–99)
Glucose-Capillary: 113 mg/dL — ABNORMAL HIGH (ref 70–99)
Glucose-Capillary: 110 mg/dL — ABNORMAL HIGH (ref 70–99)
Glucose-Capillary: 111 mg/dL — ABNORMAL HIGH (ref 70–99)
Glucose-Capillary: 97 mg/dL (ref 70–99)
Glucose-Capillary: 141 mg/dL — ABNORMAL HIGH (ref 70–99)
Glucose-Capillary: 104 mg/dL — ABNORMAL HIGH (ref 70–99)

## 2024-03-18 LAB — IRON AND TIBC
Iron: 36 ug/dL (ref 28–170)
Saturation Ratios: 20 % (ref 10.4–31.8)
TIBC: 182 ug/dL — ABNORMAL LOW (ref 250–450)
UIBC: 146 ug/dL

## 2024-03-18 LAB — BASIC METABOLIC PANEL WITH GFR
Anion gap: 7 (ref 5–15)
BUN: 17 mg/dL (ref 8–23)
CO2: 21 mmol/L — ABNORMAL LOW (ref 22–32)
Calcium: 8.1 mg/dL — ABNORMAL LOW (ref 8.9–10.3)
Chloride: 127 mmol/L — ABNORMAL HIGH (ref 98–111)
Creatinine, Ser: 0.67 mg/dL (ref 0.44–1.00)
GFR, Estimated: >60 mL/min (ref >60)
Glucose, Bld: 130 mg/dL — ABNORMAL HIGH (ref 70–99)
Potassium: 3.7 mmol/L (ref 3.5–5.1)
Sodium: 155 mmol/L — ABNORMAL HIGH (ref 135–145)

## 2024-03-18 LAB — SODIUM
Sodium: 154 mmol/L — ABNORMAL HIGH (ref 135–145)
Sodium: 155 mmol/L — ABNORMAL HIGH (ref 135–145)
Sodium: 157 mmol/L — ABNORMAL HIGH (ref 135–145)
Sodium: 150 mmol/L — ABNORMAL HIGH (ref 135–145)

## 2024-03-18 LAB — CBC
HCT: 23.3 % — ABNORMAL LOW (ref 36.0–46.0)
HCT: 23.1 % — ABNORMAL LOW (ref 36.0–46.0)
Hemoglobin: 7.4 g/dL — ABNORMAL LOW (ref 12.0–15.0)
Hemoglobin: 7.3 g/dL — ABNORMAL LOW (ref 12.0–15.0)
MCH: 32.7 pg (ref 26.0–34.0)
MCH: 32.9 pg (ref 26.0–34.0)
MCHC: 31.8 g/dL (ref 30.0–36.0)
MCHC: 31.6 g/dL (ref 30.0–36.0)
MCV: 103.1 fL — ABNORMAL HIGH (ref 80.0–100.0)
MCV: 104.1 fL — ABNORMAL HIGH (ref 80.0–100.0)
Platelets: 206 K/uL (ref 150–400)
Platelets: 216 K/uL (ref 150–400)
RBC: 2.26 MIL/uL — ABNORMAL LOW (ref 3.87–5.11)
RBC: 2.22 MIL/uL — ABNORMAL LOW (ref 3.87–5.11)
RDW: 13.9 % (ref 11.5–15.5)
RDW: 13.9 % (ref 11.5–15.5)
WBC: 5.3 K/uL (ref 4.0–10.5)
WBC: 6.6 K/uL (ref 4.0–10.5)
nRBC: 0.8 % — ABNORMAL HIGH (ref 0.0–0.2)
nRBC: 0.5 % — ABNORMAL HIGH (ref 0.0–0.2)

## 2024-03-18 LAB — MAGNESIUM: Magnesium: 2 mg/dL (ref 1.7–2.4)

## 2024-03-18 LAB — VITAMIN B12: Vitamin B-12: 448 pg/mL (ref 180–914)

## 2024-03-18 LAB — FERRITIN: Ferritin: 87 ng/mL (ref 11–307)

## 2024-03-18 LAB — FOLATE: Folate: 9.2 ng/mL (ref >5.9)

## 2024-03-18 LAB — PHOSPHORUS: Phosphorus: 2.6 mg/dL (ref 2.5–4.6)

## 2024-03-18 MED ORDER — HEPARIN (PORCINE) 25000 UT/250ML-% IV SOLN
1050.0000 [IU]/h | INTRAVENOUS | Status: DC
  Administered 2024-03-18: 650 [IU]/h via INTRAVENOUS
  Administered 2024-03-20: 1050 [IU]/h via INTRAVENOUS
  Filled 2024-03-18 (×2): qty 250

## 2024-03-18 MED ORDER — POTASSIUM CHLORIDE 20 MEQ PO PACK
40.0000 meq | PACK | Freq: Once | ORAL | Status: AC
  Administered 2024-03-18: 40 meq
  Filled 2024-03-18: qty 2

## 2024-03-18 NOTE — Progress Notes (Signed)
 ANTICOAGULATION CONSULT NOTE  Pharmacy Consult for heparin  Indication: DVT  Allergies  Allergen Reactions   Amlodipine  Other (See Comments)    Flushing, tingling in face, sore throat  (Norvasc )   Cyclobenzaprine Hcl Other (See Comments)    Seizures    Aripiprazole Other (See Comments)   Cymbalta  [Duloxetine  Hcl] Other (See Comments)    Made the depression worse   Tape Rash    Blisters    Patient Measurements: Height: 5' 4.02 (162.6 cm) Weight: 69.4 kg (153 lb) IBW/kg (Calculated) : 54.74 Heparin  Dosing Weight: 65 kg  Vital Signs: Temp: 97.8 F (36.6 C) (09/07 1537) Temp Source: Axillary (09/07 1537) BP: 126/88 (09/07 1800) Pulse Rate: 93 (09/07 1800)  Labs: Recent Labs    03/16/24 1132 03/17/24 0242 03/17/24 1426 03/18/24 0517 03/18/24 1348  HGB  --  7.6* 7.0* 7.4* 7.3*  HCT  --  23.5* 22.2* 23.3* 23.1*  PLT  --  210 208 206 216  CREATININE 0.74 0.67  --  0.67  --     Estimated Creatinine Clearance: 60.8 mL/min (by C-G formula based on SCr of 0.67 mg/dL).  Medical History: Past Medical History:  Diagnosis Date   Allergy    Anxiety    Arrhythmia    does not see cardiologist, sees pcp, Dr. 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  Neurosurgery cleared to start DVT ppx 9/7.  Now with upper extremity DVT, pharmacy consulted for heparin  dosing.  No anticoagulation PTA.  9/7 upper extremity doppler with partially occlusive acute DVT of R subclavian and R axillary veins and age indeterminate superfiical VTE L cephalic and basilic veins.  Hgb 7.3 (recent drop on 9/6, previously 9's), pltc 216.  No bolus per neuro, plan to target lower end of goal with anemia and ~7d out from craniotomy.    Goal of Therapy:  Heparin  level 0.3-0.5 units/ml Monitor platelets by anticoagulation protocol: Yes   Plan:  START heparin  infusion 650 units/hr 8 hour anti-Xa level Daily CBC, anti-Xa level Monitor for s/sx of bleeding  Maurilio Fila, PharmD Clinical Pharmacist 03/18/2024  7:30 PM

## 2024-03-18 NOTE — Consult Note (Signed)
 NAME:  Colleen [name], MRN:  [id number], DOB:  01-03-1951, LOS: 6 ADMISSION DATE:  03/09/2024, CONSULTATION DATE:  03/12/24  REFERRING MD:  MD Malcolm CHIEF COMPLAINT:  Subdural Hematoma   History of Present Illness:  Pt is a 73 yr old female with significant pmhx of HTN, Asthma, GERD, lethargy/catanoia anxiety, and depression who presented to [facility] post fall with complaints of headache, neck pain, and nausea on 03/09/24. CT scans of maxillofacial, head, C-Spine were obtained revealed, right scalp/periorbital hematoma, acute subdural hemorrhage along the right cerebral convexity, measuring up to 8 mm in thickness with mass effect and with 8 mm leftward midline shift, acute subdural hemorrhage also present along the right aspect of the falx, measuring up to 5 mm in thickness, and a Epidural hematoma at the craniocervical junction and within the cervical spinal canal, 4mm. Patient transferred to [facility] for further neuro evaluation and MRI of cervical spine. MRI showing small dorsal epidural hematoma located between CI vertebrae and occiput.  On 8/31, patient having worsening neurostatus becoming more lethargic. Patient was given 6mg  of ativan  for history catatonia/lethargic with no improvement subsequently patient taken for a craniotomy for subdural hematoma evacuation.   Pertinent  Medical History   Past Medical History:  Diagnosis Date   Allergy    Anxiety    Arrhythmia    does not see cardiologist, sees pcp, Dr. 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  CT head showed worsening SDH.  Repeat CT head with stable SDH.  Discussed with neurosurgery, expected postop changes.  No surgical interventions.  EEG negative. 9/3: all drains are out.  9/4: Worsening mentation.  Stat CT head showed slight worsening SDH [9 mm] with persistent midline shift to left, increased cerebral edema with crowding of basilar cisterns increased effacement of fourth ventricle and partial effacement of prepontine cistern and inferior descent of cerebral tonsils.  Status post EVD placement.  Only transdusing.  Interim History / Subjective:  Alert and oriented x 3.  EVD in place and just transducing.  Intermittently getting delirious.  Objective    Blood pressure 122/61, pulse 69, temperature 98.7 F (37.1 C), temperature source Oral, resp. rate (!) 38, height 5' 4.02 (1.626 m), weight 69.4 kg, SpO2 97%.        Intake/Output Summary (Last 24 hours) at 03/18/2024 1047 Last data filed at 03/18/2024 0800 Gross per 24 hour  Intake 2215.4 ml  Output 800 ml  Net 1415.4 ml   Filed Weights   03/12/24 0238 03/17/24 0500 03/18/24 0406  Weight: 63.2 kg 63.1 kg 69.4 kg  Examination: General: Elderly female, sitting in chair s/p crani, no distress  HENT: Normocephalic, moist mucous membrane.  Staples from craniotomy on right.  EVD in place Lungs: Clear to auscultation bilaterally. Cardiovascular: Heart rate regular in rate and rhythm.  No murmurs appreciated. Abdomen: bs, soft  Neuro: Following commands.  Alert and oriented x 3.  Moving all 4 extremities with strength 5/5.  Resolved problem list   Assessment and Plan  Right  Craniotomy for SDH Evacuation - 8/31  Hx of Lethargy/Catatonia , diagnosed as functional catatonia Multiple events in the hospital suggestive of delirium versus functional catatonia 8/29 CT of maxillofacial, head, and C-spine- acute subdural hemorrhage along the right cerebral convexity, measuring up to 8 mm in thickness with mass effect and with 8 mm leftward midline shift, acute subdural hemorrhage also present along the right aspect of the falx, measuring up to 5 mm in thickness, and a Epidural hematoma at the craniocervical junction and within the cervical spinal canal, 4mm. MRI showing small dorsal epidural hematoma located between CI vertebrae and occiput. Repeat CT head 9/2 with slight worsening of SDH from 4 mm to 7 mm.  Repeat CT head 9/3 stable SDH. CT head 9/4: SDH of 9 mm slight worsening from 8 mm.  Cerebral edema. Status post EVD on 9/4.  Both JP drain and subdural drain have been removed. - SBP goal < 140  - Sodium goal 145-155 per neurosurgery.  Started on 3% normal saline through PICC line.  Check BMP Q6.  -  Now on higher dose Coreg , ARB and added hydralazine .   -Chemo DVT prophylaxis resumed today. - Continue Keppra  BID  -Psych meds as below for catatonia.  Hyponatremia  Low urine sodium.  Urine osmolality not available.  Likely hypovolemic related hyponatremia. Currently on 3% saline as above.  HTN  P:  SBP goal < 140 as above See above.  GERD P: P.o. Protonix .  Anemia: -Persistently low hemoglobin.  No bleeding.  Hemoglobin stable.  Likely because of multiple blood draws. - Checking iron studies vitamin B12 and folate. - Recheck afternoon.  Bilateral upper extremity swelling: - Venous Dopplers.  Anxiety  Depression  P: Supportive care  Continue home Effexor  Lamictal  and risperidone .  Continue ICU monitoring for EVD monitoring.   Labs   CBC: Recent Labs  Lab 03/12/24 0419 03/13/24 0448 03/15/24 0533 03/16/24 0534 03/17/24 0242 03/17/24 1426  03/18/24 0517  WBC 8.3   < > 5.9 5.5 5.0 5.7 5.3  NEUTROABS 7.3  --   --   --   --   --   --   HGB 10.0*   < > 9.3* 9.5* 7.6* 7.0* 7.4*  HCT 28.0*   < > 27.4* 28.6* 23.5* 22.2* 23.3*  MCV 93.3   < > 97.2 96.9 100.0 104.2* 103.1*  PLT 178   < > 200 236 210 208 206   < > = values in this interval not displayed.    Basic Metabolic Panel: Recent Labs  Lab 03/15/24 2214 03/16/24 0534 03/16/24 0759 03/16/24 1132 03/16/24 1513 03/17/24 0242 03/17/24 0826 03/17/24 1426 03/17/24 2030 03/18/24 0255 03/18/24 0517  NA 135  133* 140   < > 138   < > 147* 150* 151* 151* 154* 155*  155*  K 3.8 3.7  --  3.8  --  3.5  --   --   --   --  3.7  CL 102 108  --  108  --  122*  --   --   --   --  127*  CO2 22 18*  --  18*  --  20*  --   --   --   --  21*  GLUCOSE 106* 94  --  93  --  125*  --   --   --   --  130*  BUN 9 9  --  11  --  19  --   --   --   --  17  CREATININE 0.78 0.70  --  0.74  --  0.67  --   --   --   --  0.67  CALCIUM  8.0* 8.0*  --  8.0*  --  7.9*  --   --   --   --  8.1*  MG  --   --   --  1.9  --  2.1  --   --   --   --  2.0  PHOS  --   --   --  3.9  --  2.5  --   --   --   --  2.6   < > = values in this interval not displayed.   GFR: Estimated Creatinine Clearance: 60.8 mL/min (by C-G formula based on SCr of 0.67 mg/dL). Recent Labs  Lab 03/16/24 0534 03/17/24 0242 03/17/24 1426 03/18/24 0517  WBC 5.5 5.0 5.7 5.3    Liver Function Tests: No results for input(s): AST, ALT, ALKPHOS, BILITOT, PROT, ALBUMIN in the last 168 hours. No results for input(s): LIPASE, AMYLASE in the last 168 hours. No results for input(s): AMMONIA in the last 168 hours.  ABG    Component Value Date/Time   PHART 7.333 (L) 03/11/2024 2338   PCO2ART 36.2 03/11/2024 2338   PO2ART 220 (H) 03/11/2024 2338   HCO3 19.6 (L) 03/11/2024 2338   TCO2 21 (L) 03/11/2024 2338   ACIDBASEDEF 6.0 (H) 03/11/2024 2338   O2SAT 100 03/11/2024 2338     Coagulation Profile: No results  for input(s): INR, PROTIME in the last 168 hours.  Cardiac Enzymes: No results for input(s): CKTOTAL, CKMB, CKMBINDEX, TROPONINI in the last 168 hours.  HbA1C: Hgb A1c MFr Bld  Date/Time Value Ref Range Status  10/17/2021 06:21 AM 5.1 4.8 - 5.6 % Final    Comment:    (NOTE) Pre diabetes:          5.7%-6.4%  Diabetes:              >6.4%  Glycemic control for   <7.0% adults with diabetes   09/09/2021 04:44 PM 4.8 4.8 - 5.6 % Final    Comment:    (NOTE) Pre diabetes:          5.7%-6.4%  Diabetes:              >6.4%  Glycemic control for   <7.0% adults with diabetes     CBG: Recent Labs  Lab 03/17/24 1147 03/17/24 1544 03/17/24 2349 03/18/24 0358 03/18/24 0740  GLUCAP 101* 152* 111* 113* 110*    Review of Systems:   See HPI   Past Medical History:  She,  has a past medical history of Allergy, Anxiety, Arrhythmia, Asthma, Bronchitis, Depression, Family history of anesthesia complication, GERD (gastroesophageal reflux disease), Goiter (03/17/2012), Heartburn, Herpes, Hypertension, Seizures (HCC), and Urinary tract infection.   Surgical History:   Past Surgical History:  Procedure Laterality Date   CESAREAN SECTION     x 2   COLONOSCOPY     CRANIOTOMY Right 03/11/2024   Procedure: CRANIOTOMY HEMATOMA  EVACUATION SUBDURAL;  Surgeon: Darnella Dorn SAUNDERS, MD;  [facility] OR;  Service: Neurosurgery;  Laterality: Right;   ELBOW SURGERY     right   KNEE ARTHROSCOPY     right   PILONIDAL CYST EXCISION     spinal cystectomy     where pilonidal cyst was   THYROIDECTOMY  04/07/2012   Procedure: THYROIDECTOMY;  Surgeon: Sherlean JINNY Laughter, MD;  Location: [facility] OR;  Service: General;  Laterality: N/A;        Social History:   reports that she quit smoking about 24 years ago. Her smoking use included cigarettes. She has never used smokeless tobacco. She reports that she does not drink alcohol and does not use drugs.   Family History:  Her family history  includes Atrial fibrillation in her sister; Cervical cancer in her mother; Cirrhosis in her brother; Fibromyalgia in her sister; Heart disease in her father; Heart failure in her father; Hypertension in her brother and father; Irritable bowel syndrome in her brother; Prostate cancer in her brother; Seizures in her brother and father. There is no history of Breast cancer.   Allergies Allergies  Allergen Reactions   Amlodipine  Other (See Comments)    Flushing, tingling in face, sore throat  (Norvasc )   Cyclobenzaprine Hcl Other (See Comments)    Seizures    Aripiprazole Other (See Comments)   Cymbalta  [Duloxetine  Hcl] Other (See Comments)    Made the depression worse   Tape Rash    Blisters     Home Medications  Prior to Admission medications   Medication Sig Start Date End Date Taking? Authorizing Provider  acyclovir  (ZOVIRAX ) 400 MG tablet Take 400 mg by mouth 3 (three) times daily as needed (Flare ups). 01/09/24  Yes [provider]  busPIRone  (BUSPAR ) 7.5 MG tablet Take 7.5 mg by mouth 2 (two) times daily.   Yes [provider]  lamoTRIgine  (LAMICTAL ) 150 MG tablet Take 150 mg by mouth daily.   Yes [provider]  levothyroxine  (SYNTHROID ) 100 MCG tablet Take 1 tablet (100 mcg total) by mouth daily. 10/10/21  Yes Von Bellis, MD  metoprolol  succinate (TOPROL -XL) 25 MG 24 hr tablet Take 25 mg by mouth daily.   Yes [provider]  olmesartan (BENICAR) 20 MG tablet Take 20 mg by mouth daily. 01/07/24  Yes [provider]  risperiDONE  (RISPERDAL ) 3 MG tablet Take 3 mg by mouth at bedtime. 03/06/24  Yes [provider]  traZODone  (DESYREL ) 50 MG tablet Take 50 mg by mouth at bedtime. 03/06/24  Yes [provider]  venlafaxine  XR (EFFEXOR -XR) 150 MG 24 hr capsule Take 300 mg by mouth daily.   Yes [provider]     CRITICAL CARE Performed by: Sammi JONETTA Fredericks.     Total critical care time: 33 minutes   Critical  care time was exclusive of separately billable procedures and treating other patients.   Critical care was necessary to treat or prevent imminent or life-threatening deterioration.   Critical care was time spent personally by me on the following activities: development of treatment plan with patient and/or surrogate as well as nursing, discussions with consultants, evaluation of patient's response to treatment, examination of patient, obtaining history from patient or surrogate, ordering and performing treatments and interventions, ordering and review of laboratory studies, ordering and review of radiographic studies, pulse oximetry, re-evaluation of patient's condition and participation in multidisciplinary rounds.  Sammi JONETTA Fredericks, MD Pulmonary, Critical Care and Sleep Attending.  Pager: [phone]  03/18/2024, 10:47 AM

## 2024-03-18 NOTE — Progress Notes (Signed)
 VASCULAR LAB    Bilateral upper extremity venous duplex has been performed.  See CV proc for preliminary results.   Joclyn Alsobrook, RVT 03/18/2024, 4:13 PM

## 2024-03-18 NOTE — Progress Notes (Signed)
 Patient ID: Colleen [name], female   DOB: 08/21/1950, 73 y.o.   MRN: [id number] Patient is status post craniotomy on 9 /1 for subdural hematoma.  She underwent intraventricular catheter placement on 9 4 secondary to decreased mentation.  No CSF drainage has been obtained however she appears a bit more alert much like she did yesterday.  Following commands answering questions appropriately.  If improvement continues IVC was likely to be removed tomorrow

## 2024-03-19 ENCOUNTER — Inpatient Hospital Stay (HOSPITAL_COMMUNITY)

## 2024-03-19 DIAGNOSIS — E870 Hyperosmolality and hypernatremia: Secondary | ICD-10-CM | POA: Diagnosis not present

## 2024-03-19 DIAGNOSIS — S065XAA Traumatic subdural hemorrhage with loss of consciousness status unknown, initial encounter: Secondary | ICD-10-CM | POA: Diagnosis not present

## 2024-03-19 DIAGNOSIS — E039 Hypothyroidism, unspecified: Secondary | ICD-10-CM

## 2024-03-19 LAB — BASIC METABOLIC PANEL WITH GFR
Anion gap: 11 (ref 5–15)
Anion gap: 7 (ref 5–15)
BUN: 20 mg/dL (ref 8–23)
BUN: 20 mg/dL (ref 8–23)
CO2: 21 mmol/L — ABNORMAL LOW (ref 22–32)
CO2: 24 mmol/L (ref 22–32)
Calcium: 8.1 mg/dL — ABNORMAL LOW (ref 8.9–10.3)
Calcium: 8 mg/dL — ABNORMAL LOW (ref 8.9–10.3)
Chloride: 115 mmol/L — ABNORMAL HIGH (ref 98–111)
Chloride: 109 mmol/L (ref 98–111)
Creatinine, Ser: 0.78 mg/dL (ref 0.44–1.00)
Creatinine, Ser: 0.68 mg/dL (ref 0.44–1.00)
GFR, Estimated: >60 mL/min (ref >60)
GFR, Estimated: >60 mL/min (ref >60)
Glucose, Bld: 121 mg/dL — ABNORMAL HIGH (ref 70–99)
Glucose, Bld: 98 mg/dL (ref 70–99)
Potassium: 3.6 mmol/L (ref 3.5–5.1)
Potassium: 3.7 mmol/L (ref 3.5–5.1)
Sodium: 147 mmol/L — ABNORMAL HIGH (ref 135–145)
Sodium: 140 mmol/L (ref 135–145)

## 2024-03-19 LAB — HEPARIN LEVEL (UNFRACTIONATED)
Heparin Unfractionated: <0.1 [IU]/mL — ABNORMAL LOW (ref 0.30–0.70)
Heparin Unfractionated: 0.1 [IU]/mL — ABNORMAL LOW (ref 0.30–0.70)
Heparin Unfractionated: 0.24 [IU]/mL — ABNORMAL LOW (ref 0.30–0.70)

## 2024-03-19 LAB — GLUCOSE, CAPILLARY
Glucose-Capillary: 109 mg/dL — ABNORMAL HIGH (ref 70–99)
Glucose-Capillary: 127 mg/dL — ABNORMAL HIGH (ref 70–99)
Glucose-Capillary: 123 mg/dL — ABNORMAL HIGH (ref 70–99)
Glucose-Capillary: 124 mg/dL — ABNORMAL HIGH (ref 70–99)
Glucose-Capillary: 119 mg/dL — ABNORMAL HIGH (ref 70–99)

## 2024-03-19 LAB — CBC
HCT: 22.8 % — ABNORMAL LOW (ref 36.0–46.0)
Hemoglobin: 7.4 g/dL — ABNORMAL LOW (ref 12.0–15.0)
MCH: 33.2 pg (ref 26.0–34.0)
MCHC: 32.5 g/dL (ref 30.0–36.0)
MCV: 102.2 fL — ABNORMAL HIGH (ref 80.0–100.0)
Platelets: 208 K/uL (ref 150–400)
RBC: 2.23 MIL/uL — ABNORMAL LOW (ref 3.87–5.11)
RDW: 13.7 % (ref 11.5–15.5)
WBC: 6.2 K/uL (ref 4.0–10.5)
nRBC: 0.6 % — ABNORMAL HIGH (ref 0.0–0.2)

## 2024-03-19 LAB — TSH: TSH: 1.205 u[IU]/mL (ref 0.350–4.500)

## 2024-03-19 LAB — MAGNESIUM: Magnesium: 1.9 mg/dL (ref 1.7–2.4)

## 2024-03-19 LAB — PHOSPHORUS: Phosphorus: 3.1 mg/dL (ref 2.5–4.6)

## 2024-03-19 MED ORDER — POTASSIUM CHLORIDE 20 MEQ PO PACK
40.0000 meq | PACK | Freq: Once | ORAL | Status: AC
  Administered 2024-03-19: 40 meq
  Filled 2024-03-19: qty 2

## 2024-03-19 MED ORDER — FUROSEMIDE 10 MG/ML IJ SOLN
20.0000 mg | Freq: Once | INTRAMUSCULAR | Status: AC
  Administered 2024-03-19: 20 mg via INTRAVENOUS
  Filled 2024-03-19: qty 2

## 2024-03-19 MED ORDER — FENTANYL CITRATE PF 50 MCG/ML IJ SOSY
12.5000 ug | PREFILLED_SYRINGE | INTRAMUSCULAR | Status: DC | PRN
Start: 2024-03-19 — End: 2024-03-20
  Administered 2024-03-19 – 2024-03-20 (×2): 12.5 ug via INTRAVENOUS
  Filled 2024-03-19 (×2): qty 1

## 2024-03-19 NOTE — Progress Notes (Signed)
 Nutrition Follow-up  DOCUMENTATION CODES:   Not applicable  INTERVENTION:   Continue tube feeding via Cortrak tube: Osmolite 1.5 at 40 ml/h (960 ml per day)  Prosource TF20 60 ml BID  Provides 1600 kcal, 100 gm protein, 729 ml free water daily   NUTRITION DIAGNOSIS:   Inadequate oral intake related to lethargy/confusion as evidenced by meal completion < 50%. Ongoing.   GOAL:   Patient will meet greater than or equal to 90% of their needs Met with TF at goal   MONITOR:   TF tolerance, PO intake  REASON FOR ASSESSMENT:    (cortrak)    ASSESSMENT:   Pt with PMH of HTN, asthma, GERD, anxiety, and depression admitted post fall with acute SDH.   Pt discussed during ICU rounds and with RN and MD.  Pt sleeping soundly, she will wake to voice but not as alert as the day I completed her initial assessment. Pt had not ate, meal tray at bedside untouched. Pt had repeat head CT this am due to decrease in mental status but remains stable.  Pt has been on Regular diet off and on but intake never more that 50% when awake and alert to eat.   8/29 - admitted for SDH and cervial spine hematoma 8/31 - s/p R crani for SDH evacuation  9/2 - confused with worsening SDH 9/5 - s/p cortrak placement; tip gastric    Medications reviewed and include: synthroid , desitin, protonix , miralax , 40 mEq KCl x 1, senokot-s  Labs reviewed:  Na 147 Vitamin B 12 448 CBG's 109-141   UOP 1270 ml    Diet Order:   Diet Order             Diet regular Room service appropriate? Yes with Assist; Fluid consistency: Thin  Diet effective now                   EDUCATION NEEDS:   Not appropriate for education at this time  Skin:  Skin Assessment: Reviewed RN Assessment (R crain incision/staples; traumatic wound to R arm from fall)  Last BM:  9/6 medium; type 6  Height:   Ht Readings from Last 1 Encounters:  03/09/24 5' 4.02 (1.626 m)    Weight:   Wt Readings from Last 1  Encounters:  03/19/24 67.5 kg    BMI:  Body mass index is 25.53 kg/m.  Estimated Nutritional Needs:   Kcal:  1500-1700  Protein:  80-100 grams  Fluid:  > 1.5L/day  Keane Martelli P., RD, LDN, CNSC See AMiON for contact information

## 2024-03-19 NOTE — TOC Progression Note (Signed)
 Transition of Care [facility]) - Progression Note    Patient Details  Name: Colleen [name] MRN: [id number] Date of Birth: [birth year]  Transition of Care [facility]) CM/SW Contact  Inocente GORMAN Kindle, LCSW Phone Number: 03/19/2024, 2:10 PM  Clinical Narrative:    CSW continuing to follow. Patient with Cortrak.     Expected Discharge Plan: IP Rehab Facility Barriers to Discharge: Continued Medical Work up               Expected Discharge Plan and Services In-house Referral: Clinical Social Work Discharge Planning Services: CM Consult                                           Social Drivers of Health (SDOH) Interventions SDOH Screenings   Food Insecurity: No Food Insecurity (03/09/2024)  Housing: Low Risk  (03/09/2024)  Transportation Needs: No Transportation Needs (03/09/2024)  Utilities: Not At Risk (03/09/2024)  Alcohol Screen: Low Risk  (10/10/2021)  Depression (PHQ2-9): High Risk (10/09/2021)  Social Connections: Patient Declined (03/09/2024)  Tobacco Use: Medium Risk (03/09/2024)    Readmission Risk Interventions     No data to display

## 2024-03-19 NOTE — Progress Notes (Signed)
 ANTICOAGULATION CONSULT NOTE  Pharmacy Consult for heparin  Indication: DVT  Allergies  Allergen Reactions   Amlodipine  Other (See Comments)    Flushing, tingling in face, sore throat  (Norvasc )   Cyclobenzaprine Hcl Other (See Comments)    Seizures    Aripiprazole Other (See Comments)   Cymbalta  [Duloxetine  Hcl] Other (See Comments)    Made the depression worse   Tape Rash    Blisters    Patient Measurements: Height: 5' 4.02 (162.6 cm) Weight: 67.5 kg (148 lb 13 oz) IBW/kg (Calculated) : 54.74 Heparin  Dosing Weight: 65 kg  Vital Signs: Temp: 98.8 F (37.1 C) (09/08 2000) Temp Source: Axillary (09/08 2000) BP: 157/83 (09/08 2115) Pulse Rate: 76 (09/08 2115)  Labs: Recent Labs    03/18/24 0517 03/18/24 1348 03/19/24 0457 03/19/24 1304 03/19/24 2239  HGB 7.4* 7.3* 7.4*  --   --   HCT 23.3* 23.1* 22.8*  --   --   PLT 206 216 208  --   --   HEPARINUNFRC  --   --  <0.10* 0.10* 0.24*  CREATININE 0.67  --  0.78 0.68  --     Estimated Creatinine Clearance: 60 mL/min (by C-G formula based on SCr of 0.68 mg/dL).  Medical History: Past Medical History:  Diagnosis Date   Allergy    Anxiety    Arrhythmia    does not see cardiologist, sees pcp, Dr. 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  Neurosurgery cleared to start DVT ppx 9/7.  Now with upper extremity DVT, pharmacy consulted for heparin  dosing.  No anticoagulation PTA. No bolus per neuro, plan to target lower end of goal with anemia and ~7d out from craniotomy.    9/7 upper extremity doppler with partially occlusive acute DVT of R subclavian and R axillary veins and age indeterminate superfiical VTE L cephalic and basilic veins.  9/8 AM update:  Heparin  level sub-therapeutic but trending up  Goal of Therapy:  Heparin  level 0.3-0.5 units/ml Monitor platelets by anticoagulation protocol: Yes   Plan:  No boluses Increase heparin  infusion to 1050 units/hr 8 hour anti-Xa level Monitor daily heparin  level, CBC, signs/symptoms of bleeding   Lynwood Mckusick, PharmD, BCPS Clinical Pharmacist Phone: [phone]

## 2024-03-19 NOTE — Progress Notes (Signed)
 ANTICOAGULATION CONSULT NOTE  Pharmacy Consult for heparin  Indication: DVT  Allergies  Allergen Reactions   Amlodipine  Other (See Comments)    Flushing, tingling in face, sore throat  (Norvasc )   Cyclobenzaprine Hcl Other (See Comments)    Seizures    Aripiprazole Other (See Comments)   Cymbalta  [Duloxetine  Hcl] Other (See Comments)    Made the depression worse   Tape Rash    Blisters    Patient Measurements: Height: 5' 4.02 (162.6 cm) Weight: 67.5 kg (148 lb 13 oz) IBW/kg (Calculated) : 54.74 Heparin  Dosing Weight: 65 kg  Vital Signs: Temp: 98 F (36.7 C) (09/08 0700) Temp Source: Oral (09/08 0700) BP: 158/68 (09/08 1300) Pulse Rate: 68 (09/08 1300)  Labs: Recent Labs    03/17/24 0242 03/17/24 1426 03/18/24 0517 03/18/24 1348 03/19/24 0457 03/19/24 1304  HGB 7.6*   < > 7.4* 7.3* 7.4*  --   HCT 23.5*   < > 23.3* 23.1* 22.8*  --   PLT 210   < > 206 216 208  --   HEPARINUNFRC  --   --   --   --  <0.10* 0.10*  CREATININE 0.67  --  0.67  --  0.78  --    < > = values in this interval not displayed.    Estimated Creatinine Clearance: 60 mL/min (by C-G formula based on SCr of 0.78 mg/dL).  Medical History: Past Medical History:  Diagnosis Date   Allergy    Anxiety    Arrhythmia    does not see cardiologist, sees pcp, Dr. 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  Neurosurgery cleared to start DVT ppx 9/7.  Now with upper extremity DVT, pharmacy consulted for heparin  dosing.  No anticoagulation PTA. No bolus per neuro, plan to target lower end of goal with anemia and ~7d out from craniotomy.    9/7 upper extremity doppler with partially occlusive acute DVT of R subclavian and R axillary veins and age indeterminate superfiical VTE L cephalic and basilic veins.  Heparin  level 0.10 is subtherapeutic on 800 units/hr.  Level drawn appropriately, d/w RN  Goal of Therapy:  Heparin  level 0.3-0.5 units/ml Monitor platelets by anticoagulation protocol: Yes   Plan:  No bolus Increase heparin  infusion to 950 units/hr 8 hour anti-Xa level Monitor daily heparin  level, CBC, signs/symptoms of bleeding   Sharyne Glatter, PharmD, BCCCP Critical Care Clinical Pharmacist 03/19/2024 1:53 PM

## 2024-03-19 NOTE — Progress Notes (Signed)
 ANTICOAGULATION CONSULT NOTE  Pharmacy Consult for heparin  Indication: DVT  Allergies  Allergen Reactions   Amlodipine  Other (See Comments)    Flushing, tingling in face, sore throat  (Norvasc )   Cyclobenzaprine Hcl Other (See Comments)    Seizures    Aripiprazole Other (See Comments)   Cymbalta  [Duloxetine  Hcl] Other (See Comments)    Made the depression worse   Tape Rash    Blisters    Patient Measurements: Height: 5' 4.02 (162.6 cm) Weight: 67.5 kg (148 lb 13 oz) IBW/kg (Calculated) : 54.74 Heparin  Dosing Weight: 65 kg  Vital Signs: Temp: 98.3 F (36.8 C) (09/08 0400) Temp Source: Oral (09/08 0400) BP: 143/69 (09/08 0600) Pulse Rate: 70 (09/08 0600)  Labs: Recent Labs    03/17/24 0242 03/17/24 1426 03/18/24 0517 03/18/24 1348 03/19/24 0457  HGB 7.6*   < > 7.4* 7.3* 7.4*  HCT 23.5*   < > 23.3* 23.1* 22.8*  PLT 210   < > 206 216 208  HEPARINUNFRC  --   --   --   --  <0.10*  CREATININE 0.67  --  0.67  --  0.78   < > = values in this interval not displayed.    Estimated Creatinine Clearance: 60 mL/min (by C-G formula based on SCr of 0.78 mg/dL).  Medical History: Past Medical History:  Diagnosis Date   Allergy    Anxiety    Arrhythmia    does not see cardiologist, sees pcp, Dr. 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  Neurosurgery cleared to start DVT ppx 9/7.  Now with upper extremity DVT, pharmacy consulted for heparin  dosing.  No anticoagulation PTA. No bolus per neuro, plan to target lower end of goal with anemia and ~7d out from craniotomy.    9/7 upper extremity doppler with partially occlusive acute DVT of R subclavian and R axillary veins and age indeterminate superfiical VTE L cephalic and basilic veins.  Heparin  level <0.1 is subtherapeutic on 650 units/hr.  No issues with infusion or bleeding per RN.  Goal of Therapy:  Heparin  level 0.3-0.5 units/ml Monitor platelets by anticoagulation protocol: Yes   Plan:  Increase heparin  infusion 800 units/hr 8 hour anti-Xa level Monitor daily heparin  level, CBC, signs/symptoms of bleeding    Jinnie Door, PharmD, BCPS, BCCP Clinical Pharmacist  Please check AMION for all [facility] Pharmacy phone numbers After 10:00 PM, call Main Pharmacy [phone]

## 2024-03-19 NOTE — Progress Notes (Signed)
 Physical Therapy Treatment Patient Details Name: Colleen [name] MRN: [id number] DOB: 12-15-1950 Today's Date: 03/19/2024   History of Present Illness Colleen [name] is a 73 yo female presenting to ED 8/29 after a fall with headache, neck pain, and nausea. CT shows R scalp/periorbital hematoma, acute SDH along the R cerebral convexity measuring 8 mm thickness with mass effect and 8 mm leftward midline shift as well as an acute SDH along the R aspect of the falx measuring up to 5 mm in thickness, and an epidural hematoma at the craniocervical junction. MRI shows small dorsal epidural hematoma between C1 vertebrae and occiput. Worsening neurostatus 8/31, taken for R craniotomy for subdural hematoma evacuation. s/p EVD placement 9/4, removed 9/8. New dx LUE DVT 9/7 started on heparin  9/7, MD okayed mobility as of 9/8. PMH includes HTN, asthma, GERD, anxiety/depression, thyroid  garter s/p total thyroidectomy 2013    PT Comments  Pt in bed upon arrival to room, eyes closed but opens with stimulation and follows one-step commands with increased time. Pt more alert with EOB and OOB activity today with vss, pt's main complaint is room-spinning dizziness and nausea upon standing, not aided by gaze stabilization on focal point in room but improved with seated rest. RN notified, and session limited due to this. Pt requiring min +2 assist for mobility at this time, plan remains appropriate.       If plan is discharge home, recommend the following: A little help with walking and/or transfers;Help with stairs or ramp for entrance;Assist for transportation;Supervision due to cognitive status   Can travel by private vehicle        Equipment Recommendations  Other (comment) (tbd)    Recommendations for Other Services       Precautions / Restrictions Precautions Precautions: Fall Precaution/Restrictions Comments: SBP < 140, CVP Restrictions Weight Bearing Restrictions Per Provider Order: No      Mobility  Bed Mobility Overal bed mobility: Needs Assistance Bed Mobility: Supine to Sit     Supine to sit: Min assist, HOB elevated, Used rails     General bed mobility comments: assist for sequencing, completion of LE translation off EOB, and trunk elevation.    Transfers Overall transfer level: Needs assistance Equipment used: Rolling walker (2 wheels) Transfers: Sit to/from Stand Sit to Stand: Min assist   Step pivot transfers: +2 safety/equipment, Min assist       General transfer comment: assist for power up, rise, steadying. Pt endorsing room-spinning dizziness and nausea upon standing, not aided by gaze stabilization on focal point in room but improved with seated rest.    Ambulation/Gait               General Gait Details: unable - dizziness and nausea near emesis   Stairs             Wheelchair Mobility     Tilt Bed    Modified Rankin (Stroke Patients Only)       Balance Overall balance assessment: Needs assistance Sitting-balance support: Feet supported Sitting balance-Leahy Scale: Fair     Standing balance support: Bilateral upper extremity supported, During functional activity Standing balance-Leahy Scale: Poor                              Communication Communication Communication: Impaired Factors Affecting Communication: Difficulty expressing self  Cognition Arousal: Alert (drowsy but following all commands with increased time) Behavior During Therapy: Flat affect   PT -  Cognitive impairments: Sequencing, Awareness, Initiation, Problem solving, Safety/Judgement, Attention                       PT - Cognition Comments: follows one-step commands consistently, more eye opening with EOB sittign and standing vs supine. Following commands: Impaired Following commands impaired: Follows one step commands with increased time    Cueing Cueing Techniques: Verbal cues  Exercises      General Comments         Pertinent Vitals/Pain Pain Assessment Pain Assessment: No/denies pain Pain Intervention(s): Monitored during session    Home Living                          Prior Function            PT Goals (current goals can now be found in the care plan section) Acute Rehab PT Goals Patient Stated Goal: did not state PT Goal Formulation: With patient Time For Goal Achievement: 03/28/24 Potential to Achieve Goals: Good Progress towards PT goals: Progressing toward goals    Frequency    Min 3X/week      PT Plan      Co-evaluation              AM-PAC PT 6 Clicks Mobility   Outcome Measure  Help needed turning from your back to your side while in a flat bed without using bedrails?: A Little Help needed moving from lying on your back to sitting on the side of a flat bed without using bedrails?: A Little Help needed moving to and from a bed to a chair (including a wheelchair)?: A Little Help needed standing up from a chair using your arms (e.g., wheelchair or bedside chair)?: A Little Help needed to walk in hospital room?: A Lot Help needed climbing 3-5 steps with a railing? : Total 6 Click Score: 15    End of Session Equipment Utilized During Treatment: Gait belt Activity Tolerance: Patient limited by lethargy Patient left: with nursing/sitter in room;in chair;with chair alarm set;with call bell/phone within reach;with family/visitor present Nurse Communication: Mobility status PT Visit Diagnosis: Unsteadiness on feet (R26.81);Difficulty in walking, not elsewhere classified (R26.2)     Time: 8844-8781 PT Time Calculation (min) (ACUTE ONLY): 23 min  Charges:    $Therapeutic Activity: 8-22 mins PT General Charges $$ ACUTE PT VISIT: 1 Visit                     Colleen [name], PT DPT Acute Rehabilitation Services Secure Chat Preferred  Office [phone]    Colleen [name] 03/19/2024, 2:49 PM

## 2024-03-19 NOTE — Progress Notes (Signed)
 NAME:  Colleen [name], MRN:  [id number], DOB:  March 03, 1951, LOS: 7 ADMISSION DATE:  03/09/2024, CONSULTATION DATE:  03/12/24  REFERRING MD:  MD Malcolm CHIEF COMPLAINT:  Subdural Hematoma   History of Present Illness:  Pt is a 73 yr old female with significant pmhx of HTN, Asthma, GERD, lethargy/catanoia anxiety, and depression who presented to [facility] post fall with complaints of headache, neck pain, and nausea on 03/09/24. CT scans of maxillofacial, head, C-Spine were obtained revealed, right scalp/periorbital hematoma, acute subdural hemorrhage along the right cerebral convexity, measuring up to 8 mm in thickness with mass effect and with 8 mm leftward midline shift, acute subdural hemorrhage also present along the right aspect of the falx, measuring up to 5 mm in thickness, and a Epidural hematoma at the craniocervical junction and within the cervical spinal canal, 4mm. Patient transferred to [facility] for further neuro evaluation and MRI of cervical spine.  MRI showing small dorsal epidural hematoma located between CI vertebrae and occiput.  On 8/31, patient having worsening neurostatus becoming more lethargic. Patient was given 6mg  of ativan  for history catatonia/lethargic with no improvement subsequently patient taken for a craniotomy for subdural hematoma evacuation.   Pertinent  Medical History   Past Medical History:  Diagnosis Date   Allergy    Anxiety    Arrhythmia    does not see cardiologist, sees pcp, Dr. 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  CT head showed worsening SDH.  Repeat CT head with stable SDH.  Discussed with neurosurgery, expected postop changes.  No surgical interventions.  EEG negative. 9/3: all drains are out.  9/4: Worsening mentation.  Stat CT head showed slight worsening SDH [9 mm] with persistent midline shift to left, increased cerebral edema with crowding of basilar cisterns increased effacement of fourth ventricle and partial effacement of prepontine cistern and inferior descent of cerebral tonsils.  Status post EVD placement.  Only transdusing.  Interim History / Subjective:   Somnolent, but opens eyes to verbal stimuli Given of fentanyl  at 6am  Objective    Blood pressure 136/74, pulse 66, temperature 98 F (36.7 C), temperature source Oral, resp. rate 17, height 5' 4.02 (1.626 m), weight 67.5 kg, SpO2 97%.        Intake/Output Summary (Last 24 hours) at 03/19/2024 0737 Last data filed at 03/19/2024 0700 Gross per 24 hour  Intake 1639.72 ml  Output 1270 ml  Net 369.72 ml   Filed Weights   03/17/24 0500 03/18/24 0406 03/19/24 0500  Weight: 63.1 kg 69.4 kg 67.5 kg  Examination: General: Elderly female, laying in bed, sleeping, no distress  HENT: Normocephalic, moist mucous membrane.  Staples from craniotomy on right.  EVD in place Lungs: Clear to auscultation bilaterally. Cardiovascular: Heart rate regular in rate and rhythm.  No murmurs appreciated. Abdomen: bs, soft  Neuro: Very lethargic. Following commands.  Moving all 4 extremities.  Sodium 155 to 147   Resolved problem list   Assessment and Plan  Right Craniotomy for SDH  Evacuation - 8/31  Hx of Lethargy/Catatonia , diagnosed as functional catatonia EVD in place  Multiple events in the hospital suggestive of delirium versus functional catatonia 8/29 CT of maxillofacial, head, and C-spine- acute subdural hemorrhage along the right cerebral convexity, measuring up to 8 mm in thickness with mass effect and with 8 mm leftward midline shift, acute subdural hemorrhage also present along the right aspect of the falx, measuring up to 5 mm in thickness, and a Epidural hematoma at the craniocervical junction and within the cervical spinal canal, 4mm. MRI showing small dorsal epidural hematoma located between CI vertebrae and occiput. Repeat CT head 9/2 with slight worsening of SDH from 4 mm to 7 mm.  Repeat CT head 9/3 stable SDH. CT head 9/4: SDH of 9 mm slight worsening from 8 mm.  Cerebral edema. Status post EVD on 9/4.  - EVD per neurosurgery - Both JP drain and subdural drain have been removed. - SBP goal < 140  - Sodium goal 145-155 per neurosurgery.  Previously on hypertonic saline through yesterday afternoon. Na 147 this morning down from 155 yesterday - Unclear if lethargy this morning is due to narcotics vs Na changes. Other consideration is bleeding given therpeutic heparin  added yesterday for DVT - Check CT Head -  Continue Coreg , ARB and hydralazine .   - Continue Keppra  BID  -Psych meds as below for catatonia.  Hypernatremia - goal sodium 145-155 - Continue to monitor, 147 this AM - give lasix  20mg  IV this AM  HTN  - SBP goal < 140 as above - See above.  GERD - P.o. Protonix .  Anemia: -Persistently low hemoglobin.  No bleeding.  Hemoglobin stable.  Likely because of multiple blood draws. - monitor - B12, iron studies and folate all WNL  DVT Right Subclavian Vein and Right Axillary Vein Bilateral upper extremity swelling - Venous Dopplers 9/7 - started heparin  drip 9/7  Anxiety  Depression  - Supportive care  - Continue home Effexor   Lamictal  and risperidone .  Hypothyroidism - continue synthroid  - check TSH  Continue ICU monitoring for EVD monitoring.   Labs   CBC: Recent Labs  Lab 03/17/24 0242 03/17/24 1426 03/18/24 0517 03/18/24 1348 03/19/24 0457  WBC 5.0 5.7 5.3 6.6 6.2  HGB 7.6* 7.0* 7.4* 7.3* 7.4*  HCT 23.5* 22.2* 23.3* 23.1* 22.8*  MCV 100.0 104.2* 103.1* 104.1* 102.2*  PLT 210 208 206 216 208    Basic Metabolic Panel: Recent Labs  Lab 03/16/24 0534 03/16/24 0759 03/16/24 1132 03/16/24 1513 03/17/24 0242 03/17/24 0826 03/18/24 0255 03/18/24 0517 03/18/24 1348 03/18/24 2037 03/19/24 0457  NA 140   < > 138   < > 147*   < > 154* 155*  155* 157* 150* 147*  K 3.7  --  3.8  --  3.5  --   --  3.7  --   --  3.6  CL 108  --  108  --  122*  --   --  127*  --   --  115*  CO2 18*  --  18*  --  20*  --   --  21*  --   --  21*  GLUCOSE 94  --  93  --  125*  --   --  130*  --   --  121*  BUN 9  --  11  --  19  --   --  17  --   --  20  CREATININE 0.70  --  0.74  --  0.67  --   --  0.67  --   --  0.78  CALCIUM  8.0*  --  8.0*  --  7.9*  --   --  8.1*  --   --  8.1*  MG  --   --  1.9  --  2.1  --   --  2.0  --   --  1.9  PHOS  --   --  3.9  --  2.5  --   --  2.6  --   --  3.1   < > = values in this interval not displayed.   GFR: Estimated Creatinine Clearance: 60 mL/min (by C-G formula based on SCr of 0.78 mg/dL). Recent Labs  Lab 03/17/24 1426 03/18/24 0517 03/18/24 1348 03/19/24 0457  WBC 5.7 5.3 6.6 6.2    Liver Function Tests: No results for input(s): AST, ALT, ALKPHOS, BILITOT, PROT, ALBUMIN in the last 168 hours. No results for input(s): LIPASE, AMYLASE in the last 168 hours. No results for input(s): AMMONIA in the last 168 hours.  ABG    Component Value Date/Time   PHART 7.333 (L) 03/11/2024 2338   PCO2ART 36.2 03/11/2024 2338   PO2ART 220 (H) 03/11/2024 2338   HCO3 19.6 (L) 03/11/2024 2338   TCO2 21 (L) 03/11/2024 2338   ACIDBASEDEF 6.0 (H) 03/11/2024  2338   O2SAT 100 03/11/2024 2338     Coagulation Profile: No results for input(s): INR, PROTIME in the last 168 hours.  Cardiac Enzymes: No results for input(s): CKTOTAL, CKMB, CKMBINDEX, TROPONINI in the last 168 hours.  HbA1C: Hgb A1c MFr Bld  Date/Time Value Ref Range Status  10/17/2021 06:21 AM 5.1 4.8 - 5.6 % Final    Comment:    (NOTE) Pre diabetes:          5.7%-6.4%  Diabetes:              >6.4%  Glycemic control for   <7.0% adults with diabetes   09/09/2021 04:44 PM 4.8 4.8 - 5.6 % Final    Comment:    (NOTE) Pre diabetes:          5.7%-6.4%  Diabetes:              >6.4%  Glycemic control for   <7.0% adults with diabetes     CBG: Recent Labs  Lab 03/18/24 1506 03/18/24 1915 03/18/24 2310 03/19/24 0314 03/19/24 0733  GLUCAP 97 141* 104* 109* 127*    CRITICAL CARE Performed by: Dorn Chill     Total critical care time: 35 minutes   Critical care time was exclusive of separately billable procedures and treating other patients.   Critical care was necessary to treat or prevent imminent or life-threatening deterioration.   Critical care was time spent personally by me on the following activities: development of treatment plan with patient and/or surrogate as well as nursing, discussions with consultants, evaluation of patient's response to treatment, examination of patient, obtaining history from patient or surrogate, ordering and performing treatments and interventions, ordering  and review of laboratory studies, ordering and review of radiographic studies, pulse oximetry, re-evaluation of patient's condition and participation in multidisciplinary rounds.  Dorn Chill, MD [facility] Office: [phone]   See Amion for personal pager PCCM on call pager [phone] until 7pm. Please call Elink 7p-7a. [phone]

## 2024-03-19 NOTE — Progress Notes (Signed)
 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 Stable on repeat imaging yet patient was exhibiting lethargy, so the pt underwent R craniotomy for SDH evacuation on 8/31. Was doing well postop, but on POD4 had another neurologic decline with slight increase in global cerebral edema. Hypertonic saline started EVD placed for ICP monitoring which was stable for multiple days with waxing/waning mental status. EEG has been checked which was normal. Pt developed a RUE DVT and heparin  gtt was started on 9/7.  LOS: 7 days    Plan: ICP's have been normal since placement. EVD removed Can return to goal normonatremia SBP<140 AAT DAT No collar or intervention for CCJ findings RUE DVT - on heparin  gtt per CCS. Remain in ICU until therapeutic. Will obtain CT head once therapeutic Rehab consult placed  Subjective: Pt more lethargic this morning prompting a CT head which was stable  Objective: Vital signs in last 24 hours: Temp:  [97.8 F (36.6 C)-98.3 F (36.8 C)] 98 F (36.7 C) (09/08 0700) Pulse Rate:  [66-93] 68 (09/08 0800) Resp:  [14-38] 19 (09/08 0800) BP: (119-175)/(61-88) 133/75 (09/08 0800) SpO2:  [95 %-100 %] 96 % (09/08 0800) Weight:  [67.5 kg] 67.5 kg (09/08 0500)  Intake/Output from previous day: 09/07 0701 - 09/08 0700 In: 1639.7 [P.O.:360; I.V.:279.7; NG/GT:1000] Out: 1270 [Urine:1270] Intake/Output this shift: No intake/output data recorded.  Exam: GCS 3E 3V 20M Oriented to name. Has a few one word answers Conjugate gaze, PERRL Grimace symmetric Commands BUE and BLE full strength Cranial incision c/d/I, staples EVD incision c/d/I, sutures.  Lab Results: Recent Labs    03/18/24 1348 03/19/24 0457  WBC 6.6 6.2  HGB 7.3* 7.4*  HCT 23.1* 22.8*  PLT 216 208   BMET Recent Labs    03/18/24 0517 03/18/24 1348 03/18/24 2037 03/19/24 0457  NA 155*  155*   < >  150* 147*  K 3.7  --   --  3.6  CL 127*  --   --  115*  CO2 21*  --   --  21*  GLUCOSE 130*  --   --  121*  BUN 17  --   --  20  CREATININE 0.67  --   --  0.78  CALCIUM  8.1*  --   --  8.1*   < > = values in this interval not displayed.       Colleen [name] 03/19/2024, 8:26 AM

## 2024-03-20 ENCOUNTER — Inpatient Hospital Stay (HOSPITAL_COMMUNITY)

## 2024-03-20 DIAGNOSIS — D649 Anemia, unspecified: Secondary | ICD-10-CM | POA: Diagnosis not present

## 2024-03-20 DIAGNOSIS — E876 Hypokalemia: Secondary | ICD-10-CM | POA: Diagnosis not present

## 2024-03-20 DIAGNOSIS — S065XAA Traumatic subdural hemorrhage with loss of consciousness status unknown, initial encounter: Secondary | ICD-10-CM | POA: Diagnosis not present

## 2024-03-20 DIAGNOSIS — I10 Essential (primary) hypertension: Secondary | ICD-10-CM | POA: Diagnosis not present

## 2024-03-20 LAB — BASIC METABOLIC PANEL WITH GFR
Anion gap: 11 (ref 5–15)
BUN: 23 mg/dL (ref 8–23)
CO2: 22 mmol/L (ref 22–32)
Calcium: 7.9 mg/dL — ABNORMAL LOW (ref 8.9–10.3)
Chloride: 104 mmol/L (ref 98–111)
Creatinine, Ser: 0.65 mg/dL (ref 0.44–1.00)
GFR, Estimated: >60 mL/min (ref >60)
Glucose, Bld: 133 mg/dL — ABNORMAL HIGH (ref 70–99)
Potassium: 3.4 mmol/L — ABNORMAL LOW (ref 3.5–5.1)
Sodium: 137 mmol/L (ref 135–145)

## 2024-03-20 LAB — GLUCOSE, CAPILLARY
Glucose-Capillary: 136 mg/dL — ABNORMAL HIGH (ref 70–99)
Glucose-Capillary: 132 mg/dL — ABNORMAL HIGH (ref 70–99)
Glucose-Capillary: 122 mg/dL — ABNORMAL HIGH (ref 70–99)
Glucose-Capillary: 111 mg/dL — ABNORMAL HIGH (ref 70–99)
Glucose-Capillary: 137 mg/dL — ABNORMAL HIGH (ref 70–99)
Glucose-Capillary: 109 mg/dL — ABNORMAL HIGH (ref 70–99)

## 2024-03-20 LAB — CBC
HCT: 23 % — ABNORMAL LOW (ref 36.0–46.0)
Hemoglobin: 7.6 g/dL — ABNORMAL LOW (ref 12.0–15.0)
MCH: 33 pg (ref 26.0–34.0)
MCHC: 33 g/dL (ref 30.0–36.0)
MCV: 100 fL (ref 80.0–100.0)
Platelets: 234 K/uL (ref 150–400)
RBC: 2.3 MIL/uL — ABNORMAL LOW (ref 3.87–5.11)
RDW: 13.2 % (ref 11.5–15.5)
WBC: 7.5 K/uL (ref 4.0–10.5)
nRBC: 0.4 % — ABNORMAL HIGH (ref 0.0–0.2)

## 2024-03-20 MED ORDER — IPRATROPIUM-ALBUTEROL 0.5-2.5 (3) MG/3ML IN SOLN
3.0000 mL | Freq: Four times a day (QID) | RESPIRATORY_TRACT | Status: DC | PRN
  Administered 2024-03-21: 3 mL via RESPIRATORY_TRACT
  Filled 2024-03-20: qty 3

## 2024-03-20 MED ORDER — POTASSIUM CHLORIDE 20 MEQ PO PACK
40.0000 meq | PACK | Freq: Once | ORAL | Status: AC
  Administered 2024-03-20: 40 meq
  Filled 2024-03-20: qty 2

## 2024-03-20 MED ORDER — FENTANYL CITRATE PF 50 MCG/ML IJ SOSY
12.5000 ug | PREFILLED_SYRINGE | Freq: Four times a day (QID) | INTRAMUSCULAR | Status: DC | PRN
Start: 2024-03-20 — End: 2024-03-26
  Administered 2024-03-24: 12.5 ug via INTRAVENOUS
  Filled 2024-03-20 (×2): qty 1

## 2024-03-20 MED ORDER — SENNOSIDES-DOCUSATE SODIUM 8.6-50 MG PO TABS: 1.0000 | ORAL_TABLET | Freq: Two times a day (BID) | ORAL | Status: DC

## 2024-03-20 MED ORDER — ENOXAPARIN SODIUM 100 MG/ML IJ SOSY
90.0000 mg | PREFILLED_SYRINGE | INTRAMUSCULAR | Status: DC
  Administered 2024-03-20 – 2024-03-22 (×3): 90 mg via SUBCUTANEOUS
  Filled 2024-03-20 (×4): qty 0.9

## 2024-03-20 MED ORDER — POLYETHYLENE GLYCOL 3350 17 G PO PACK: 17.0000 g | PACK | Freq: Two times a day (BID) | ORAL | Status: DC

## 2024-03-20 NOTE — Progress Notes (Addendum)
 Occupational Therapy Treatment Patient Details Name: Colleen [name] MRN: [id number] DOB: 13-Oct-1950 Today's Date: 03/20/2024   History of present illness Colleen [name] is a 73 yo female presenting to ED 8/29 after a fall with headache, neck pain, and nausea. CT shows R scalp/periorbital hematoma, acute SDH along the R cerebral convexity measuring 8 mm thickness with mass effect and 8 mm leftward midline shift as well as an acute SDH along the R aspect of the falx measuring up to 5 mm in thickness, and an epidural hematoma at the craniocervical junction. MRI shows small dorsal epidural hematoma between C1 vertebrae and occiput. Worsening neurostatus 8/31, taken for R craniotomy for subdural hematoma evacuation. s/p EVD placement 9/4, removed 9/8. New dx LUE DVT 9/7 started on heparin  9/7, MD okayed mobility as of 9/8. PMH includes HTN, asthma, GERD, anxiety/depression, thyroid  garter s/p total thyroidectomy 2013   OT comments  Very lethargic with seated attempts at grooming and self feeding.  Patient would reach for objects, but difficulty with grasp and bringing items to her mouth.  Lethargy impacted session, not safe with PO trial due to choking risk.  OT will continue efforts in the acute setting to address deficits.  Patient will benefit from continued inpatient follow up therapy, <3 hours/day.      If plan is discharge home, recommend the following:  A lot of help with bathing/dressing/bathroom;A lot of help with walking and/or transfers;Two people to help with walking and/or transfers;Assistance with cooking/housework;Assist for transportation;Help with stairs or ramp for entrance;Direct supervision/assist for financial management;Direct supervision/assist for medications management   Equipment Recommendations  None recommended by OT    Recommendations for Other Services      Precautions / Restrictions Precautions Precautions: Fall Precaution/Restrictions Comments: SBP < 140,  CVP Restrictions Weight Bearing Restrictions Per Provider Order: No              ADL either performed or assessed with clinical judgement   ADL   Eating/Feeding: Moderate assistance;Sitting   Grooming: Wash/dry hands;Wash/dry face;Moderate assistance;Sitting                                      Extremity/Trunk Assessment Upper Extremity Assessment Upper Extremity Assessment: Generalized weakness LUE Deficits / Details: Difficulty holding onto RW L handle, weak grip LUE Coordination: decreased fine motor;decreased gross motor   Lower Extremity Assessment Lower Extremity Assessment: Defer to PT evaluation   Cervical / Trunk Assessment Cervical / Trunk Assessment: Normal    Vision   Depth Perception: Undershoots;Overshoots   Perception Perception Perception: Not tested   Praxis Praxis Praxis: Not tested   Communication Communication Communication: Impaired Factors Affecting Communication: Difficulty expressing self   Cognition Arousal: Obtunded Behavior During Therapy: Flat affect Cognition: Difficult to assess Difficult to assess due to: Level of arousal                             Following commands: Impaired Following commands impaired: Follows one step commands with increased time, Follows one step commands inconsistently      Cueing   Cueing Techniques: Verbal cues, Gestural cues, Tactile cues                     Pertinent Vitals/ Pain       Pain Assessment Facial Expression: Relaxed, neutral Body Movements: Absence of movements Muscle Tension: Relaxed  Compliance with ventilator (intubated pts.): N/A Vocalization (extubated pts.): N/A CPOT Total: 0 Pain Intervention(s): Monitored during session                                                          Frequency           Progress Toward Goals  OT Goals(current goals can now be found in the care plan section)  Progress towards  OT goals: Not progressing toward goals - comment  Acute Rehab OT Goals OT Goal Formulation: With patient Time For Goal Achievement: 03/28/24 Potential to Achieve Goals: Fair  Plan      Co-evaluation                 AM-PAC OT 6 Clicks Daily Activity     Outcome Measure   Help from another person eating meals?: A Lot Help from another person taking care of personal grooming?: A Lot Help from another person toileting, which includes using toliet, bedpan, or urinal?: Total Help from another person bathing (including washing, rinsing, drying)?: A Lot Help from another person to put on and taking off regular upper body clothing?: A Lot Help from another person to put on and taking off regular lower body clothing?: A Lot 6 Click Score: 11    End of Session    OT Visit Diagnosis: Unsteadiness on feet (R26.81);Muscle weakness (generalized) (M62.81);Other symptoms and signs involving cognitive function;Pain   Activity Tolerance Patient limited by lethargy   Patient Left in chair;with call bell/phone within reach;with chair alarm set   Nurse Communication Other (comment)        Time: 8970-8960 OT Time Calculation (min): 10 min  Charges: OT General Charges $OT Visit: 1 Visit OT Treatments $Self Care/Home Management : 8-22 mins  03/20/2024  RP, OTR/L  Acute Rehabilitation Services  Office:  [phone]   [name] 03/20/2024, 10:42 AM

## 2024-03-20 NOTE — Plan of Care (Signed)
   Problem: Activity: Goal: Risk for activity intolerance will decrease Outcome: Not Progressing

## 2024-03-20 NOTE — Progress Notes (Signed)
 Chaplain responded to AD spiritual consult. Pt Colleen [name] was unresponsive, however I spoke with daughter Colleen [name] by phone. Colleen [name] is listed in our system as the primary HCPOA. She expressed her wish to have a proxy account so as to be able to view Colleen [name]'s medical records in order to make informed medical decisions on her behalf. She explained that on 9/3 she made the request via [email or web address], which is the proper procedure, but has yet to hear back. In order to assist Colleen [name] in this, I called the Health Information Management Department and left them a voicemail to call her back, along with her email address ([email or web address]).  Chaplains remain available as further needs arise.

## 2024-03-20 NOTE — Progress Notes (Signed)
   Inpatient Rehabilitation Admissions Coordinator   I contacted both daughters, Rollo and [name] by phone. I discussed CIR goals and expectations. Neither daughter can provide the 24/7 supervision that she will need after a CIR admit. [name] asks to be the designated person for dispo planning and she is requesting SNF. Acute team and TOC made aware. We will sign off.  Heron Leavell, RN, MSN Rehab Admissions Coordinator [phone] 03/20/2024 11:42 AM

## 2024-03-20 NOTE — Plan of Care (Signed)
  Problem: Education: Goal: Knowledge of General Education information will improve Description: Including pain rating scale, medication(s)/side effects and non-pharmacologic comfort measures Outcome: Progressing   Problem: Health Behavior/Discharge Planning: Goal: Ability to manage health-related needs will improve Outcome: Not Progressing   Problem: Clinical Measurements: Goal: Ability to maintain clinical measurements within normal limits will improve Outcome: Progressing Goal: Will remain free from infection Outcome: Progressing Goal: Diagnostic test results will improve Outcome: Progressing Goal: Respiratory complications will improve Outcome: Progressing Goal: Cardiovascular complication will be avoided Outcome: Progressing   Problem: Activity: Goal: Risk for activity intolerance will decrease Outcome: Progressing   Problem: Nutrition: Goal: Adequate nutrition will be maintained Outcome: Progressing   Problem: Coping: Goal: Level of anxiety will decrease Outcome: Progressing   Problem: Elimination: Goal: Will not experience complications related to bowel motility Outcome: Progressing Goal: Will not experience complications related to urinary retention Outcome: Not Progressing   Problem: Pain Managment: Goal: General experience of comfort will improve and/or be controlled Outcome: Progressing   Problem: Safety: Goal: Ability to remain free from injury will improve Outcome: Not Progressing   Problem: Skin Integrity: Goal: Risk for impaired skin integrity will decrease Outcome: Progressing   Problem: Education: Goal: Knowledge of the prescribed therapeutic regimen will improve Outcome: Progressing   Problem: Clinical Measurements: Goal: Usual level of consciousness will be regained or maintained. Outcome: Progressing Goal: Neurologic status will improve Outcome: Progressing Goal: Ability to maintain intracranial pressure will improve Outcome:  Progressing   Problem: Skin Integrity: Goal: Demonstration of wound healing without infection will improve Outcome: Not Progressing

## 2024-03-20 NOTE — Progress Notes (Signed)
 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 Stable on repeat imaging yet patient was exhibiting lethargy, so the pt underwent R craniotomy for SDH evacuation on 8/31. Was doing well postop, but on POD4 had another neurologic decline with slight increase in global cerebral edema. Hypertonic saline started EVD placed for ICP monitoring which was stable for multiple days with waxing/waning mental status. EEG has been checked which was normal. Pt developed a RUE DVT and heparin  gtt was started on 9/7.  LOS: 8 days    Plan: Lethargy episodes: has undergone craniotomy with stable postop scans. Has undergone EVD for 3 days of ICP monitoring with all normal ICP's despite waxing/waning exam. Will plan to monitor neurologically moving forward knowing that she has FND/catatonia episodes. Will be difficult to examine at times. Pupillary changes will be most reliable Goal normonatremia SBP<140 Activity as tolerated Diet as tolerated No collar or intervention for CCJ findings RUE DVT - switching to therapeutic lovenox  per [facility]. Plan to obtain a head CT a few hours after lovenox  is administered Rehab consult placed Anticipate transfer to 4NP if CT head stable after lovenox   Subjective: EVD was removed yesterday bc of consistently low ICP's despite neuro changes. Na returned to normal  Objective: Vital signs in last 24 hours: Temp:  [98 F (36.7 C)-99.7 F (37.6 C)] 98.4 F (36.9 C) (09/09 0400) Pulse Rate:  [60-92] 68 (09/09 0700) Resp:  [13-22] 17 (09/09 0700) BP: (114-158)/(56-83) 126/69 (09/09 0700) SpO2:  [94 %-100 %] 94 % (09/09 0700) Weight:  [62.1 kg] 62.1 kg (09/09 0500)  Intake/Output from previous day: 09/08 0701 - 09/09 0700 In: 1092.4 [I.V.:212.4; NG/GT:880] Out: 2595 [Urine:2595] Intake/Output this shift: No intake/output data recorded.  Exam: GCS 3E 3V 64M Oriented to  name. Has a few one word answers Conjugate gaze, PERRL Grimace symmetric Commands BUE and BLE full strength Cranial incision c/d/I, staples EVD incision c/d/I, sutures.  Lab Results: Recent Labs    03/19/24 0457 03/20/24 0528  WBC 6.2 7.5  HGB 7.4* 7.6*  HCT 22.8* 23.0*  PLT 208 234   BMET Recent Labs    03/19/24 1304 03/20/24 0528  NA 140 137  K 3.7 3.4*  CL 109 104  CO2 24 22  GLUCOSE 98 133*  BUN 20 23  CREATININE 0.68 0.65  CALCIUM  8.0* 7.9*       Dorn SAUNDERS Deretha Ertle 03/20/2024, 7:26 AM

## 2024-03-20 NOTE — Progress Notes (Signed)
 Pharmacy Electrolyte Replacement  Recent Labs:  Recent Labs    03/19/24 0457 03/19/24 1304 03/20/24 0528  K 3.6   < > 3.4*  MG 1.9  --   --   PHOS 3.1  --   --   CREATININE 0.78   < > 0.65   < > = values in this interval not displayed.    Low Critical Values (K </= 2.5, Phos </= 1, Mg </= 1) Present: None  Plan: Give KCL 40 mEq per tube.   Noriel Guthrie, PharmD

## 2024-03-20 NOTE — Progress Notes (Signed)
 0800 patient lethargic needs lots of stimuli to follow commands and answer questions  1000 foley removed and patient up to chair with therapy patient more alert able to make needs known. 1300 patient back in bed for ultrasound PIV and PICC removal, tolerated both well. Tube feeding held while laying flat after PICC line removal 1500 patient has no urge to urinate bladder scanned 126 ml noted. Patient taken to CT scan  1530 returned from CT scan

## 2024-03-20 NOTE — Progress Notes (Addendum)
 NAME:  Colleen [name], MRN:  [id number], DOB:  1951-04-29, LOS: 8 ADMISSION DATE:  03/09/2024, CONSULTATION DATE:  03/12/24  REFERRING MD:  MD Malcolm CHIEF COMPLAINT:  Subdural Hematoma   History of Present Illness:  Pt is a 73 yr old female with significant pmhx of HTN, Asthma, GERD, lethargy/catanoia anxiety, and depression who presented to [facility] post fall with complaints of headache, neck pain, and nausea on 03/09/24. CT scans of maxillofacial, head, C-Spine were obtained revealed, right scalp/periorbital hematoma, acute subdural hemorrhage along the right cerebral convexity, measuring up to 8 mm in thickness with mass effect and with 8 mm leftward midline shift, acute subdural hemorrhage also present along the right aspect of the falx, measuring up to 5 mm in thickness, and a Epidural hematoma at the craniocervical junction and within the cervical spinal canal, 4mm. Patient transferred to [facility] for further neuro evaluation and MRI of cervical spine.  MRI showing small dorsal epidural hematoma located between CI vertebrae and occiput.  On 8/31, patient having worsening neurostatus becoming more lethargic. Patient was given 6mg  of ativan  for history catatonia/lethargic with no improvement subsequently patient taken for a craniotomy for subdural hematoma evacuation.   Pertinent  Medical History   Past Medical History:  Diagnosis Date   Allergy    Anxiety    Arrhythmia    does not see cardiologist, sees pcp, Dr. 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  CT head showed worsening SDH.  Repeat CT head with stable SDH.  Discussed with neurosurgery, expected postop changes.  No surgical interventions.  EEG negative. 9/3: all drains are out.  9/4: Worsening mentation.  Stat CT head showed slight worsening SDH [9 mm] with persistent midline shift to left, increased cerebral edema with crowding of basilar cisterns increased effacement of fourth ventricle and partial effacement of prepontine cistern and inferior descent of cerebral tonsils.  Status post EVD placement.  Only transdusing. 9/7 RUE DVT; hep gtt no bolus  9/8 repeat CT H unchanged  9/9 dc foley, try to get PIV to dc PICC   Interim History / Subjective:  Labs VS notes imaging reviewed   CT H yesterday unchanged  NAEO   Objective    Blood pressure 131/74, pulse 70, temperature 98.4 F (36.9 C), temperature source Axillary, resp. rate 15, height 5' 4.02 (1.626 m), weight 62.1 kg, SpO2 97%.        Intake/Output Summary (Last 24 hours) at 03/20/2024 0922 Last data filed at 03/20/2024 0900 Gross per 24 hour  Intake 1047.79 ml  Output 2595 ml  Net -  1547.21 ml   Filed Weights   03/18/24 0406 03/19/24 0500 03/20/24 0500  Weight: 69.4 kg 67.5 kg 62.1 kg    Examination: General: chronically ill elderly F NAD  Neuro: Awakens easily but very lethargic. Oriented x4. Generalized weakness.  HENT: Staples are intact, surgical site well approximated  Lungs: CTAb on room air  Cardiovascular: rrr  Abdomen: soft ndnt  GU:  foley     Resolved problem list  Hypernatremia   Assessment and Plan   Traumatic SDH 2/2 ground level fall with  brain compression S/p R crani, evac (8/31)  S/p EVD (9/4) + removal (9/8)  Hx functional catatonia  Depression, anxiety  P -post op per NSGY  -SBP < 140  -BID keppra  -delirium precautions -cont effexor , lamictal , risperidone   -PT/OT   HTN -goal < 140 -coreg , ARB, hydal   R subclavian and R axillary DVT  -has RUE PICC, plausible that this is catalyst  P -hep gtt no bolus, has been subtherapeutic. Think we should change to lovenox  acutely, then PO agent before dc. Will reach out to pharmD  -will need 3-21mo tx, 77mo probably fine since likely PICC related   Anemia, stable  -quite possible AoC. Suspect iatrogenic losses a large contributor w 10d hospital stay  P -AM CBC  -if hgb remains stable in coming days on hep gtt, hopefully can space out CBC checks and decr add'l iatrogenic losses   Hypokalemia -replace  -AM BMP, hopefully will be able to space out in the coming days   Hypothyroidism -synthroid    Inadequate PO intake -encourage PO intake. Would keep cortrak however as I  doubt her PO intake will meet acute needs  -will d/w RDN   L/T/D -has cortrak, continue  -dc foley 9/9 -PICC was placed 9/5 for hypertonic, which she is not on any longer.  Will dc when able to get PIV access   Dispo: -Likely nearing ability to txf out of ICU (from my perspective, appropriate to transfer, need to d/w NSGY who is primary) -would recommend asking TR[facility] to follow for medical management when she does leave the unit   Labs   CBC: Recent Labs  Lab 03/17/24 1426 03/18/24 0517 03/18/24 1348 03/19/24 0457 03/20/24 0528  WBC 5.7 5.3 6.6 6.2 7.5  HGB 7.0* 7.4* 7.3* 7.4* 7.6*  HCT 22.2* 23.3* 23.1* 22.8* 23.0*  MCV 104.2* 103.1* 104.1* 102.2* 100.0  PLT 208 206 216 208 234    Basic Metabolic Panel: Recent Labs  Lab 03/16/24 1132 03/16/24 1513 03/17/24 0242 03/17/24 0826 03/18/24 0517 03/18/24 1348 03/18/24 2037 03/19/24 0457 03/19/24 1304 03/20/24 0528  NA 138   < >  147*   < > 155*  155* 157* 150* 147* 140 137  K 3.8  --  3.5  --  3.7  --   --  3.6 3.7 3.4*  CL 108  --  122*  --  127*  --   --  115* 109 104  CO2 18*  --  20*  --  21*  --   --  21* 24 22  GLUCOSE 93  --  125*  --  130*  --   --  121* 98 133*  BUN 11  --  19  --  17  --   --  20 20 23   CREATININE 0.74  --  0.67  --  0.67  --   --  0.78 0.68 0.65  CALCIUM  8.0*  --  7.9*  --  8.1*  --   --  8.1* 8.0* 7.9*  MG 1.9  --  2.1  --  2.0  --   --  1.9  --   --   PHOS 3.9  --  2.5  --  2.6  --   --  3.1  --   --    < > = values in this interval not displayed.   GFR: Estimated Creatinine Clearance: 54.9 mL/min (by C-G formula based on SCr of 0.65 mg/dL). Recent Labs  Lab 03/18/24 0517 03/18/24 1348 03/19/24 0457 03/20/24 0528  WBC 5.3 6.6 6.2 7.5    Liver Function Tests: No results for input(s): AST, ALT, ALKPHOS, BILITOT, PROT, ALBUMIN in the last 168 hours. No results for input(s): LIPASE, AMYLASE in the last 168 hours. No results for input(s): AMMONIA in the last 168 hours.  ABG    Component Value Date/Time   PHART 7.333 (L) 03/11/2024 2338   PCO2ART 36.2 03/11/2024 2338   PO2ART 220 (H) 03/11/2024 2338   HCO3 19.6 (L) 03/11/2024 2338   TCO2 21 (L) 03/11/2024 2338   ACIDBASEDEF 6.0 (H) 03/11/2024 2338   O2SAT 100 03/11/2024 2338     Coagulation Profile: No results for input(s): INR, PROTIME in the last 168 hours.  Cardiac Enzymes: No results for input(s): CKTOTAL, CKMB, CKMBINDEX, TROPONINI in the last 168 hours.  HbA1C: Hgb A1c MFr Bld  Date/Time Value Ref Range Status  10/17/2021 06:21 AM 5.1 4.8 - 5.6 % Final    Comment:    (NOTE) Pre diabetes:          5.7%-6.4%  Diabetes:              >6.4%  Glycemic control for   <7.0% adults with diabetes   09/09/2021 04:44 PM 4.8 4.8 - 5.6 % Final    Comment:    (NOTE) Pre diabetes:          5.7%-6.4%  Diabetes:              >6.4%  Glycemic control for   <7.0% adults with diabetes      CBG: Recent Labs  Lab 03/19/24 1155 03/19/24 1609 03/19/24 2324 03/20/24 0341 03/20/24 0756  GLUCAP 123* 124* 119* 136* 132*     High MDM   Ronnald Gave MSN, AGACNP-BC [place] [facility] Amion for pager  03/20/2024, 9:22 AM

## 2024-03-20 NOTE — TOC Progression Note (Signed)
 Transition of Care [facility]) - Progression Note    Patient Details  Name: Colleen [name] MRN: [id number] Date of Birth: 1950-08-16  Transition of Care [facility]) CM/SW Contact  Colleen GORMAN Kindle, LCSW Phone Number: 03/20/2024, 12:21 PM  Clinical Narrative:    CSW received consult for SNF placement at time of discharge per family's request. CSW spoke with patient's daughter, Colleen [name]. She that patient's family is currently unable to care for patient at home given patient's current physical needs and fall risk. Colleen [name] expressed understanding of PT recommendation and is agreeable to SNF placement at time of discharge. Colleen [name] stated her sister reports preference for [facility] but she will have her sister call CSW with further preferences. Colleen [name] is primary contact but is leaving the country from 9/23-10/9 so her sister can be contacted during that time. CSW discussed insurance authorization process and will provide Medicare SNF ratings list. CSW will send out referrals for review once patient's Cortrak is removed.    Skilled Nursing Rehab Facilities-   [email or web address]   Ratings out of 5 stars (5 the highest)  Name Address  Phone # Quality Care Staffing Health Inspection Overall  [facility] [address] [phone] 2 1 2 1   [facility] [address] [place] [phone] 5 2 4 5   [facility] [address], [facility] [phone] 2 1 1 1   [facility] [address], [place] [phone] 4 3 4 4   [facility]  [address], [place] [phone] 4 3 5 5   [facility] [address], [facility] [phone] 5 3 2 3   [facility] [address] [place] [phone] 5 1 2 2   [facility] [phone] [address] [place] [phone] 3 4 4 4   [address] ([place]) [address] [place] [phone] 1 3 3 2   [facility] [address] [place], [place] [phone] 4 2 2 2    [facility] ([place]) [address], [place] [phone] 2 1 1 1   Colleen [name] [address] Colleen [name] [phone] 2 4 4 4   [facility] [address] [place] [phone] 3 1 3 2   [facility] ([facility]) [address], [place] [phone] 1 2 4 3           [facility] [address], [place] [phone] 3 1 5 4   [facility] [address] [place] [phone] 4 2 1 1   [facility]  [address] [place] [phone] 2 4 1 1   [facility] [address] [phone] 2 2 5 5   [facility] [address], [place] [phone] 2 2 3 3           [facility] [address] [place] [phone] 2 1 2 1   [facility] (No [facility]) [address], [place] [phone] 4 3 4 4   [facility] [address], [facility] [phone] 3  5 5   [facility] [address], [place] [phone] 4 2 1 1   [place] [address] [place] [phone] 3 1 2 1   [facility] [address] [place] [phone] 1 3 3 2   [facility] [address], [place] [phone] 2 2 3 3   [facility] [address] [place], [place] [phone] 2 1 4 3   [facility] [address], [employer] [phone] 2 1 1 1   [facility] [address] [place], [place] [phone] 2 1  2 1  [facility] [address] [place] [phone] 3 1 2 1           [facility] [address] [place] [phone] 4 1 3 2   [facility] [address], Colleen [name]  [phone] 2 4 4 4   [facility] (No [employer]) [address], [place] [phone] 3 2 5 5   [facility]) [address], [place] [phone] 3 2 3 3   Colleen [name] [address] [place] [phone] 5 3 5 5   [facility] [address], [place] [phone] 2 1 1 1   [facility] [address]  [place], [place] [phone] 3 5 5 5           [facility] [address] [place], [place] [phone] 5 4 5 5   [facility])  [address], [place] [phone] 1 1 2 1   [facility]) [address] [place], [place] [phone]  2 4 4   [facility] [address], [place] [phone] 3 5 5 5   [address] Colleen [name], [place] [phone] 4 2 2 2   [facility]) [address] [place] [phone] 1 1 3 1   [facility] [address], [place] [phone] 2 2 2 2       Expected Discharge Plan: Skilled Nursing Facility Barriers to Discharge: Continued Medical Work up, SNF Pending bed offer               Expected Discharge Plan and Services In-house Referral: Clinical Social Work Discharge Planning Services: CM Consult Post Acute Care Choice: Skilled Nursing Facility Living arrangements for the past 2 months: Single Family Home                                       Social Drivers of Health (SDOH) Interventions SDOH Screenings   Food Insecurity: No Food Insecurity (03/09/2024)  Housing: Low Risk  (03/09/2024)  Transportation Needs: No Transportation Needs (03/09/2024)  Utilities: Not At Risk (03/09/2024)  Alcohol Screen: Low Risk  (10/10/2021)  Depression (PHQ2-9): High Risk (10/09/2021)  Social Connections: Patient Declined (03/09/2024)  Tobacco Use: Medium Risk (03/09/2024)    Readmission Risk Interventions     No data to display

## 2024-03-20 NOTE — Progress Notes (Addendum)
 eLink Physician-Brief Progress Note Patient Name: Colleen [name] DOB: 1950-10-30 MRN: [id number]   Date of Service  03/20/2024  HPI/Events of Note  Notified of patient complaining of shortness of breath but sats and RR look fine. She appears comfortable. Has been complaining of headache unrelieved by fentanyl  12.5 Serum CO2 22  eICU Interventions  Ordered Duoneb prn in this patient with background of allergic asthma CXR ordered Discussed with [facility]     Intervention Category Intermediate Interventions: Respiratory distress - evaluation and management  Damien ONEIDA Grout 03/20/2024, 6:39 AM

## 2024-03-20 NOTE — Progress Notes (Signed)
 Physical Therapy Treatment Patient Details Name: Colleen [name] MRN: [id number] DOB: 1951/02/28 Today's Date: 03/20/2024   History of Present Illness Colleen [name] is a 73 yo female presenting to ED 8/29 after a fall with headache, neck pain, and nausea. CT shows R scalp/periorbital hematoma, acute SDH along the R cerebral convexity measuring 8 mm thickness with mass effect and 8 mm leftward midline shift as well as an acute SDH along the R aspect of the falx measuring up to 5 mm in thickness, and an epidural hematoma at the craniocervical junction. MRI shows small dorsal epidural hematoma between C1 vertebrae and occiput. Worsening neurostatus 8/31, taken for R craniotomy for subdural hematoma evacuation. s/p EVD placement 9/4, removed 9/8. New dx LUE DVT 9/7 started on heparin  9/7, MD okayed mobility as of 9/8. PMH includes HTN, asthma, GERD, anxiety/depression, thyroid  garter s/p total thyroidectomy 2013    PT Comments  Pt very lethargic upon arrival to room and difficult to arouse, improved with sitting EOB. Pt tolerating short-distance gait in room with RW, requires max cuing and mod steadying assist for mobility given weakness and dizziness with mobility. Pt's glasses donned during session, which pt states helps somewhat but still dizzy and nauseous. PT to continue to follow.      If plan is discharge home, recommend the following: Help with stairs or ramp for entrance;Assist for transportation;Supervision due to cognitive status;A lot of help with walking and/or transfers;A lot of help with bathing/dressing/bathroom   Can travel by private vehicle        Equipment Recommendations  Other (comment) (tbd)    Recommendations for Other Services       Precautions / Restrictions Precautions Precautions: Fall Precaution/Restrictions Comments: SBP < 140, CVP Restrictions Weight Bearing Restrictions Per Provider Order: No     Mobility  Bed Mobility Overal bed mobility: Needs  Assistance Bed Mobility: Supine to Sit     Supine to sit: Min assist, HOB elevated     General bed mobility comments: assist for completion of LE translation to EOB and trunk elevation off of bed. Pt able to scoot self to EOB without PT assist    Transfers Overall transfer level: Needs assistance Equipment used: Rolling walker (2 wheels) Transfers: Sit to/from Stand Sit to Stand: Min assist           General transfer comment: assist for rise and steady, cues for hand placement when rising and sitting.    Ambulation/Gait Ambulation/Gait assistance: Mod assist Gait Distance (Feet): 15 Feet Assistive device: Rolling walker (2 wheels) Gait Pattern/deviations: Step-through pattern, Decreased stride length, Trunk flexed, Drifts right/left Gait velocity: decr     General Gait Details: assist to steady, guide RW, max cues for placement in RW and maintaining straight trajectory. Pt endorsing dizziness with mobility, pt's prescription glasses donned which pt states helps but still present.   Stairs             Wheelchair Mobility     Tilt Bed    Modified Rankin (Stroke Patients Only)       Balance Overall balance assessment: Needs assistance Sitting-balance support: Feet supported Sitting balance-Leahy Scale: Fair     Standing balance support: Bilateral upper extremity supported, During functional activity Standing balance-Leahy Scale: Poor                              Communication Communication Communication: Impaired Factors Affecting Communication: Difficulty expressing self  Cognition Arousal:  Obtunded Behavior During Therapy: Flat affect                           PT - Cognition Comments: following one-step commands especially when giving multimodal cuing, moreso when sitting EOB or during standing activity for improved arousal purposes Following commands: Impaired Following commands impaired: Follows one step commands with  increased time, Follows one step commands inconsistently    Cueing Cueing Techniques: Verbal cues, Gestural cues, Tactile cues  Exercises      General Comments General comments (skin integrity, edema, etc.): SBP 130s during session, other VSS. Pt with serosanguinous leaking from R dorsum of hand after RW use, suspect small skin split from distal edema. RN saw, PT placed mepilex at request of RN.      Pertinent Vitals/Pain Pain Assessment Pain Assessment: Faces Faces Pain Scale: Hurts a little bit Pain Location: generalized Pain Descriptors / Indicators: Discomfort Pain Intervention(s): Monitored during session, Limited activity within patient's tolerance, Repositioned    Home Living                          Prior Function            PT Goals (current goals can now be found in the care plan section) Acute Rehab PT Goals Patient Stated Goal: did not state PT Goal Formulation: With patient Time For Goal Achievement: 03/28/24 Potential to Achieve Goals: Good Progress towards PT goals: Progressing toward goals    Frequency    Min 3X/week      PT Plan      Co-evaluation              AM-PAC PT 6 Clicks Mobility   Outcome Measure  Help needed turning from your back to your side while in a flat bed without using bedrails?: A Little Help needed moving from lying on your back to sitting on the side of a flat bed without using bedrails?: A Little Help needed moving to and from a bed to a chair (including a wheelchair)?: A Lot Help needed standing up from a chair using your arms (e.g., wheelchair or bedside chair)?: A Lot Help needed to walk in hospital room?: A Lot Help needed climbing 3-5 steps with a railing? : Total 6 Click Score: 13    End of Session Equipment Utilized During Treatment: Gait belt Activity Tolerance: Patient limited by lethargy Patient left: in chair;with chair alarm set;with call bell/phone within reach Nurse Communication:  Mobility status PT Visit Diagnosis: Unsteadiness on feet (R26.81);Difficulty in walking, not elsewhere classified (R26.2)     Time: 9056-8997 PT Time Calculation (min) (ACUTE ONLY): 19 min  Charges:    $Therapeutic Activity: 8-22 mins PT General Charges $$ ACUTE PT VISIT: 1 Visit                     Johana RAMAN, PT DPT Acute Rehabilitation Services Secure Chat Preferred  Office [phone]    Reylene Stauder E Stroup 03/20/2024, 11:23 AM

## 2024-03-21 ENCOUNTER — Inpatient Hospital Stay (HOSPITAL_COMMUNITY)

## 2024-03-21 DIAGNOSIS — S065XAA Traumatic subdural hemorrhage with loss of consciousness status unknown, initial encounter: Secondary | ICD-10-CM | POA: Diagnosis not present

## 2024-03-21 LAB — CBC
HCT: 22.3 % — ABNORMAL LOW (ref 36.0–46.0)
Hemoglobin: 7.4 g/dL — ABNORMAL LOW (ref 12.0–15.0)
MCH: 32.3 pg (ref 26.0–34.0)
MCHC: 33.2 g/dL (ref 30.0–36.0)
MCV: 97.4 fL (ref 80.0–100.0)
Platelets: 237 K/uL (ref 150–400)
RBC: 2.29 MIL/uL — ABNORMAL LOW (ref 3.87–5.11)
RDW: 13 % (ref 11.5–15.5)
WBC: 6.4 K/uL (ref 4.0–10.5)
nRBC: 0 % (ref 0.0–0.2)

## 2024-03-21 LAB — BASIC METABOLIC PANEL WITH GFR
Anion gap: 9 (ref 5–15)
BUN: 22 mg/dL (ref 8–23)
CO2: 25 mmol/L (ref 22–32)
Calcium: 8 mg/dL — ABNORMAL LOW (ref 8.9–10.3)
Chloride: 102 mmol/L (ref 98–111)
Creatinine, Ser: 0.59 mg/dL (ref 0.44–1.00)
GFR, Estimated: >60 mL/min (ref >60)
Glucose, Bld: 104 mg/dL — ABNORMAL HIGH (ref 70–99)
Potassium: 3.9 mmol/L (ref 3.5–5.1)
Sodium: 136 mmol/L (ref 135–145)

## 2024-03-21 LAB — GLUCOSE, CAPILLARY
Glucose-Capillary: 122 mg/dL — ABNORMAL HIGH (ref 70–99)
Glucose-Capillary: 124 mg/dL — ABNORMAL HIGH (ref 70–99)
Glucose-Capillary: 116 mg/dL — ABNORMAL HIGH (ref 70–99)
Glucose-Capillary: 122 mg/dL — ABNORMAL HIGH (ref 70–99)
Glucose-Capillary: 117 mg/dL — ABNORMAL HIGH (ref 70–99)
Glucose-Capillary: 116 mg/dL — ABNORMAL HIGH (ref 70–99)

## 2024-03-21 MED ORDER — SODIUM CHLORIDE 0.9 % IV SOLN
3.0000 g | Freq: Four times a day (QID) | INTRAVENOUS | Status: AC
  Administered 2024-03-21 – 2024-03-26 (×20): 3 g via INTRAVENOUS
  Filled 2024-03-21 (×20): qty 8

## 2024-03-21 MED ORDER — POLYETHYLENE GLYCOL 3350 17 G PO PACK
17.0000 g | PACK | Freq: Two times a day (BID) | ORAL | Status: DC
  Administered 2024-03-22 – 2024-03-25 (×4): 17 g
  Filled 2024-03-21 (×5): qty 1

## 2024-03-21 MED ORDER — ONDANSETRON HCL 4 MG/2ML IJ SOLN
4.0000 mg | INTRAMUSCULAR | Status: DC | PRN
  Administered 2024-03-24: 4 mg via INTRAVENOUS
  Filled 2024-03-21: qty 2

## 2024-03-21 MED ORDER — VENLAFAXINE HCL 75 MG PO TABS
150.0000 mg | ORAL_TABLET | Freq: Two times a day (BID) | ORAL | Status: DC
  Administered 2024-03-21 – 2024-03-26 (×11): 150 mg
  Filled 2024-03-21 (×12): qty 2

## 2024-03-21 MED ORDER — ONDANSETRON HCL 4 MG PO TABS: 4.0000 mg | ORAL_TABLET | ORAL | Status: DC | PRN

## 2024-03-21 MED ORDER — FUROSEMIDE 10 MG/ML IJ SOLN
40.0000 mg | Freq: Once | INTRAMUSCULAR | Status: AC
  Administered 2024-03-21: 40 mg via INTRAVENOUS
  Filled 2024-03-21: qty 4

## 2024-03-21 MED ORDER — IRBESARTAN 300 MG PO TABS
300.0000 mg | ORAL_TABLET | Freq: Every day | ORAL | Status: DC
  Administered 2024-03-22 – 2024-03-27 (×6): 300 mg
  Filled 2024-03-21 (×2): qty 1
  Filled 2024-03-21: qty 2
  Filled 2024-03-21 (×3): qty 1

## 2024-03-21 MED ORDER — LEVOTHYROXINE SODIUM 100 MCG PO TABS: 100.0000 ug | ORAL_TABLET | Freq: Every day | ORAL | Status: DC

## 2024-03-21 MED ORDER — POLYETHYLENE GLYCOL 3350 17 G PO PACK
17.0000 g | PACK | Freq: Two times a day (BID) | ORAL | Status: DC
  Administered 2024-03-21: 17 g via ORAL
  Filled 2024-03-21: qty 1

## 2024-03-21 MED ORDER — IRBESARTAN 150 MG PO TABS
300.0000 mg | ORAL_TABLET | Freq: Every day | ORAL | Status: DC
  Administered 2024-03-21: 300 mg via ORAL
  Filled 2024-03-21: qty 2

## 2024-03-21 MED ORDER — LAMOTRIGINE 25 MG PO TABS
150.0000 mg | ORAL_TABLET | Freq: Every day | ORAL | Status: DC
  Administered 2024-03-22 – 2024-03-27 (×6): 150 mg
  Filled 2024-03-21 (×6): qty 2

## 2024-03-21 MED ORDER — LEVOTHYROXINE SODIUM 100 MCG PO TABS
100.0000 ug | ORAL_TABLET | Freq: Every day | ORAL | Status: DC
  Administered 2024-03-22 – 2024-03-27 (×6): 100 ug
  Filled 2024-03-21 (×6): qty 1

## 2024-03-21 MED ORDER — LAMOTRIGINE 25 MG PO TABS
150.0000 mg | ORAL_TABLET | Freq: Every day | ORAL | Status: DC
  Administered 2024-03-21: 150 mg via ORAL
  Filled 2024-03-21: qty 2

## 2024-03-21 MED ORDER — VENLAFAXINE HCL 75 MG PO TABS
150.0000 mg | ORAL_TABLET | Freq: Two times a day (BID) | ORAL | Status: DC
  Filled 2024-03-21: qty 2

## 2024-03-21 MED ORDER — CARVEDILOL 12.5 MG PO TABS
12.5000 mg | ORAL_TABLET | Freq: Two times a day (BID) | ORAL | Status: DC
  Administered 2024-03-21 – 2024-03-27 (×12): 12.5 mg
  Filled 2024-03-21 (×12): qty 1

## 2024-03-21 MED ORDER — BUSPIRONE HCL 15 MG PO TABS
7.5000 mg | ORAL_TABLET | Freq: Two times a day (BID) | ORAL | Status: DC
  Administered 2024-03-21 – 2024-03-27 (×12): 7.5 mg
  Filled 2024-03-21 (×13): qty 1

## 2024-03-21 MED ORDER — HYDRALAZINE HCL 50 MG PO TABS
100.0000 mg | ORAL_TABLET | Freq: Three times a day (TID) | ORAL | Status: DC
  Administered 2024-03-21 – 2024-03-27 (×16): 100 mg
  Filled 2024-03-21 (×17): qty 2

## 2024-03-21 MED ORDER — SENNOSIDES-DOCUSATE SODIUM 8.6-50 MG PO TABS
1.0000 | ORAL_TABLET | Freq: Two times a day (BID) | ORAL | Status: DC
  Administered 2024-03-21 – 2024-03-25 (×6): 1 via ORAL
  Filled 2024-03-21 (×8): qty 1

## 2024-03-21 MED ORDER — TRAZODONE HCL 50 MG PO TABS: 50.0000 mg | ORAL_TABLET | Freq: Every day | ORAL | Status: DC

## 2024-03-21 MED ORDER — RISPERIDONE 3 MG PO TABS
3.0000 mg | ORAL_TABLET | Freq: Every day | ORAL | Status: DC
  Filled 2024-03-21: qty 1

## 2024-03-21 MED ORDER — HYDRALAZINE HCL 50 MG PO TABS
100.0000 mg | ORAL_TABLET | Freq: Three times a day (TID) | ORAL | Status: DC
  Administered 2024-03-21: 100 mg via ORAL
  Filled 2024-03-21: qty 2

## 2024-03-21 MED ORDER — BUSPIRONE HCL 15 MG PO TABS
7.5000 mg | ORAL_TABLET | Freq: Two times a day (BID) | ORAL | Status: DC
  Administered 2024-03-21: 7.5 mg via ORAL
  Filled 2024-03-21: qty 1

## 2024-03-21 MED ORDER — TRAZODONE HCL 50 MG PO TABS
50.0000 mg | ORAL_TABLET | Freq: Every day | ORAL | Status: DC
  Administered 2024-03-21 – 2024-03-26 (×6): 50 mg
  Filled 2024-03-21 (×6): qty 1

## 2024-03-21 MED ORDER — CARVEDILOL 12.5 MG PO TABS
12.5000 mg | ORAL_TABLET | Freq: Two times a day (BID) | ORAL | Status: DC
  Filled 2024-03-21: qty 1

## 2024-03-21 MED ORDER — ACETAMINOPHEN 325 MG PO TABS
650.0000 mg | ORAL_TABLET | ORAL | Status: DC | PRN
Start: 2024-03-21 — End: 2024-03-21

## 2024-03-21 MED ORDER — RISPERIDONE 0.5 MG PO TABS
3.0000 mg | ORAL_TABLET | Freq: Every day | ORAL | Status: DC
  Administered 2024-03-21 – 2024-03-26 (×6): 3 mg
  Filled 2024-03-21 (×3): qty 6
  Filled 2024-03-21: qty 1
  Filled 2024-03-21 (×2): qty 6

## 2024-03-21 MED ORDER — ACETAMINOPHEN 160 MG/5ML PO SOLN
650.0000 mg | ORAL | Status: DC | PRN
  Administered 2024-03-24: 650 mg
  Filled 2024-03-21: qty 20.3

## 2024-03-21 NOTE — Progress Notes (Signed)
 Modified Barium Swallow Study  Patient Details  Name: Gladis Soley MRN: [id number] Date of Birth: 1951-04-18  Today's Date: 03/21/2024  Modified Barium Swallow completed.  Full report located under Chart Review in the Imaging Section.  History of Present Illness DALAINA TATES is a 73 yo female presenting to ED 8/29 after a fall with headache, neck pain, and nausea. CT shows R scalp/periorbital hematoma, acute SDH along the R cerebral convexity measuring 8 mm thickness with mass effect and 8 mm leftward midline shift as well as an acute SDH along the R aspect of the falx measuring up to 5 mm in thickness, and an epidural hematoma at the craniocervical junction. MRI shows small dorsal epidural hematoma between C1 vertebrae and occiput. Worsening neurostatus 8/31, taken for R craniotomy for subdural hematoma evacuation. PMH includes HTN, asthma, GERD, anxiety/depression, thyroid  garter s/p total thyroidectomy 2013   Clinical Impression Pt exhibits moderate oropharyngeal dysphagia suspected to be further exacerbated by pt's mentation. Her oral phase is characterized by slow oral transit and incomplete labial seal, leading to gross anterior loss. Laryngeal elevation is complete but anterior hyoid movement and epiglottic deflection are significantly reduced, prohibiting laryngeal vestibule closure. There is consistent penetration during the swallow with all liquids that progresses to the vocal folds as the volume increases when mixed with residuals from the base of tongue and valleculae (PAS 5). She is sensate, initiating throat clearance or multiple swallows to clear the laryngeal vestibule but cannot clear the pharyngeal residue so frank penetration is cyclical. Trace aspiration occurs with thin liquids and pt initiates a cough response but it is ineffective (PAS 7). Her presentation overall appears different from previous sessions with increased effort noted clinically during earlier visit and CXR  showing increased R>L opacities. Recommend NPO except ice chips in moderation and medications given via Cortrak. SLP will f/u to target use of strategies and assess readiness to resume PO diet.  Factors that may increase risk of adverse event in presence of aspiration [employer]ian[employer] 2021): Poor general health and/or compromised immunity;Reduced cognitive function;Limited mobility;Frail or deconditioned;Weak cough;Presence of tubes (ETT, trach, NG, etc.)  Swallow Evaluation Recommendations Recommendations: NPO;Ice chips PRN after oral care Medication Administration: Via alternative means Oral care recommendations: Oral care QID (4x/day);Oral care before ice chips/water    Damien Blumenthal, M.A., CCC-SLP Speech Language Pathology, Acute Rehabilitation Services  Secure Chat preferred [phone]  03/21/2024,4:17 PM

## 2024-03-21 NOTE — Progress Notes (Signed)
 Physical Therapy Treatment Patient Details Name: Colleen [name] MRN: [id number] DOB: August 21, 1950 Today's Date: 03/21/2024   History of Present Illness Colleen [name] is a 73 yo female presenting to ED 8/29 after a fall with headache, neck pain, and nausea. CT shows R scalp/periorbital hematoma, acute SDH along the R cerebral convexity measuring 8 mm thickness with mass effect and 8 mm leftward midline shift as well as an acute SDH along the R aspect of the falx measuring up to 5 mm in thickness, and an epidural hematoma at the craniocervical junction. MRI shows small dorsal epidural hematoma between C1 vertebrae and occiput. Worsening neurostatus 8/31, taken for R craniotomy for subdural hematoma evacuation. s/p EVD placement 9/4, removed 9/8. New dx LUE DVT 9/7 started on heparin  9/7, MD okayed mobility as of 9/8. PMH includes HTN, asthma, GERD, anxiety/depression, thyroid  garter s/p total thyroidectomy 2013    PT Comments  Pt alert and agreeable to participate in physical therapy session. Pt requiring min-modA (+2 safety) for functional mobility. Able to ambulate limited hallway distance today with RW and close chair follow. Pt with episode of bowel incontinence in hallway and assisted back into room for clean up with RN. Will continue to progress as tolerated.    If plan is discharge home, recommend the following: Help with stairs or ramp for entrance;Assist for transportation;Supervision due to cognitive status;A lot of help with walking and/or transfers;A lot of help with bathing/dressing/bathroom   Can travel by private vehicle        Equipment Recommendations  Rollator (4 wheels);BSC/3in1    Recommendations for Other Services       Precautions / Restrictions Precautions Precautions: Fall Recall of Precautions/Restrictions: Impaired Precaution/Restrictions Comments: SBP < 140 Restrictions Weight Bearing Restrictions Per Provider Order: No     Mobility  Bed Mobility Overal bed  mobility: Needs Assistance Bed Mobility: Supine to Sit     Supine to sit: Contact guard     General bed mobility comments: Step by step cueing    Transfers Overall transfer level: Needs assistance Equipment used: Rolling walker (2 wheels) Transfers: Sit to/from Stand, Bed to chair/wheelchair/BSC Sit to Stand: Min assist Stand pivot transfers: Min assist         General transfer comment: Assist to rise and steady from edge of bed    Ambulation/Gait Ambulation/Gait assistance: Mod assist, +2 safety/equipment Gait Distance (Feet): 75 Feet Assistive device: Rolling walker (2 wheels) Gait Pattern/deviations: Step-through pattern, Decreased stride length, Trunk flexed, Drifts right/left       General Gait Details: Pt with right drift, requires consistent cueing for slowing down pace and for safety (poor carryover), tends to have high cadence and increased anterior weight shift. Close chair follow and episode of bowel incontinence in hallway   Stairs             Wheelchair Mobility     Tilt Bed    Modified Rankin (Stroke Patients Only)       Balance Overall balance assessment: Needs assistance Sitting-balance support: Feet supported Sitting balance-Leahy Scale: Fair     Standing balance support: Bilateral upper extremity supported Standing balance-Leahy Scale: Poor                              Communication Communication Communication: Impaired Factors Affecting Communication: Difficulty expressing self  Cognition Arousal: Alert Behavior During Therapy: Flat affect   PT - Cognitive impairments: Sequencing, Awareness, Initiation, Problem solving, Safety/Judgement, Attention  PT - Cognition Comments: Pt oriented to day of week, impulsive with decreased awareness of safety Following commands: Impaired Following commands impaired: Follows one step commands inconsistently    Cueing Cueing Techniques: Verbal  cues, Gestural cues, Tactile cues  Exercises      General Comments        Pertinent Vitals/Pain Pain Assessment Pain Assessment: Faces Faces Pain Scale: No hurt    Home Living                          Prior Function            PT Goals (current goals can now be found in the care plan section) Acute Rehab PT Goals Patient Stated Goal: did not state Potential to Achieve Goals: Good Progress towards PT goals: Progressing toward goals    Frequency    Min 3X/week      PT Plan      Co-evaluation              AM-PAC PT 6 Clicks Mobility   Outcome Measure  Help needed turning from your back to your side while in a flat bed without using bedrails?: A Little Help needed moving from lying on your back to sitting on the side of a flat bed without using bedrails?: A Little Help needed moving to and from a bed to a chair (including a wheelchair)?: A Little Help needed standing up from a chair using your arms (e.g., wheelchair or bedside chair)?: A Little Help needed to walk in hospital room?: A Lot Help needed climbing 3-5 steps with a railing? : Total 6 Click Score: 15    End of Session Equipment Utilized During Treatment: Gait belt Activity Tolerance: Patient tolerated treatment well Patient left: in chair;with call bell/phone within reach;with chair alarm set Nurse Communication: Mobility status PT Visit Diagnosis: Unsteadiness on feet (R26.81);Difficulty in walking, not elsewhere classified (R26.2)     Time: 8968-8894 PT Time Calculation (min) (ACUTE ONLY): 34 min  Charges:    $Therapeutic Activity: 23-37 mins PT General Charges $$ ACUTE PT VISIT: 1 Visit                     Aleck Daring, PT, DPT Acute Rehabilitation Services Office [phone]    [name] 03/21/2024, 12:48 PM

## 2024-03-21 NOTE — Progress Notes (Signed)
 PROGRESS NOTE  Colleen [name] FMW:[id number] DOB: [birth year] DOA: 03/09/2024 PCP: Leonel Cole, MD   LOS: 9 days   Brief Narrative / Interim history: 73 year old female with history of HTN, asthma, anxiety, history of catatonia, depression comes into the hospital with headache, neck pain, nausea.  She apparently had a fall at home, in the ER was found to have acute subdural hemorrhage along the right cerebral convexity with mass effect and midline shift.  She was admitted to the neuro ICU status post right craniotomy 8/31 for evacuation and EVD placement on 9/4 with removal on 9/8.  Hospital course complicated by right subclavian and axillary DVT in the setting of a PICC line, now placed on anticoagulation  Subjective / 24h Interval events: She is complaining of a cough as well as increasing shortness of breath, mainly when laying flat but also upright  Assesement and Plan: Principal problem Traumatic SDH due to ground-level fall with brain compression -admitted to neuro ICU, status post right craniectomy and evacuation 8/31, EVD placed on 9/4 and removed 9/8.  Management per primary, she is on Keppra , continue blood pressure control  Active problems Right subclavian and right axillary DVT -due to PICC line, now removed.  She is tolerating anticoagulation.  Will switch to oral agents when closer to discharge  Possible aspiration pneumonia-more short of breath this morning, chest x-ray with concern for pneumonia.  Placed on Unasyn  for about 5 days.  She is net positive as well intermittent fluid balance, will give Lasix  x 1 today  Essential hypertension-continue Coreg , irbesartan   Hypokalemia-replenish and continue to monitor  Hypothyroidism-continue Synthroid   History of catatonia/anxiety/depression-continue home medications  Anemia-no bleeding currently, monitor  Scheduled Meds:  busPIRone   7.5 mg Oral BID   carvedilol   12.5 mg Oral BID WC   Chlorhexidine  Gluconate Cloth  6  each Topical Daily   enoxaparin  (LOVENOX ) injection  90 mg Subcutaneous Q24H   feeding supplement (PROSource TF20)  60 mL Per Tube BID   furosemide   40 mg Intravenous Once   hydrALAZINE   100 mg Oral Q8H   irbesartan   300 mg Oral Daily   lamoTRIgine   150 mg Oral Daily   [START ON 03/22/2024] levothyroxine   100 mcg Oral Q0600   liver oil-zinc  oxide   Topical BID   mouth rinse  15 mL Mouth Rinse 4 times per day   polyethylene glycol  17 g Oral BID   risperiDONE   3 mg Oral QHS   senna-docusate  1 tablet Oral BID   sodium chloride  flush  10-40 mL Intracatheter Q12H   traZODone   50 mg Oral QHS   venlafaxine   150 mg Oral BID WC   Continuous Infusions:  ampicillin -sulbactam (UNASYN ) IV     feeding supplement (OSMOLITE 1.5 CAL) 40 mL/hr at 03/21/24 0800   PRN Meds:.acetaminophen  **OR** [DISCONTINUED] acetaminophen , fentaNYL  (SUBLIMAZE ) injection, hydrALAZINE , ipratropium-albuterol , labetalol , metoCLOPramide  (REGLAN ) injection, ondansetron  **OR** ondansetron  (ZOFRAN ) IV, mouth rinse, sodium chloride  flush  Current Outpatient Medications  Medication Instructions   acyclovir  (ZOVIRAX ) 400 mg, Oral, 3 times daily PRN   busPIRone  (BUSPAR ) 7.5 mg, Oral, 2 times daily   lamoTRIgine  (LAMICTAL ) 150 mg, Oral, Daily   levothyroxine  (SYNTHROID ) 100 mcg, Oral, Daily   metoprolol  succinate (TOPROL -XL) 25 mg, Oral, Daily   olmesartan (BENICAR) 20 mg, Daily   risperiDONE  (RISPERDAL ) 3 mg, Daily at bedtime   traZODone  (DESYREL ) 50 mg, Daily at bedtime   venlafaxine  XR (EFFEXOR -XR) 300 mg, Daily    Diet Orders (From admission, onward)  Start     Ordered   03/16/24 1143  Diet regular Room service appropriate? Yes with Assist; Fluid consistency: Thin  Diet effective now       Question Answer Comment  Room service appropriate? Yes with Assist   Fluid consistency: Thin      03/16/24 1142            DVT prophylaxis: SCDs Start: 03/12/24 0225   Lab Results  Component Value Date   PLT 237  03/21/2024      Code Status: Full Code  Family Communication: No family at bedside  Status is: Inpatient Remains inpatient appropriate because: Severity of illness  Level of care: Progressive   Objective: Vitals:   03/21/24 0634 03/21/24 0700 03/21/24 0800 03/21/24 0845  BP: 134/71  (!) 149/69 130/72  Pulse: 77 77 78 70  Resp: 17 17 19 12   Temp:   98 F (36.7 C)   TempSrc:   Axillary   SpO2: 98% 98% 98% 100%  Weight:      Height:        Intake/Output Summary (Last 24 hours) at 03/21/2024 0949 Last data filed at 03/21/2024 0914 Gross per 24 hour  Intake 1512.63 ml  Output 875 ml  Net 637.63 ml   Wt Readings from Last 3 Encounters:  03/21/24 69.4 kg  10/10/21 65.3 kg  10/10/21 65.3 kg    Examination:  Constitutional: NAD Eyes: no scleral icterus ENMT: Mucous membranes are moist.  Neck: normal, supple Respiratory: clear to auscultation bilaterally, no wheezing, no crackles.  Cardiovascular: Regular rate and rhythm, no murmurs / rubs / gallops. No LE edema.  Abdomen: non distended, no tenderness. Bowel sounds positive.  Musculoskeletal: no clubbing / cyanosis.  Skin: no rashes   Data Reviewed: I have independently reviewed following labs and imaging studies   CBC Recent Labs  Lab 03/18/24 0517 03/18/24 1348 03/19/24 0457 03/20/24 0528 03/21/24 0542  WBC 5.3 6.6 6.2 7.5 6.4  HGB 7.4* 7.3* 7.4* 7.6* 7.4*  HCT 23.3* 23.1* 22.8* 23.0* 22.3*  PLT 206 216 208 234 237  MCV 103.1* 104.1* 102.2* 100.0 97.4  MCH 32.7 32.9 33.2 33.0 32.3  MCHC 31.8 31.6 32.5 33.0 33.2  RDW 13.9 13.9 13.7 13.2 13.0    Recent Labs  Lab 03/16/24 1132 03/16/24 1513 03/17/24 0242 03/17/24 0826 03/18/24 0517 03/18/24 1348 03/18/24 2037 03/19/24 0457 03/19/24 1304 03/20/24 0528 03/21/24 0542  NA 138   < > 147*   < > 155*  155*   < > 150* 147* 140 137 136  K 3.8  --  3.5  --  3.7  --   --  3.6 3.7 3.4* 3.9  CL 108  --  122*  --  127*  --   --  115* 109 104 102  CO2 18*   --  20*  --  21*  --   --  21* 24 22 25   GLUCOSE 93  --  125*  --  130*  --   --  121* 98 133* 104*  BUN 11  --  19  --  17  --   --  20 20 23 22   CREATININE 0.74  --  0.67  --  0.67  --   --  0.78 0.68 0.65 0.59  CALCIUM  8.0*  --  7.9*  --  8.1*  --   --  8.1* 8.0* 7.9* 8.0*  MG 1.9  --  2.1  --  2.0  --   --  1.9  --   --   --   TSH  --   --   --   --   --   --   --  1.205  --   --   --    < > = values in this interval not displayed.    ------------------------------------------------------------------------------------------------------------------ No results for input(s): CHOL, HDL, LDLCALC, TRIG, CHOLHDL, LDLDIRECT in the last 72 hours.  Lab Results  Component Value Date   HGBA1C 5.1 10/17/2021   ------------------------------------------------------------------------------------------------------------------ Recent Labs    03/19/24 0457  TSH 1.205    Cardiac Enzymes No results for input(s): CKMB, TROPONINI, MYOGLOBIN in the last 168 hours.  Invalid input(s): CK ------------------------------------------------------------------------------------------------------------------ No results found for: BNP  CBG: Recent Labs  Lab 03/20/24 1522 03/20/24 1922 03/20/24 2323 03/21/24 0319 03/21/24 0738  GLUCAP 111* 137* 109* 122* 124*    Recent Results (from the past 240 hours)  MRSA Next Gen by PCR, Nasal     Status: None   Collection Time: 03/12/24  2:38 AM   Specimen: Nasal Mucosa; Nasal Swab  Result Value Ref Range Status   MRSA by PCR Next Gen NOT DETECTED NOT DETECTED Final    Comment: (NOTE) The GeneXpert MRSA Assay (FDA approved for NASAL specimens only), is one component of a comprehensive MRSA colonization surveillance program. It is not intended to diagnose MRSA infection nor to guide or monitor treatment for MRSA infections. Test performance is not FDA approved in patients less than 79 years old. Performed at [facility],  [address], [place], [place] [address]      Radiology Studies: CT HEAD WO CONTRAST ( ) Result Date: 03/20/2024 CLINICAL DATA:  Provided history: Subdural hematoma, follow-up examination. EXAM: CT HEAD WITHOUT CONTRAST TECHNIQUE: Contiguous axial images were obtained from the base of the skull through the vertex without intravenous contrast. RADIATION DOSE REDUCTION: This exam was performed according to the departmental dose-optimization program which includes automated exposure control, adjustment of the mA and/or kV according to patient size and/or use of iterative reconstruction technique. COMPARISON:  Prior head CT examinations 03/19/2024 and earlier. FINDINGS: Brain: A previously demonstrated left frontal approach ventricular catheter has been removed. Unchanged size and configuration of the ventricular system. A subdural hematoma along the right cerebral convexity has not significantly changed in size, again measuring up to 10 mm in thickness. Unchanged mass effect with 6 mm leftward midline shift. Trace subdural hemorrhage along the falx, also unchanged. No demarcated cortical infarct. No evidence of an intracranial mass. Vascular: No hyperdense vessel.  Atherosclerotic calcifications. Skull: Right-sided cranioplasty now with adjacent scalp staples. Sinuses/Orbits: No new finding. Other: Partially imaged nasoenteric tube. IMPRESSION: 1. A previously demonstrated left frontal approach ventricular catheter has been removed. Stable size and configuration of the ventricular system. 2. Unchanged size of the subdural hematoma along the right cerebral convexity with 6 mm leftward midline shift. 3. Unchanged trace subdural hemorrhage along the falx. 4. No evidence of an interval acute intracranial abnormality. Electronically Signed   By: Rockey Childs D.O.   On: 03/20/2024 15:37     Nilda Fendt, MD, PhD [facility]  Between 7 am - 7 pm I am available, please contact me via Amion (for  emergencies) or Securechat (non urgent messages)  Between 7 pm - 7 am I am not available, please contact night coverage MD/APP via Amion

## 2024-03-21 NOTE — Progress Notes (Signed)
 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 Stable on repeat imaging yet patient was exhibiting lethargy, so the pt underwent R craniotomy for SDH evacuation on 8/31. Was doing well postop, but on POD4 had another neurologic decline with slight increase in global cerebral edema. Hypertonic saline started EVD placed for ICP monitoring which was stable for multiple days with waxing/waning mental status. EEG has been checked which was normal. Pt developed a RUE DVT and heparin  gtt was started on 9/7.  LOS: 9 days    Plan: Lethargy episodes: has undergone craniotomy with stable postop scans. Has undergone EVD for 3 days of ICP monitoring with all normal ICP's despite waxing/waning exam. Will plan to monitor neurologically moving forward knowing that she has FND/catatonia episodes. Will be difficult to examine at times. Pupillary changes will be most reliable Goal normonatremia SBP<140 Activity as tolerated Diet as tolerated No collar or intervention for CCJ findings RUE DVT - on therapeutic lovenox  Rehab consult placed Ok with stepdown unit from nsgy standpoint  Subjective: Pt more awake and alert today  Objective: Vital signs in last 24 hours: Temp:  [98 F (36.7 C)-98.4 F (36.9 C)] 98 F (36.7 C) (09/10 0800) Pulse Rate:  [65-87] 78 (09/10 0800) Resp:  [10-20] 19 (09/10 0800) BP: (119-149)/(63-76) 149/69 (09/10 0800) SpO2:  [95 %-100 %] 98 % (09/10 0800) Weight:  [69.4 kg] 69.4 kg (09/10 0500)  Intake/Output from previous day: 09/09 0701 - 09/10 0700 In: 1524.1 [P.O.:350; I.V.:44.1; NG/GT:1130] Out: 1075 [Urine:1075] Intake/Output this shift: Total I/O In: 40 [NG/GT:40] Out: -   Exam: GCS 4E 4V 39M Conversational, slight confused with conversation Conjugate gaze, PERRL Grimace symmetric Commands BUE and BLE full strength Cranial incision c/d/I, staples EVD  incision c/d/I, sutures.  Lab Results: Recent Labs    03/20/24 0528 03/21/24 0542  WBC 7.5 6.4  HGB 7.6* 7.4*  HCT 23.0* 22.3*  PLT 234 237   BMET Recent Labs    03/20/24 0528 03/21/24 0542  NA 137 136  K 3.4* 3.9  CL 104 102  CO2 22 25  GLUCOSE 133* 104*  BUN 23 22  CREATININE 0.65 0.59  CALCIUM  7.9* 8.0*       Dorn SAUNDERS Ted Goodner 03/21/2024, 8:40 AM

## 2024-03-21 NOTE — Progress Notes (Signed)
 Speech Language Pathology Treatment: Dysphagia;Cognitive-Linguistic  Patient Details Name: Colleen [name] MRN: [id number] DOB: June 23, 1951 Today's Date: 03/21/2024 Time: 8785-8765 SLP Time Calculation (min) (ACUTE ONLY): 20 min  Assessment / Plan / Recommendation Clinical Impression  Pt observed with meal while sitting upright in the chair. She presents with seemingly increased congestion overall and a persistent throat clear in the presence and absence of POs, though it seems more immediate after sips of liquid. She is attentive to mastication of regular solids and achieves oral clearance but her intake is limited overall. Given increased signs of dysphagia this date in addition to fluctuating mentation and alertness overall, recommend proceeding with an MBS. Pending results, recommend continuing current diet.  She needs Mod assist with self-feeding and cueing for sequencing and problem solving. She can intermittently work through functional challenges but has difficulty carrying this over when alternating between tasks. While discussing post-acute plans with her family, pt can identify the need for ongoing rehabilitation but is unable to name specific deficits. Education was provided to pt and her sister in Social worker. SLP will continue following to target dysphagia and cognitive-linguistic goals.    HPI HPI: SNEHA WILLIG is a 73 yo female presenting to ED 8/29 after a fall with headache, neck pain, and nausea. CT shows R scalp/periorbital hematoma, acute SDH along the R cerebral convexity measuring 8 mm thickness with mass effect and 8 mm leftward midline shift as well as an acute SDH along the R aspect of the falx measuring up to 5 mm in thickness, and an epidural hematoma at the craniocervical junction. MRI shows small dorsal epidural hematoma between C1 vertebrae and occiput. Worsening neurostatus 8/31, taken for R craniotomy for subdural hematoma evacuation. PMH includes HTN, asthma, GERD,  anxiety/depression, thyroid  garter s/p total thyroidectomy 2013      SLP Plan  MBS          Recommendations  Diet recommendations: Regular;Thin liquid Liquids provided via: Cup;Straw Medication Administration: Whole meds with liquid Supervision: Staff to assist with self feeding;Full supervision/cueing for compensatory strategies Compensations: Minimize environmental distractions;Slow rate;Small sips/bites Postural Changes and/or Swallow Maneuvers: Seated upright 90 degrees                  Oral care BID   Frequent or constant Supervision/Assistance Dysphagia, unspecified (R13.10);Cognitive communication deficit (M58.158)     MBS     Damien Blumenthal, M.A., CCC-SLP Speech Language Pathology, Acute Rehabilitation Services  Secure Chat preferred [phone]   03/21/2024, 12:44 PM

## 2024-03-21 NOTE — Progress Notes (Signed)
 Pharmacy Antibiotic Note  Colleen [name] is a 73 y.o. female admitted on 03/09/2024 with pneumonia.  Pharmacy has been consulted for unasyn  dosing.  Plan: Unasyn  3g IV q6h F/u renal func, LOT  Height: 5' 4.02 (162.6 cm) Weight: 69.4 kg (153 lb) IBW/kg (Calculated) : 54.74  Temp (24hrs), Avg:98.2 F (36.8 C), Min:98 F (36.7 C), Max:98.4 F (36.9 C)  Recent Labs  Lab 03/18/24 0517 03/18/24 1348 03/19/24 0457 03/19/24 1304 03/20/24 0528 03/21/24 0542  WBC 5.3 6.6 6.2  --  7.5 6.4  CREATININE 0.67  --  0.78 0.68 0.65 0.59    Estimated Creatinine Clearance: 60.8 mL/min (by C-G formula based on SCr of 0.59 mg/dL).    Allergies  Allergen Reactions   Amlodipine  Other (See Comments)    Flushing, tingling in face, sore throat  (Norvasc )   Cyclobenzaprine Hcl Other (See Comments)    Seizures    Aripiprazole Other (See Comments)   Cymbalta  [Duloxetine  Hcl] Other (See Comments)    Made the depression worse   Tape Rash    Blisters    Antimicrobials this admission: Unasyn  9/10>  Dose adjustments this admission:   Microbiology results: 9/1 MRSA neg  Thank you for allowing pharmacy to be a part of this patient's care.  Sharyne Glatter, PharmD, BCCCP Critical Care Clinical Pharmacist 03/21/2024 10:15 AM

## 2024-03-22 DIAGNOSIS — S065XAA Traumatic subdural hemorrhage with loss of consciousness status unknown, initial encounter: Secondary | ICD-10-CM | POA: Diagnosis not present

## 2024-03-22 LAB — GLUCOSE, CAPILLARY
Glucose-Capillary: 114 mg/dL — ABNORMAL HIGH (ref 70–99)
Glucose-Capillary: 103 mg/dL — ABNORMAL HIGH (ref 70–99)
Glucose-Capillary: 95 mg/dL (ref 70–99)
Glucose-Capillary: 112 mg/dL — ABNORMAL HIGH (ref 70–99)
Glucose-Capillary: 96 mg/dL (ref 70–99)
Glucose-Capillary: 104 mg/dL — ABNORMAL HIGH (ref 70–99)

## 2024-03-22 LAB — CBC
HCT: 23.5 % — ABNORMAL LOW (ref 36.0–46.0)
Hemoglobin: 8 g/dL — ABNORMAL LOW (ref 12.0–15.0)
MCH: 32.5 pg (ref 26.0–34.0)
MCHC: 34 g/dL (ref 30.0–36.0)
MCV: 95.5 fL (ref 80.0–100.0)
Platelets: 242 K/uL (ref 150–400)
RBC: 2.46 MIL/uL — ABNORMAL LOW (ref 3.87–5.11)
RDW: 12.7 % (ref 11.5–15.5)
WBC: 7.2 K/uL (ref 4.0–10.5)
nRBC: 0.3 % — ABNORMAL HIGH (ref 0.0–0.2)

## 2024-03-22 LAB — COMPREHENSIVE METABOLIC PANEL WITH GFR
ALT: 24 U/L (ref 0–44)
AST: 17 U/L (ref 15–41)
Albumin: 2.7 g/dL — ABNORMAL LOW (ref 3.5–5.0)
Alkaline Phosphatase: 102 U/L (ref 38–126)
Anion gap: 12 (ref 5–15)
BUN: 13 mg/dL (ref 8–23)
CO2: 23 mmol/L (ref 22–32)
Calcium: 8.1 mg/dL — ABNORMAL LOW (ref 8.9–10.3)
Chloride: 97 mmol/L — ABNORMAL LOW (ref 98–111)
Creatinine, Ser: 0.6 mg/dL (ref 0.44–1.00)
GFR, Estimated: >60 mL/min (ref >60)
Glucose, Bld: 109 mg/dL — ABNORMAL HIGH (ref 70–99)
Potassium: 3.4 mmol/L — ABNORMAL LOW (ref 3.5–5.1)
Sodium: 132 mmol/L — ABNORMAL LOW (ref 135–145)
Total Bilirubin: 0.6 mg/dL (ref 0.0–1.2)
Total Protein: 5.7 g/dL — ABNORMAL LOW (ref 6.5–8.1)

## 2024-03-22 LAB — MAGNESIUM: Magnesium: 1.9 mg/dL (ref 1.7–2.4)

## 2024-03-22 LAB — PHOSPHORUS: Phosphorus: 3.9 mg/dL (ref 2.5–4.6)

## 2024-03-22 MED ORDER — FUROSEMIDE 10 MG/ML IJ SOLN
40.0000 mg | Freq: Once | INTRAMUSCULAR | Status: AC
  Administered 2024-03-22: 40 mg via INTRAVENOUS
  Filled 2024-03-22: qty 4

## 2024-03-22 MED ORDER — POTASSIUM CHLORIDE 20 MEQ PO PACK
40.0000 meq | PACK | Freq: Once | ORAL | Status: AC
  Administered 2024-03-22: 40 meq
  Filled 2024-03-22: qty 2

## 2024-03-22 NOTE — Progress Notes (Addendum)
 PROGRESS NOTE  Colleen [name] FMW:[id number] DOB: 08-Aug-1950 DOA: 03/09/2024 PCP: Leonel Cole, MD   LOS: 10 days   Brief Narrative / Interim history: 73 year old female with history of HTN, asthma, anxiety, history of catatonia, depression comes into the hospital with headache, neck pain, nausea.  She apparently had a fall at home, in the ER was found to have acute subdural hemorrhage along the right cerebral convexity with mass effect and midline shift.  She was admitted to the neuro ICU status post right craniotomy 8/31 for evacuation and EVD placement on 9/4 with removal on 9/8.  Hospital course complicated by right subclavian and axillary DVT in the setting of a PICC line, now placed on anticoagulation  Subjective / 24h Interval events: She is alert, awake, overall feeling well.  No abdominal pain, no nausea or vomiting.  Assesement and Plan: Principal problem Traumatic SDH due to ground-level fall with brain compression -admitted to neuro ICU, status post right craniectomy and evacuation 8/31, EVD placed on 9/4 and removed 9/8.  Management per primary, she is on Keppra , continue blood pressure control  Active problems Right subclavian and right axillary DVT -due to PICC line, now removed.  She is tolerating anticoagulation.  Will switch to oral agents when closer to discharge  Possible aspiration pneumonia-more short of breath on 9/10 in the morning, chest x-ray with concern for pneumonia.  Placed on Unasyn  for about 5 days, today is day 2/5.  She also received Lasix , repeat Lasix  today  Hypokalemia-replenish and continue to monitor  Essential hypertension-continue Coreg , irbesartan   Hypokalemia-replenish and continue to monitor  Hypothyroidism-continue Synthroid   History of catatonia/anxiety/depression-continue home medications  Anemia-no bleeding currently, monitor  Disposition-she needs SNF, however currently has an NG tube.  Will see how she does over the next day or 2,  if she continues to fail SLP, she may need a PEG tube  Scheduled Meds:  busPIRone   7.5 mg Per Tube BID   carvedilol   12.5 mg Per Tube BID WC   Chlorhexidine  Gluconate Cloth  6 each Topical Daily   enoxaparin  (LOVENOX ) injection  90 mg Subcutaneous Q24H   feeding supplement (PROSource TF20)  60 mL Per Tube BID   hydrALAZINE   100 mg Per Tube Q8H   irbesartan   300 mg Per Tube Daily   lamoTRIgine   150 mg Per Tube Daily   levothyroxine   100 mcg Per Tube Q0600   liver oil-zinc  oxide   Topical BID   mouth rinse  15 mL Mouth Rinse 4 times per day   polyethylene glycol  17 g Per Tube BID   risperiDONE   3 mg Per Tube QHS   senna-docusate  1 tablet Oral BID   sodium chloride  flush  10-40 mL Intracatheter Q12H   traZODone   50 mg Per Tube QHS   venlafaxine   150 mg Per Tube BID WC   Continuous Infusions:  ampicillin -sulbactam (UNASYN ) IV 3 g (03/22/24 0841)   feeding supplement (OSMOLITE 1.5 CAL) 40 mL/hr at 03/21/24 1800   PRN Meds:.acetaminophen  (TYLENOL ) oral liquid 160 mg/5 mL, fentaNYL  (SUBLIMAZE ) injection, hydrALAZINE , ipratropium-albuterol , labetalol , metoCLOPramide  (REGLAN ) injection, ondansetron  **OR** ondansetron  (ZOFRAN ) IV, mouth rinse, sodium chloride  flush  Current Outpatient Medications  Medication Instructions   acyclovir  (ZOVIRAX ) 400 mg, Oral, 3 times daily PRN   busPIRone  (BUSPAR ) 7.5 mg, Oral, 2 times daily   lamoTRIgine  (LAMICTAL ) 150 mg, Oral, Daily   levothyroxine  (SYNTHROID ) 100 mcg, Oral, Daily   metoprolol  succinate (TOPROL -XL) 25 mg, Oral, Daily   olmesartan (BENICAR) 20  mg, Daily   risperiDONE  (RISPERDAL ) 3 mg, Daily at bedtime   traZODone  (DESYREL ) 50 mg, Daily at bedtime   venlafaxine  XR (EFFEXOR -XR) 300 mg, Daily    Diet Orders (From admission, onward)     Start     Ordered   03/21/24 1528  Diet NPO time specified  Diet effective now       Comments: Ice chips in moderation with frequent oral care   03/21/24 1528            DVT prophylaxis: SCDs  Start: 03/12/24 0225   Lab Results  Component Value Date   PLT 242 03/22/2024      Code Status: Full Code  Family Communication: No family at bedside  Status is: Inpatient Remains inpatient appropriate because: Severity of illness  Level of care: Progressive   Objective: Vitals:   03/22/24 0500 03/22/24 0519 03/22/24 0754 03/22/24 0800  BP:  (!) 145/78  (!) 145/71  Pulse: 72 74  (!) 105  Resp: 13 18  (!) 21  Temp:   98.5 F (36.9 C)   TempSrc:   Axillary   SpO2: 100% 99%  97%  Weight:      Height:        Intake/Output Summary (Last 24 hours) at 03/22/2024 1019 Last data filed at 03/22/2024 0400 Gross per 24 hour  Intake 522.92 ml  Output 750 ml  Net -227.08 ml   Wt Readings from Last 3 Encounters:  03/21/24 69.4 kg  10/10/21 65.3 kg  10/10/21 65.3 kg    Examination:  Constitutional: NAD Eyes: lids and conjunctivae normal, no scleral icterus ENMT: mmm Neck: normal, supple Respiratory: clear to auscultation bilaterally, no wheezing, no crackles. Normal respiratory effort.  Cardiovascular: Regular rate and rhythm, no murmurs / rubs / gallops. No LE edema. Abdomen: soft, no distention, no tenderness. Bowel sounds positive.    Data Reviewed: I have independently reviewed following labs and imaging studies   CBC Recent Labs  Lab 03/18/24 1348 03/19/24 0457 03/20/24 0528 03/21/24 0542 03/22/24 0539  WBC 6.6 6.2 7.5 6.4 7.2  HGB 7.3* 7.4* 7.6* 7.4* 8.0*  HCT 23.1* 22.8* 23.0* 22.3* 23.5*  PLT 216 208 234 237 242  MCV 104.1* 102.2* 100.0 97.4 95.5  MCH 32.9 33.2 33.0 32.3 32.5  MCHC 31.6 32.5 33.0 33.2 34.0  RDW 13.9 13.7 13.2 13.0 12.7    Recent Labs  Lab 03/16/24 1132 03/16/24 1513 03/17/24 0242 03/17/24 0826 03/18/24 0517 03/18/24 1348 03/19/24 0457 03/19/24 1304 03/20/24 0528 03/21/24 0542 03/22/24 0539  NA 138   < > 147*   < > 155*  155*   < > 147* 140 137 136 132*  K 3.8  --  3.5  --  3.7  --  3.6 3.7 3.4* 3.9 3.4*  CL 108  --   122*  --  127*  --  115* 109 104 102 97*  CO2 18*  --  20*  --  21*  --  21* 24 22 25 23   GLUCOSE 93  --  125*  --  130*  --  121* 98 133* 104* 109*  BUN 11  --  19  --  17  --  20 20 23 22 13   CREATININE 0.74  --  0.67  --  0.67  --  0.78 0.68 0.65 0.59 0.60  CALCIUM  8.0*  --  7.9*  --  8.1*  --  8.1* 8.0* 7.9* 8.0* 8.1*  AST  --   --   --   --   --   --   --   --   --   --  17  ALT  --   --   --   --   --   --   --   --   --   --  24  ALKPHOS  --   --   --   --   --   --   --   --   --   --  102  BILITOT  --   --   --   --   --   --   --   --   --   --  0.6  ALBUMIN  --   --   --   --   --   --   --   --   --   --  2.7*  MG 1.9  --  2.1  --  2.0  --  1.9  --   --   --  1.9  TSH  --   --   --   --   --   --  1.205  --   --   --   --    < > = values in this interval not displayed.    ------------------------------------------------------------------------------------------------------------------ No results for input(s): CHOL, HDL, LDLCALC, TRIG, CHOLHDL, LDLDIRECT in the last 72 hours.  Lab Results  Component Value Date   HGBA1C 5.1 10/17/2021   ------------------------------------------------------------------------------------------------------------------ No results for input(s): TSH, T4TOTAL, T3FREE, THYROIDAB in the last 72 hours.  Invalid input(s): FREET3   Cardiac Enzymes No results for input(s): CKMB, TROPONINI, MYOGLOBIN in the last 168 hours.  Invalid input(s): CK ------------------------------------------------------------------------------------------------------------------ No results found for: BNP  CBG: Recent Labs  Lab 03/21/24 1535 03/21/24 1921 03/21/24 2311 03/22/24 0323 03/22/24 0738  GLUCAP 122* 117* 116* 114* 103*    No results found for this or any previous visit (from the past 240 hours).    Radiology Studies: DG Swallowing Func-Speech Pathology Result Date: 03/21/2024 Table formatting from the original  result was not included. Modified Barium Swallow Study Patient Details Name: Colleen [name] MRN: [id number] Date of Birth: [birth year] Today's Date: 03/21/2024 HPI/PMH: HPI: ARSEMA TUSING is a 73 yo female presenting to ED 8/29 after a fall with headache, neck pain, and nausea. CT shows R scalp/periorbital hematoma, acute SDH along the R cerebral convexity measuring 8 mm thickness with mass effect and 8 mm leftward midline shift as well as an acute SDH along the R aspect of the falx measuring up to 5 mm in thickness, and an epidural hematoma at the craniocervical junction. MRI shows small dorsal epidural hematoma between C1 vertebrae and occiput. Worsening neurostatus 8/31, taken for R craniotomy for subdural hematoma evacuation. PMH includes HTN, asthma, GERD, anxiety/depression, thyroid  garter s/p total thyroidectomy 2013 Clinical Impression: Pt exhibits moderate oropharyngeal dysphagia suspected to be further exacerbated by pt's mentation. Her oral phase is characterized by slow oral transit and incomplete labial seal, leading to gross anterior loss. Laryngeal elevation is complete but anterior hyoid movement and epiglottic deflection are significantly reduced, prohibiting laryngeal vestibule closure. There is consistent penetration during the swallow with all liquids that progresses to the vocal folds as the volume increases when mixed with residuals from the base of tongue and valleculae (PAS 5). She is sensate, initiating throat clearance or multiple swallows to clear the laryngeal vestibule but cannot clear the pharyngeal residue so frank penetration is cyclical. Trace aspiration occurs with thin liquids and pt initiates a cough response but it is ineffective (PAS 7). Her presentation overall  appears different from previous sessions with increased effort noted clinically during earlier visit and CXR showing increased R>L opacities. Recommend NPO except ice chips in moderation and medications given via  Cortrak. SLP will f/u to target use of strategies and assess readiness to resume PO diet. Factors that may increase risk of adverse event in presence of aspiration Noe & Lianne 2021): Factors that may increase risk of adverse event in presence of aspiration Noe & Lianne 2021): Poor general health and/or compromised immunity; Reduced cognitive function; Limited mobility; Frail or deconditioned; Weak cough; Presence of tubes (ETT, trach, NG, etc.) Recommendations/Plan: Swallowing Evaluation Recommendations Swallowing Evaluation Recommendations Recommendations: NPO; Ice chips PRN after oral care Medication Administration: Via alternative means Oral care recommendations: Oral care QID (4x/day); Oral care before ice chips/water Treatment Plan Treatment Plan Treatment recommendations: Therapy as outlined in treatment plan below Follow-up recommendations: Skilled nursing-short term rehab (<3 hours/day) Functional status assessment: Patient has had a recent decline in their functional status and demonstrates the ability to make significant improvements in function in a reasonable and predictable amount of time. Treatment frequency: Min 2x/week Treatment duration: 2 weeks Interventions: Aspiration precaution training; Patient/family education; Trials of upgraded texture/liquids; Diet toleration management by SLP Recommendations Recommendations for follow up therapy are one component of a multi-disciplinary discharge planning process, led by the attending physician.  Recommendations may be updated based on patient status, additional functional criteria and insurance authorization. Assessment: Orofacial Exam: Orofacial Exam Oral Cavity: Oral Hygiene: WFL Oral Cavity - Dentition: Adequate natural dentition Orofacial Anatomy: WFL Oral Motor/Sensory Function: WFL Anatomy: Anatomy: Suspected cervical osteophytes Boluses Administered: Boluses Administered Boluses Administered: Thin liquids (Level 0); Mildly thick liquids  (Level 2, nectar thick); Moderately thick liquids (Level 3, honey thick); Puree  Oral Impairment Domain: Oral Impairment Domain Lip Closure: Escape beyond mid-chin Tongue control during bolus hold: Posterior escape of less than half of bolus Bolus transport/lingual motion: Delayed initiation of tongue motion (oral holding) Oral residue: Trace residue lining oral structures Location of oral residue : Tongue; Palate Initiation of pharyngeal swallow : Pyriform sinuses  Pharyngeal Impairment Domain: Pharyngeal Impairment Domain Soft palate elevation: Trace column of contrast or air between SP and PW Laryngeal elevation: Complete superior movement of thyroid  cartilage with complete approximation of arytenoids to epiglottic petiole Anterior hyoid excursion: Partial anterior movement Epiglottic movement: Partial inversion Laryngeal vestibule closure: Incomplete, narrow column air/contrast in laryngeal vestibule Pharyngeal stripping wave : Present - diminished Pharyngeal contraction (A/P view only): N/A Pharyngoesophageal segment opening: Partial distention/partial duration, partial obstruction of flow Tongue base retraction: Narrow column of contrast or air between tongue base and PPW Pharyngeal residue: Collection of residue within or on pharyngeal structures Location of pharyngeal residue: Valleculae; Pharyngeal wall; Aryepiglottic folds; Tongue base  Esophageal Impairment Domain: No data recorded Pill: No data recorded Penetration/Aspiration Scale Score: Penetration/Aspiration Scale Score 5.  Material enters airway, CONTACTS cords and not ejected out: Mildly thick liquids (Level 2, nectar thick); Moderately thick liquids (Level 3, honey thick); Puree 7.  Material enters airway, passes BELOW cords and not ejected out despite cough attempt by patient: Thin liquids (Level 0) Compensatory Strategies: Compensatory Strategies Compensatory strategies: Yes Effortful swallow: Ineffective Ineffective Effortful Swallow: Thin  liquid (Level 0); Mildly thick liquid (Level 2, nectar thick); Moderately thick liquid (Level 3, honey thick)   General Information: Caregiver present: No  Diet Prior to this Study: Regular; Thin liquids (Level 0); Cortrak/Small bore NG tube   Temperature : Normal   Respiratory Status: [facility]   Supplemental  O2: None (Room air)   History of Recent Intubation: No  Behavior/Cognition: Lethargic/Drowsy; Requires cueing Self-Feeding Abilities: Needs set-up for self-feeding Baseline vocal quality/speech: Normal Volitional Cough: Able to elicit Volitional Swallow: Able to elicit Exam Limitations: No limitations Goal Planning: Prognosis for improved oropharyngeal function: Good Barriers to Reach Goals: Cognitive deficits No data recorded Patient/Family Stated Goal: none stated Consulted and agree with results and recommendations: Patient; Physician Pain: Pain Assessment Pain Assessment: Faces Faces Pain Scale: 0 Facial Expression: 0 Body Movements: 0 Muscle Tension: 0 Compliance with ventilator (intubated pts.): N/A Vocalization (extubated pts.): N/A CPOT Total: 0 Pain Location: generalized Pain Descriptors / Indicators: Discomfort Pain Intervention(s): Monitored during session End of Session: Start Time:SLP Start Time (ACUTE ONLY): 1501 Stop Time: SLP Stop Time (ACUTE ONLY): 1521 Time Calculation:SLP Time Calculation (min) (ACUTE ONLY): 20 min Charges: SLP Evaluations $ SLP Speech Visit: 1 Visit SLP Evaluations $MBS Swallow: 1 Procedure $Swallowing Treatment: 1 Procedure $Speech Treatment for Individual: 1 Procedure SLP visit diagnosis: SLP Visit Diagnosis: Dysphagia, oropharyngeal phase (R13.12) Past Medical History: Past Medical History: Diagnosis Date  Allergy   Anxiety   Arrhythmia   does not see cardiologist, sees pcp, Dr. 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   Heartburn   Herpes   on medication for outbreaks only  Hypertension   Seizures (HCC)   MEDICATION INDUCED  Urinary tract infection   hx of Past Surgical History: Past Surgical History: Procedure Laterality Date  CESAREAN SECTION    x 2  COLONOSCOPY    CRANIOTOMY Right 03/11/2024  Procedure: CRANIOTOMY HEMATOMA EVACUATION SUBDURAL;  Surgeon: Darnella Dorn SAUNDERS, MD;  Location: [facility] OR;  Service: Neurosurgery;  Laterality: Right;  ELBOW SURGERY    right  KNEE ARTHROSCOPY    right  PILONIDAL CYST EXCISION    spinal cystectomy    where pilonidal cyst was  THYROIDECTOMY  04/07/2012  Procedure: THYROIDECTOMY;  Surgeon: Sherlean JINNY Laughter, MD;  Location: [facility] OR;  Service: General;  Laterality: N/A;   Damien Blumenthal, M.A., CCC-SLP Speech Language Pathology, Acute Rehabilitation Services Secure Chat preferred [phone] 03/21/2024, 4:26 PM    Nilda Fendt, MD, PhD [facility]  Between 7 am - 7 pm I am available, please contact me via Amion (for emergencies) or Securechat (non urgent messages)  Between 7 pm - 7 am I am not available, please contact night coverage MD/APP via Amion

## 2024-03-22 NOTE — Progress Notes (Signed)
 Speech Language Pathology Treatment: Dysphagia  Patient Details Name: Colleen [name] MRN: [id number] DOB: November 08, 1950 Today's Date: 03/22/2024 Time: 1005-1017 SLP Time Calculation (min) (ACUTE ONLY): 12 min  Assessment / Plan / Recommendation Clinical Impression  Pt has been very lethargic this morning per RN, but after cleaning and repositioning by NT and SLP, she started to wake up more. She participated in oral care and accepted both ice chips and water. With the water especially she exhibited multiple swallows but only one instance of throat clearing. Attempted to challenge her more with larger or more consecutive volumes, but she would only take one small sip at a time. Given how much she continues to fluctuate, and that she can't be adequately challenged clinically, recommend that she continue NPO with ice chips when alert.   HPI HPI: Colleen [name] is a 73 yo female presenting to ED 8/29 after a fall with headache, neck pain, and nausea. CT shows R scalp/periorbital hematoma, acute SDH along the R cerebral convexity measuring 8 mm thickness with mass effect and 8 mm leftward midline shift as well as an acute SDH along the R aspect of the falx measuring up to 5 mm in thickness, and an epidural hematoma at the craniocervical junction. MRI shows small dorsal epidural hematoma between C1 vertebrae and occiput. Worsening neurostatus 8/31, taken for R craniotomy for subdural hematoma evacuation. PMH includes HTN, asthma, GERD, anxiety/depression, thyroid  garter s/p total thyroidectomy 2013      SLP Plan  Continue with current plan of care          Recommendations  Diet recommendations: NPO;Other(comment) (ice chips after oral care when alert) Medication Administration: Via alternative means                  Oral care QID;Oral care prior to ice chip/H20   Frequent or constant Supervision/Assistance Dysphagia, oropharyngeal phase (R13.12)     Continue with current plan of care      Leita SAILOR., M.A. CCC-SLP Acute Rehabilitation Services Office: [phone]  Secure chat preferred   03/22/2024, 11:11 AM

## 2024-03-22 NOTE — TOC Progression Note (Signed)
 Transition of Care [facility]) - Progression Note    Patient Details  Name: Colleen [name] MRN: [id number] Date of Birth: 14-May-1951  Transition of Care [facility]) CM/SW Contact  Inocente GORMAN Kindle, LCSW Phone Number: 03/22/2024, 10:09 AM  Clinical Narrative:    CSW continuing to follow. Will send out for SNF referrals once Cortrak is removed.    Expected Discharge Plan: Skilled Nursing Facility Barriers to Discharge: Continued Medical Work up, SNF Pending bed offer               Expected Discharge Plan and Services In-house Referral: Clinical Social Work Discharge Planning Services: CM Consult Post Acute Care Choice: Skilled Nursing Facility Living arrangements for the past 2 months: Single Family Home                                       Social Drivers of Health (SDOH) Interventions SDOH Screenings   Food Insecurity: No Food Insecurity (03/09/2024)  Housing: Low Risk  (03/09/2024)  Transportation Needs: No Transportation Needs (03/09/2024)  Utilities: Not At Risk (03/09/2024)  Alcohol Screen: Low Risk  (10/10/2021)  Depression (PHQ2-9): High Risk (10/09/2021)  Social Connections: Patient Declined (03/09/2024)  Tobacco Use: Medium Risk (03/09/2024)    Readmission Risk Interventions     No data to display

## 2024-03-22 NOTE — Progress Notes (Signed)
 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 Stable on repeat imaging yet patient was exhibiting lethargy, so the pt underwent R craniotomy for SDH evacuation on 8/31. Was doing well postop, but on POD4 had another neurologic decline with slight increase in global cerebral edema. Hypertonic saline started EVD placed for ICP monitoring which was stable for multiple days with waxing/waning mental status. EEG has been checked which was normal. Pt developed a RUE DVT and heparin  gtt was started on 9/7.  LOS: 10 days    Plan: Lethargy episodes: has undergone craniotomy with stable postop scans. Has undergone EVD for 3 days of ICP monitoring with all normal ICP's despite waxing/waning exam. Will plan to monitor neurologically moving forward knowing that she has FND/catatonia episodes. Will be difficult to examine at times. Pupillary changes will be most reliable Goal normonatremia SBP<140 Activity as tolerated Diet as tolerated No collar or intervention for CCJ findings RUE DVT - on therapeutic lovenox  Rehab consult placed Ok with floor unit from nsgy standpoint  Subjective: Neurologically stable  Objective: Vital signs in last 24 hours: Temp:  [97.6 F (36.4 C)-98.3 F (36.8 C)] 98.3 F (36.8 C) (09/11 0400) Pulse Rate:  [66-84] 74 (09/11 0519) Resp:  [12-22] 18 (09/11 0519) BP: (115-168)/(69-84) 145/78 (09/11 0519) SpO2:  [96 %-100 %] 99 % (09/11 0519)  Intake/Output from previous day: 09/10 0701 - 09/11 0700 In: 762.9 [P.O.:120; I.V.:3; NG/GT:440; IV Piggyback:199.9] Out: 750 [Urine:750] Intake/Output this shift: No intake/output data recorded.  Exam: GCS 4E 4V 42M Conversational, slight confused with conversation Conjugate gaze, PERRL Grimace symmetric Commands BUE and BLE full strength Cranial incision c/d/I, staples EVD incision c/d/I, sutures.  Lab  Results: Recent Labs    03/21/24 0542 03/22/24 0539  WBC 6.4 7.2  HGB 7.4* 8.0*  HCT 22.3* 23.5*  PLT 237 242   BMET Recent Labs    03/21/24 0542 03/22/24 0539  NA 136 132*  K 3.9 3.4*  CL 102 97*  CO2 25 23  GLUCOSE 104* 109*  BUN 22 13  CREATININE 0.59 0.60  CALCIUM  8.0* 8.1*       Colleen [name] 03/22/2024, 7:27 AM

## 2024-03-22 NOTE — Progress Notes (Signed)
 Physical Therapy Treatment Patient Details Name: Colleen [name] MRN: [id number] DOB: [birth year] Today's Date: 03/22/2024   History of Present Illness Colleen [name] is a 73 yo female presenting to ED 8/29 after a fall with headache, neck pain, and nausea. CT shows R scalp/periorbital hematoma, acute SDH along the R cerebral convexity measuring 8 mm thickness with mass effect and 8 mm leftward midline shift as well as an acute SDH along the R aspect of the falx measuring up to 5 mm in thickness, and an epidural hematoma at the craniocervical junction. MRI shows small dorsal epidural hematoma between C1 vertebrae and occiput. Worsening neurostatus 8/31, taken for R craniotomy for subdural hematoma evacuation. s/p EVD placement 9/4, removed 9/8. New dx LUE DVT 9/7 started on heparin  9/7, MD okayed mobility as of 9/8. PMH includes HTN, asthma, GERD, anxiety/depression, thyroid  garter s/p total thyroidectomy 2013    PT Comments  Pt is progressing mobility well during today's session. Pt not requiring physical assist for sit to stand transfers from the recliner but needs CGA to steady. Pt demonstrating left inattention as she consistently drifts to the left during ambulation and requires consistent minA to correct drift for safety. Pt gave cues for upright and walker management during ambulation. Pt able to walker short household distance in hallway using RW and chair follow. Patient will benefit from continued inpatient follow up therapy, <3 hours/day to progress mobility and functional endurance. PT to follow acutely.     If plan is discharge home, recommend the following: Help with stairs or ramp for entrance;Assist for transportation;Supervision due to cognitive status;A lot of help with walking and/or transfers;A lot of help with bathing/dressing/bathroom   Can travel by private vehicle     Yes  Equipment Recommendations  Rollator (4 wheels);BSC/3in1    Recommendations for Other Services        Precautions / Restrictions Precautions Precautions: Fall Restrictions Weight Bearing Restrictions Per Provider Order: No     Mobility  Bed Mobility               General bed mobility comments: In recliner at start and end of session    Transfers Overall transfer level: Needs assistance Equipment used: Rolling walker (2 wheels) Transfers: Sit to/from Stand Sit to Stand: Contact guard assist           General transfer comment: Sit to stand x2. Assist to stready once standing. Increased time to rise    Ambulation/Gait Ambulation/Gait assistance: Min assist Gait Distance (Feet): 75 Feet (+75 after seated rest break) Assistive device: Rolling walker (2 wheels) Gait Pattern/deviations: Step-through pattern, Decreased stride length, Trunk flexed, Drifts right/left Gait velocity: decr     General Gait Details: Pt with left drift. Chair follow for safety. Cues for walker management (upright, staying close to walker). Assist at walker to bring R to avoid obstacles on L.   Stairs             Wheelchair Mobility     Tilt Bed    Modified Rankin (Stroke Patients Only)       Balance Overall balance assessment: Needs assistance Sitting-balance support: Feet supported Sitting balance-Leahy Scale: Fair     Standing balance support: Bilateral upper extremity supported, During functional activity, Reliant on assistive device for balance Standing balance-Leahy Scale: Fair Standing balance comment: Benefits from RW  Communication Communication Communication: No apparent difficulties  Cognition Arousal: Alert Behavior During Therapy: Flat affect   PT - Cognitive impairments: Safety/Judgement, Problem solving, Sequencing                       PT - Cognition Comments: Cues to avoid obstacles to prevent running into things Following commands: Impaired Following commands impaired: Follows one step commands with  increased time    Cueing Cueing Techniques: Verbal cues, Gestural cues, Tactile cues  Exercises      General Comments General comments (skin integrity, edema, etc.): VSS on RA      Pertinent Vitals/Pain Pain Assessment Pain Assessment: Faces Faces Pain Scale: No hurt    Home Living                          Prior Function            PT Goals (current goals can now be found in the care plan section) Acute Rehab PT Goals Patient Stated Goal: did not state Time For Goal Achievement: 03/28/24 Potential to Achieve Goals: Good Progress towards PT goals: Progressing toward goals    Frequency    Min 2X/week      PT Plan      Co-evaluation              AM-PAC PT 6 Clicks Mobility   Outcome Measure  Help needed turning from your back to your side while in a flat bed without using bedrails?: A Little Help needed moving from lying on your back to sitting on the side of a flat bed without using bedrails?: A Little Help needed moving to and from a bed to a chair (including a wheelchair)?: A Little Help needed standing up from a chair using your arms (e.g., wheelchair or bedside chair)?: A Little Help needed to walk in hospital room?: A Little Help needed climbing 3-5 steps with a railing? : A Lot 6 Click Score: 17    End of Session Equipment Utilized During Treatment: Gait belt Activity Tolerance: Patient tolerated treatment well Patient left: in chair;with call bell/phone within reach;with chair alarm set Nurse Communication: Mobility status PT Visit Diagnosis: Unsteadiness on feet (R26.81);Difficulty in walking, not elsewhere classified (R26.2)     Time: 8746-8682 PT Time Calculation (min) (ACUTE ONLY): 24 min  Charges:    $Gait Training: 8-22 mins $Therapeutic Activity: 8-22 mins PT General Charges $$ ACUTE PT VISIT: 1 Visit                     Quintin Campi, SPT  Acute Rehab  [phone]    Quintin Campi 03/22/2024, 2:50 PM

## 2024-03-23 ENCOUNTER — Inpatient Hospital Stay (HOSPITAL_COMMUNITY)

## 2024-03-23 ENCOUNTER — Other Ambulatory Visit (HOSPITAL_COMMUNITY): Payer: Self-pay

## 2024-03-23 ENCOUNTER — Telehealth (HOSPITAL_COMMUNITY): Payer: Self-pay | Admitting: Pharmacy Technician

## 2024-03-23 DIAGNOSIS — S065XAA Traumatic subdural hemorrhage with loss of consciousness status unknown, initial encounter: Secondary | ICD-10-CM | POA: Diagnosis not present

## 2024-03-23 LAB — CBC
HCT: 23.5 % — ABNORMAL LOW (ref 36.0–46.0)
Hemoglobin: 7.9 g/dL — ABNORMAL LOW (ref 12.0–15.0)
MCH: 32.1 pg (ref 26.0–34.0)
MCHC: 33.6 g/dL (ref 30.0–36.0)
MCV: 95.5 fL (ref 80.0–100.0)
Platelets: 279 K/uL (ref 150–400)
RBC: 2.46 MIL/uL — ABNORMAL LOW (ref 3.87–5.11)
RDW: 12.7 % (ref 11.5–15.5)
WBC: 5.8 K/uL (ref 4.0–10.5)
nRBC: 0 % (ref 0.0–0.2)

## 2024-03-23 LAB — GLUCOSE, CAPILLARY
Glucose-Capillary: 105 mg/dL — ABNORMAL HIGH (ref 70–99)
Glucose-Capillary: 130 mg/dL — ABNORMAL HIGH (ref 70–99)
Glucose-Capillary: 93 mg/dL (ref 70–99)
Glucose-Capillary: 120 mg/dL — ABNORMAL HIGH (ref 70–99)
Glucose-Capillary: 119 mg/dL — ABNORMAL HIGH (ref 70–99)
Glucose-Capillary: 116 mg/dL — ABNORMAL HIGH (ref 70–99)

## 2024-03-23 LAB — COMPREHENSIVE METABOLIC PANEL WITH GFR
ALT: 22 U/L (ref 0–44)
AST: 19 U/L (ref 15–41)
Albumin: 2.5 g/dL — ABNORMAL LOW (ref 3.5–5.0)
Alkaline Phosphatase: 92 U/L (ref 38–126)
Anion gap: 10 (ref 5–15)
BUN: 19 mg/dL (ref 8–23)
CO2: 23 mmol/L (ref 22–32)
Calcium: 7.9 mg/dL — ABNORMAL LOW (ref 8.9–10.3)
Chloride: 101 mmol/L (ref 98–111)
Creatinine, Ser: 0.62 mg/dL (ref 0.44–1.00)
GFR, Estimated: >60 mL/min (ref >60)
Glucose, Bld: 108 mg/dL — ABNORMAL HIGH (ref 70–99)
Potassium: 3.1 mmol/L — ABNORMAL LOW (ref 3.5–5.1)
Sodium: 134 mmol/L — ABNORMAL LOW (ref 135–145)
Total Bilirubin: 0.4 mg/dL (ref 0.0–1.2)
Total Protein: 5.2 g/dL — ABNORMAL LOW (ref 6.5–8.1)

## 2024-03-23 LAB — MAGNESIUM: Magnesium: 1.8 mg/dL (ref 1.7–2.4)

## 2024-03-23 MED ORDER — OSMOLITE 1.5 CAL PO LIQD
1000.0000 mL | ORAL | Status: DC
  Administered 2024-03-23 – 2024-03-24 (×2): 1000 mL

## 2024-03-23 MED ORDER — POTASSIUM CHLORIDE 20 MEQ PO PACK: 40.0000 meq | PACK | Freq: Two times a day (BID) | ORAL | Status: DC

## 2024-03-23 MED ORDER — ENSURE PLUS HIGH PROTEIN PO LIQD
237.0000 mL | Freq: Two times a day (BID) | ORAL | Status: DC
  Administered 2024-03-24 – 2024-03-26 (×4): 237 mL via ORAL

## 2024-03-23 MED ORDER — PROSOURCE TF20 ENFIT COMPATIBL EN LIQD
60.0000 mL | Freq: Every day | ENTERAL | Status: DC
  Administered 2024-03-24 – 2024-03-26 (×3): 60 mL
  Filled 2024-03-23 (×3): qty 60

## 2024-03-23 MED ORDER — ENOXAPARIN SODIUM 100 MG/ML IJ SOSY
100.0000 mg | PREFILLED_SYRINGE | INTRAMUSCULAR | Status: DC
  Administered 2024-03-23 – 2024-03-29 (×7): 100 mg via SUBCUTANEOUS
  Filled 2024-03-23 (×8): qty 1

## 2024-03-23 MED ORDER — POTASSIUM CHLORIDE 20 MEQ PO PACK
40.0000 meq | PACK | Freq: Two times a day (BID) | ORAL | Status: AC
  Administered 2024-03-23 (×2): 40 meq
  Filled 2024-03-23 (×2): qty 2

## 2024-03-23 NOTE — Plan of Care (Signed)

## 2024-03-23 NOTE — Evaluation (Signed)
 Modified Barium Swallow Study  Patient Details  Name: Colleen [name] MRN: [id number] Date of Birth: 09-11-1950  Today's Date: 03/23/2024  Modified Barium Swallow completed.  Full report located under Chart Review in the Imaging Section.  History of Present Illness Colleen [name] is a 73 yo female presenting to ED 8/29 after a fall with headache, neck pain, and nausea. CT shows R scalp/periorbital hematoma, acute SDH along the R cerebral convexity measuring 8 mm thickness with mass effect and 8 mm leftward midline shift as well as an acute SDH along the R aspect of the falx measuring up to 5 mm in thickness, and an epidural hematoma at the craniocervical junction. MRI shows small dorsal epidural hematoma between C1 vertebrae and occiput. Worsening neurostatus 8/31, taken for R craniotomy for subdural hematoma evacuation. PMH includes HTN, asthma, GERD, anxiety/depression, thyroid  garter s/p total thyroidectomy 2013   Clinical Impression Pt shows improvement from most recent MBS, most likely attributable to her improved alertness and mentation. Her oral phase is more swift and controlled overall, although she does have slow mastication and incomplete clearance of the barium tablet. Pharyngeally, she still has reduced anterior hyoid excursion, laryngeal vestibule closure, and epiglottic inversion, which contribute to trace penetration with thin and nectar thick liquids. Although it does not always clear immediately, it consistently clears with subsequent swallows or spontaneous throat clears. Pharyngeal residue is also less overally, with small amounts primarily on the base of tongue and in the valleculae, but they do not enter into the airway. Recommend that pt resume a diet of Dys 2 solids and thin liquids, although she will likely need careful assistance during meals. If she is not fully allert, she is likely to be at risk for aspiration as was noted on previous MBS.  Factors that may increase  risk of adverse event in presence of aspiration Noe & Lianne 2021): Poor general health and/or compromised immunity;Reduced cognitive function;Limited mobility;Frail or deconditioned;Weak cough;Presence of tubes (ETT, trach, NG, etc.)  Swallow Evaluation Recommendations Recommendations: PO diet PO Diet Recommendation: Dysphagia 2 (Finely chopped);Thin liquids (Level 0) Liquid Administration via: Cup;Straw Medication Administration: Whole meds with puree Supervision: Staff to assist with self-feeding;Full supervision/cueing for swallowing strategies Swallowing strategies  : Minimize environmental distractions;Slow rate;Small bites/sips Postural changes: Position pt fully upright for meals;Stay upright 30-60 min after meals Oral care recommendations: Oral care BID (2x/day)      Leita SAILOR., M.A. CCC-SLP Acute Rehabilitation Services Office: [phone]  Secure chat preferred  03/23/2024,3:49 PM

## 2024-03-23 NOTE — Plan of Care (Signed)
 Patient ID: Colleen [name], female   DOB: 06/27/1951, 73 y.o.   MRN: [id number]  Problem: Education: Goal: Knowledge of General Education information will improve Description: Including pain rating scale, medication(s)/side effects and non-pharmacologic comfort measures Outcome: Progressing   Problem: Health Behavior/Discharge Planning: Goal: Ability to manage health-related needs will improve Outcome: Progressing   Problem: Clinical Measurements: Goal: Ability to maintain clinical measurements within normal limits will improve Outcome: Progressing Goal: Will remain free from infection Outcome: Progressing Goal: Diagnostic test results will improve Outcome: Progressing Goal: Respiratory complications will improve Outcome: Progressing Goal: Cardiovascular complication will be avoided Outcome: Progressing   Problem: Activity: Goal: Risk for activity intolerance will decrease Outcome: Progressing   Problem: Nutrition: Goal: Adequate nutrition will be maintained Outcome: Progressing   Problem: Coping: Goal: Level of anxiety will decrease Outcome: Progressing   Problem: Elimination: Goal: Will not experience complications related to bowel motility Outcome: Progressing Goal: Will not experience complications related to urinary retention Outcome: Progressing   Problem: Pain Managment: Goal: General experience of comfort will improve and/or be controlled Outcome: Progressing   Problem: Safety: Goal: Ability to remain free from injury will improve Outcome: Progressing   Problem: Skin Integrity: Goal: Risk for impaired skin integrity will decrease Outcome: Progressing   Problem: Education: Goal: Knowledge of the prescribed therapeutic regimen will improve Outcome: Progressing   Problem: Clinical Measurements: Goal: Usual level of consciousness will be regained or maintained. Outcome: Progressing Goal: Neurologic status will improve Outcome: Progressing Goal:  Ability to maintain intracranial pressure will improve Outcome: Progressing   Problem: Skin Integrity: Goal: Demonstration of wound healing without infection will improve Outcome: Progressing  Colleen JONETTA Collier, RN

## 2024-03-23 NOTE — Telephone Encounter (Signed)
 Patient Product/process development scientist completed.    The patient is insured through [employer]. Patient has Medicare and is not eligible for a copay card, but may be able to apply for patient assistance or Medicare RX Payment Plan (Patient Must reach out to their plan, if eligible for payment plan), if available.    Ran test claim for Eliquis 5 mg and the current 30 day co-pay is $147.17 due to a $590.00 deductible.  Ran test claim for Xarelto 20mg  and the current 30 day co-pay is $145.65 due to a $590.00 deductible.  This test claim was processed through [facility]- copay amounts may vary at other pharmacies due to pharmacy/plan contracts, or as the patient moves through the different stages of their insurance plan.     Colleen [name], CPHT Pharmacy Technician III Certified Patient Advocate [facility] Patient Advocate Team Direct Number: [phone]  Fax: [phone]

## 2024-03-23 NOTE — Progress Notes (Signed)
 Nutrition Follow-up  DOCUMENTATION CODES:  Not applicable  INTERVENTION:  Recommend diet per SLP recommendation: DYS2, thin Ensure Plus High Protein po BID, each supplement provides 350 kcal and 20 grams of protein Adjust to nocturnal feeds: Osmolite 1.5 at 75 ml/h x 12 hours (900 ml per day) Prosource TF20 60 ml 1x/d Provides 1430 kcal, 76 gm protein, 686 ml free water daily 48-hour kcal count  NUTRITION DIAGNOSIS:  Inadequate oral intake related to lethargy/confusion as evidenced by meal completion < 50%. - remains applicable  GOAL:  Patient will meet greater than or equal to 90% of their needs - progressing  MONITOR:  TF tolerance, PO intake  REASON FOR ASSESSMENT:   (cortrak)    ASSESSMENT:  Pt with PMH of HTN, asthma, GERD, anxiety, and depression admitted post fall with acute SDH.  8/29 - admitted for SDH and cervial spine hematoma 8/31 - s/p R crani for SDH evacuation  9/2 - confused with worsening SDH 9/5 - s/p cortrak placement; tip gastric  Pt resting in bed at the time of assessment. Awake and alert and able to recall details about her care. Pt's mentation has been inconsistent flucuating between alert and extreme lethargy. Pt recalls she has been NPO for the last few days and that she was not able to fully participate in bedside swallow yesterday. Pt set to undergo MBS today as decision needs to be made about longterm feeding options.   Discussed with MD and SLP later in day, pt was able to have diet advanced, but SLP again reiterated it would be important to only offer PO when pt is fully awake and alert. Will adjust TF to nocturnal and start kcal count over the weekend to determine if mentation fluctuations are beginning to resolve or if pt may need longer-term enteral support to allow more time for recovery.   Discussed plan with RN.   Admit weight: 70.3 kg  Current weight: 69.4 kg    Average Meal Intake: 9/4-9/10: 30% intake x 7 recorded  meals  Nutritionally Relevant Medications: Scheduled Meds:  PROSource TF20  60 mL Per Tube BID   levothyroxine   100 mcg Per Tube Q0600   polyethylene glycol  17 g Per Tube BID   senna-docusate  1 tablet Oral BID   Continuous Infusions:  ampicillin -sulbactam (UNASYN ) IV 3 g (03/23/24 0505)   feeding supplement (OSMOLITE 1.5 CAL) 40 mL/hr at 03/23/24 0000   PRN Meds: metoCLOPramide , ondansetron   Labs Reviewed: Sodium 134 Potassium 3.1 CBG ranges from 93-120 mg/dL over the last 24 hours  NUTRITION - FOCUSED PHYSICAL EXAM: Flowsheet Row Most Recent Value  Orbital Region No depletion  Upper Arm Region No depletion  Thoracic and Lumbar Region No depletion  Buccal Region Mild depletion  Temple Region Unable to assess  [edema]  Clavicle Bone Region No depletion  Clavicle and Acromion Bone Region No depletion  Scapular Bone Region No depletion  Dorsal Hand No depletion  Patellar Region Moderate depletion  Anterior Thigh Region Moderate depletion  Posterior Calf Region Mild depletion  Edema (RD Assessment) --  [facial]  Hair Reviewed  Eyes Reviewed  Mouth Reviewed  Skin Reviewed  Nails Reviewed    Diet Order:   Diet Order             DIET DYS 2 Room service appropriate? Yes with Assist; Fluid consistency: Thin  Diet effective now                   EDUCATION NEEDS:  Not appropriate for education at this time  Skin:  Skin Assessment: Reviewed RN Assessment (R crain incision/staples; traumatic wound to R arm from fall)  Last BM:  9/12 - type 7  Height:  Ht Readings from Last 1 Encounters:  03/09/24 5' 4.02 (1.626 m)    Weight:  Wt Readings from Last 1 Encounters:  03/23/24 70.3 kg    Ideal Body Weight:  54.5 kg  BMI:  Body mass index is 26.59 kg/m.  Estimated Nutritional Needs:  Kcal:  1500-1700 Protein:  80-100 grams Fluid:  > 1.5L/day    Vernell Lukes, RD, LDN, CNSC Registered Dietitian II Please reach out via secure chat

## 2024-03-23 NOTE — Progress Notes (Addendum)
 PROGRESS NOTE  Colleen [name] FMW:[id number] DOB: 03/10/1951 DOA: 03/09/2024 PCP: Leonel Cole, MD   LOS: 11 days   Brief Narrative / Interim history: 73 year old female with history of HTN, asthma, anxiety, history of catatonia, depression comes into the hospital with headache, neck pain, nausea.  She apparently had a fall at home, in the ER was found to have acute subdural hemorrhage along the right cerebral convexity with mass effect and midline shift.  She was admitted to the neuro ICU status post right craniotomy 8/31 for evacuation and EVD placement on 9/4 with removal on 9/8.  Hospital course complicated by right subclavian and axillary DVT in the setting of a PICC line, now placed on anticoagulation  Subjective / 24h Interval events: She is alert, slightly confused today.  Denies any headaches, no chest pain, no nausea or vomiting.  Assesement and Plan: Principal problem Traumatic SDH due to ground-level fall with brain compression -admitted to neuro ICU, status post right craniectomy and evacuation 8/31, EVD placed on 9/4 and removed 9/8.  Management per neurosurgery  Active problems Right subclavian and right axillary DVT -due to PICC line, now removed.  She is tolerating anticoagulation.  Will switch to oral agents when closer to discharge  Dysphagia-likely in the setting of #1.  Discussed with neurosurgery,  it may be up to several weeks before she has some recovery.  Discussed with the patient as well as her daughters, that if she continues to fail swallow eval probably a PEG tube is in order for her to be able to leave the hospital and go to an SNF, as she cannot be discharged with a core track.  Family in agreement for PEG.  Even if she passes swallow eval, I am not sure if she would be able to meet her nutritional needs, but will see how she does today.  RD consulted, may need calorie count over the weekend if she passes swallow eval today  Possible aspiration pneumonia-more  short of breath on 9/10 in the morning, chest x-ray with concern for pneumonia.  Placed on Unasyn  for about 5 days, today is day 3/5.  She is status post 2 doses of Lasix , breathing has now improved  Hypokalemia-replaced today again and continue to monitor.  Magnesium  normal  Essential hypertension-continue Coreg , irbesartan .   Hypothyroidism-continue Synthroid   History of catatonia/anxiety/depression-continue home medications  Anemia-of chronic disease, no bleeding currently, monitor  Disposition-she needs SNF, however currently has an NG tube, potentially to be converted to a PEG based on speech eval today  Scheduled Meds:  busPIRone   7.5 mg Per Tube BID   carvedilol   12.5 mg Per Tube BID WC   Chlorhexidine  Gluconate Cloth  6 each Topical Daily   enoxaparin  (LOVENOX ) injection  100 mg Subcutaneous Q24H   feeding supplement (PROSource TF20)  60 mL Per Tube BID   hydrALAZINE   100 mg Per Tube Q8H   irbesartan   300 mg Per Tube Daily   lamoTRIgine   150 mg Per Tube Daily   levothyroxine   100 mcg Per Tube Q0600   liver oil-zinc  oxide   Topical BID   mouth rinse  15 mL Mouth Rinse 4 times per day   polyethylene glycol  17 g Per Tube BID   potassium chloride   40 mEq Per Tube BID   risperiDONE   3 mg Per Tube QHS   senna-docusate  1 tablet Oral BID   sodium chloride  flush  10-40 mL Intracatheter Q12H   traZODone   50 mg Per Tube  QHS   venlafaxine   150 mg Per Tube BID WC   Continuous Infusions:  ampicillin -sulbactam (UNASYN ) IV 3 g (03/23/24 1003)   feeding supplement (OSMOLITE 1.5 CAL) 40 mL/hr at 03/23/24 0000   PRN Meds:.acetaminophen  (TYLENOL ) oral liquid 160 mg/5 mL, fentaNYL  (SUBLIMAZE ) injection, hydrALAZINE , ipratropium-albuterol , labetalol , metoCLOPramide  (REGLAN ) injection, ondansetron  **OR** ondansetron  (ZOFRAN ) IV, mouth rinse, sodium chloride  flush  Current Outpatient Medications  Medication Instructions   acyclovir  (ZOVIRAX ) 400 mg, Oral, 3 times daily PRN   busPIRone   (BUSPAR ) 7.5 mg, Oral, 2 times daily   lamoTRIgine  (LAMICTAL ) 150 mg, Oral, Daily   levothyroxine  (SYNTHROID ) 100 mcg, Oral, Daily   metoprolol  succinate (TOPROL -XL) 25 mg, Oral, Daily   olmesartan (BENICAR) 20 mg, Daily   risperiDONE  (RISPERDAL ) 3 mg, Daily at bedtime   traZODone  (DESYREL ) 50 mg, Daily at bedtime   venlafaxine  XR (EFFEXOR -XR) 300 mg, Daily    Diet Orders (From admission, onward)     Start     Ordered   03/21/24 1528  Diet NPO time specified  Diet effective now       Comments: Ice chips in moderation with frequent oral care   03/21/24 1528            DVT prophylaxis: SCDs Start: 03/12/24 0225   Lab Results  Component Value Date   PLT 279 03/23/2024      Code Status: Full Code  Family Communication: No family at bedside  Status is: Inpatient Remains inpatient appropriate because: Severity of illness  Level of care: Progressive   Objective: Vitals:   03/23/24 0450 03/23/24 0505 03/23/24 0801 03/23/24 1000  BP: (!) 140/83 (!) 140/83 (!) 155/83   Pulse: 79  90   Resp: 16  18   Temp: 97.6 F (36.4 C)  98.2 F (36.8 C)   TempSrc: Axillary  Oral   SpO2: 98%  95%   Weight:    73.6 kg  Height:        Intake/Output Summary (Last 24 hours) at 03/23/2024 1036 Last data filed at 03/23/2024 9361 Gross per 24 hour  Intake 920.06 ml  Output 600 ml  Net 320.06 ml   Wt Readings from Last 3 Encounters:  03/23/24 73.6 kg  10/10/21 65.3 kg  10/10/21 65.3 kg    Examination:  Constitutional: NAD Eyes: lids and conjunctivae normal, no scleral icterus ENMT: mmm Neck: normal, supple Respiratory: clear to auscultation bilaterally, no wheezing, no crackles. Normal respiratory effort.  Cardiovascular: Regular rate and rhythm, no murmurs / rubs / gallops. No LE edema. Abdomen: soft, no distention, no tenderness. Bowel sounds positive.    Data Reviewed: I have independently reviewed following labs and imaging studies   CBC Recent Labs  Lab  03/19/24 0457 03/20/24 0528 03/21/24 0542 03/22/24 0539 03/23/24 0219  WBC 6.2 7.5 6.4 7.2 5.8  HGB 7.4* 7.6* 7.4* 8.0* 7.9*  HCT 22.8* 23.0* 22.3* 23.5* 23.5*  PLT 208 234 237 242 279  MCV 102.2* 100.0 97.4 95.5 95.5  MCH 33.2 33.0 32.3 32.5 32.1  MCHC 32.5 33.0 33.2 34.0 33.6  RDW 13.7 13.2 13.0 12.7 12.7    Recent Labs  Lab 03/17/24 0242 03/17/24 0826 03/18/24 0517 03/18/24 1348 03/19/24 0457 03/19/24 1304 03/20/24 0528 03/21/24 0542 03/22/24 0539 03/23/24 0219  NA 147*   < > 155*  155*   < > 147* 140 137 136 132* 134*  K 3.5  --  3.7  --  3.6 3.7 3.4* 3.9 3.4* 3.1*  CL 122*  --  127*  --  115* 109 104 102 97* 101  CO2 20*  --  21*  --  21* 24 22 25 23 23   GLUCOSE 125*  --  130*  --  121* 98 133* 104* 109* 108*  BUN 19  --  17  --  20 20 23 22 13 19   CREATININE 0.67  --  0.67  --  0.78 0.68 0.65 0.59 0.60 0.62  CALCIUM  7.9*  --  8.1*  --  8.1* 8.0* 7.9* 8.0* 8.1* 7.9*  AST  --   --   --   --   --   --   --   --  17 19  ALT  --   --   --   --   --   --   --   --  24 22  ALKPHOS  --   --   --   --   --   --   --   --  102 92  BILITOT  --   --   --   --   --   --   --   --  0.6 0.4  ALBUMIN  --   --   --   --   --   --   --   --  2.7* 2.5*  MG 2.1  --  2.0  --  1.9  --   --   --  1.9 1.8  TSH  --   --   --   --  1.205  --   --   --   --   --    < > = values in this interval not displayed.    ------------------------------------------------------------------------------------------------------------------ No results for input(s): CHOL, HDL, LDLCALC, TRIG, CHOLHDL, LDLDIRECT in the last 72 hours.  Lab Results  Component Value Date   HGBA1C 5.1 10/17/2021   ------------------------------------------------------------------------------------------------------------------ No results for input(s): TSH, T4TOTAL, T3FREE, THYROIDAB in the last 72 hours.  Invalid input(s): FREET3   Cardiac Enzymes No results for input(s): CKMB, TROPONINI,  MYOGLOBIN in the last 168 hours.  Invalid input(s): CK ------------------------------------------------------------------------------------------------------------------ No results found for: BNP  CBG: Recent Labs  Lab 03/22/24 1520 03/22/24 2023 03/22/24 2328 03/23/24 0308 03/23/24 0801  GLUCAP 112* 96 104* 105* 130*    No results found for this or any previous visit (from the past 240 hours).    Radiology Studies: No results found.    Nilda Fendt, MD, PhD [facility]  Between 7 am - 7 pm I am available, please contact me via Amion (for emergencies) or Securechat (non urgent messages)  Between 7 pm - 7 am I am not available, please contact night coverage MD/APP via Amion

## 2024-03-23 NOTE — Progress Notes (Signed)
 Speech Language Pathology Treatment: Dysphagia  Patient Details Name: Colleen [name] MRN: [id number] DOB: 12/11/1950 Today's Date: 03/23/2024 Time: 9061-9051 SLP Time Calculation (min) (ACUTE ONLY): 10 min  Assessment / Plan / Recommendation Clinical Impression  Pt was sleeping but woke up easily. She had no overt coughing or throat clearing today with thin liquids, and could even be challenged to consume consecutive sips (still not equal to three ounces in volume though). She refused solids, but appears to have less overt signs of dysphagia with thin liquids. Per MD, needing to consider whether PEG is indicated or not. Per chart, pt appears to fluctuate a lot, which has the potential to impact safety but also adequate intake. Given clinical signs of potential improvement, would repeat MBS even though it has not been much time, in order to see if she can try to resume POs.    HPI HPI: Colleen [name] is a 73 yo female presenting to ED 8/29 after a fall with headache, neck pain, and nausea. CT shows R scalp/periorbital hematoma, acute SDH along the R cerebral convexity measuring 8 mm thickness with mass effect and 8 mm leftward midline shift as well as an acute SDH along the R aspect of the falx measuring up to 5 mm in thickness, and an epidural hematoma at the craniocervical junction. MRI shows small dorsal epidural hematoma between C1 vertebrae and occiput. Worsening neurostatus 8/31, taken for R craniotomy for subdural hematoma evacuation. PMH includes HTN, asthma, GERD, anxiety/depression, thyroid  garter s/p total thyroidectomy 2013      SLP Plan  MBS          Recommendations  Diet recommendations: NPO;Other(comment) (ice chips after oral care when alert) Medication Administration: Via alternative means                  Oral care QID;Oral care prior to ice chip/H20   Frequent or constant Supervision/Assistance Dysphagia, oropharyngeal phase (R13.12)     MBS     Colleen [name]., M.A. CCC-SLP Acute Rehabilitation Services Office: [phone]  Secure chat preferred   03/23/2024, 9:55 AM

## 2024-03-23 NOTE — Progress Notes (Signed)
 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 Stable on repeat imaging yet patient was exhibiting lethargy, so the pt underwent R craniotomy for SDH evacuation on 8/31. Was doing well postop, but on POD4 had another neurologic decline with slight increase in global cerebral edema. Hypertonic saline started EVD placed for ICP monitoring which was stable for multiple days with waxing/waning mental status. EEG has been checked which was normal. Pt developed a RUE DVT and heparin  gtt was started on 9/7.  LOS: 11 days    Plan: Lethargy episodes: has undergone craniotomy with stable postop scans. Has undergone EVD for 3 days of ICP monitoring with all normal ICP's despite waxing/waning exam. Will plan to monitor neurologically moving forward knowing that she has FND/catatonia episodes. Will be difficult to examine at times. Pupillary changes will be most reliable Goal normonatremia Can expand SBP to <160 Activity as tolerated Diet as tolerated No collar or intervention for CCJ findings RUE DVT - on therapeutic lovenox  Rehab consult placed Suture and staple removal next week Neurosurgery to follow peripherally  Subjective: Nae o/n  Objective: Vital signs in last 24 hours: Temp:  [97.6 F (36.4 C)-98.6 F (37 C)] 98.4 F (36.9 C) (09/12 1158) Pulse Rate:  [69-92] 76 (09/12 1158) Resp:  [14-18] 14 (09/12 1158) BP: (126-155)/(69-83) 128/76 (09/12 1158) SpO2:  [93 %-98 %] 94 % (09/12 1158) Weight:  [73.6 kg] 73.6 kg (09/12 1000)  Intake/Output from previous day: 09/11 0701 - 09/12 0700 In: 1860.1 [NG/GT:1260; IV Piggyback:600.1] Out: 1600 [Urine:1600] Intake/Output this shift: No intake/output data recorded.  Exam: GCS 4E 4V 38M Conversational, slight confused with conversation Conjugate gaze, PERRL Grimace symmetric Commands BUE and BLE full strength Cranial incision c/d/I,  staples EVD incision c/d/I, sutures.  Lab Results: Recent Labs    03/22/24 0539 03/23/24 0219  WBC 7.2 5.8  HGB 8.0* 7.9*  HCT 23.5* 23.5*  PLT 242 279   BMET Recent Labs    03/22/24 0539 03/23/24 0219  NA 132* 134*  K 3.4* 3.1*  CL 97* 101  CO2 23 23  GLUCOSE 109* 108*  BUN 13 19  CREATININE 0.60 0.62  CALCIUM  8.1* 7.9*       Colleen [name] 03/23/2024, 12:26 PM

## 2024-03-24 ENCOUNTER — Inpatient Hospital Stay (HOSPITAL_COMMUNITY)

## 2024-03-24 DIAGNOSIS — S065XAA Traumatic subdural hemorrhage with loss of consciousness status unknown, initial encounter: Secondary | ICD-10-CM | POA: Diagnosis not present

## 2024-03-24 LAB — CBC
HCT: 26.1 % — ABNORMAL LOW (ref 36.0–46.0)
Hemoglobin: 8.7 g/dL — ABNORMAL LOW (ref 12.0–15.0)
MCH: 32.2 pg (ref 26.0–34.0)
MCHC: 33.3 g/dL (ref 30.0–36.0)
MCV: 96.7 fL (ref 80.0–100.0)
Platelets: 297 K/uL (ref 150–400)
RBC: 2.7 MIL/uL — ABNORMAL LOW (ref 3.87–5.11)
RDW: 13.2 % (ref 11.5–15.5)
WBC: 6.7 K/uL (ref 4.0–10.5)
nRBC: 0 % (ref 0.0–0.2)

## 2024-03-24 LAB — GLUCOSE, CAPILLARY
Glucose-Capillary: 102 mg/dL — ABNORMAL HIGH (ref 70–99)
Glucose-Capillary: 116 mg/dL — ABNORMAL HIGH (ref 70–99)
Glucose-Capillary: 119 mg/dL — ABNORMAL HIGH (ref 70–99)
Glucose-Capillary: 138 mg/dL — ABNORMAL HIGH (ref 70–99)
Glucose-Capillary: 150 mg/dL — ABNORMAL HIGH (ref 70–99)
Glucose-Capillary: 95 mg/dL (ref 70–99)

## 2024-03-24 LAB — BASIC METABOLIC PANEL WITH GFR
Anion gap: 7 (ref 5–15)
BUN: 19 mg/dL (ref 8–23)
CO2: 23 mmol/L (ref 22–32)
Calcium: 7.9 mg/dL — ABNORMAL LOW (ref 8.9–10.3)
Chloride: 100 mmol/L (ref 98–111)
Creatinine, Ser: 0.69 mg/dL (ref 0.44–1.00)
GFR, Estimated: >60 mL/min (ref >60)
Glucose, Bld: 121 mg/dL — ABNORMAL HIGH (ref 70–99)
Potassium: 4 mmol/L (ref 3.5–5.1)
Sodium: 130 mmol/L — ABNORMAL LOW (ref 135–145)

## 2024-03-24 LAB — MAGNESIUM: Magnesium: 1.9 mg/dL (ref 1.7–2.4)

## 2024-03-24 MED ORDER — DICYCLOMINE HCL 10 MG PO CAPS
10.0000 mg | ORAL_CAPSULE | Freq: Three times a day (TID) | ORAL | Status: DC
  Administered 2024-03-24 – 2024-03-27 (×12): 10 mg via ORAL
  Filled 2024-03-24 (×14): qty 1

## 2024-03-24 NOTE — Plan of Care (Signed)
   Problem: Education: Goal: Knowledge of General Education information will improve Description: Including pain rating scale, medication(s)/side effects and non-pharmacologic comfort measures Outcome: Progressing   Problem: Clinical Measurements: Goal: Ability to maintain clinical measurements within normal limits will improve Outcome: Progressing   Problem: Clinical Measurements: Goal: Respiratory complications will improve Outcome: Progressing

## 2024-03-24 NOTE — Progress Notes (Signed)
 Pt complains of abd pain, so physician reached. KUB ordered

## 2024-03-24 NOTE — Progress Notes (Signed)
 No new issues or problems.  Patient remains somnolent but will awaken to voice or stimulation.  Continues to have intermittent periods of catatonia.  Wound clean and dry.  Chest and abdomen stable.  Continue supportive efforts.  No new recommendations.

## 2024-03-24 NOTE — Plan of Care (Signed)
  Problem: Health Behavior/Discharge Planning: Goal: Ability to manage health-related needs will improve Outcome: Progressing   Problem: Clinical Measurements: Goal: Will remain free from infection Outcome: Progressing   Problem: Clinical Measurements: Goal: Respiratory complications will improve Outcome: Progressing   Problem: Activity: Goal: Risk for activity intolerance will decrease Outcome: Progressing   Problem: Coping: Goal: Level of anxiety will decrease Outcome: Progressing   Problem: Elimination: Goal: Will not experience complications related to bowel motility Outcome: Progressing   Problem: Pain Managment: Goal: General experience of comfort will improve and/or be controlled Outcome: Progressing   Problem: Safety: Goal: Ability to remain free from injury will improve Outcome: Progressing   Problem: Skin Integrity: Goal: Demonstration of wound healing without infection will improve Outcome: Progressing

## 2024-03-24 NOTE — Progress Notes (Signed)
 PROGRESS NOTE  Colleen [name] FMW:[id number] DOB: 1950-11-06 DOA: 03/09/2024 PCP: Leonel Cole, MD   LOS: 12 days   Brief Narrative / Interim history: 73 year old female with history of HTN, asthma, anxiety, history of catatonia, depression comes into the hospital with headache, neck pain, nausea.  She apparently had a fall at home, in the ER was found to have acute subdural hemorrhage along the right cerebral convexity with mass effect and midline shift.  She was admitted to the neuro ICU status post right craniotomy 8/31 for evacuation and EVD placement on 9/4 with removal on 9/8.  Hospital course complicated by right subclavian and axillary DVT in the setting of a PICC line, now placed on anticoagulation  Subjective / 24h Interval events: The patient is awake, but somewhat lethargic. Family is at bedside. She is complaining of crampy abdominal pain in the periumbilical area. This is particularly worse with eating. KUB of abdomen was obtained and did not demonstrate any acute pathology. There is no abnormal obstructive pattern.  Assesement and Plan: Principal problem Traumatic SDH due to ground-level fall with brain compression -admitted to neuro ICU, status post right craniectomy and evacuation 8/31, EVD placed on 9/4 and removed 9/8.  Management per neurosurgery.  Active problems Right subclavian and right axillary DVT -due to PICC line, now removed.  She is tolerating anticoagulation.  Will switch to oral agents when closer to discharge.SABRA  Dysphagia-likely in the setting of #1.  Discussed with neurosurgery,  it may be up to several weeks before she has some recovery.  Discussed with the patient as well as her daughters, that if she continues to fail swallow eval probably a PEG tube is in order for her to be able to leave the hospital and go to an SNF, as she cannot be discharged with a core track.  Family in agreement for PEG.  The patient was evaluated by SLP on 03/23/2024. The patient  was cleared for a dysphagia 2 diet with thin liquids in a cup or through a straw. Staff was to assist with self-feedings. She is to eat slowly with small bites followed by small sips. Calorie count to follow intake.   Possible aspiration pneumonia-more short of breath on 9/10 in the morning, chest x-ray with concern for pneumonia.  Placed on Unasyn  for about 5 days, today is day 3/5.  She is status post 2 doses of Lasix . The patient has no complaints of dyspnea today.   Hypokalemia-Resolved. Monitor. Magnesium  is normal.   Essential hypertension-The patient is currently normotensive on carvedilol , hydralazine  100 mg q 8 hours, and irbesartan .  Hypothyroidism-continue Synthroid   History of catatonia/anxiety/depression-continue home medications  Anemia-of chronic disease, no bleeding currently, monitor  Disposition-SNF. Pt is now able to take PO nutrition.   Scheduled Meds:  busPIRone   7.5 mg Per Tube BID   carvedilol   12.5 mg Per Tube BID WC   Chlorhexidine  Gluconate Cloth  6 each Topical Daily   dicyclomine   10 mg Oral TID AC & HS   enoxaparin  (LOVENOX ) injection  100 mg Subcutaneous Q24H   feeding supplement  237 mL Oral BID BM   feeding supplement (PROSource TF20)  60 mL Per Tube Daily   hydrALAZINE   100 mg Per Tube Q8H   irbesartan   300 mg Per Tube Daily   lamoTRIgine   150 mg Per Tube Daily   levothyroxine   100 mcg Per Tube Q0600   liver oil-zinc  oxide   Topical BID   mouth rinse  15 mL Mouth Rinse  4 times per day   polyethylene glycol  17 g Per Tube BID   risperiDONE   3 mg Per Tube QHS   senna-docusate  1 tablet Oral BID   sodium chloride  flush  10-40 mL Intracatheter Q12H   traZODone   50 mg Per Tube QHS   venlafaxine   150 mg Per Tube BID WC   Continuous Infusions:  ampicillin -sulbactam (UNASYN ) IV 3 g (03/24/24 0941)   feeding supplement (OSMOLITE 1.5 CAL) 1,000 mL (03/23/24 1807)   PRN Meds:.acetaminophen  (TYLENOL ) oral liquid 160 mg/5 mL, fentaNYL  (SUBLIMAZE )  injection, hydrALAZINE , ipratropium-albuterol , labetalol , metoCLOPramide  (REGLAN ) injection, ondansetron  **OR** ondansetron  (ZOFRAN ) IV, mouth rinse, sodium chloride  flush  Current Outpatient Medications  Medication Instructions   acyclovir  (ZOVIRAX ) 400 mg, Oral, 3 times daily PRN   busPIRone  (BUSPAR ) 7.5 mg, Oral, 2 times daily   lamoTRIgine  (LAMICTAL ) 150 mg, Oral, Daily   levothyroxine  (SYNTHROID ) 100 mcg, Oral, Daily   metoprolol  succinate (TOPROL -XL) 25 mg, Oral, Daily   olmesartan (BENICAR) 20 mg, Daily   risperiDONE  (RISPERDAL ) 3 mg, Daily at bedtime   traZODone  (DESYREL ) 50 mg, Daily at bedtime   venlafaxine  XR (EFFEXOR -XR) 300 mg, Daily    Diet Orders (From admission, onward)     Start     Ordered   03/23/24 1544  DIET DYS 2 Room service appropriate? Yes with Assist; Fluid consistency: Thin  Diet effective now       Comments: Meds in puree  Question Answer Comment  Room service appropriate? Yes with Assist   Fluid consistency: Thin      03/23/24 1544            DVT prophylaxis: SCDs Start: 03/12/24 0225   Lab Results  Component Value Date   PLT 297 03/24/2024      Code Status: Full Code  Family Communication: No family at bedside  Status is: Inpatient Remains inpatient appropriate because: Severity of illness  Level of care: Progressive   Objective: Vitals:   03/24/24 0406 03/24/24 0500 03/24/24 0747 03/24/24 1122  BP: (!) 147/75  135/73 134/80  Pulse: 99  100 90  Resp: 18  18 18   Temp: 98.4 F (36.9 C)  98.1 F (36.7 C) 97.9 F (36.6 C)  TempSrc: Oral  Oral   SpO2: 95%  95% 95%  Weight:  72 kg    Height:        Intake/Output Summary (Last 24 hours) at 03/24/2024 1523 Last data filed at 03/23/2024 2315 Gross per 24 hour  Intake 100 ml  Output 300 ml  Net -200 ml   Wt Readings from Last 3 Encounters:  03/24/24 72 kg  10/10/21 65.3 kg  10/10/21 65.3 kg    Examination:  Exam:  Constitutional:  The patient is awake, alert, and  oriented x 3. No acute distress. Respiratory:  No increased work of breathing. No wheezes, rales, or rhonchi No tactile fremitus Cardiovascular:  Regular rate and rhythm No murmurs, ectopy, or gallups. No lateral PMI. No thrills. Abdomen:  Abdomen is soft, mildly tender in the periumbilical area, and somewhat distended. No hernias, masses, or organomegaly Normoactive bowel sounds.  Musculoskeletal:  No cyanosis, clubbing, or edema Skin:  No rashes, lesions, ulcers palpation of skin: no induration or nodules Neurologic:  CN 2-12 intact Sensation all 4 extremities intact Psychiatric:  Mental status Mood, affect appropriate Orientation to person, place, time  judgment and insight appear intact    Data Reviewed: I have independently reviewed following labs and imaging studies   CBC  Recent Labs  Lab 03/20/24 0528 03/21/24 0542 03/22/24 0539 03/23/24 0219 03/24/24 0220  WBC 7.5 6.4 7.2 5.8 6.7  HGB 7.6* 7.4* 8.0* 7.9* 8.7*  HCT 23.0* 22.3* 23.5* 23.5* 26.1*  PLT 234 237 242 279 297  MCV 100.0 97.4 95.5 95.5 96.7  MCH 33.0 32.3 32.5 32.1 32.2  MCHC 33.0 33.2 34.0 33.6 33.3  RDW 13.2 13.0 12.7 12.7 13.2    Recent Labs  Lab 03/18/24 0517 03/18/24 1348 03/19/24 0457 03/19/24 1304 03/20/24 0528 03/21/24 0542 03/22/24 0539 03/23/24 0219 03/24/24 0220  NA 155*  155*   < > 147*   < > 137 136 132* 134* 130*  K 3.7  --  3.6   < > 3.4* 3.9 3.4* 3.1* 4.0  CL 127*  --  115*   < > 104 102 97* 101 100  CO2 21*  --  21*   < > 22 25 23 23 23   GLUCOSE 130*  --  121*   < > 133* 104* 109* 108* 121*  BUN 17  --  20   < > 23 22 13 19 19   CREATININE 0.67  --  0.78   < > 0.65 0.59 0.60 0.62 0.69  CALCIUM  8.1*  --  8.1*   < > 7.9* 8.0* 8.1* 7.9* 7.9*  AST  --   --   --   --   --   --  17 19  --   ALT  --   --   --   --   --   --  24 22  --   ALKPHOS  --   --   --   --   --   --  102 92  --   BILITOT  --   --   --   --   --   --  0.6 0.4  --   ALBUMIN  --   --   --   --   --    --  2.7* 2.5*  --   MG 2.0  --  1.9  --   --   --  1.9 1.8 1.9  TSH  --   --  1.205  --   --   --   --   --   --    < > = values in this interval not displayed.    ------------------------------------------------------------------------------------------------------------------ No results for input(s): CHOL, HDL, LDLCALC, TRIG, CHOLHDL, LDLDIRECT in the last 72 hours.  Lab Results  Component Value Date   HGBA1C 5.1 10/17/2021   ------------------------------------------------------------------------------------------------------------------ No results for input(s): TSH, T4TOTAL, T3FREE, THYROIDAB in the last 72 hours.  Invalid input(s): FREET3   Cardiac Enzymes No results for input(s): CKMB, TROPONINI, MYOGLOBIN in the last 168 hours.  Invalid input(s): CK ------------------------------------------------------------------------------------------------------------------ No results found for: BNP  CBG: Recent Labs  Lab 03/23/24 2038 03/23/24 2314 03/24/24 0413 03/24/24 0749 03/24/24 1123  GLUCAP 119* 116* 138* 119* 116*    No results found for this or any previous visit (from the past 240 hours).    Radiology Studies: DG Abd 1 View Result Date: 03/24/2024 EXAM: 1 VIEW XRAY OF THE ABDOMEN 03/24/2024 08:37:00 AM COMPARISON: None available. CLINICAL HISTORY: Acute periumbilical pain FINDINGS: LINES, TUBES AND DEVICES: Weighted tip enteric tube in place with tip in gastric antrum. BOWEL: Contrast is present in the terminal ileum and throughout the colon. Bowel gas pattern is normal. SOFT TISSUES: No opaque urinary calculi. BONES: No acute osseous abnormality.  IMPRESSION: 1. No bowel obstruction. Electronically signed by: Lonni Necessary MD 03/24/2024 12:27 PM EDT RP Workstation: [id number]   Braylan Faul, DO [facility] 03/24/2024 15:38  Between 7 am - 7 pm I am available, please contact me via Amion (for emergencies) or Securechat  (non urgent messages)  Between 7 pm - 7 am I am not available, please contact night coverage MD/APP via Amion

## 2024-03-25 DIAGNOSIS — S065XAA Traumatic subdural hemorrhage with loss of consciousness status unknown, initial encounter: Secondary | ICD-10-CM | POA: Diagnosis not present

## 2024-03-25 LAB — CBC WITH DIFFERENTIAL/PLATELET
Abs Immature Granulocytes: 0.11 K/uL — ABNORMAL HIGH (ref 0.00–0.07)
Basophils Absolute: 0 K/uL (ref 0.0–0.1)
Basophils Relative: 0 %
Eosinophils Absolute: 0 K/uL (ref 0.0–0.5)
Eosinophils Relative: 0 %
HCT: 28.4 % — ABNORMAL LOW (ref 36.0–46.0)
Hemoglobin: 9.5 g/dL — ABNORMAL LOW (ref 12.0–15.0)
Immature Granulocytes: 1 %
Lymphocytes Relative: 4 %
Lymphs Abs: 0.7 K/uL (ref 0.7–4.0)
MCH: 32.9 pg (ref 26.0–34.0)
MCHC: 33.5 g/dL (ref 30.0–36.0)
MCV: 98.3 fL (ref 80.0–100.0)
Monocytes Absolute: 1.4 K/uL — ABNORMAL HIGH (ref 0.1–1.0)
Monocytes Relative: 8 %
Neutro Abs: 14.7 K/uL — ABNORMAL HIGH (ref 1.7–7.7)
Neutrophils Relative %: 87 %
Platelets: 337 K/uL (ref 150–400)
RBC: 2.89 MIL/uL — ABNORMAL LOW (ref 3.87–5.11)
RDW: 13.5 % (ref 11.5–15.5)
WBC: 17 K/uL — ABNORMAL HIGH (ref 4.0–10.5)
nRBC: 0 % (ref 0.0–0.2)

## 2024-03-25 LAB — COMPREHENSIVE METABOLIC PANEL WITH GFR
ALT: 19 U/L (ref 0–44)
AST: 20 U/L (ref 15–41)
Albumin: 2.6 g/dL — ABNORMAL LOW (ref 3.5–5.0)
Alkaline Phosphatase: 113 U/L (ref 38–126)
Anion gap: 10 (ref 5–15)
BUN: 20 mg/dL (ref 8–23)
CO2: 20 mmol/L — ABNORMAL LOW (ref 22–32)
Calcium: 8 mg/dL — ABNORMAL LOW (ref 8.9–10.3)
Chloride: 98 mmol/L (ref 98–111)
Creatinine, Ser: 0.67 mg/dL (ref 0.44–1.00)
GFR, Estimated: >60 mL/min (ref >60)
Glucose, Bld: 152 mg/dL — ABNORMAL HIGH (ref 70–99)
Potassium: 3.9 mmol/L (ref 3.5–5.1)
Sodium: 128 mmol/L — ABNORMAL LOW (ref 135–145)
Total Bilirubin: <0.2 mg/dL (ref 0.0–1.2)
Total Protein: 5.9 g/dL — ABNORMAL LOW (ref 6.5–8.1)

## 2024-03-25 LAB — GLUCOSE, CAPILLARY
Glucose-Capillary: 130 mg/dL — ABNORMAL HIGH (ref 70–99)
Glucose-Capillary: 162 mg/dL — ABNORMAL HIGH (ref 70–99)
Glucose-Capillary: 117 mg/dL — ABNORMAL HIGH (ref 70–99)
Glucose-Capillary: 109 mg/dL — ABNORMAL HIGH (ref 70–99)
Glucose-Capillary: 96 mg/dL (ref 70–99)

## 2024-03-25 NOTE — Progress Notes (Signed)
 No new issues or problems.  Patient remains somnolent with intermittent awakening.  No new focal neurologic signs.  Pupillary examination stable.  Wound clean and dry.  Patient is status post evacuation of subdural hematoma.  Patient with functional intermittent catatonia of unclear etiology.  At this point she is stable from our standpoint.  No new recommendations.

## 2024-03-25 NOTE — Progress Notes (Signed)
 PROGRESS NOTE  Colleen [name] FMW:[id number] DOB: 01-07-1951 DOA: 03/09/2024 PCP: Leonel Cole, MD   LOS: 13 days   Brief Narrative / Interim history: 73 year old female with history of HTN, asthma, anxiety, history of catatonia, depression comes into the hospital with headache, neck pain, nausea.  She apparently had a fall at home, in the ER was found to have acute subdural hemorrhage along the right cerebral convexity with mass effect and midline shift.  She was admitted to the neuro ICU status post right craniotomy 8/31 for evacuation and EVD placement on 9/4 with removal on 9/8.  Hospital course complicated by right subclavian and axillary DVT in the setting of a PICC line, now placed on anticoagulation.  Subjective / 24h Interval events: X-ray of abdomen demonstrates no cause for patient's discomfort. Her last BM was 03/24/2024. AWait results of calorie count.  Assesement and Plan: Principal problem Traumatic SDH due to ground-level fall with brain compression -admitted to neuro ICU, status post right craniectomy and evacuation 8/31, EVD placed on 9/4 and removed 9/8.  Management per neurosurgery.  Active problems Right subclavian and right axillary DVT -due to PICC line, now removed.  She is tolerating anticoagulation.  Will switch to oral agents when closer to discharge.SABRA  Dysphagia-likely in the setting of #1.  Discussed with neurosurgery,  it may be up to several weeks before she has some recovery.  Discussed with the patient as well as her daughters, that if she continues to fail swallow eval probably a PEG tube is in order for her to be able to leave the hospital and go to an SNF, as she cannot be discharged with a core track.  Family in agreement for PEG.  The patient was evaluated by SLP on 03/23/2024. The patient was cleared for a dysphagia 2 diet with thin liquids in a cup or through a straw. Staff was to assist with self-feedings. She is to eat slowly with small bites followed by  small sips. Calorie count to follow intake. Await results of calorie count.  Possible aspiration pneumonia-more short of breath on 9/10 in the morning, chest x-ray with concern for pneumonia.  Placed on Unasyn  for about 5 days, today is day 3/5.  She is status post 2 doses of Lasix . The patient has no complaints of dyspnea today.   Hypokalemia-Resolved. Monitor. Magnesium  is normal.   Essential hypertension-The patient is currently normotensive on carvedilol , hydralazine  100 mg q 8 hours, and irbesartan .  Hypothyroidism-continue Synthroid   History of catatonia/anxiety/depression-continue home medications  Anemia-of chronic disease, no bleeding currently, monitor  Disposition-SNF. Pt is now able to take PO nutrition.   Scheduled Meds:  busPIRone   7.5 mg Per Tube BID   carvedilol   12.5 mg Per Tube BID WC   Chlorhexidine  Gluconate Cloth  6 each Topical Daily   dicyclomine   10 mg Oral TID AC & HS   enoxaparin  (LOVENOX ) injection  100 mg Subcutaneous Q24H   feeding supplement  237 mL Oral BID BM   feeding supplement (PROSource TF20)  60 mL Per Tube Daily   hydrALAZINE   100 mg Per Tube Q8H   irbesartan   300 mg Per Tube Daily   lamoTRIgine   150 mg Per Tube Daily   levothyroxine   100 mcg Per Tube Q0600   liver oil-zinc  oxide   Topical BID   mouth rinse  15 mL Mouth Rinse 4 times per day   polyethylene glycol  17 g Per Tube BID   risperiDONE   3 mg Per Tube QHS  senna-docusate  1 tablet Oral BID   sodium chloride  flush  10-40 mL Intracatheter Q12H   traZODone   50 mg Per Tube QHS   venlafaxine   150 mg Per Tube BID WC   Continuous Infusions:  ampicillin -sulbactam (UNASYN ) IV 3 g (03/25/24 0929)   feeding supplement (OSMOLITE 1.5 CAL) 1,000 mL (03/24/24 1934)   PRN Meds:.acetaminophen  (TYLENOL ) oral liquid 160 mg/5 mL, fentaNYL  (SUBLIMAZE ) injection, hydrALAZINE , ipratropium-albuterol , labetalol , metoCLOPramide  (REGLAN ) injection, ondansetron  **OR** ondansetron  (ZOFRAN ) IV, mouth rinse,  sodium chloride  flush  Current Outpatient Medications  Medication Instructions   acyclovir  (ZOVIRAX ) 400 mg, Oral, 3 times daily PRN   busPIRone  (BUSPAR ) 7.5 mg, Oral, 2 times daily   lamoTRIgine  (LAMICTAL ) 150 mg, Oral, Daily   levothyroxine  (SYNTHROID ) 100 mcg, Oral, Daily   metoprolol  succinate (TOPROL -XL) 25 mg, Oral, Daily   olmesartan (BENICAR) 20 mg, Daily   risperiDONE  (RISPERDAL ) 3 mg, Daily at bedtime   traZODone  (DESYREL ) 50 mg, Daily at bedtime   venlafaxine  XR (EFFEXOR -XR) 300 mg, Daily    Diet Orders (From admission, onward)     Start     Ordered   03/23/24 1544  DIET DYS 2 Room service appropriate? Yes with Assist; Fluid consistency: Thin  Diet effective now       Comments: Meds in puree  Question Answer Comment  Room service appropriate? Yes with Assist   Fluid consistency: Thin      03/23/24 1544            DVT prophylaxis: SCDs Start: 03/12/24 0225   Lab Results  Component Value Date   PLT 337 03/25/2024      Code Status: Full Code  Family Communication: No family at bedside  Status is: Inpatient Remains inpatient appropriate because: Severity of illness  Level of care: Progressive   Objective: Vitals:   03/25/24 0319 03/25/24 0500 03/25/24 0752 03/25/24 1216  BP: (!) 141/71  123/69 115/60  Pulse: 100  92 78  Resp: 18  19 18   Temp: 98.9 F (37.2 C)  99.1 F (37.3 C) 99.7 F (37.6 C)  TempSrc: Oral  Oral Oral  SpO2: 94%  97% 94%  Weight:  71.8 kg    Height:        Intake/Output Summary (Last 24 hours) at 03/25/2024 1447 Last data filed at 03/25/2024 1300 Gross per 24 hour  Intake 820 ml  Output 200 ml  Net 620 ml   Wt Readings from Last 3 Encounters:  03/25/24 71.8 kg  10/10/21 65.3 kg  10/10/21 65.3 kg    Examination:  Exam:  Constitutional:  The patient is awake, alert, and oriented x 3. No acute distress. Respiratory:  No increased work of breathing. No wheezes, rales, or rhonchi No tactile  fremitus Cardiovascular:  Regular rate and rhythm No murmurs, ectopy, or gallups. No lateral PMI. No thrills. Abdomen:  Abdomen is soft, mildly tender in the periumbilical area, and somewhat distended. No hernias, masses, or organomegaly Normoactive bowel sounds.  Musculoskeletal:  No cyanosis, clubbing, or edema Skin:  No rashes, lesions, ulcers palpation of skin: no induration or nodules Neurologic:  CN 2-12 intact Sensation all 4 extremities intact Psychiatric:  Mental status Mood, affect appropriate Orientation to person, place, time  judgment and insight appear intact  Data Reviewed: I have independently reviewed following labs and imaging studies   CBC Recent Labs  Lab 03/21/24 0542 03/22/24 0539 03/23/24 0219 03/24/24 0220 03/25/24 0557  WBC 6.4 7.2 5.8 6.7 17.0*  HGB 7.4* 8.0* 7.9* 8.7*  9.5*  HCT 22.3* 23.5* 23.5* 26.1* 28.4*  PLT 237 242 279 297 337  MCV 97.4 95.5 95.5 96.7 98.3  MCH 32.3 32.5 32.1 32.2 32.9  MCHC 33.2 34.0 33.6 33.3 33.5  RDW 13.0 12.7 12.7 13.2 13.5  LYMPHSABS  --   --   --   --  0.7  MONOABS  --   --   --   --  1.4*  EOSABS  --   --   --   --  0.0  BASOSABS  --   --   --   --  0.0    Recent Labs  Lab 03/19/24 0457 03/19/24 1304 03/21/24 0542 03/22/24 0539 03/23/24 0219 03/24/24 0220 03/25/24 0557  NA 147*   < > 136 132* 134* 130* 128*  K 3.6   < > 3.9 3.4* 3.1* 4.0 3.9  CL 115*   < > 102 97* 101 100 98  CO2 21*   < > 25 23 23 23  20*  GLUCOSE 121*   < > 104* 109* 108* 121* 152*  BUN 20   < > 22 13 19 19 20   CREATININE 0.78   < > 0.59 0.60 0.62 0.69 0.67  CALCIUM  8.1*   < > 8.0* 8.1* 7.9* 7.9* 8.0*  AST  --   --   --  17 19  --  20  ALT  --   --   --  24 22  --  19  ALKPHOS  --   --   --  102 92  --  113  BILITOT  --   --   --  0.6 0.4  --  <0.2  ALBUMIN  --   --   --  2.7* 2.5*  --  2.6*  MG 1.9  --   --  1.9 1.8 1.9  --   TSH 1.205  --   --   --   --   --   --    < > = values in this interval not displayed.     ------------------------------------------------------------------------------------------------------------------ No results for input(s): CHOL, HDL, LDLCALC, TRIG, CHOLHDL, LDLDIRECT in the last 72 hours.  Lab Results  Component Value Date   HGBA1C 5.1 10/17/2021   ------------------------------------------------------------------------------------------------------------------ No results for input(s): TSH, T4TOTAL, T3FREE, THYROIDAB in the last 72 hours.  Invalid input(s): FREET3   Cardiac Enzymes No results for input(s): CKMB, TROPONINI, MYOGLOBIN in the last 168 hours.  Invalid input(s): CK ------------------------------------------------------------------------------------------------------------------ No results found for: BNP  CBG: Recent Labs  Lab 03/24/24 1925 03/24/24 2347 03/25/24 0319 03/25/24 0752 03/25/24 1215  GLUCAP 95 150* 130* 162* 117*    No results found for this or any previous visit (from the past 240 hours).    Radiology Studies: No results found.  Jalyiah Shelley, DO [facility] 03/25/2024 14:56  Between 7 am - 7 pm I am available, please contact me via Amion (for emergencies) or Securechat (non urgent messages)  Between 7 pm - 7 am I am not available, please contact night coverage MD/APP via Amion

## 2024-03-25 NOTE — Plan of Care (Signed)
 Pt is flat affect upon received though responded on assessment, and she is more alert in early morning. Problem: Education: Goal: Knowledge of General Education information will improve Description: Including pain rating scale, medication(s)/side effects and non-pharmacologic comfort measures Outcome: Progressing   Problem: Health Behavior/Discharge Planning: Goal: Ability to manage health-related needs will improve Outcome: Progressing   Problem: Clinical Measurements: Goal: Will remain free from infection Outcome: Progressing   Problem: Clinical Measurements: Goal: Diagnostic test results will improve Outcome: Progressing   Problem: Activity: Goal: Risk for activity intolerance will decrease Outcome: Progressing   Problem: Nutrition: Goal: Adequate nutrition will be maintained Outcome: Progressing   Problem: Safety: Goal: Ability to remain free from injury will improve Outcome: Progressing   Problem: Skin Integrity: Goal: Risk for impaired skin integrity will decrease Outcome: Progressing

## 2024-03-25 NOTE — Progress Notes (Addendum)
 Per patient to hold nocturnal TF tonight to see if she has better appetite in the AM. Please see doc flow sheet for accurate intake and output. Patient's daughter requested to take the patient outside via wheelchair if possible. Order received from attending MD Dr Soledad.

## 2024-03-26 ENCOUNTER — Inpatient Hospital Stay (HOSPITAL_COMMUNITY)

## 2024-03-26 DIAGNOSIS — S065XAA Traumatic subdural hemorrhage with loss of consciousness status unknown, initial encounter: Secondary | ICD-10-CM | POA: Diagnosis not present

## 2024-03-26 LAB — GLUCOSE, CAPILLARY
Glucose-Capillary: 108 mg/dL — ABNORMAL HIGH (ref 70–99)
Glucose-Capillary: 109 mg/dL — ABNORMAL HIGH (ref 70–99)
Glucose-Capillary: 71 mg/dL (ref 70–99)
Glucose-Capillary: 85 mg/dL (ref 70–99)
Glucose-Capillary: 86 mg/dL (ref 70–99)
Glucose-Capillary: 88 mg/dL (ref 70–99)
Glucose-Capillary: 94 mg/dL (ref 70–99)

## 2024-03-26 LAB — BASIC METABOLIC PANEL WITH GFR
Anion gap: 9 (ref 5–15)
BUN: 16 mg/dL (ref 8–23)
CO2: 24 mmol/L (ref 22–32)
Calcium: 8.1 mg/dL — ABNORMAL LOW (ref 8.9–10.3)
Chloride: 95 mmol/L — ABNORMAL LOW (ref 98–111)
Creatinine, Ser: 0.82 mg/dL (ref 0.44–1.00)
GFR, Estimated: >60 mL/min (ref >60)
Glucose, Bld: 94 mg/dL (ref 70–99)
Potassium: 3.8 mmol/L (ref 3.5–5.1)
Sodium: 128 mmol/L — ABNORMAL LOW (ref 135–145)

## 2024-03-26 LAB — CBC WITH DIFFERENTIAL/PLATELET
Abs Immature Granulocytes: 0.14 K/uL — ABNORMAL HIGH (ref 0.00–0.07)
Basophils Absolute: 0 K/uL (ref 0.0–0.1)
Basophils Relative: 0 %
Eosinophils Absolute: 0.1 K/uL (ref 0.0–0.5)
Eosinophils Relative: 1 %
HCT: 23.6 % — ABNORMAL LOW (ref 36.0–46.0)
Hemoglobin: 8 g/dL — ABNORMAL LOW (ref 12.0–15.0)
Immature Granulocytes: 1 %
Lymphocytes Relative: 6 %
Lymphs Abs: 0.8 K/uL (ref 0.7–4.0)
MCH: 32.4 pg (ref 26.0–34.0)
MCHC: 33.9 g/dL (ref 30.0–36.0)
MCV: 95.5 fL (ref 80.0–100.0)
Monocytes Absolute: 1.1 K/uL — ABNORMAL HIGH (ref 0.1–1.0)
Monocytes Relative: 8 %
Neutro Abs: 12.6 K/uL — ABNORMAL HIGH (ref 1.7–7.7)
Neutrophils Relative %: 84 %
Platelets: 310 K/uL (ref 150–400)
RBC: 2.47 MIL/uL — ABNORMAL LOW (ref 3.87–5.11)
RDW: 13.4 % (ref 11.5–15.5)
WBC: 14.8 K/uL — ABNORMAL HIGH (ref 4.0–10.5)
nRBC: 0 % (ref 0.0–0.2)

## 2024-03-26 MED ORDER — FENTANYL CITRATE PF 50 MCG/ML IJ SOSY: 12.5000 ug | PREFILLED_SYRINGE | Freq: Three times a day (TID) | INTRAMUSCULAR | Status: DC | PRN

## 2024-03-26 MED ORDER — GERHARDT'S BUTT CREAM
TOPICAL_CREAM | Freq: Two times a day (BID) | CUTANEOUS | Status: DC
  Administered 2024-03-27: 1 via TOPICAL
  Filled 2024-03-26: qty 60

## 2024-03-26 MED ORDER — DEXTROSE 50 % IV SOLN: 1.0000 | INTRAVENOUS | Status: DC | PRN

## 2024-03-26 NOTE — TOC Progression Note (Signed)
 Transition of Care [facility]) - Progression Note    Patient Details  Name: Colleen [name] MRN: [id number] Date of Birth: [birth year]  Transition of Care [facility]) CM/SW Contact  [name], [place] Phone Number: 03/26/2024, 12:02 PM  Clinical Narrative:   CSW following for disposition. Awaiting improvement in patient's nutrition status to send out referrals for SNF.     Expected Discharge Plan: Skilled Nursing Facility Barriers to Discharge: Continued Medical Work up, SNF Pending bed offer               Expected Discharge Plan and Services In-house Referral: Clinical Social Work Discharge Planning Services: CM Consult Post Acute Care Choice: Skilled Nursing Facility Living arrangements for the past 2 months: Single Family Home                                       Social Drivers of Health (SDOH) Interventions SDOH Screenings   Food Insecurity: No Food Insecurity (03/09/2024)  Housing: Low Risk  (03/09/2024)  Transportation Needs: No Transportation Needs (03/09/2024)  Utilities: Not At Risk (03/09/2024)  Alcohol Screen: Low Risk  (10/10/2021)  Depression (PHQ2-9): High Risk (10/09/2021)  Social Connections: Patient Declined (03/09/2024)  Tobacco Use: Medium Risk (03/09/2024)    Readmission Risk Interventions     No data to display

## 2024-03-26 NOTE — Care Management Important Message (Signed)
 Important Message  Patient Details  Name: Colleen [name] MRN: [id number] Date of Birth: Oct 21, 1950   Important Message Given:  Yes - Medicare IM     Claretta Deed 03/26/2024, 3:49 PM

## 2024-03-26 NOTE — Progress Notes (Signed)
 Physical Therapy Treatment Patient Details Name: Colleen [name] MRN: [id number] DOB: 1950-12-20 Today's Date: 03/26/2024   History of Present Illness Colleen [name] is a 73 yo female presenting to ED 8/29 after a fall with headache, neck pain, and nausea. CT shows R scalp/periorbital hematoma, acute SDH along the R cerebral convexity measuring 8 mm thickness with mass effect and 8 mm leftward midline shift as well as an acute SDH along the R aspect of the falx measuring up to 5 mm in thickness, and an epidural hematoma at the craniocervical junction. MRI shows small dorsal epidural hematoma between C1 vertebrae and occiput. Worsening neurostatus 8/31, taken for R craniotomy for subdural hematoma evacuation. s/p EVD placement 9/4, removed 9/8. New dx LUE DVT 9/7 started on heparin  9/7, MD okayed mobility as of 9/8. PMH includes HTN, asthma, GERD, anxiety/depression, thyroid  garter s/p total thyroidectomy 2013    PT Comments  Pt progressing steadily towards her physical therapy goals. Pt is alert and agreeable to participate with therapy. Able to verbalize need to urinate upon sitting edge of bed and transferred onto bedside commode. Ambulated limited hallway distance with RW and chair follow. Displays right lateral lean and continued left inattention. Recommend post acute rehab to address balance, strengthening, cognitive remediation and functional mobility.     If plan is discharge home, recommend the following: Help with stairs or ramp for entrance;Assist for transportation;Supervision due to cognitive status;A lot of help with walking and/or transfers;A lot of help with bathing/dressing/bathroom   Can travel by private vehicle     Yes  Equipment Recommendations  BSC/3in1;Rolling walker (2 wheels)    Recommendations for Other Services       Precautions / Restrictions Precautions Precautions: Fall Recall of Precautions/Restrictions: Impaired Precaution/Restrictions Comments: SBP <  160 Restrictions Weight Bearing Restrictions Per Provider Order: No     Mobility  Bed Mobility Overal bed mobility: Needs Assistance Bed Mobility: Supine to Sit     Supine to sit: Supervision          Transfers Overall transfer level: Needs assistance Equipment used: None, Rolling walker (2 wheels) Transfers: Sit to/from Stand, Bed to chair/wheelchair/BSC Sit to Stand: Contact guard assist Stand pivot transfers: Contact guard assist         General transfer comment: Close CGA for stand pivot to [facility]    Ambulation/Gait Ambulation/Gait assistance: Min assist, Mod assist, +2 safety/equipment Gait Distance (Feet): 60 Feet (60, 60) Assistive device: Rolling walker (2 wheels) Gait Pattern/deviations: Step-through pattern, Decreased stride length, Trunk flexed, Drifts right/left Gait velocity: decreased Gait velocity interpretation: <1.8 ft/sec, indicate of risk for recurrent falls   General Gait Details: Pt with left drift and right lateral lean, requires intermittent steering assist for walker. Verbal cues for larger steps and for aiming for diamonds, in hall as visual aid for maintaining a straight path.   Stairs             Wheelchair Mobility     Tilt Bed    Modified Rankin (Stroke Patients Only)       Balance Overall balance assessment: Needs assistance Sitting-balance support: Feet supported Sitting balance-Leahy Scale: Fair     Standing balance support: During functional activity, No upper extremity supported Standing balance-Leahy Scale: Fair                              Communication Communication Communication: No apparent difficulties Factors Affecting Communication: Difficulty expressing self  Cognition  Arousal: Alert Behavior During Therapy: Flat affect   PT - Cognitive impairments: Safety/Judgement, Problem solving, Sequencing                         Following commands: Impaired Following commands  impaired: Follows one step commands with increased time    Cueing Cueing Techniques: Verbal cues, Gestural cues, Tactile cues  Exercises      General Comments        Pertinent Vitals/Pain Pain Assessment Pain Assessment: Faces Faces Pain Scale: No hurt    Home Living                          Prior Function            PT Goals (current goals can now be found in the care plan section) Acute Rehab PT Goals Patient Stated Goal: did not state Time For Goal Achievement: 04/09/24 Potential to Achieve Goals: Good Progress towards PT goals: Progressing toward goals    Frequency    Min 2X/week      PT Plan      Co-evaluation              AM-PAC PT 6 Clicks Mobility   Outcome Measure  Help needed turning from your back to your side while in a flat bed without using bedrails?: A Little Help needed moving from lying on your back to sitting on the side of a flat bed without using bedrails?: A Little Help needed moving to and from a bed to a chair (including a wheelchair)?: A Little Help needed standing up from a chair using your arms (e.g., wheelchair or bedside chair)?: A Little Help needed to walk in hospital room?: A Lot Help needed climbing 3-5 steps with a railing? : A Lot 6 Click Score: 16    End of Session Equipment Utilized During Treatment: Gait belt Activity Tolerance: Patient tolerated treatment well Patient left: in chair;with call bell/phone within reach;with chair alarm set Nurse Communication: Mobility status PT Visit Diagnosis: Unsteadiness on feet (R26.81);Difficulty in walking, not elsewhere classified (R26.2)     Time: 8895-8870 PT Time Calculation (min) (ACUTE ONLY): 25 min  Charges:    $Therapeutic Activity: 23-37 mins PT General Charges $$ ACUTE PT VISIT: 1 Visit                     Aleck Daring, PT, DPT Acute Rehabilitation Services Office [phone]    [name] 03/26/2024, 1:32 PM

## 2024-03-26 NOTE — Progress Notes (Signed)
 TR[facility]   ROUNDING   NOTE Colleen [name] FMW:[id number]  DOB: May 11, 1951  DOA: 03/09/2024  PCP: Leonel Cole, MD  03/26/2024,9:57 AM  LOS: 14 days    Code Status: Home     from: Home   73 year old white female Anxiety catatonia lethargy HTN asthma Admitted with fall traumatic SDH and ensuant issues as below  Significant Hospital Events: Including procedures, antibiotic start and stop dates in addition to other pertinent events   8/29 admit by neurosurg for right SDH and Cervical Spine Hematoma  8/31 right craniectomy 9/2: Patient became confused over the evening.  CT head showed worsening SDH.  Repeat CT head with stable SDH.  Discussed with neurosurgery, expected postop changes.  No surgical interventions.  EEG negative. 9/3: all drains are out.  9/4: Worsening mentation.  Stat CT head showed slight worsening SDH [9 mm] with persistent midline shift to left, increased cerebral edema with crowding of basilar cisterns increased effacement of fourth ventricle and partial effacement of prepontine cistern and inferior descent of cerebral tonsils.  Status post EVD placement.  Only transdusing. 9/7 RUE DVT; hep gtt no bolus  9/8 repeat CT H unchanged  9/9 dc foley, try to get PIV to dc PICC  9/10 transferred to hospitalist service      Assessment  & Plan :    Traumatic SDH ground-level fall brain compression Right craniectomy/EVD placed 9 4 removed 9/8 Deferring to neurosurgery who are following intermittently as no further neurosurgical needs Continues Lamictal  150 daily Stop fentanyl  unless really needed and/or spaced out to every 8 as needed Foley catheter present and placed on 9/6 If able try to discontinue otherwise may require this going forward Voiding trial as best able Leukocytosis over the past 2 days Etiology unclear-watch for fever curve Get chest x-ray-- given risk of aspiration Right subclavian axillary DVT due to PICC line Continues on Lovenox  therapeutic dosing and needs  DOAC at discharge once PEG tube is placed Treated for aspiration pneumonia Dysphagia secondary to brain damage Dysphagia 2 diet-previous MBS showed high risk of aspiration repeat 1 done on 9/12 allowed her to have current diet Completed Unasyn  9/10 through 9/14 SLP to continue to revisit on dysphagia 2 diet Calorie count started on 9/12 await results of the same Is anxious to not have PEG--will discuss with Calorie count result with family Continues Bentyl  10 3 times daily meals Reglan  5 every 6 as needed nausea Mild hyponatremia Baseline seems to be above 140 She has progressively become hyponatremic probably from excessive free water since 9/11 Fluid restrict 1500 cc stop all free water and if no better would then get urine studies HTN Stable at this time and controlled on Coreg  12.5  twice daily irbesartan  300 with PRNs of labetalol  and hydralazine  PTA sacral decubitus Gerhardt Butt cream review wounds a.m. Catatonic anxiety Continues BuSpar  7.5 twice daily Risperdal  3 mg at bedtime trazodone  50 at bedtime Effexor  150 twice daily meals Hypothyroidism Continue Synthroid  100 mcg daily Resolved hypokalemia   Data Reviewed:   WBC 14.8 hemoglobin 8.0 platelet 310 Sodium 128 potassium 3.8 BUN/creatinine 16/0.8  DVT prophylaxis:  Lovenox   Status is: Inpatient Remains inpatient appropriate because:   Requires further discussions     Current Dispo: Hospital     Subjective:    Alert oriented not confused cannot tell me date time year knows that she is from [place]  has 2 daughters Eating breakfast No pain no fever Several loose stools   Objective + exam  Vitals:   03/26/24 0003 03/26/24 0502 03/26/24 0509 03/26/24 0811  BP: 123/67 130/87 130/87 128/65  Pulse: 79 82  78  Resp: 18 18  20   Temp: 98.5 F (36.9 C) 98.4 F (36.9 C)  98.6 F (37 C)  TempSrc: Oral Oral  Oral  SpO2: 93% 98%  96%  Weight:      Height:       Filed Weights   03/23/24  1301 03/24/24 0500 03/25/24 0500  Weight: 70.3 kg 72 kg 71.8 kg     Examination: EOMI NCAT no focal deficit core track in place postop changes to head pupils equally reactive neck soft supple S1-S2 no murmur Abdomen soft no rebound No lower extremity edema follows my finger tracks smoothly Power 5/5 raises both legs sensory intact to lower extremity dermatomes to cool touch Reflexes deferred Affect slightly flat but interactive     Scheduled Meds:  busPIRone   7.5 mg Per Tube BID   carvedilol   12.5 mg Per Tube BID WC   Chlorhexidine  Gluconate Cloth  6 each Topical Daily   dicyclomine   10 mg Oral TID AC & HS   enoxaparin  (LOVENOX ) injection  100 mg Subcutaneous Q24H   feeding supplement  237 mL Oral BID BM   feeding supplement (PROSource TF20)  60 mL Per Tube Daily   hydrALAZINE   100 mg Per Tube Q8H   irbesartan   300 mg Per Tube Daily   lamoTRIgine   150 mg Per Tube Daily   levothyroxine   100 mcg Per Tube Q0600   liver oil-zinc  oxide   Topical BID   mouth rinse  15 mL Mouth Rinse 4 times per day   polyethylene glycol  17 g Per Tube BID   risperiDONE   3 mg Per Tube QHS   senna-docusate  1 tablet Oral BID   sodium chloride  flush  10-40 mL Intracatheter Q12H   traZODone   50 mg Per Tube QHS   venlafaxine   150 mg Per Tube BID WC   Continuous Infusions:  feeding supplement (OSMOLITE 1.5 CAL) 1,000 mL (03/24/24 1934)    Time  70  Colen Grimes, MD  [facility]

## 2024-03-26 NOTE — Plan of Care (Signed)

## 2024-03-26 NOTE — Progress Notes (Signed)
 Speech Language Pathology Treatment: Dysphagia  Patient Details Name: Colleen [name] MRN: [id number] DOB: April 13, 1951 Today's Date: 03/26/2024 Time: 9175-9165 SLP Time Calculation (min) (ACUTE ONLY): 10 min  Assessment / Plan / Recommendation Clinical Impression  Pt was trying to reach her tray upon SLP arrival, holding her coffee cup with the lid on, in a semi-reclined position. SLP assisted pt in repositioning as well as setting up meal tray. Min cues were given during self-feeding of PO trials, with only one cough noted with hot beverage. Otherwise, she had no overt s/s of aspiration, but her intake was limited (she declined most solids on her tray). Recommend continuing current diet, using precautions and providing assistance during meals for safety and to encourage intake.    HPI HPI: Colleen [name] is a 73 yo female presenting to ED 8/29 after a fall with headache, neck pain, and nausea. CT shows R scalp/periorbital hematoma, acute SDH along the R cerebral convexity measuring 8 mm thickness with mass effect and 8 mm leftward midline shift as well as an acute SDH along the R aspect of the falx measuring up to 5 mm in thickness, and an epidural hematoma at the craniocervical junction. MRI shows small dorsal epidural hematoma between C1 vertebrae and occiput. Worsening neurostatus 8/31, taken for R craniotomy for subdural hematoma evacuation. PMH includes HTN, asthma, GERD, anxiety/depression, thyroid  garter s/p total thyroidectomy 2013      SLP Plan  Continue with current plan of care          Recommendations  Diet recommendations: Dysphagia 2 (fine chop);Thin liquid Liquids provided via: Cup;Straw Medication Administration: Whole meds with puree Supervision: Staff to assist with self feeding;Full supervision/cueing for compensatory strategies Compensations: Minimize environmental distractions;Slow rate;Small sips/bites Postural Changes and/or Swallow Maneuvers: Seated upright 90  degrees                  Oral care QID;Oral care prior to ice chip/H20   Frequent or constant Supervision/Assistance Dysphagia, oropharyngeal phase (R13.12)     Continue with current plan of care     Leita SAILOR., M.A. CCC-SLP Acute Rehabilitation Services Office: [phone]  Secure chat preferred   03/26/2024, 9:46 AM

## 2024-03-26 NOTE — Progress Notes (Addendum)
 Occupational Therapy Treatment Patient Details Name: Colleen [name] MRN: [id number] DOB: Dec 09, 1950 Today's Date: 03/26/2024   History of present illness Colleen [name] is a 73 yo female presenting to ED 8/29 after a fall with headache, neck pain, and nausea. CT shows R scalp/periorbital hematoma, acute SDH along the R cerebral convexity measuring 8 mm thickness with mass effect and 8 mm leftward midline shift as well as an acute SDH along the R aspect of the falx measuring up to 5 mm in thickness, and an epidural hematoma at the craniocervical junction. MRI shows small dorsal epidural hematoma between C1 vertebrae and occiput. Worsening neurostatus 8/31, taken for R craniotomy for subdural hematoma evacuation. s/p EVD placement 9/4, removed 9/8. New dx LUE DVT 9/7 started on heparin  9/7, MD okayed mobility as of 9/8. PMH includes HTN, asthma, GERD, anxiety/depression, thyroid  garter s/p total thyroidectomy 2013   OT comments  Able to work on grooming and self feeding while up in the recliner.  Patient needing up to Mod A for self feeding and Min A for grooming for thoroughness.  Continues with perceptual deficits and difficulty with coordination to her L hand.  May need to try a plate guard.  OT will continue efforts in the acute setting to address deficits.  SNF recommended for post acute rehab.        If plan is discharge home, recommend the following:  A lot of help with bathing/dressing/bathroom;A lot of help with walking and/or transfers;Two people to help with walking and/or transfers;Assistance with cooking/housework;Assist for transportation;Help with stairs or ramp for entrance;Direct supervision/assist for financial management;Direct supervision/assist for medications management   Equipment Recommendations  None recommended by OT    Recommendations for Other Services      Precautions / Restrictions Precautions Precautions: Fall Recall of Precautions/Restrictions:  Impaired Precaution/Restrictions Comments: SBP < 160 Restrictions Weight Bearing Restrictions Per Provider Order: No              ADL either performed or assessed with clinical judgement   ADL   Eating/Feeding: Moderate assistance;Sitting   Grooming: Wash/dry hands;Wash/dry face;Moderate assistance;Sitting                                      Extremity/Trunk Assessment Upper Extremity Assessment Upper Extremity Assessment: Generalized weakness;Left hand dominant LUE Coordination: decreased fine motor;decreased gross motor   Lower Extremity Assessment Lower Extremity Assessment: Defer to PT evaluation   Cervical / Trunk Assessment Cervical / Trunk Assessment: Normal    Vision Baseline Vision/History: 1 Wears glasses Vision Assessment?: Vision impaired- to be further tested in functional context;Yes Depth Perception: Undershoots;Overshoots   Perception Perception Perception: Not tested   Praxis Praxis Praxis: Not tested   Communication Communication Communication: No apparent difficulties Factors Affecting Communication: Difficulty expressing self   Cognition Arousal: Alert Behavior During Therapy: Flat affect Cognition: No family/caregiver present to determine baseline                               Following commands: Impaired Following commands impaired: Follows one step commands with increased time      Cueing   Cueing Techniques: Verbal cues, Gestural cues, Tactile cues  Exercises      Shoulder Instructions       General Comments      Pertinent Vitals/ Pain       Pain Assessment  Pain Assessment: No/denies pain Pain Intervention(s): Monitored during session                                                          Frequency  Min 2X/week        Progress Toward Goals  OT Goals(current goals can now be found in the care plan section)  Progress towards OT goals: Progressing toward  goals  Acute Rehab OT Goals OT Goal Formulation: With patient Time For Goal Achievement: 04/09/24 Potential to Achieve Goals: Fair ADL Goals Pt Will Perform Eating: sitting;with supervision Pt Will Perform Upper Body Bathing: with min assist;sitting Pt Will Perform Upper Body Dressing: with min assist;sitting Pt Will Transfer to Toilet: with contact guard assist;ambulating;regular height toilet  Plan      Co-evaluation                 AM-PAC OT 6 Clicks Daily Activity     Outcome Measure   Help from another person eating meals?: A Lot Help from another person taking care of personal grooming?: A Lot Help from another person toileting, which includes using toliet, bedpan, or urinal?: Total Help from another person bathing (including washing, rinsing, drying)?: A Lot Help from another person to put on and taking off regular upper body clothing?: A Lot Help from another person to put on and taking off regular lower body clothing?: A Lot 6 Click Score: 11    End of Session    OT Visit Diagnosis: Unsteadiness on feet (R26.81);Muscle weakness (generalized) (M62.81);Other symptoms and signs involving cognitive function;Pain   Activity Tolerance Patient tolerated treatment well   Patient Left in chair;with call bell/phone within reach;with chair alarm set   Nurse Communication          Time: 8691-8667 OT Time Calculation (min): 24 min  Charges: OT General Charges $OT Visit: 1 Visit OT Treatments $Self Care/Home Management : 23-37 mins  03/26/2024  RP, OTR/L  Acute Rehabilitation Services  Office:  [phone]    Colleen [name] 03/26/2024, 1:42 PM

## 2024-03-26 NOTE — Progress Notes (Signed)
 Calorie Count Note  48 hour calorie count complete over the weekend.  Diet: dysphagia 2 with thin liquids Supplements: Ensure Plus High Protein po BID  9/12 Dinner: 370 kcal, 9 gm protein 9/13 no meal tickets saved; drank 2 supplements: 700 kcal, 40 gm protein 9/14 Breakfast: 320 kcal, 13 gm protein; drank no supplements 9/15 Breakfast: 493 kcal, 19 gm protein; drank 2 supplements: 700 kcal, 40 gm protein  Intake has improved per discussion with patient. She has been drinking the supplements and eating better at meals. Suspect patient is meeting her nutrition needs with intake of meals and supplements.   Nutrition Dx: Inadequate oral intake related to lethargy/confusion as evidenced by meal completion < 50%; resolved.  Goal: Patient will meet greater than or equal to 90% of their needs; met.  Intervention:  D/C calorie count; continue to encourage POs. Continue diet per SLP, currently on dysphagia 2 with thin liquids. Continue Ensure Plus High Protein po BID, each supplement provides 350 kcal and 20 grams of protein. Send Magic cup TID with meals, each supplement provides 290 kcal and 9 grams of protein.  Suzen HUNT RD, LDN, CNSC Contact via secure chat. If unavailable, use group chat RD Inpatient.

## 2024-03-26 NOTE — Progress Notes (Signed)
 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 Stable on repeat imaging yet patient was exhibiting lethargy, so the pt underwent R craniotomy for SDH evacuation on 8/31. Was doing well postop, but on POD4 had another neurologic decline with slight increase in global cerebral edema. Hypertonic saline started EVD placed for ICP monitoring which was stable for multiple days with waxing/waning mental status. EEG has been checked which was normal. Pt developed a RUE DVT and heparin  gtt was started on 9/7.  LOS: 14 days    Plan: Lethargy episodes: has undergone craniotomy with stable postop scans. Has undergone EVD for 3 days of ICP monitoring with all normal ICP's despite waxing/waning exam. Will plan to monitor neurologically moving forward knowing that she has FND/catatonia episodes. Will be difficult to examine at times. Pupillary changes will be most reliable Goal normonatremia SBP<160 Activity as tolerated Diet as tolerated No collar or intervention for CCJ findings RUE DVT - on therapeutic lovenox  Rehab consult placed Suture and staple removal on 9/18  Subjective: Undergoing calorie count. Neurologically stable  Objective: Vital signs in last 24 hours: Temp:  [98.2 F (36.8 C)-99.7 F (37.6 C)] 98.6 F (37 C) (09/15 0811) Pulse Rate:  [78-82] 78 (09/15 0811) Resp:  [18-20] 20 (09/15 0811) BP: (115-135)/(60-87) 128/65 (09/15 0811) SpO2:  [93 %-98 %] 96 % (09/15 0811)  Intake/Output from previous day: 09/14 0701 - 09/15 0700 In: 1180 [P.O.:800; NG/GT:180; IV Piggyback:200] Out: 850 [Urine:850] Intake/Output this shift: No intake/output data recorded.  Exam: GCS 4E 4V 15M Conversational, slight confused with conversation Conjugate gaze, PERRL Grimace symmetric Commands BUE and BLE full strength Cranial incision c/d/I, staples EVD incision c/d/I, sutures.  Lab  Results: Recent Labs    03/25/24 0557 03/26/24 0635  WBC 17.0* 14.8*  HGB 9.5* 8.0*  HCT 28.4* 23.6*  PLT 337 310   BMET Recent Labs    03/25/24 0557 03/26/24 0635  NA 128* 128*  K 3.9 3.8  CL 98 95*  CO2 20* 24  GLUCOSE 152* 94  BUN 20 16  CREATININE 0.67 0.82  CALCIUM  8.0* 8.1*       Dorn SAUNDERS Euphemia Lingerfelt 03/26/2024, 9:02 AM

## 2024-03-27 DIAGNOSIS — S065XAA Traumatic subdural hemorrhage with loss of consciousness status unknown, initial encounter: Secondary | ICD-10-CM | POA: Diagnosis not present

## 2024-03-27 LAB — GLUCOSE, CAPILLARY
Glucose-Capillary: 88 mg/dL (ref 70–99)
Glucose-Capillary: 82 mg/dL (ref 70–99)
Glucose-Capillary: 82 mg/dL (ref 70–99)
Glucose-Capillary: 91 mg/dL (ref 70–99)
Glucose-Capillary: 120 mg/dL — ABNORMAL HIGH (ref 70–99)

## 2024-03-27 LAB — CBC WITH DIFFERENTIAL/PLATELET
Abs Immature Granulocytes: 0.05 K/uL (ref 0.00–0.07)
Basophils Absolute: 0 K/uL (ref 0.0–0.1)
Basophils Relative: 0 %
Eosinophils Absolute: 0.1 K/uL (ref 0.0–0.5)
Eosinophils Relative: 1 %
HCT: 23.5 % — ABNORMAL LOW (ref 36.0–46.0)
Hemoglobin: 8 g/dL — ABNORMAL LOW (ref 12.0–15.0)
Immature Granulocytes: 1 %
Lymphocytes Relative: 8 %
Lymphs Abs: 0.7 K/uL (ref 0.7–4.0)
MCH: 32.5 pg (ref 26.0–34.0)
MCHC: 34 g/dL (ref 30.0–36.0)
MCV: 95.5 fL (ref 80.0–100.0)
Monocytes Absolute: 0.7 K/uL (ref 0.1–1.0)
Monocytes Relative: 8 %
Neutro Abs: 6.9 K/uL (ref 1.7–7.7)
Neutrophils Relative %: 82 %
Platelets: 345 K/uL (ref 150–400)
RBC: 2.46 MIL/uL — ABNORMAL LOW (ref 3.87–5.11)
RDW: 13.4 % (ref 11.5–15.5)
WBC: 8.4 K/uL (ref 4.0–10.5)
nRBC: 0 % (ref 0.0–0.2)

## 2024-03-27 LAB — BASIC METABOLIC PANEL WITH GFR
Anion gap: 9 (ref 5–15)
BUN: 17 mg/dL (ref 8–23)
CO2: 25 mmol/L (ref 22–32)
Calcium: 8.2 mg/dL — ABNORMAL LOW (ref 8.9–10.3)
Chloride: 94 mmol/L — ABNORMAL LOW (ref 98–111)
Creatinine, Ser: 0.76 mg/dL (ref 0.44–1.00)
GFR, Estimated: >60 mL/min (ref >60)
Glucose, Bld: 88 mg/dL (ref 70–99)
Potassium: 3.6 mmol/L (ref 3.5–5.1)
Sodium: 128 mmol/L — ABNORMAL LOW (ref 135–145)

## 2024-03-27 MED ORDER — IRBESARTAN 300 MG PO TABS
300.0000 mg | ORAL_TABLET | Freq: Every day | ORAL | Status: DC
  Administered 2024-03-28 – 2024-03-30 (×3): 300 mg via ORAL
  Filled 2024-03-27 (×3): qty 1

## 2024-03-27 MED ORDER — VENLAFAXINE HCL 75 MG PO TABS: 150.0000 mg | ORAL_TABLET | Freq: Two times a day (BID) | ORAL | Status: DC

## 2024-03-27 MED ORDER — DICYCLOMINE HCL 10 MG PO CAPS
10.0000 mg | ORAL_CAPSULE | Freq: Three times a day (TID) | ORAL | Status: DC
  Administered 2024-03-27 – 2024-03-30 (×13): 10 mg via ORAL
  Filled 2024-03-27 (×14): qty 1

## 2024-03-27 MED ORDER — RISPERIDONE 0.5 MG PO TABS
1.0000 mg | ORAL_TABLET | Freq: Every day | ORAL | Status: DC
  Administered 2024-03-27 – 2024-03-29 (×3): 1 mg via ORAL
  Filled 2024-03-27 (×3): qty 2

## 2024-03-27 MED ORDER — ACETAMINOPHEN 160 MG/5ML PO SOLN: 650.0000 mg | ORAL | Status: DC | PRN

## 2024-03-27 MED ORDER — LEVOTHYROXINE SODIUM 100 MCG PO TABS
100.0000 ug | ORAL_TABLET | Freq: Every day | ORAL | Status: DC
Start: 2024-03-28 — End: 2024-03-30
  Administered 2024-03-28 – 2024-03-30 (×3): 100 ug via ORAL
  Filled 2024-03-27 (×3): qty 1

## 2024-03-27 MED ORDER — DICYCLOMINE HCL 10 MG/5ML PO SOLN
10.0000 mg | Freq: Three times a day (TID) | ORAL | Status: DC
Start: 2024-03-27 — End: 2024-03-27

## 2024-03-27 MED ORDER — DICYCLOMINE HCL 10 MG/5ML PO SOLN
10.0000 mg | Freq: Three times a day (TID) | ORAL | Status: DC
  Filled 2024-03-27 (×2): qty 5

## 2024-03-27 MED ORDER — LAMOTRIGINE 25 MG PO TABS
150.0000 mg | ORAL_TABLET | Freq: Every day | ORAL | Status: DC
  Administered 2024-03-28 – 2024-03-30 (×3): 150 mg via ORAL
  Filled 2024-03-27 (×3): qty 2

## 2024-03-27 MED ORDER — TRAZODONE HCL 50 MG PO TABS
50.0000 mg | ORAL_TABLET | Freq: Every day | ORAL | Status: DC
  Administered 2024-03-27 – 2024-03-29 (×3): 50 mg via ORAL
  Filled 2024-03-27 (×3): qty 1

## 2024-03-27 MED ORDER — ACETAMINOPHEN 160 MG/5ML PO SOLN
650.0000 mg | ORAL | Status: DC | PRN
  Filled 2024-03-27: qty 20.3

## 2024-03-27 MED ORDER — VENLAFAXINE HCL ER 75 MG PO CP24
300.0000 mg | ORAL_CAPSULE | Freq: Every day | ORAL | Status: DC
  Administered 2024-03-28 – 2024-03-30 (×3): 300 mg via ORAL
  Filled 2024-03-27 (×3): qty 4

## 2024-03-27 MED ORDER — BUSPIRONE HCL 15 MG PO TABS
7.5000 mg | ORAL_TABLET | Freq: Two times a day (BID) | ORAL | Status: DC
  Administered 2024-03-27 – 2024-03-30 (×6): 7.5 mg via ORAL
  Filled 2024-03-27 (×7): qty 1

## 2024-03-27 MED ORDER — CARVEDILOL 12.5 MG PO TABS
12.5000 mg | ORAL_TABLET | Freq: Two times a day (BID) | ORAL | Status: DC
  Administered 2024-03-27 – 2024-03-30 (×7): 12.5 mg via ORAL
  Filled 2024-03-27 (×7): qty 1

## 2024-03-27 MED ORDER — ENSURE PLUS HIGH PROTEIN PO LIQD
237.0000 mL | Freq: Two times a day (BID) | ORAL | Status: DC
  Administered 2024-03-27 – 2024-03-29 (×5): 237 mL via ORAL

## 2024-03-27 MED ORDER — RISPERIDONE 0.5 MG PO TABS: 1.0000 mg | ORAL_TABLET | Freq: Every day | ORAL | Status: DC

## 2024-03-27 MED ORDER — HYDRALAZINE HCL 50 MG PO TABS
100.0000 mg | ORAL_TABLET | Freq: Three times a day (TID) | ORAL | Status: DC
  Administered 2024-03-27 – 2024-03-30 (×9): 100 mg via ORAL
  Filled 2024-03-27 (×10): qty 2

## 2024-03-27 MED ORDER — VENLAFAXINE HCL ER 75 MG PO CP24: 300.0000 mg | ORAL_CAPSULE | Freq: Every day | ORAL | Status: DC

## 2024-03-27 NOTE — Discharge Instructions (Signed)
 Please notify the office if you experience leaking from the incision, incisional redness, severe headache, lethargy, stroke-like symptoms. We recommend you keep your incision clean and dry. Please refrain from placing any foreign object over the incision including hats, handkerchiefs, wigs, etc. You may shower now. You can allow soap and water to run over the incision. Do not scrub. Blot dry  Information on my medicine - ELIQUIS  (apixaban )  This medication education was reviewed with me or my healthcare representative as part of my discharge preparation.  Why was Eliquis  prescribed for you? Eliquis  was prescribed to treat blood clots that may have been found in the veins of your legs (deep vein thrombosis) or in your lungs (pulmonary embolism) and to reduce the risk of them occurring again.  What do You need to know about Eliquis  ? The dose is ONE 5 mg tablet taken TWICE daily.  Eliquis  may be taken with or without food.   Try to take the dose about the same time in the morning and in the evening. If you have difficulty swallowing the tablet whole please discuss with your pharmacist how to take the medication safely.  Take Eliquis  exactly as prescribed and DO NOT stop taking Eliquis  without talking to the doctor who prescribed the medication.  Stopping may increase your risk of developing a new blood clot.  Refill your prescription before you run out.  After discharge, you should have regular check-up appointments with your healthcare provider that is prescribing your Eliquis .    What do you do if you miss a dose? If a dose of ELIQUIS  is not taken at the scheduled time, take it as soon as possible on the same day and twice-daily administration should be resumed. The dose should not be doubled to make up for a missed dose.  Important Safety Information A possible side effect of Eliquis  is bleeding. You should call your healthcare provider right away if you experience any of the  following: Bleeding from an injury or your nose that does not stop. Unusual colored urine (red or dark brown) or unusual colored stools (red or black). Unusual bruising for unknown reasons. A serious fall or if you hit your head (even if there is no bleeding).  Some medicines may interact with Eliquis  and might increase your risk of bleeding or clotting while on Eliquis . To help avoid this, consult your healthcare provider or pharmacist prior to using any new prescription or non-prescription medications, including herbals, vitamins, non-steroidal anti-inflammatory drugs (NSAIDs) and supplements.  This website has more information on Eliquis  (apixaban ): http://[email or web address] .com/eliquis dena

## 2024-03-27 NOTE — NC FL2 (Signed)
 [place]  MEDICAID FL2 LEVEL OF CARE FORM     IDENTIFICATION  Patient Name: Colleen [name] Birthdate: Jun 03, 1951 Sex: female Admission Date (Current Location): 03/09/2024  [facility] and [place] Number:  Producer, television/film/video and Address:  [facility], 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/2021   MDD (major depressive disorder), recurrent severe, without psychosis (HCC) 10/09/2021   Suicidal ideation 10/07/2021   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   Abnormal involuntary movements 03/31/2015   Syncope 04/29/2014   Sinus tachycardia 11/08/2013   Allergic rhinitis, seasonal 06/30/2011   Fibrocystic breast changes 12/10/2010   PALPITATIONS 04/15/2009   Anxiety state 01/01/2008   GERD 01/01/2008    Orientation RESPIRATION BLADDER Height & Weight     Self, Time, Situation, Place  Normal Continent, Incontinent Weight: 157 lb 10.1 oz (71.5 kg) Height:  5' 4.02 (162.6 cm)  BEHAVIORAL SYMPTOMS/MOOD NEUROLOGICAL BOWEL NUTRITION STATUS      Continent Diet (See dc summary)  AMBULATORY STATUS COMMUNICATION OF NEEDS Skin   Extensive Assist Verbally Surgical wounds, Other (Comment) (Closed incision on head; wound on arm)                       Personal Care Assistance Level of Assistance  Bathing, Feeding, Dressing Bathing Assistance: Maximum assistance Feeding assistance:  Maximum assistance Dressing Assistance: Maximum assistance     Functional Limitations Info  Sight, Speech Sight Info: Impaired   Speech Info: Impaired (dysarthria, delayed speech)    SPECIAL CARE FACTORS FREQUENCY  PT (By licensed PT), OT (By licensed OT), Speech therapy     PT Frequency: 5x/week OT Frequency: 5x/week     Speech Therapy Frequency: 2x/week      Contractures Contractures Info: Not present    Additional Factors Info  Code Status, Allergies Code Status Info: Full Allergies Info: Amlodipine , Cyclobenzaprine Hcl, Aripiprazole, Cymbalta  (Duloxetine  Hcl), Tape           Current Medications (03/27/2024):  This is the current hospital active medication list Current Facility-Administered Medications  Medication Dose Route Frequency Provider Last Rate Last Admin   acetaminophen  (TYLENOL ) 160 MG/5ML solution 650 mg  650 mg Per Tube Q4H PRN Gretel Prentice BIRCH, RPH   650 mg at 03/24/24 1247   busPIRone  (BUSPAR ) tablet 7.5 mg  7.5 mg Per Tube BID Gretel Prentice BIRCH, RPH   7.5 mg at 03/26/24 7668   carvedilol  (COREG ) tablet 12.5 mg  12.5 mg Per Tube BID WC Gretel Prentice BIRCH, RPH   12.5 mg at 03/26/24 1740   Chlorhexidine  Gluconate Cloth 2 % PADS 6 each  6 each Topical Daily Maree Harder, MD   6 each at 03/25/24 0819   dextrose  50 % solution 50 mL  1 ampule Intravenous PRN Sundil, Subrina, MD       dicyclomine  (BENTYL ) capsule 10 mg  10 mg Oral TID AC & HS Swayze,  Ava, DO   10 mg at 03/27/24 0545   enoxaparin  (LOVENOX ) injection 100 mg  100 mg Subcutaneous Q24H Paytes, Austin A, RPH   100 mg at 03/26/24 1226   feeding supplement (ENSURE PLUS HIGH PROTEIN) liquid 237 mL  237 mL Oral BID BM Gherghe, Costin M, MD   237 mL at 03/26/24 1520   Gerhardt's butt cream   Topical BID Samtani, Jai-Gurmukh, MD   Given at 03/26/24 2333   hydrALAZINE  (APRESOLINE ) injection 10 mg  10 mg Intravenous Q1H PRN Darnella Dorn SAUNDERS, MD   10 mg at 03/21/24 1641   hydrALAZINE  (APRESOLINE ) tablet 100 mg  100  mg Per Tube Q8H Gretel Prentice BIRCH, RPH   100 mg at 03/27/24 0545   ipratropium-albuterol  (DUONEB) 0.5-2.5 (3) MG/3ML nebulizer solution 3 mL  3 mL Nebulization Q6H PRN Marion Damien DASEN, MD   3 mL at 03/21/24 9166   irbesartan  (AVAPRO ) tablet 300 mg  300 mg Per Tube Daily Gretel Prentice BIRCH, RPH   300 mg at 03/26/24 1008   labetalol  (NORMODYNE ) injection 10-40 mg  10-40 mg Intravenous Q10 min PRN Garst, Jonathan R, MD   20 mg at 03/20/24 9372   lamoTRIgine  (LAMICTAL ) tablet 150 mg  150 mg Per Tube Daily Gretel Prentice BIRCH, RPH   150 mg at 03/26/24 1008   levothyroxine  (SYNTHROID ) tablet 100 mcg  100 mcg Per Tube Q0600 Gretel Prentice BIRCH, RPH   100 mcg at 03/27/24 9453   liver oil-zinc  oxide (DESITIN) 40 % ointment   Topical BID Pawar, Rahul, MD   Given at 03/26/24 2333   ondansetron  (ZOFRAN ) tablet 4 mg  4 mg Oral Q4H PRN Darnella Dorn SAUNDERS, MD       Or   ondansetron  (ZOFRAN ) injection 4 mg  4 mg Intravenous Q4H PRN Garst, Jonathan R, MD   4 mg at 03/24/24 9257   Oral care mouth rinse  15 mL Mouth Rinse 4 times per day Darnella Dorn SAUNDERS, MD   15 mL at 03/26/24 2334   Oral care mouth rinse  15 mL Mouth Rinse PRN Darnella Dorn SAUNDERS, MD   15 mL at 03/15/24 2234   risperiDONE  (RISPERDAL ) tablet 1 mg  1 mg Per Tube QHS Samtani, Jai-Gurmukh, MD       sodium chloride  flush (NS) 0.9 % injection 10-40 mL  10-40 mL Intracatheter Q12H Darnella Dorn SAUNDERS, MD   10 mL at 03/26/24 2334   sodium chloride  flush (NS) 0.9 % injection 10-40 mL  10-40 mL Intracatheter PRN Darnella Dorn SAUNDERS, MD   10 mL at 03/17/24 0958   traZODone  (DESYREL ) tablet 50 mg  50 mg Per Tube QHS Gretel Prentice BIRCH, RPH   50 mg at 03/26/24 2331   [START ON 03/28/2024] venlafaxine  XR (EFFEXOR -XR) 24 hr capsule 300 mg  300 mg Oral QPC breakfast Samtani, Jai-Gurmukh, MD         Discharge Medications: Please see discharge summary for a list of discharge medications.  Relevant Imaging Results:  Relevant Lab Results:   Additional Information SSN   [id number]  [name], [place]

## 2024-03-27 NOTE — Progress Notes (Addendum)
 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 Stable on repeat imaging yet patient was exhibiting lethargy, so the pt underwent R craniotomy for SDH evacuation on 8/31. Was doing well postop, but on POD4 had another neurologic decline with slight increase in global cerebral edema. Hypertonic saline started EVD placed for ICP monitoring which was stable for multiple days with waxing/waning mental status. EEG has been checked which was normal. Pt developed a RUE DVT and heparin  gtt was started on 9/7. Now on therapeutic lovenox   LOS: 15 days    Plan: Lethargy episodes: has undergone craniotomy with stable postop scans. Has undergone EVD for 3 days of ICP monitoring with all normal ICP's despite waxing/waning exam. Will plan to monitor neurologically moving forward knowing that she has FND/catatonia episodes. Will be difficult to examine at times. Pupillary changes will be most reliable Goal normonatremia SBP<160 Activity as tolerated Diet as tolerated No collar or intervention for CCJ findings RUE DVT - on therapeutic lovenox  Ok for discharge from nsgy standpoint Will plan for 4 week follow-up Neurosurgery team to sign off. Thank you for allowing us  to participate in the care of this patient. Please do not hesitate to call us  with questions or concerns    Subjective: Sutures and staples removed. Neurologically stable. NGT removed  Objective: Vital signs in last 24 hours: Temp:  [97.5 F (36.4 C)-98.4 F (36.9 C)] 98.4 F (36.9 C) (09/16 1300) Pulse Rate:  [71-83] 83 (09/16 1300) Resp:  [16-19] 19 (09/16 0803) BP: (91-137)/(54-85) 116/54 (09/16 1300) SpO2:  [91 %-97 %] 97 % (09/16 0803) Weight:  [71.5 kg] 71.5 kg (09/16 0434)  Intake/Output from previous day: 09/15 0701 - 09/16 0700 In: 1040 [P.O.:1040] Out: 200 [Urine:200] Intake/Output this shift: No intake/output data  recorded.  Exam: GCS 4E 4V 66M Conversational, slight confused with conversation Conjugate gaze, PERRL Grimace symmetric Commands BUE and BLE full strength Cranial incision c/d/I EVD incision c/d/I  Lab Results: Recent Labs    03/26/24 0635 03/27/24 1118  WBC 14.8* 8.4  HGB 8.0* 8.0*  HCT 23.6* 23.5*  PLT 310 345   BMET Recent Labs    03/26/24 0635 03/27/24 1118  NA 128* 128*  K 3.8 3.6  CL 95* 94*  CO2 24 25  GLUCOSE 94 88  BUN 16 17  CREATININE 0.82 0.76  CALCIUM  8.1* 8.2*       Dorn SAUNDERS Joleah Kosak 03/27/2024, 1:49 PM

## 2024-03-27 NOTE — TOC Progression Note (Addendum)
 Transition of Care [facility]) - Progression Note    Patient Details  Name: Colleen [name] MRN: [id number] Date of Birth: [birth year]  [facility] CM/SW Contact  Isaiah Public, LCSWA Phone Number: 03/27/2024, 5:17 PM  Clinical Narrative:     Due to patients current orientation CSW spoke with patients daughter [name] and provided patients SNF bed offers. Patients daughter [name] accepted SNF bed offer [facility]. CSW informed Kia with [facility]. CSW will continue to follow.   Expected Discharge Plan: Skilled Nursing Facility Barriers to Discharge: Continued Medical Work up, SNF Pending bed offer               Expected Discharge Plan and Services In-house Referral: Clinical Social Work Discharge Planning Services: CM Consult Post Acute Care Choice: Skilled Nursing Facility Living arrangements for the past 2 months: Single Family Home                                       Social Drivers of Health (SDOH) Interventions SDOH Screenings   Food Insecurity: No Food Insecurity (03/09/2024)  Housing: Low Risk  (03/09/2024)  Transportation Needs: No Transportation Needs (03/09/2024)  Utilities: Not At Risk (03/09/2024)  Alcohol Screen: Low Risk  (10/10/2021)  Depression (PHQ2-9): High Risk (10/09/2021)  Social Connections: Patient Declined (03/09/2024)  Tobacco Use: Medium Risk (03/09/2024)    Readmission Risk Interventions     No data to display

## 2024-03-27 NOTE — Plan of Care (Signed)
  Problem: Education: Goal: Knowledge of General Education information will improve Description: Including pain rating scale, medication(s)/side effects and non-pharmacologic comfort measures Outcome: Progressing   Problem: Health Behavior/Discharge Planning: Goal: Ability to manage health-related needs will improve Outcome: Progressing   Problem: Clinical Measurements: Goal: Ability to maintain clinical measurements within normal limits will improve Outcome: Progressing Goal: Will remain free from infection Outcome: Progressing Goal: Diagnostic test results will improve Outcome: Progressing Goal: Respiratory complications will improve Outcome: Progressing Goal: Cardiovascular complication will be avoided Outcome: Progressing   Problem: Activity: Goal: Risk for activity intolerance will decrease Outcome: Progressing   Problem: Nutrition: Goal: Adequate nutrition will be maintained Outcome: Progressing   Problem: Coping: Goal: Level of anxiety will decrease Outcome: Progressing   Problem: Elimination: Goal: Will not experience complications related to bowel motility Outcome: Progressing Goal: Will not experience complications related to urinary retention Outcome: Progressing   Problem: Pain Managment: Goal: General experience of comfort will improve and/or be controlled Outcome: Progressing   Problem: Safety: Goal: Ability to remain free from injury will improve Outcome: Progressing   Problem: Skin Integrity: Goal: Risk for impaired skin integrity will decrease Outcome: Progressing   Problem: Education: Goal: Knowledge of the prescribed therapeutic regimen will improve Outcome: Progressing   Problem: Clinical Measurements: Goal: Usual level of consciousness will be regained or maintained. Outcome: Progressing Goal: Neurologic status will improve Outcome: Progressing Goal: Ability to maintain intracranial pressure will improve Outcome: Progressing    Problem: Skin Integrity: Goal: Demonstration of wound healing without infection will improve Outcome: Progressing

## 2024-03-27 NOTE — Progress Notes (Signed)
 [facility]   ROUNDING   NOTE Colleen [name] FMW:[id number]  DOB: 1950-11-15  DOA: 03/09/2024  PCP: Leonel Cole, MD  03/27/2024,9:42 AM  LOS: 15 days    Code Status: Home     from: Home   73 year old white female Anxiety catatonia lethargy HTN asthma Admitted with fall traumatic SDH and ensuant issues as below  Significant Hospital Events: Including procedures, antibiotic start and stop dates in addition to other pertinent events   8/29 admit by neurosurg for right SDH and Cervical Spine Hematoma  8/31 right craniectomy 9/2: Patient became confused over the evening.  CT head showed worsening SDH.  Repeat CT head with stable SDH.  Discussed with neurosurgery, expected postop changes.  No surgical interventions.  EEG negative. 9/3: all drains are out.  9/4: Worsening mentation.  Stat CT head showed slight worsening SDH [9 mm] with persistent midline shift to left, increased cerebral edema with crowding of basilar cisterns increased effacement of fourth ventricle and partial effacement of prepontine cistern and inferior descent of cerebral tonsils.  Status post EVD placement.  Only transdusing. 9/7 RUE DVT; hep gtt no bolus  9/8 repeat CT H unchanged  9/9 dc foley, try to get PIV to dc PICC  9/10 transferred to hospitalist service      Assessment  & Plan :    Traumatic SDH ground-level fall brain compression Right craniectomy/EVD placed 9 4 removed 9/8 Deferring to neurosurgery who are following intermittently as no further neurosurgical needs Continues Lamictal  150 daily which is her home medication Stop fentanyl  completely given somnolence Leukocytosis over the past 2 days Some risk of aspiration probably because of sleepiness and being on several medications which promote that such as her antidepressants and previously fentanyl  WBC pending from this morning Already treated recently 9/10 through 9/14 with Unasyn  so I do not think we would treat further unless gross aspiration cough or  fever X-ray 9/15 shows heterogeneous opacities with atelectasis or scarring Right subclavian axillary DVT due to PICC line Continues on Lovenox  therapeutic  In the next 24 hours if continues to tolerate p.o. switch to DOAC Treated for aspiration pneumonia Dysphagia secondary to brain damage Dysphagia 2 diet-previous MBS showed high risk of aspiration repeat 1 done on 9/12 allowed her to have current diet SLP to continue to revisit on dysphagia 2 diet Maintaining adequate calories close to 90% of her needs and we will discontinue core track 9/16 Continues Bentyl  10 3 times daily meals Reglan  5 every 6 as needed nausea Mild hyponatremia Baseline seems to be above 140 She has progressively become hyponatremic probably from excessive free water by mouth since 9/11 Fluid restrict 1200 cc stop all free water  Recheck this morning's labs HTN Stable at this time and controlled on Coreg  12.5  twice daily irbesartan  300 with PRNs of labetalol  and hydralazine  PTA sacral decubitus Gerhardt Butt cream review wounds as able 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 regimen of BuSpar  7.5 twice daily Risperdal  1 mg daily trazodone  50 at bedtime and Effexor  300 daily It appears that she was on slightly higher doses on transfer from ICU which Adjusted to her home regimen Hypothyroidism Continue Synthroid  100 mcg daily Resolved hypokalemia   Data Reviewed:   Labs pending  DVT prophylaxis:  Lovenox   Status is: Inpatient Remains inpatient appropriate because:   Requires further discussions     Current Dispo: Hospital     Subjective:    Looks well feels fair-good sense of humor joking Coherent Up and  about Spilled her coffee asking to be cleaned Eating well   Objective + exam Vitals:   03/27/24 0347 03/27/24 0434 03/27/24 0545 03/27/24 0803  BP: 114/85  137/72 130/72  Pulse: 77   82  Resp: 18   19  Temp: (!) 97.5 F (36.4 C)   (!) 97.5 F (36.4 C)  TempSrc: Oral   Oral   SpO2: 95%   97%  Weight:  71.5 kg    Height:       Filed Weights   03/24/24 0500 03/25/24 0500 03/27/24 0434  Weight: 72 kg 71.8 kg 71.5 kg     Examination:  Power 5/5 major muscle groups Postop changes to skull external ocular movements intact mild strabismus on the right Chest is clear no wheeze S1-S2 no murmur no rub no gallop Psych euthymic   Scheduled Meds:  busPIRone   7.5 mg Per Tube BID   carvedilol   12.5 mg Per Tube BID WC   Chlorhexidine  Gluconate Cloth  6 each Topical Daily   dicyclomine   10 mg Oral TID AC & HS   enoxaparin  (LOVENOX ) injection  100 mg Subcutaneous Q24H   feeding supplement  237 mL Oral BID BM   feeding supplement (PROSource TF20)  60 mL Per Tube Daily   Gerhardt's butt cream   Topical BID   hydrALAZINE   100 mg Per Tube Q8H   irbesartan   300 mg Per Tube Daily   lamoTRIgine   150 mg Per Tube Daily   levothyroxine   100 mcg Per Tube Q0600   liver oil-zinc  oxide   Topical BID   mouth rinse  15 mL Mouth Rinse 4 times per day   risperiDONE   1 mg Per Tube QHS   sodium chloride  flush  10-40 mL Intracatheter Q12H   traZODone   50 mg Per Tube QHS   [START ON 03/28/2024] venlafaxine  XR  300 mg Oral QPC breakfast   Continuous Infusions:  feeding supplement (OSMOLITE 1.5 CAL) 1,000 mL (03/24/24 1934)    Time  30  Colen Grimes, MD  [facility]

## 2024-03-27 NOTE — TOC Progression Note (Signed)
 Transition of Care [facility]) - Progression Note    Patient Details  Name: Colleen [name] MRN: [id number] Date of Birth: Apr 19, 1951  Transition of [facility]) CM/SW Contact  [name], [place] Phone Number: 03/27/2024, 9:52 AM  Clinical Narrative:   CSW updated that cortrak being removed today. CSW completed referral for SNF and faxed out. Will follow up with patient on bed offers.    Expected Discharge Plan: Skilled Nursing Facility Barriers to Discharge: Continued Medical Work up, SNF Pending bed offer               Expected Discharge Plan and Services In-house Referral: Clinical Social Work Discharge Planning Services: CM Consult Post Acute Care Choice: Skilled Nursing Facility Living arrangements for the past 2 months: Single Family Home                                       Social Drivers of Health (SDOH) Interventions SDOH Screenings   Food Insecurity: No Food Insecurity (03/09/2024)  Housing: Low Risk  (03/09/2024)  Transportation Needs: No Transportation Needs (03/09/2024)  Utilities: Not At Risk (03/09/2024)  Alcohol Screen: Low Risk  (10/10/2021)  Depression (PHQ2-9): High Risk (10/09/2021)  Social Connections: Patient Declined (03/09/2024)  Tobacco Use: Medium Risk (03/09/2024)    Readmission Risk Interventions     No data to display

## 2024-03-27 NOTE — Progress Notes (Signed)
 Physical Therapy Treatment Patient Details Name: Colleen [name] MRN: [id number] DOB: Dec 25, 1950 Today's Date: 03/27/2024   History of Present Illness Colleen [name] is a 73 yo female presenting to ED 8/29 after a fall with headache, neck pain, and nausea. CT shows R scalp/periorbital hematoma, acute SDH along the R cerebral convexity measuring 8 mm thickness with mass effect and 8 mm leftward midline shift as well as an acute SDH along the R aspect of the falx measuring up to 5 mm in thickness, and an epidural hematoma at the craniocervical junction. MRI shows small dorsal epidural hematoma between C1 vertebrae and occiput. Worsening neurostatus 8/31, taken for R craniotomy for subdural hematoma evacuation. s/p EVD placement 9/4, removed 9/8. New dx LUE DVT 9/7 started on heparin  9/7, MD okayed mobility as of 9/8. PMH includes HTN, asthma, GERD, anxiety/depression, thyroid  garter s/p total thyroidectomy 2013    PT Comments  Pt making steady progress towards her physical therapy goals. She was received sitting up in chair and her affect appears a little brighter today. Session focused on gait training, dynamic standing balance and balance activities. Pt requiring min-mod assist for dynamic activities and chair follow utilized for safety and for rest breaks when needed. Pt overall with good activity tolerance throughout. Recommend post acute rehab.    If plan is discharge home, recommend the following: Help with stairs or ramp for entrance;Assist for transportation;Supervision due to cognitive status;A lot of help with walking and/or transfers;A lot of help with bathing/dressing/bathroom   Can travel by private vehicle     Yes  Equipment Recommendations  BSC/3in1;Rolling walker (2 wheels)    Recommendations for Other Services       Precautions / Restrictions Precautions Precautions: Fall Recall of Precautions/Restrictions: Impaired Precaution/Restrictions Comments: SBP <  160 Restrictions Weight Bearing Restrictions Per Provider Order: No     Mobility  Bed Mobility               General bed mobility comments: OOB in chair    Transfers Overall transfer level: Needs assistance Equipment used: Rolling walker (2 wheels) Transfers: Sit to/from Stand Sit to Stand: Contact guard assist                Ambulation/Gait Ambulation/Gait assistance: Min assist, Mod assist, +2 safety/equipment Gait Distance (Feet): 60 Feet (60, 60) Assistive device: Rolling walker (2 wheels) Gait Pattern/deviations: Step-through pattern, Decreased stride length, Trunk flexed, Drifts right/left, Step-to pattern Gait velocity: decreased Gait velocity interpretation: <1.8 ft/sec, indicate of risk for recurrent falls   General Gait Details: Verbal/visual cueing for aiming towards diamonds, in center of floor for maintaining straight path and cueing for larger step lengths. Pt continues with right lateral lean and left drift   Stairs             Wheelchair Mobility     Tilt Bed    Modified Rankin (Stroke Patients Only)       Balance Overall balance assessment: Needs assistance Sitting-balance support: Feet supported Sitting balance-Leahy Scale: Fair     Standing balance support: During functional activity, No upper extremity supported Standing balance-Leahy Scale: Fair                              Hotel manager: No apparent difficulties Factors Affecting Communication: Difficulty expressing self  Cognition Arousal: Alert Behavior During Therapy: Flat affect   PT - Cognitive impairments: Safety/Judgement, Problem solving, Sequencing  Following commands: Impaired Following commands impaired: Follows one step commands with increased time    Cueing Cueing Techniques: Verbal cues, Gestural cues, Tactile cues  Exercises Other Exercises Other Exercises: Standing:  functional reaching with BUE's and visual scanning task on bulletin board Other Exercises: Lateral stepping to R/L x 5 ft each (x 2 sets)    General Comments        Pertinent Vitals/Pain Pain Assessment Pain Assessment: Faces Faces Pain Scale: No hurt    Home Living                          Prior Function            PT Goals (current goals can now be found in the care plan section) Acute Rehab PT Goals Patient Stated Goal: did not state Time For Goal Achievement: 04/09/24 Potential to Achieve Goals: Good Progress towards PT goals: Progressing toward goals    Frequency    Min 2X/week      PT Plan      Co-evaluation              AM-PAC PT 6 Clicks Mobility   Outcome Measure  Help needed turning from your back to your side while in a flat bed without using bedrails?: A Little Help needed moving from lying on your back to sitting on the side of a flat bed without using bedrails?: A Little Help needed moving to and from a bed to a chair (including a wheelchair)?: A Little Help needed standing up from a chair using your arms (e.g., wheelchair or bedside chair)?: A Little Help needed to walk in hospital room?: A Lot Help needed climbing 3-5 steps with a railing? : A Lot 6 Click Score: 16    End of Session Equipment Utilized During Treatment: Gait belt Activity Tolerance: Patient tolerated treatment well Patient left: in chair;with call bell/phone within reach;with chair alarm set Nurse Communication: Mobility status PT Visit Diagnosis: Unsteadiness on feet (R26.81);Difficulty in walking, not elsewhere classified (R26.2)     Time: 8960-8943 PT Time Calculation (min) (ACUTE ONLY): 17 min  Charges:    $Therapeutic Activity: 8-22 mins PT General Charges $$ ACUTE PT VISIT: 1 Visit                     Aleck Daring, PT, DPT Acute Rehabilitation Services Office [phone]    [name] 03/27/2024, 12:23 PM

## 2024-03-27 NOTE — Plan of Care (Signed)
 Family thought pt had a change because she felt like her speech was more slurred. She had been up since 5-6 am without any nap. She did not have a change, as her vitals and NIHSS were unchanged. She followed commands. Pt said she felt fine.  Assessment was completed and was assisted back to bed & fell asleep immediately.    Problem: Clinical Measurements: Goal: Will remain free from infection Outcome: Progressing   Problem: Clinical Measurements: Goal: Diagnostic test results will improve Outcome: Progressing   Problem: Clinical Measurements: Goal: Respiratory complications will improve Outcome: Progressing   Problem: Clinical Measurements: Goal: Cardiovascular complication will be avoided Outcome: Progressing   Problem: Activity: Goal: Risk for activity intolerance will decrease Outcome: Progressing   Problem: Nutrition: Goal: Adequate nutrition will be maintained Outcome: Progressing   Problem: Skin Integrity: Goal: Risk for impaired skin integrity will decrease Outcome: Progressing   Problem: Safety: Goal: Ability to remain free from injury will improve Outcome: Progressing

## 2024-03-28 DIAGNOSIS — S065XAA Traumatic subdural hemorrhage with loss of consciousness status unknown, initial encounter: Secondary | ICD-10-CM | POA: Diagnosis not present

## 2024-03-28 LAB — GLUCOSE, CAPILLARY
Glucose-Capillary: 94 mg/dL (ref 70–99)
Glucose-Capillary: 102 mg/dL — ABNORMAL HIGH (ref 70–99)
Glucose-Capillary: 116 mg/dL — ABNORMAL HIGH (ref 70–99)
Glucose-Capillary: 86 mg/dL (ref 70–99)
Glucose-Capillary: 90 mg/dL (ref 70–99)

## 2024-03-28 LAB — BASIC METABOLIC PANEL WITH GFR
Anion gap: 9 (ref 5–15)
BUN: 12 mg/dL (ref 8–23)
CO2: 24 mmol/L (ref 22–32)
Calcium: 8.1 mg/dL — ABNORMAL LOW (ref 8.9–10.3)
Chloride: 97 mmol/L — ABNORMAL LOW (ref 98–111)
Creatinine, Ser: 0.71 mg/dL (ref 0.44–1.00)
GFR, Estimated: >60 mL/min (ref >60)
Glucose, Bld: 99 mg/dL (ref 70–99)
Potassium: 3.7 mmol/L (ref 3.5–5.1)
Sodium: 130 mmol/L — ABNORMAL LOW (ref 135–145)

## 2024-03-28 NOTE — Plan of Care (Signed)

## 2024-03-28 NOTE — Progress Notes (Addendum)
 PROGRESS NOTE    Colleen [name]  FMW:[id number] DOB: 07-10-1951 DOA: 03/09/2024 PCP: Leonel Cole, MD   Brief Narrative:    73 year old white female with Anxiety, catatonia,HTN ,asthma Admitted with fall and traumatic SDH.  Significant events: 8/29 admit by neurosurg for right SDH and Cervical Spine Hematoma  8/31 right craniectomy 9/2: Patient became confused over the evening.  CT head showed worsening SDH.  Repeat CT head with stable SDH.  Discussed with neurosurgery, expected postop changes.  No surgical interventions.  EEG negative. 9/3: all drains are out.  9/4: Worsening mentation.  Stat CT head showed slight worsening SDH [9 mm] with persistent midline shift to left, increased cerebral edema with crowding of basilar cisterns increased effacement of fourth ventricle and partial effacement of prepontine cistern and inferior descent of cerebral tonsils.  Status post EVD placement.  Only transdusing. 9/7 RUE DVT; hep gtt no bolus   9/10 transferred to hospitalist service  Assessment & Plan:  Principal Problem:   Subdural hematoma (HCC)   Traumatic SDH ground-level fall brain compression Right craniectomy/EVD placed 9/4 and removed 9/8 -Neurosurgery on board. F/u outpatient in 4 weeks. -Continues Lamictal  150 daily which is her home medication  Leukocytosis : Resolved  Right subclavian axillary DVT due to PICC line -Currently, she is on lovenox . -May switch to oral AC in the next 24 hours.  Treated for aspiration pneumonia Dysphagia secondary to brain damage -Dysphagia 2 diet-previous MBS showed high risk of aspiration repeat 1 done on 9/12 allowed her to have current diet -SLP to continue to revisit on dysphagia 2 diet -Maintaining adequate calories close to 90% of her needs and we will discontinue core track 9/16 -Continues Bentyl  10 3 times daily meals Reglan  5 every 6 as needed nausea  Mild hyponatremia Fluid restrict 1200 cc stop all free water  Recheck this  morning's labs  HTN Stable at this time and controlled on Coreg  12.5  twice daily irbesartan  300 with PRNs of labetalol  and hydralazine   PTA sacral decubitus -Gerhardt Butt cream review wounds as able  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 regimen of BuSpar  7.5 twice daily Risperdal  1 mg daily trazodone  50 at bedtime and Effexor  300 daily It appears that she was on slightly higher doses on transfer from ICU which Adjusted to her home regimen  Hypothyroidism Continue Synthroid  100 mcg daily  Resolved hypokalemia  Disposition: Needs SNF placement   DVT prophylaxis: On lovenox      Code Status: Full Code Family Communication: None at the bedside Status is: Inpatient Remains inpatient appropriate because:SDH, s/p craniotomy    Subjective:  She was sitting in the chair and having breakfast.  Cortrack was removed yesterday.  We spoke about her diagnosis of right arm DVT and why she is on anticoagulation.  She is currently on dysphagia 2 diet.  She is complaining of mild abdominal cramping.  Examination:  General exam: Appears calm and comfortable, s/p craniotomy, EVD incision c/d/l Respiratory system: Clear to auscultation. Respiratory effort normal. Cardiovascular system: S1 & S2 heard, RRR. No JVD, murmurs, rubs, gallops or clicks. No pedal edema. Gastrointestinal system: Abdomen is nondistended, soft and nontender. No organomegaly or masses felt. Normal bowel sounds heard. Central nervous system: Alert and oriented. No focal neurological deficits. Extremities: Symmetric 5 x 5 power. Skin: No rashes, lesions or ulcers        Diet Orders (From admission, onward)     Start     Ordered   03/23/24 1544  DIET DYS  2 Room service appropriate? Yes with Assist; Fluid consistency: Thin  Diet effective now       Comments: Meds in puree  Question Answer Comment  Room service appropriate? Yes with Assist   Fluid consistency: Thin      03/23/24 1544             Objective: Vitals:   03/28/24 0422 03/28/24 0504 03/28/24 0835 03/28/24 0838  BP: (!) 140/76  (!) 142/72 138/76  Pulse: 81  79 80  Resp:   16 20  Temp:   97.6 F (36.4 C) 97.9 F (36.6 C)  TempSrc:   Oral Oral  SpO2:   97% 98%  Weight:  71.1 kg    Height:        Intake/Output Summary (Last 24 hours) at 03/28/2024 0956 Last data filed at 03/28/2024 0549 Gross per 24 hour  Intake 10 ml  Output 350 ml  Net -340 ml   Filed Weights   03/25/24 0500 03/27/24 0434 03/28/24 0504  Weight: 71.8 kg 71.5 kg 71.1 kg    Scheduled Meds:  busPIRone   7.5 mg Oral BID   carvedilol   12.5 mg Oral BID WC   Chlorhexidine  Gluconate Cloth  6 each Topical Daily   dicyclomine   10 mg Oral TID AC & HS   enoxaparin  (LOVENOX ) injection  100 mg Subcutaneous Q24H   feeding supplement  237 mL Oral BID BM   Gerhardt's butt cream   Topical BID   hydrALAZINE   100 mg Oral Q8H   irbesartan   300 mg Oral Daily   lamoTRIgine   150 mg Oral Daily   levothyroxine   100 mcg Oral Q0600   liver oil-zinc  oxide   Topical BID   mouth rinse  15 mL Mouth Rinse 4 times per day   risperiDONE   1 mg Oral QHS   sodium chloride  flush  10-40 mL Intracatheter Q12H   traZODone   50 mg Oral QHS   venlafaxine  XR  300 mg Oral QPC breakfast   Continuous Infusions:  Nutritional status Signs/Symptoms: meal completion < 50% Interventions: Tube feeding, Prostat, MVI Body mass index is 26.89 kg/m.  Data Reviewed:   CBC: Recent Labs  Lab 03/23/24 0219 03/24/24 0220 03/25/24 0557 03/26/24 0635 03/27/24 1118  WBC 5.8 6.7 17.0* 14.8* 8.4  NEUTROABS  --   --  14.7* 12.6* 6.9  HGB 7.9* 8.7* 9.5* 8.0* 8.0*  HCT 23.5* 26.1* 28.4* 23.6* 23.5*  MCV 95.5 96.7 98.3 95.5 95.5  PLT 279 297 337 310 345   Basic Metabolic Panel: Recent Labs  Lab 03/22/24 0539 03/23/24 0219 03/24/24 0220 03/25/24 0557 03/26/24 0635 03/27/24 1118 03/28/24 0212  NA 132* 134* 130* 128* 128* 128* 130*  K 3.4* 3.1* 4.0 3.9 3.8 3.6 3.7  CL 97*  101 100 98 95* 94* 97*  CO2 23 23 23  20* 24 25 24   GLUCOSE 109* 108* 121* 152* 94 88 99  BUN 13 19 19 20 16 17 12   CREATININE 0.60 0.62 0.69 0.67 0.82 0.76 0.71  CALCIUM  8.1* 7.9* 7.9* 8.0* 8.1* 8.2* 8.1*  MG 1.9 1.8 1.9  --   --   --   --   PHOS 3.9  --   --   --   --   --   --    GFR: Estimated Creatinine Clearance: 61.5 mL/min (by C-G formula based on SCr of 0.71 mg/dL). Liver Function Tests: Recent Labs  Lab 03/22/24 0539 03/23/24 0219 03/25/24 0557  AST 17 19 20  ALT 24 22 19   ALKPHOS 102 92 113  BILITOT 0.6 0.4 <0.2  PROT 5.7* 5.2* 5.9*  ALBUMIN 2.7* 2.5* 2.6*   No results for input(s): LIPASE, AMYLASE in the last 168 hours. No results for input(s): AMMONIA in the last 168 hours. Coagulation Profile: No results for input(s): INR, PROTIME in the last 168 hours. Cardiac Enzymes: No results for input(s): CKTOTAL, CKMB, CKMBINDEX, TROPONINI in the last 168 hours. BNP (last 3 results) No results for input(s): PROBNP in the last 8760 hours. HbA1C: No results for input(s): HGBA1C in the last 72 hours. CBG: Recent Labs  Lab 03/27/24 1132 03/27/24 1637 03/27/24 2057 03/28/24 0335 03/28/24 0607  GLUCAP 82 91 120* 94 102*   Lipid Profile: No results for input(s): CHOL, HDL, LDLCALC, TRIG, CHOLHDL, LDLDIRECT in the last 72 hours. Thyroid  Function Tests: No results for input(s): TSH, T4TOTAL, FREET4, T3FREE, THYROIDAB in the last 72 hours. Anemia Panel: No results for input(s): VITAMINB12, FOLATE, FERRITIN, TIBC, IRON, RETICCTPCT in the last 72 hours. Sepsis Labs: No results for input(s): PROCALCITON, LATICACIDVEN in the last 168 hours.  No results found for this or any previous visit (from the past 240 hours).       Radiology Studies: DG CHEST PORT 1 VIEW Result Date: 03/26/2024 CLINICAL DATA:  Pneumonia. EXAM: PORTABLE CHEST 1 VIEW COMPARISON:  03/21/2024. FINDINGS: Low lung volume. There are  heterogeneous opacities at lung bases, which appear grossly similar to the prior study. Differential diagnosis includes underlying atelectasis/scarring or pneumonia. No acute dense consolidation. Bilateral lateral costophrenic angles are clear. No pneumothorax. Normal cardio-mediastinal silhouette. No acute osseous abnormalities. The soft tissues are within normal limits. Enteric tube is seen coursing below the left hemidiaphragm however, the tip is not included in the film. IMPRESSION: *Heterogeneous opacities at lung bases, which appear grossly similar to the prior study. Differential diagnosis includes underlying atelectasis/scarring or pneumonia. No acute dense consolidation. Electronically Signed   By: Ree Molt M.D.   On: 03/26/2024 11:11           LOS: 16 days   Time spent= 41 mins    Deliliah Room, MD [facility]  If 7PM-7AM, please contact night-coverage  03/28/2024, 9:56 AM

## 2024-03-28 NOTE — Plan of Care (Signed)
 Has been sitting up in the chair and had even walked in hallway a couple times.  More alert and talkative today.  Said she is excited because she will get to leave tomorrow.  Said she is ready to get better and get back home.    Problem: Clinical Measurements: Goal: Will remain free from infection Outcome: Progressing   Problem: Activity: Goal: Risk for activity intolerance will decrease Outcome: Progressing   Problem: Nutrition: Goal: Adequate nutrition will be maintained Outcome: Progressing   Problem: Safety: Goal: Ability to remain free from injury will improve Outcome: Progressing   Problem: Skin Integrity: Goal: Risk for impaired skin integrity will decrease Outcome: Progressing

## 2024-03-28 NOTE — Progress Notes (Signed)
 Speech Language Pathology Treatment: Dysphagia;Cognitive-Linguistic  Patient Details Name: Colleen [name] MRN: [id number] DOB: [birth year] Today's Date: 03/28/2024 Time: 8894-8877 SLP Time Calculation (min) (ACUTE ONLY): 17 min  Assessment / Plan / Recommendation Clinical Impression  Skilled therapy session focused on dysphagia and cognitive goals. SLP facilitated session by observing patient with D3/thin textures. Patient with mildly prolonged mastication times, however complete oral clearance with use of liquid wash. No s/sx of aspiration present throughout. Recommend continuation of current diet D2/thin per MBS with continued trials of upgraded textures until patients cognition is stable. SLP targeted cognitive goals through orientation task. Patient reports feeling out of it today asking SLP if she was in the right location. SLP ensured patient she was in the correct room. Patient unable to recall events of the morning, however otherwise aware of current medical situation and hospitalization. Patient left in chair and independently utilized call bell to contact nursing for transfer to [facility].   HPI HPI: Colleen [name] is a 73 yo female presenting to ED 8/29 after a fall with headache, neck pain, and nausea. CT shows R scalp/periorbital hematoma, acute SDH along the R cerebral convexity measuring 8 mm thickness with mass effect and 8 mm leftward midline shift as well as an acute SDH along the R aspect of the falx measuring up to 5 mm in thickness, and an epidural hematoma at the craniocervical junction. MRI shows small dorsal epidural hematoma between C1 vertebrae and occiput. Worsening neurostatus 8/31, taken for R craniotomy for subdural hematoma evacuation. PMH includes HTN, asthma, GERD, anxiety/depression, thyroid  garter s/p total thyroidectomy 2013      SLP Plan  Continue with current plan of care      Recommendations  Diet recommendations: Dysphagia 2 (fine chop);Thin liquid Liquids  provided via: Cup;Straw Medication Administration: Whole meds with puree Supervision: Staff to assist with self feeding;Full supervision/cueing for compensatory strategies Compensations: Minimize environmental distractions;Slow rate;Small sips/bites Postural Changes and/or Swallow Maneuvers: Seated upright 90 degrees        Oral care QID   Frequent or constant Supervision/Assistance Dysphagia, oropharyngeal phase (R13.12);Cognitive communication deficit (R41.841)     Continue with current plan of care     Monee Dembeck M.A., CCC-SLP 03/28/2024, 11:28 AM

## 2024-03-29 DIAGNOSIS — S065XAA Traumatic subdural hemorrhage with loss of consciousness status unknown, initial encounter: Secondary | ICD-10-CM | POA: Diagnosis not present

## 2024-03-29 LAB — GLUCOSE, CAPILLARY
Glucose-Capillary: 83 mg/dL (ref 70–99)
Glucose-Capillary: 81 mg/dL (ref 70–99)
Glucose-Capillary: 101 mg/dL — ABNORMAL HIGH (ref 70–99)
Glucose-Capillary: 103 mg/dL — ABNORMAL HIGH (ref 70–99)
Glucose-Capillary: 79 mg/dL (ref 70–99)

## 2024-03-29 LAB — HEPARIN ANTI-XA: Heparin LMW: 0.1 [IU]/mL

## 2024-03-29 MED ORDER — APIXABAN 5 MG PO TABS
5.0000 mg | ORAL_TABLET | Freq: Two times a day (BID) | ORAL | Status: DC
  Administered 2024-03-29 – 2024-03-30 (×2): 5 mg via ORAL
  Filled 2024-03-29 (×2): qty 1

## 2024-03-29 NOTE — Plan of Care (Signed)
  Problem: Health Behavior/Discharge Planning: Goal: Ability to manage health-related needs will improve Outcome: Progressing   Problem: Clinical Measurements: Goal: Ability to maintain clinical measurements within normal limits will improve Outcome: Progressing Goal: Will remain free from infection Outcome: Progressing Goal: Diagnostic test results will improve Outcome: Progressing Goal: Respiratory complications will improve Outcome: Progressing Goal: Cardiovascular complication will be avoided Outcome: Progressing   Problem: Elimination: Goal: Will not experience complications related to bowel motility Outcome: Progressing Goal: Will not experience complications related to urinary retention Outcome: Progressing   Problem: Pain Managment: Goal: General experience of comfort will improve and/or be controlled Outcome: Progressing   Problem: Safety: Goal: Ability to remain free from injury will improve Outcome: Progressing   Problem: Skin Integrity: Goal: Risk for impaired skin integrity will decrease Outcome: Progressing

## 2024-03-29 NOTE — TOC Progression Note (Signed)
 Transition of Care [facility]) - Progression Note    Patient Details  Name: Colleen [name] MRN: [id number] Date of Birth: 04/24/1951  [facility]) CM/SW Contact  [name], [place] Phone Number: 03/29/2024, 3:39 PM  Clinical Narrative:   CSW spoke with [facility], they do not have a bed available for the patient. CSW spoke with daughter, [name], to discuss other options, and [facility]. [facility] will [facility] available tomorrow. CSW to follow.    Expected Discharge Plan: Skilled Nursing Facility Barriers to Discharge: Continued Medical Work up, SNF Pending bed offer               Expected Discharge Plan and Services In-house Referral: Clinical Social Work Discharge Planning Services: CM Consult Post Acute Care Choice: Skilled Nursing Facility Living arrangements for the past 2 months: Single Family Home                                       Social Drivers of Health (SDOH) Interventions SDOH Screenings   Food Insecurity: No Food Insecurity (03/09/2024)  Housing: Low Risk  (03/09/2024)  Transportation Needs: No Transportation Needs (03/09/2024)  Utilities: Not At Risk (03/09/2024)  Alcohol Screen: Low Risk  (10/10/2021)  Depression (PHQ2-9): High Risk (10/09/2021)  Social Connections: Patient Declined (03/09/2024)  Tobacco Use: Medium Risk (03/09/2024)    Readmission Risk Interventions     No data to display

## 2024-03-29 NOTE — Progress Notes (Signed)
 Physical Therapy Treatment Patient Details Name: Colleen [name] MRN: [id number] DOB: [birth year] Today's Date: 03/29/2024   History of Present Illness Colleen [name] is a 73 yo female presenting to ED 8/29 after a fall with headache, neck pain, and nausea. CT shows R scalp/periorbital hematoma, acute SDH along the R cerebral convexity measuring 8 mm thickness with mass effect and 8 mm leftward midline shift as well as an acute SDH along the R aspect of the falx measuring up to 5 mm in thickness, and an epidural hematoma at the craniocervical junction. MRI shows small dorsal epidural hematoma between C1 vertebrae and occiput. Worsening neurostatus 8/31, taken for R craniotomy for subdural hematoma evacuation. s/p EVD placement 9/4, removed 9/8. New dx LUE DVT 9/7 started on heparin  9/7, MD okayed mobility as of 9/8. PMH includes HTN, asthma, GERD, anxiety/depression, thyroid  garter s/p total thyroidectomy 2013    PT Comments  Pt admitted with above diagnosis. Pt was able to ambulate with RW with min assist and mod cues.  Continues to need mod cues for safety as she continues with right lateral lean and left drift. Will continue to follow acutely.  Pt currently with functional limitations due to the deficits listed below (see PT Problem List). Pt will benefit from acute skilled PT to increase their independence and safety with mobility to allow discharge.       If plan is discharge home, recommend the following: Help with stairs or ramp for entrance;Assist for transportation;Supervision due to cognitive status;A lot of help with walking and/or transfers;A lot of help with bathing/dressing/bathroom   Can travel by private vehicle     Yes  Equipment Recommendations  BSC/3in1;Rolling walker (2 wheels)    Recommendations for Other Services       Precautions / Restrictions Precautions Precautions: Fall Recall of Precautions/Restrictions: Impaired Precaution/Restrictions Comments: SBP <  160 Restrictions Weight Bearing Restrictions Per Provider Order: No     Mobility  Bed Mobility               General bed mobility comments: OOB in chair    Transfers Overall transfer level: Needs assistance Equipment used: Rolling walker (2 wheels) Transfers: Sit to/from Stand Sit to Stand: Contact guard assist           General transfer comment: Close CGA for sit to stand with cues for hand placement to stand and inital posterior lean needing CGA for safety.    Ambulation/Gait Ambulation/Gait assistance: Mod assist Gait Distance (Feet): 100 Feet Assistive device: Rolling walker (2 wheels) Gait Pattern/deviations: Step-through pattern, Decreased stride length, Trunk flexed, Drifts right/left, Step-to pattern Gait velocity: decreased Gait velocity interpretation: <1.8 ft/sec, indicate of risk for recurrent falls   General Gait Details: Pt asked to walk to 3N1/bathroom. Pt able to ambulate with RW to bathroom however did urinate prior to getting to toilet. Pt assisted with cleaning her self and PT changed socks and gown.  Pt needs min assist for toileting. Verbal/visual cueing for larger step lengths. Also cues for posture with ambulation. Cues to stay close to rW and cues when at sink washing hands to get close enough to sink. Pt continues with right lateral lean and left drift but was not mod assist for balance today but continues to need mod cueing at tiems for safety.   Stairs             Wheelchair Mobility     Tilt Bed    Modified Rankin (Stroke Patients Only)  Balance Overall balance assessment: Needs assistance Sitting-balance support: Feet supported, No upper extremity supported Sitting balance-Leahy Scale: Fair     Standing balance support: During functional activity, Bilateral upper extremity supported Standing balance-Leahy Scale: Poor Standing balance comment: Benefits from RW                            Communication  Communication Communication: No apparent difficulties Factors Affecting Communication: Difficulty expressing self  Cognition Arousal: Alert Behavior During Therapy: Flat affect   PT - Cognitive impairments: Safety/Judgement, Problem solving, Sequencing                       PT - Cognition Comments: Cues to avoid obstacles to prevent running into things Following commands: Impaired Following commands impaired: Follows one step commands with increased time    Cueing Cueing Techniques: Verbal cues, Gestural cues, Tactile cues  Exercises Other Exercises Other Exercises: Sit to stands practice x 10 working on balance and decreasing cues for hand placement.    General Comments General comments (skin integrity, edema, etc.): VSS      Pertinent Vitals/Pain Pain Assessment Pain Assessment: Faces Faces Pain Scale: Hurts little more Pain Location: generalized Pain Descriptors / Indicators: Discomfort Pain Intervention(s): Limited activity within patient's tolerance, Monitored during session, Repositioned    Home Living                          Prior Function            PT Goals (current goals can now be found in the care plan section) Acute Rehab PT Goals Patient Stated Goal: did not state Progress towards PT goals: Progressing toward goals    Frequency    Min 2X/week      PT Plan      Co-evaluation              AM-PAC PT 6 Clicks Mobility   Outcome Measure  Help needed turning from your back to your side while in a flat bed without using bedrails?: A Little Help needed moving from lying on your back to sitting on the side of a flat bed without using bedrails?: A Little Help needed moving to and from a bed to a chair (including a wheelchair)?: A Little Help needed standing up from a chair using your arms (e.g., wheelchair or bedside chair)?: A Lot Help needed to walk in hospital room?: A Lot Help needed climbing 3-5 steps with a railing? :  Total 6 Click Score: 14    End of Session Equipment Utilized During Treatment: Gait belt Activity Tolerance: Patient tolerated treatment well Patient left: in chair;with call bell/phone within reach;with chair alarm set Nurse Communication: Mobility status PT Visit Diagnosis: Unsteadiness on feet (R26.81);Difficulty in walking, not elsewhere classified (R26.2)     Time: 9066-9042 PT Time Calculation (min) (ACUTE ONLY): 24 min  Charges:    $Gait Training: 8-22 mins $Therapeutic Activity: 8-22 mins PT General Charges $$ ACUTE PT VISIT: 1 Visit                     Carolan Avedisian M,PT Acute Rehab Services [phone]    Stephane JULIANNA Bevel 03/29/2024, 1:44 PM

## 2024-03-29 NOTE — Progress Notes (Signed)
 PROGRESS NOTE    Colleen [name]  FMW:[id number] DOB: 02-10-1951 DOA: 03/09/2024 PCP: Leonel Cole, MD  Chief Complaint  Patient presents with   Fall    Brief Narrative:   73 year old white female with Anxiety, catatonia,HTN ,asthma Admitted with fall and traumatic SDH.   Significant events: 8/29 admit by neurosurg for right SDH and Cervical Spine Hematoma  8/31 right craniectomy 9/2: Patient became confused over the evening.  CT head showed worsening SDH.  Repeat CT head with stable SDH.  Discussed with neurosurgery, expected postop changes.  No surgical interventions.  EEG negative. 9/3: all drains are out.  9/4: Worsening mentation.  Stat CT head showed slight worsening SDH [9 mm] with persistent midline shift to left, increased cerebral edema with crowding of basilar cisterns increased effacement of fourth ventricle and partial effacement of prepontine cistern and inferior descent of cerebral tonsils.  Status post EVD placement.  Only transdusing. 9/7 RUE DVT; hep gtt no bolus    9/10 transferred to hospitalist service  Assessment & Plan:   Principal Problem:   Subdural hematoma (HCC)  Traumatic SDH  ground-level fall  brain compression S/p Right craniectomy/EVD placed 9/4 and removed 9/8 -Neurosurgery has signed off as of 9/16, they recommend follow up in 4 weeks - goal normal sodium, SBP <160, activity as tolerated, diet as tolerated -Continues Lamictal  150 daily which is her home medication   Leukocytosis : follow labs   Right subclavian axillary DVT due to PICC line -will transition to eliquis    Treated for aspiration pneumonia Dysphagia secondary to brain damage -Dysphagia 2 diet-previous MBS showed high risk of aspiration repeat 1 done on 9/12 allowed her to have current diet -SLP to continue to revisit on dysphagia 2 diet -Maintaining adequate calories close to 90% of her needs and we will discontinue core track 9/16 -Continues Bentyl  10 3 times daily  meals Reglan  5 every 6 as needed nausea   Mild hyponatremia Fluid restrict 1200 cc stop all free water  Recheck this morning's labs   HTN Stable at this time and controlled on Coreg  12.5  twice daily irbesartan  300 with PRNs of labetalol  and hydralazine    PTA sacral decubitus -Gerhardt Butt cream review wounds as able   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 regimen of BuSpar  7.5 twice daily Risperdal  1 mg daily trazodone  50 at bedtime and Effexor  300 daily It appears that she was on slightly higher doses on transfer from ICU which Adjusted to her home regimen   Hypothyroidism Continue Synthroid  100 mcg daily   Resolved hypokalemia    DVT prophylaxis: eliquis  Code Status: full Family Communication: none Disposition:   Status is: Inpatient Remains inpatient appropriate because: awaiting safe disposition   Consultants:  Neurosurgery PCCM  Procedures:  See significant events  Antimicrobials:  Anti-infectives (From admission, onward)    Start     Dose/Rate Route Frequency Ordered Stop   03/21/24 1030  Ampicillin -Sulbactam (UNASYN ) 3 g in sodium chloride  0.9 % 100 mL IVPB        3 g 200 mL/hr over 30 Minutes Intravenous Every 6 hours 03/21/24 0940 03/26/24 0536   03/15/24 2015  ceFAZolin  (ANCEF ) IVPB 1 g/50 mL premix        1 g 100 mL/hr over 30 Minutes Intravenous  Once 03/15/24 1923 03/15/24 2011   03/12/24 0600  ceFAZolin  (ANCEF ) IVPB 2g/100 mL premix        2 g 200 mL/hr over 30 Minutes Intravenous Every 8 hours 03/12/24 0224  03/13/24 0002   03/11/24 2151  ceFAZolin  (ANCEF ) IVPB 2g/100 mL premix  Status:  Discontinued        2 g 200 mL/hr over 30 Minutes Intravenous 30 min pre-op 03/11/24 2151 03/12/24 0205       Subjective: No new complaints  Objective: Vitals:   03/29/24 1130 03/29/24 1450 03/29/24 1553 03/29/24 1759  BP: 114/64 124/70 (!) 124/52 130/70  Pulse: 69  79 76  Resp: 16  18   Temp: 98 F (36.7 C)  97.6 F (36.4 C)   TempSrc: Oral   Oral   SpO2: 99%  96%   Weight:      Height:        Intake/Output Summary (Last 24 hours) at 03/29/2024 1900 Last data filed at 03/29/2024 0540 Gross per 24 hour  Intake 10 ml  Output 600 ml  Net -590 ml   Filed Weights   03/25/24 0500 03/27/24 0434 03/28/24 0504  Weight: 71.8 kg 71.5 kg 71.1 kg    Examination:  General exam: Appears calm and comfortable  Respiratory system: unlabored Cardiovascular system: RRR Central nervous system: Alert. No focal neurological deficits. Extremities: no LEE   Data Reviewed: I have personally reviewed following labs and imaging studies  CBC: Recent Labs  Lab 03/23/24 0219 03/24/24 0220 03/25/24 0557 03/26/24 0635 03/27/24 1118  WBC 5.8 6.7 17.0* 14.8* 8.4  NEUTROABS  --   --  14.7* 12.6* 6.9  HGB 7.9* 8.7* 9.5* 8.0* 8.0*  HCT 23.5* 26.1* 28.4* 23.6* 23.5*  MCV 95.5 96.7 98.3 95.5 95.5  PLT 279 297 337 310 345    Basic Metabolic Panel: Recent Labs  Lab 03/23/24 0219 03/24/24 0220 03/25/24 0557 03/26/24 0635 03/27/24 1118 03/28/24 0212  NA 134* 130* 128* 128* 128* 130*  K 3.1* 4.0 3.9 3.8 3.6 3.7  CL 101 100 98 95* 94* 97*  CO2 23 23 20* 24 25 24   GLUCOSE 108* 121* 152* 94 88 99  BUN 19 19 20 16 17 12   CREATININE 0.62 0.69 0.67 0.82 0.76 0.71  CALCIUM  7.9* 7.9* 8.0* 8.1* 8.2* 8.1*  MG 1.8 1.9  --   --   --   --     GFR: Estimated Creatinine Clearance: 61.5 mL/min (by C-G formula based on SCr of 0.71 mg/dL).  Liver Function Tests: Recent Labs  Lab 03/23/24 0219 03/25/24 0557  AST 19 20  ALT 22 19  ALKPHOS 92 113  BILITOT 0.4 <0.2  PROT 5.2* 5.9*  ALBUMIN 2.5* 2.6*    CBG: Recent Labs  Lab 03/28/24 2100 03/29/24 0625 03/29/24 0748 03/29/24 1128 03/29/24 1550  GLUCAP 90 83 81 101* 103*     No results found for this or any previous visit (from the past 240 hours).       Radiology Studies: No results found.      Scheduled Meds:  apixaban   5 mg Oral BID   busPIRone   7.5 mg Oral BID    carvedilol   12.5 mg Oral BID WC   Chlorhexidine  Gluconate Cloth  6 each Topical Daily   dicyclomine   10 mg Oral TID AC & HS   feeding supplement  237 mL Oral BID BM   Gerhardt's butt cream   Topical BID   hydrALAZINE   100 mg Oral Q8H   irbesartan   300 mg Oral Daily   lamoTRIgine   150 mg Oral Daily   levothyroxine   100 mcg Oral Q0600   liver oil-zinc  oxide   Topical BID  mouth rinse  15 mL Mouth Rinse 4 times per day   risperiDONE   1 mg Oral QHS   sodium chloride  flush  10-40 mL Intracatheter Q12H   traZODone   50 mg Oral QHS   venlafaxine  XR  300 mg Oral QPC breakfast   Continuous Infusions:   LOS: 17 days    Time spent: over 30 min     Meliton Monte, MD [facility]   To contact the attending provider between 7A-7P or the covering provider during after hours 7P-7A, please log into the web site [email or web address] and access using universal [place] password for that web site. If you do not have the password, please call the hospital operator.  03/29/2024, 7:00 PM

## 2024-03-29 NOTE — Plan of Care (Signed)
 Has to be cued often to eat.  Will say she has eaten all of her food when she will have taken only a bit or two.  Will say she is not hungry but if fed she will eat 75-100% of her meal and is thankful for someone feeding her.  Noticed if staff stays at the bedside and reminds her to take bites that she will feed herself.     Problem: Clinical Measurements: Goal: Will remain free from infection Outcome: Progressing   Problem: Clinical Measurements: Goal: Diagnostic test results will improve Outcome: Progressing   Problem: Clinical Measurements: Goal: Respiratory complications will improve Outcome: Progressing   Problem: Activity: Goal: Risk for activity intolerance will decrease Outcome: Progressing   Problem: Nutrition: Goal: Adequate nutrition will be maintained Outcome: Progressing   Problem: Coping: Goal: Level of anxiety will decrease Outcome: Progressing

## 2024-03-30 DIAGNOSIS — S065XAA Traumatic subdural hemorrhage with loss of consciousness status unknown, initial encounter: Secondary | ICD-10-CM | POA: Diagnosis not present

## 2024-03-30 DIAGNOSIS — S064XAA Epidural hemorrhage with loss of consciousness status unknown, initial encounter: Secondary | ICD-10-CM | POA: Diagnosis not present

## 2024-03-30 DIAGNOSIS — S51811A Laceration without foreign body of right forearm, initial encounter: Secondary | ICD-10-CM

## 2024-03-30 DIAGNOSIS — W450XXA Nail entering through skin, initial encounter: Secondary | ICD-10-CM

## 2024-03-30 LAB — GLUCOSE, CAPILLARY: Glucose-Capillary: 95 mg/dL (ref 70–99)

## 2024-03-30 MED ORDER — HYDRALAZINE HCL 100 MG PO TABS: 100.0000 mg | ORAL_TABLET | Freq: Three times a day (TID) | ORAL | Status: AC

## 2024-03-30 MED ORDER — IPRATROPIUM-ALBUTEROL 0.5-2.5 (3) MG/3ML IN SOLN: 3.0000 mL | Freq: Four times a day (QID) | RESPIRATORY_TRACT | Status: AC | PRN

## 2024-03-30 MED ORDER — ENSURE PLUS HIGH PROTEIN PO LIQD: 237.0000 mL | Freq: Two times a day (BID) | ORAL | Status: AC

## 2024-03-30 MED ORDER — APIXABAN 5 MG PO TABS: 5.0000 mg | ORAL_TABLET | Freq: Two times a day (BID) | ORAL | Status: AC

## 2024-03-30 MED ORDER — CARVEDILOL 12.5 MG PO TABS: 12.5000 mg | ORAL_TABLET | Freq: Two times a day (BID) | ORAL | Status: AC

## 2024-03-30 MED ORDER — DICYCLOMINE HCL 10 MG PO CAPS: 10.0000 mg | ORAL_CAPSULE | Freq: Three times a day (TID) | ORAL | Status: AC

## 2024-03-30 MED ORDER — IRBESARTAN 300 MG PO TABS: 300.0000 mg | ORAL_TABLET | Freq: Every day | ORAL | Status: AC

## 2024-03-30 NOTE — Progress Notes (Addendum)
 Patient being discharged to [facility], Room 206, Phone # [phone]  Called [facility] to provide report on patient prior to discharge, spoke with/ Dorothe.   Nurse was unavailable at this time to take report as on break, Dorothe took message and advised will have nurse call back to obtain report.   1525 - no return call, called [facility] back to give report. Spoke w/ Karyl, no answer when Karyl tried to transfer me to receiving nurse.   Left another message requesting call back.  Informed Karyl this was second attempt and second message left, Karyl advised will walk the info back to the nurse.   1555 -- no return call.  Called admissions coordinator Shona at [phone].  Shona advised will Manufacturing engineer of nursing.   1625 - [facility] here to transport patient.  Still no return call from [facility].   RN gave her phone number and name to [facility] to provide to [facility] for them to call and obtain report on patient.  1710 - received call from Maxine at [facility] upon patient arrival to SNF.  Report given to Maxine.

## 2024-03-30 NOTE — Discharge Summary (Addendum)
 Physician Discharge Summary   Patient: Colleen [name] MRN: [id number] DOB: [birth year]  Admit date:     03/09/2024  Discharge date: 03/30/24  Discharge Physician: Sabas GORMAN Brod   PCP: Leonel Cole, MD   Recommendations at discharge:   Check BMP in 3 days on Monday 9/22 Continue Eliquis  for 3 months Follow-up neurosurgery in 4 weeks  Discharge Diagnoses: Principal Problem:   Subdural hematoma (HCC)  Resolved Problems:   * Colleen resolved hospital problems. *  Hospital Course: 73 year old white female with Anxiety, catatonia,HTN ,asthma Admitted with fall and traumatic SDH.   Significant events: 8/29 admit by neurosurg for right SDH and Cervical Spine Hematoma  8/31 right craniectomy 9/2: Patient became confused over the evening.  CT head showed worsening SDH.  Repeat CT head with stable SDH.  Discussed with neurosurgery, expected postop changes.  Colleen surgical interventions.  EEG negative. 9/3: all drains are out.  9/4: Worsening mentation.  Stat CT head showed slight worsening SDH [9 mm] with persistent midline shift to left, increased cerebral edema with crowding of basilar cisterns increased effacement of fourth ventricle and partial effacement of prepontine cistern and inferior descent of cerebral tonsils.  Status post EVD placement.  Only transdusing. 9/7 RUE DVT; hep gtt Colleen bolus     Assessment and Plan:  Traumatic SDH  ground-level fall  brain compression S/p Right craniectomy/EVD placed 9/4 and removed 9/8 -Neurosurgery has signed off as of 9/16, they recommend follow up in 4 weeks - goal normal sodium, SBP <160, activity as tolerated, diet as tolerated -Continues Lamictal  150 daily which is her home medication   Leukocytosis : Resolved.  Likely reactive.   Right subclavian axillary DVT due to PICC line - Started on heparin , transition to p.o. Eliquis .   - Needs to be on Eliquis  for at least 3 months.    Treated for aspiration pneumonia Dysphagia secondary to  brain damage -Dysphagia 2 diet-previous MBS showed high risk of aspiration repeat 1 done on 9/12 allowed her to have current diet -SLP to continue to revisit on dysphagia 2 diet -Maintaining adequate calories close to 90% of her needs and we will discontinue core track 9/16 -Continues Bentyl  10 3 times daily meals    Mild hyponatremia Sodium has improved to 130. -Likely in setting of SDH as above -Check BMP in 3 days on 9/22   HTN Stable at this time and controlled on Coreg  12.5  twice daily irbesartan  300 milligram along with hydralazine  100 mg p.o. 3 times daily - Goal to keep SBP less than 160   PTA sacral decubitus -Gerhardt Butt cream review wounds as able   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 regimen of BuSpar  7.5 twice daily Risperdal  1 mg daily trazodone  50 at bedtime and Effexor  300 daily    Hypothyroidism Continue Synthroid  100 mcg daily   Resolved hypokalemia       Consultants:  Procedures performed:  Disposition: Home Diet recommendation:  Discharge Diet Orders (From admission, onward)     Start     Ordered   03/30/24 0000  Diet - low sodium heart healthy        03/30/24 1316           Dysphagia type 2 thin Liquid DISCHARGE MEDICATION: Allergies as of 03/30/2024       Reactions   Amlodipine  Other (See Comments)   Flushing, tingling in face, sore throat (Norvasc )   Cyclobenzaprine Hcl Other (See Comments)   Seizures   Aripiprazole Other (  See Comments)   Cymbalta  [duloxetine  Hcl] Other (See Comments)   Made the depression worse   Tape Rash   Blisters        Medication List     STOP taking these medications    metoprolol  succinate 25 MG 24 hr tablet Commonly known as: TOPROL -XL   olmesartan 20 MG tablet Commonly known as: BENICAR       TAKE these medications    acyclovir  400 MG tablet Commonly known as: ZOVIRAX  Take 400 mg by mouth 3 (three) times daily as needed (Flare ups).   apixaban  5 MG Tabs tablet Commonly  known as: ELIQUIS  Take 1 tablet (5 mg total) by mouth 2 (two) times daily.   busPIRone  7.5 MG tablet Commonly known as: BUSPAR  Take 7.5 mg by mouth 2 (two) times daily.   carvedilol  12.5 MG tablet Commonly known as: COREG  Take 1 tablet (12.5 mg total) by mouth 2 (two) times daily with a meal.   dicyclomine  10 MG capsule Commonly known as: BENTYL  Take 1 capsule (10 mg total) by mouth 4 (four) times daily -  before meals and at bedtime.   feeding supplement Liqd Take 237 mLs by mouth 2 (two) times daily between meals.   hydrALAZINE  100 MG tablet Commonly known as: APRESOLINE  Take 1 tablet (100 mg total) by mouth every 8 (eight) hours.   ipratropium-albuterol  0.5-2.5 (3) MG/3ML Soln Commonly known as: DUONEB Take 3 mLs by nebulization every 6 (six) hours as needed.   irbesartan  300 MG tablet Commonly known as: AVAPRO  Take 1 tablet (300 mg total) by mouth daily. Start taking on: March 31, 2024   lamoTRIgine  150 MG tablet Commonly known as: LAMICTAL  Take 150 mg by mouth daily.   levothyroxine  100 MCG tablet Commonly known as: SYNTHROID  Take 1 tablet (100 mcg total) by mouth daily.   risperiDONE  3 MG tablet Commonly known as: RISPERDAL  Take 3 mg by mouth at bedtime.   traZODone  50 MG tablet Commonly known as: DESYREL  Take 50 mg by mouth at bedtime.   venlafaxine  XR 150 MG 24 hr capsule Commonly known as: EFFEXOR -XR Take 300 mg by mouth daily.        Contact information for follow-up providers     Darnella Dorn SAUNDERS, MD. Call in 4 week(s).   Specialty: Neurosurgery Contact information: [address] [place], [address] [place] [address] [phone]              Contact information for after-discharge care     Destination     [facility] .   Service: Skilled Nursing Contact information: [address] [place]  [address] [phone]                    Discharge Exam: Fredricka Weights   03/27/24 0434 03/28/24  0504 03/30/24 0540  Weight: 71.5 kg 71.1 kg 67.9 kg   Appears in Colleen acute distress S1-S2, regular, Colleen murmur auscultated Abdomen is soft, nontender Neuro-moving all extremities  Condition at discharge: good  The results of significant diagnostics from this hospitalization (including imaging, microbiology, ancillary and laboratory) are listed below for reference.   Imaging Studies: DG CHEST PORT 1 VIEW Result Date: 03/26/2024 CLINICAL DATA:  Pneumonia. EXAM: PORTABLE CHEST 1 VIEW COMPARISON:  03/21/2024. FINDINGS: Low lung volume. There are heterogeneous opacities at lung bases, which appear grossly similar to the prior study. Differential diagnosis includes underlying atelectasis/scarring or pneumonia. Colleen acute dense consolidation. Bilateral lateral costophrenic angles are clear. Colleen pneumothorax. Normal cardio-mediastinal  silhouette. Colleen acute osseous abnormalities. The soft tissues are within normal limits. Enteric tube is seen coursing below the left hemidiaphragm however, the tip is not included in the film. IMPRESSION: *Heterogeneous opacities at lung bases, which appear grossly similar to the prior study. Differential diagnosis includes underlying atelectasis/scarring or pneumonia. Colleen acute dense consolidation. Electronically Signed   By: Ree Molt M.D.   On: 03/26/2024 11:11   DG Abd 1 View Result Date: 03/24/2024 EXAM: 1 VIEW XRAY OF THE ABDOMEN 03/24/2024 08:37:00 AM COMPARISON: None available. CLINICAL HISTORY: Acute periumbilical pain FINDINGS: LINES, TUBES AND DEVICES: Weighted tip enteric tube in place with tip in gastric antrum. BOWEL: Contrast is present in the terminal ileum and throughout the colon. Bowel gas pattern is normal. SOFT TISSUES: Colleen opaque urinary calculi. BONES: Colleen acute osseous abnormality. IMPRESSION: 1. Colleen bowel obstruction. Electronically signed by: Lonni Necessary MD 03/24/2024 12:27 PM EDT RP Workstation: [id number]   DG Swallowing Func-Speech  Pathology Result Date: 03/23/2024 Table formatting from the original result was not included. Modified Barium Swallow Study Patient Details Name: Colleen [name] MRN: [id number] Date of Birth: March 28, 1951 Today's Date: 03/23/2024 HPI/PMH: HPI: POPPI SCANTLING is a 73 yo female presenting to ED 8/29 after a fall with headache, neck pain, and nausea. CT shows R scalp/periorbital hematoma, acute SDH along the R cerebral convexity measuring 8 mm thickness with mass effect and 8 mm leftward midline shift as well as an acute SDH along the R aspect of the falx measuring up to 5 mm in thickness, and an epidural hematoma at the craniocervical junction. MRI shows small dorsal epidural hematoma between C1 vertebrae and occiput. Worsening neurostatus 8/31, taken for R craniotomy for subdural hematoma evacuation. PMH includes HTN, asthma, GERD, anxiety/depression, thyroid  garter s/p total thyroidectomy 2013 Clinical Impression: Clinical Impression: Pt shows improvement from most recent MBS, most likely attributable to her improved alertness and mentation. Her oral phase is more swift and controlled overall, although she does have slow mastication and incomplete clearance of the barium tablet. Pharyngeally, she still has reduced anterior hyoid excursion, laryngeal vestibule closure, and epiglottic inversion, which contribute to trace penetration with thin and nectar thick liquids. Although it does not always clear immediately, it consistently clears with subsequent swallows or spontaneous throat clears. Pharyngeal residue is also less overally, with small amounts primarily on the base of tongue and in the valleculae, but they do not enter into the airway. Recommend that pt resume a diet of Dys 2 solids and thin liquids, although she will likely need careful assistance during meals. If she is not fully allert, she is likely to be at risk for aspiration as was noted on previous MBS. Factors that may increase risk of adverse event in  presence of aspiration Noe & Lianne 2021): Factors that may increase risk of adverse event in presence of aspiration Noe & Lianne 2021): Poor general health and/or compromised immunity; Reduced cognitive function; Limited mobility; Frail or deconditioned; Weak cough; Presence of tubes (ETT, trach, NG, etc.) Recommendations/Plan: Swallowing Evaluation Recommendations Swallowing Evaluation Recommendations Recommendations: PO diet PO Diet Recommendation: Dysphagia 2 (Finely chopped); Thin liquids (Level 0) Liquid Administration via: Cup; Straw Medication Administration: Whole meds with puree Supervision: Staff to assist with self-feeding; Full supervision/cueing for swallowing strategies Swallowing strategies  : Minimize environmental distractions; Slow rate; Small bites/sips Postural changes: Position pt fully upright for meals; Stay upright 30-60 min after meals Oral care recommendations: Oral care BID (2x/day) Treatment Plan Treatment Plan Treatment recommendations: Therapy as outlined  in treatment plan below Follow-up recommendations: Skilled nursing-short term rehab (<3 hours/day) Functional status assessment: Patient has had a recent decline in their functional status and demonstrates the ability to make significant improvements in function in a reasonable and predictable amount of time. Treatment frequency: Min 2x/week Treatment duration: 2 weeks Interventions: Aspiration precaution training; Patient/family education; Trials of upgraded texture/liquids; Diet toleration management by SLP Recommendations Recommendations for follow up therapy are one component of a multi-disciplinary discharge planning process, led by the attending physician.  Recommendations may be updated based on patient status, additional functional criteria and insurance authorization. Assessment: Orofacial Exam: Orofacial Exam Oral Cavity: Oral Hygiene: WFL Oral Cavity - Dentition: Adequate natural dentition Orofacial Anatomy: WFL  Oral Motor/Sensory Function: WFL Anatomy: Anatomy: Suspected cervical osteophytes Boluses Administered: Boluses Administered Boluses Administered: Thin liquids (Level 0); Mildly thick liquids (Level 2, nectar thick); Moderately thick liquids (Level 3, honey thick); Puree; Solid  Oral Impairment Domain: Oral Impairment Domain Lip Closure: Colleen labial escape Tongue control during bolus hold: Cohesive bolus between tongue to palatal seal Bolus preparation/mastication: Slow prolonged chewing/mashing with complete recollection Bolus transport/lingual motion: Brisk tongue motion Oral residue: Trace residue lining oral structures Location of oral residue : Tongue; Palate Initiation of pharyngeal swallow : Valleculae  Pharyngeal Impairment Domain: Pharyngeal Impairment Domain Soft palate elevation: Colleen bolus between soft palate (SP)/pharyngeal wall (PW) Laryngeal elevation: Complete superior movement of thyroid  cartilage with complete approximation of arytenoids to epiglottic petiole Anterior hyoid excursion: Partial anterior movement Epiglottic movement: Partial inversion Laryngeal vestibule closure: Incomplete, narrow column air/contrast in laryngeal vestibule Pharyngeal stripping wave : Present - complete Pharyngeal contraction (A/P view only): N/A Pharyngoesophageal segment opening: Complete distension and complete duration, Colleen obstruction of flow Tongue base retraction: Trace column of contrast or air between tongue base and PPW Pharyngeal residue: Collection of residue within or on pharyngeal structures Location of pharyngeal residue: Tongue base; Valleculae  Esophageal Impairment Domain: Esophageal Impairment Domain Esophageal clearance upright position: Esophageal retention with retrograde flow below pharyngoesophageal segment (PES) Pill: Pill Consistency administered: Thin liquids (Level 0) Thin liquids (Level 0): Impaired (see clinical impressions) Penetration/Aspiration Scale Score: Penetration/Aspiration Scale  Score 1.  Material does not enter airway: Moderately thick liquids (Level 3, honey thick); Puree; Solid; Pill 3.  Material enters airway, remains ABOVE vocal cords and not ejected out: Thin liquids (Level 0); Mildly thick liquids (Level 2, nectar thick) Compensatory Strategies: Colleen data recorded  General Information: Caregiver present: Colleen  Diet Prior to this Study: NPO; Cortrak/Small bore NG tube   Temperature : Normal   Respiratory Status: WFL   Supplemental O2: None (Room air)   History of Recent Intubation: Colleen  Behavior/Cognition: Alert; Cooperative; Requires cueing Self-Feeding Abilities: Needs assist with self-feeding Baseline vocal quality/speech: Normal Volitional Cough: Able to elicit Volitional Swallow: Able to elicit Exam Limitations: Colleen limitations Goal Planning: Prognosis for improved oropharyngeal function: Good Barriers to Reach Goals: Cognitive deficits Colleen data recorded Patient/Family Stated Goal: none stated Consulted and agree with results and recommendations: Patient; Physician; Dietitian Pain: Pain Assessment Pain Assessment: Faces Faces Pain Scale: 0 End of Session: Start Time:SLP Start Time (ACUTE ONLY): 1439 Stop Time: SLP Stop Time (ACUTE ONLY): 1454 Time Calculation:SLP Time Calculation (min) (ACUTE ONLY): 15 min Charges: SLP Evaluations $ SLP Speech Visit: 1 Visit SLP Evaluations $MBS Swallow: 1 Procedure $Swallowing Treatment: 1 Procedure SLP visit diagnosis: SLP Visit Diagnosis: Dysphagia, oropharyngeal phase (R13.12) Past Medical History: Past Medical History: Diagnosis Date  Allergy   Anxiety   Arrhythmia  does not see cardiologist, sees pcp, Dr. 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   Heartburn   Herpes   on medication for outbreaks only  Hypertension   Seizures (HCC)   MEDICATION INDUCED  Urinary tract infection   hx of Past Surgical History: Past Surgical History: Procedure Laterality Date  CESAREAN SECTION    x 2  COLONOSCOPY    CRANIOTOMY Right 03/11/2024  Procedure: CRANIOTOMY HEMATOMA EVACUATION SUBDURAL;  Surgeon: Darnella Dorn SAUNDERS, MD;  Location: [facility] OR;  Service: Neurosurgery;  Laterality: Right;  ELBOW SURGERY    right  KNEE ARTHROSCOPY    right  PILONIDAL CYST EXCISION    spinal cystectomy    where pilonidal cyst was  THYROIDECTOMY  04/07/2012  Procedure: THYROIDECTOMY;  Surgeon: Sherlean JINNY Laughter, MD;  Location: [facility] OR;  Service: General;  Laterality: N/A;   Leita SAILOR., M.A. CCC-SLP Acute Rehabilitation Services Office: [phone] Secure chat preferred 03/23/2024, 3:49 PM  DG Swallowing Func-Speech Pathology Result Date: 03/21/2024 Table formatting from the original result was not included. Modified Barium Swallow Study Patient Details Name: Syann Cupples MRN: [id number] Date of Birth: 09-26-1950 Today's Date: 03/21/2024 HPI/PMH: HPI: SHYNICE SIGEL is a 73 yo female presenting to ED 8/29 after a fall with headache, neck pain, and nausea. CT shows R scalp/periorbital hematoma, acute SDH along the R cerebral convexity measuring 8 mm thickness with mass effect and 8 mm leftward midline shift as well as an acute SDH along the R aspect of the falx measuring up to 5 mm in thickness, and an epidural hematoma at the craniocervical junction. MRI shows small dorsal epidural hematoma between C1 vertebrae and occiput. Worsening neurostatus 8/31, taken for R craniotomy for subdural hematoma evacuation. PMH includes HTN, asthma, GERD, anxiety/depression, thyroid  garter s/p total thyroidectomy 2013 Clinical Impression: Pt exhibits moderate oropharyngeal dysphagia suspected to be further exacerbated by pt's mentation. Her  oral phase is characterized by slow oral transit and incomplete labial seal, leading to gross anterior loss. Laryngeal elevation is complete but anterior hyoid movement and epiglottic deflection are significantly reduced, prohibiting laryngeal vestibule closure. There is consistent penetration during the swallow with all liquids that progresses to the vocal folds as the volume increases when mixed with residuals from the base of tongue and valleculae (PAS 5). She is sensate, initiating throat clearance or multiple swallows to clear the laryngeal vestibule but cannot clear the pharyngeal residue so frank penetration is cyclical. Trace aspiration occurs with thin liquids and pt initiates a cough response but it is ineffective (PAS 7). Her presentation overall appears different from previous sessions with increased effort noted clinically during earlier visit and CXR showing increased R>L opacities. Recommend NPO except ice chips in moderation and medications given via Cortrak. SLP will f/u to target use of strategies and assess readiness to resume PO diet. Factors that may increase risk of adverse event in presence of aspiration Noe & Lianne 2021): Factors that may increase risk of adverse event in presence of aspiration Noe & Lianne 2021): Poor general health and/or compromised immunity; Reduced cognitive function; Limited mobility; Frail or deconditioned; Weak cough; Presence  of tubes (ETT, trach, NG, etc.) Recommendations/Plan: Swallowing Evaluation Recommendations Swallowing Evaluation Recommendations Recommendations: NPO; Ice chips PRN after oral care Medication Administration: Via alternative means Oral care recommendations: Oral care QID (4x/day); Oral care before ice chips/water Treatment Plan Treatment Plan Treatment recommendations: Therapy as outlined in treatment plan below Follow-up recommendations: Skilled nursing-short term rehab (<3 hours/day) Functional status assessment: Patient has had a  recent decline in their functional status and demonstrates the ability to make significant improvements in function in a reasonable and predictable amount of time. Treatment frequency: Min 2x/week Treatment duration: 2 weeks Interventions: Aspiration precaution training; Patient/family education; Trials of upgraded texture/liquids; Diet toleration management by SLP Recommendations Recommendations for follow up therapy are one component of a multi-disciplinary discharge planning process, led by the attending physician.  Recommendations may be updated based on patient status, additional functional criteria and insurance authorization. Assessment: Orofacial Exam: Orofacial Exam Oral Cavity: Oral Hygiene: WFL Oral Cavity - Dentition: Adequate natural dentition Orofacial Anatomy: WFL Oral Motor/Sensory Function: WFL Anatomy: Anatomy: Suspected cervical osteophytes Boluses Administered: Boluses Administered Boluses Administered: Thin liquids (Level 0); Mildly thick liquids (Level 2, nectar thick); Moderately thick liquids (Level 3, honey thick); Puree  Oral Impairment Domain: Oral Impairment Domain Lip Closure: Escape beyond mid-chin Tongue control during bolus hold: Posterior escape of less than half of bolus Bolus transport/lingual motion: Delayed initiation of tongue motion (oral holding) Oral residue: Trace residue lining oral structures Location of oral residue : Tongue; Palate Initiation of pharyngeal swallow : Pyriform sinuses  Pharyngeal Impairment Domain: Pharyngeal Impairment Domain Soft palate elevation: Trace column of contrast or air between SP and PW Laryngeal elevation: Complete superior movement of thyroid  cartilage with complete approximation of arytenoids to epiglottic petiole Anterior hyoid excursion: Partial anterior movement Epiglottic movement: Partial inversion Laryngeal vestibule closure: Incomplete, narrow column air/contrast in laryngeal vestibule Pharyngeal stripping wave : Present - diminished  Pharyngeal contraction (A/P view only): N/A Pharyngoesophageal segment opening: Partial distention/partial duration, partial obstruction of flow Tongue base retraction: Narrow column of contrast or air between tongue base and PPW Pharyngeal residue: Collection of residue within or on pharyngeal structures Location of pharyngeal residue: Valleculae; Pharyngeal wall; Aryepiglottic folds; Tongue base  Esophageal Impairment Domain: Colleen data recorded Pill: Colleen data recorded Penetration/Aspiration Scale Score: Penetration/Aspiration Scale Score 5.  Material enters airway, CONTACTS cords and not ejected out: Mildly thick liquids (Level 2, nectar thick); Moderately thick liquids (Level 3, honey thick); Puree 7.  Material enters airway, passes BELOW cords and not ejected out despite cough attempt by patient: Thin liquids (Level 0) Compensatory Strategies: Compensatory Strategies Compensatory strategies: Yes Effortful swallow: Ineffective Ineffective Effortful Swallow: Thin liquid (Level 0); Mildly thick liquid (Level 2, nectar thick); Moderately thick liquid (Level 3, honey thick)   General Information: Caregiver present: Colleen  Diet Prior to this Study: Regular; Thin liquids (Level 0); Cortrak/Small bore NG tube   Temperature : Normal   Respiratory Status: WFL   Supplemental O2: None (Room air)   History of Recent Intubation: Colleen  Behavior/Cognition: Lethargic/Drowsy; Requires cueing Self-Feeding Abilities: Needs set-up for self-feeding Baseline vocal quality/speech: Normal Volitional Cough: Able to elicit Volitional Swallow: Able to elicit Exam Limitations: Colleen limitations Goal Planning: Prognosis for improved oropharyngeal function: Good Barriers to Reach Goals: Cognitive deficits Colleen data recorded Patient/Family Stated Goal: none stated Consulted and agree with results and recommendations: Patient; Physician Pain: Pain Assessment Pain Assessment: Faces Faces Pain Scale: 0 Facial Expression: 0 Body Movements: 0 Muscle Tension: 0  Compliance with ventilator (intubated pts.):  N/A Vocalization (extubated pts.): N/A CPOT Total: 0 Pain Location: generalized Pain Descriptors / Indicators: Discomfort Pain Intervention(s): Monitored during session End of Session: Start Time:SLP Start Time (ACUTE ONLY): 1501 Stop Time: SLP Stop Time (ACUTE ONLY): 1521 Time Calculation:SLP Time Calculation (min) (ACUTE ONLY): 20 min Charges: SLP Evaluations $ SLP Speech Visit: 1 Visit SLP Evaluations $MBS Swallow: 1 Procedure $Swallowing Treatment: 1 Procedure $Speech Treatment for Individual: 1 Procedure SLP visit diagnosis: SLP Visit Diagnosis: Dysphagia, oropharyngeal phase (R13.12) Past Medical History: Past Medical History: Diagnosis Date  Allergy   Anxiety   Arrhythmia   does not see cardiologist, sees pcp, Dr. 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   Heartburn   Herpes   on medication for outbreaks only  Hypertension   Seizures (HCC)   MEDICATION INDUCED  Urinary tract infection   hx of Past Surgical History: Past Surgical History: Procedure Laterality Date  CESAREAN SECTION    x 2  COLONOSCOPY    CRANIOTOMY Right 03/11/2024  Procedure: CRANIOTOMY HEMATOMA EVACUATION SUBDURAL;  Surgeon: Darnella Dorn SAUNDERS, MD;  Location: [facility] OR;  Service: Neurosurgery;  Laterality: Right;  ELBOW SURGERY    right  KNEE ARTHROSCOPY    right  PILONIDAL CYST EXCISION    spinal cystectomy    where pilonidal cyst was  THYROIDECTOMY  04/07/2012  Procedure: THYROIDECTOMY;  Surgeon: Sherlean JINNY Laughter, MD;  Location: [facility] OR;  Service: General;  Laterality: N/A;   Damien Blumenthal, M.A., CCC-SLP Speech Language Pathology,  Acute Rehabilitation Services Secure Chat preferred [phone] 03/21/2024, 4:26 PM  DG CHEST PORT 1 VIEW Result Date: 03/21/2024 EXAM: 1 VIEW XRAY OF THE CHEST 03/21/2024 08:36:00 AM COMPARISON: 03/20/2024 CLINICAL HISTORY: Dyspnea FINDINGS: LUNGS AND PLEURA: Low lung volumes. Small bilateral pleural effusions. Mild pulmonary edema. Bibasilar opacities. HEART AND MEDIASTINUM: Colleen acute abnormality of the cardiac and mediastinal silhouettes. Surgical clips noted at thoracic inlet. BONES AND SOFT TISSUES: Colleen acute osseous abnormality. Enteric tube in place, coursing through the chest to the abdomen beyond the field-of-view. Right PICC line removed. IMPRESSION: 1. Small bilateral pleural effusions and mild pulmonary edema. 2. Bibasilar opacities, increased on the right compared to prior. 3. Low lung volumes. Electronically signed by: Donnice Mania MD 03/21/2024 10:44 AM EDT RP Workstation: [id number]   CT HEAD WO CONTRAST ( ) Result Date: 03/20/2024 CLINICAL DATA:  Provided history: Subdural hematoma, follow-up examination. EXAM: CT HEAD WITHOUT CONTRAST TECHNIQUE: Contiguous axial images were obtained from the base of the skull through the vertex without intravenous contrast. RADIATION DOSE REDUCTION: This exam was performed according to the departmental dose-optimization program which includes automated exposure control, adjustment of the mA and/or kV according to patient size and/or use of iterative reconstruction technique. COMPARISON:  Prior head CT examinations 03/19/2024 and earlier. FINDINGS: Brain: A previously demonstrated left frontal approach ventricular catheter has been removed. Unchanged size and configuration of the ventricular system. A subdural hematoma along the right cerebral convexity has not significantly changed in size, again measuring up to 10 mm in thickness. Unchanged mass effect with 6 mm leftward midline shift. Trace subdural hemorrhage along the falx, also unchanged. Colleen demarcated  cortical infarct. Colleen evidence of an intracranial mass. Vascular: Colleen hyperdense vessel.  Atherosclerotic calcifications. Skull: Right-sided cranioplasty now with adjacent scalp staples. Sinuses/Orbits: Colleen new finding. Other: Partially imaged nasoenteric tube. IMPRESSION: 1. A previously demonstrated left frontal approach ventricular catheter has been removed. Stable size and configuration of the ventricular system. 2. Unchanged size of the subdural hematoma along the right cerebral convexity with 6 mm leftward midline shift. 3. Unchanged trace subdural hemorrhage along the falx. 4. Colleen evidence of an interval acute intracranial abnormality. Electronically Signed   By: Rockey Childs D.O.   On: 03/20/2024 15:37   DG Chest Port 1 View Result Date: 03/20/2024 CLINICAL DATA:  73 year old female status post fall last month with right side subdural hematoma. Shortness of breath. EXAM: PORTABLE CHEST 1 VIEW COMPARISON:  Chest radiographs 11/25/2020 and earlier. CT cervical spine 03/09/2024. FINDINGS: Portable AP semi upright view at 0653 hours. Enteric feeding tube courses through the central mediastinum to the left abdomen and tip is not included. Right upper extremity PICC line is in place and tip is at the lower SVC level about 1 vertebral body below the carina. Lower lung volumes. Stable cardiac size and mediastinal contours. Visualized tracheal air column is within normal limits. Asymmetric Patchy and confluent left lung base opacity partially obscuring the left diaphragm. Colleen pneumothorax or pulmonary edema. Colleen pleural effusion is evident. Colleen other confluent lung opacity. Thyroidectomy surgical clips. Paucity of visible bowel gas. Colleen acute osseous abnormality identified. IMPRESSION: 1. Confluent left lung base opacity, nonspecific but more resembles pneumonia than atelectasis. Aspiration not excluded. 2. Enteric feeding tube and right upper extremity PICC line in place. Electronically Signed   By: VEAR Hurst M.D.   On:  03/20/2024 07:09   CT HEAD WO CONTRAST ( ) Result Date: 03/19/2024 CLINICAL DATA:  Mental status change, unknown cause lethargic this morning, started on heparin  drip yesterday for DVT. EXAM: CT HEAD WITHOUT CONTRAST TECHNIQUE: Contiguous axial images were obtained from the base of the skull through the vertex without intravenous contrast. RADIATION DOSE REDUCTION: This exam was performed according to the departmental dose-optimization program which includes automated exposure control, adjustment of the mA and/or kV according to patient size and/or use of iterative reconstruction technique. COMPARISON:  Head CT 03/16/2024 FINDINGS: Brain: A left frontal approach ventriculostomy catheter terminates along the septum pellucidum at the level of the bodies of the lateral ventricles, unchanged. The ventricles are unchanged in size with the right being chronically larger than the left, likely developmental. A subdural hematoma over the right cerebral convexity is unchanged in size and measures up to 10 mm in thickness. Associated mass effect including 6 mm of leftward midline shift is unchanged. Trace subdural hemorrhage along the falx is unchanged. Colleen new intracranial hemorrhage or acute infarct is identified. Partial effacement of the basilar cisterns is unchanged. There is persistent minimal pneumocephalus. Vascular: Colleen hyperdense vessel. Skull: Right-sided craniotomy with persistent mild overlying soft tissue swelling and with skin staples remaining in place. Sinuses/Orbits: Unremarkable orbits. The visualized paranasal sinuses and mastoid air cells are well aerated. Other: None. IMPRESSION: 1. Unchanged right-sided subdural hematoma with 6 mm of leftward midline shift. 2. Unchanged ventriculostomy catheter and ventricular size. 3. Colleen evidence of a new intracranial abnormality. Electronically Signed   By: Dasie Hamburg M.D.   On: 03/19/2024 08:45   VAS US  UPPER EXTREMITY VENOUS DUPLEX Result Date: 03/18/2024 UPPER  VENOUS STUDY  Patient Name:  [facility]  Date of Exam:   03/18/2024 Medical Rec #:  [id number]             Accession #:    [id number] Date of Birth: [birth year]              Patient Gender: F Patient Age:   25 years Exam Location:  [facility] Procedure:      VAS US  UPPER EXTREMITY VENOUS DUPLEX Referring Phys: SAMMI PAWAR --------------------------------------------------------------------------------  Indications: Swelling Risk Factors: Subdural hematoma. Catatonia. Limitations: Poor ultrasound/tissue interface, Patient condition/snoring, bandages and line. Comparison Study: Colleen prior study Performing Technologist: Alberta Lis RVS  Examination Guidelines: A complete evaluation includes B-mode imaging, spectral Doppler, color Doppler, and power Doppler as needed of all accessible portions of each vessel. Bilateral testing is considered an integral part of a complete examination. Limited examinations for reoccurring indications may be performed as noted.  Right Findings: +----------+------------+---------+-----------+----------+---------------------+ RIGHT     CompressiblePhasicitySpontaneousProperties       Summary        +----------+------------+---------+-----------+----------+---------------------+ IJV           Full       Yes       Yes                                    +----------+------------+---------+-----------+----------+---------------------+ Subclavian  Partial      Yes       Yes                      Acute         +----------+------------+---------+-----------+----------+---------------------+ Axillary    Partial                                         Acute         +----------+------------+---------+-----------+----------+---------------------+ Brachial      Full       Yes       Yes                                    +----------+------------+---------+-----------+----------+---------------------+ Radial        Full                                                         +----------+------------+---------+-----------+----------+---------------------+ Ulnar         Full                                                        +----------+------------+---------+-----------+----------+---------------------+ Cephalic      None                                      Acute AC and  throughout upper arm                                                           to shoulder      +----------+------------+---------+-----------+----------+---------------------+ Basilic       None                                   Acute, above PICC,                                                           extending to                                                               confluence       +----------+------------+---------+-----------+----------+---------------------+  Left Findings: +----------+------------+---------+-----------+----------+---------------------+ LEFT      CompressiblePhasicitySpontaneousProperties       Summary        +----------+------------+---------+-----------+----------+---------------------+ IJV           Full       Yes       Yes                                    +----------+------------+---------+-----------+----------+---------------------+ Subclavian    Full       Yes       Yes                                    +----------+------------+---------+-----------+----------+---------------------+ Axillary      Full       Yes       Yes                                    +----------+------------+---------+-----------+----------+---------------------+ Brachial      Full                                                        +----------+------------+---------+-----------+----------+---------------------+ Radial        Full                                                         +----------+------------+---------+-----------+----------+---------------------+ Ulnar  patent by color and                                                             Doppler        +----------+------------+---------+-----------+----------+---------------------+ Cephalic      None                                    Age Indeterminate                                                             upper arm       +----------+------------+---------+-----------+----------+---------------------+ Basilic       None                                    Age Indeterminate   +----------+------------+---------+-----------+----------+---------------------+  Summary:  Right: Findings consistent with partially occlusive acute deep vein thrombosis involving the right subclavian vein and right axillary vein. Findings consistent with acute superficial vein thrombosis involving the right basilic vein and right cephalic vein.  Left: Colleen evidence of deep vein thrombosis in the upper extremity. Findings consistent with age indeterminate superficial vein thrombosis involving the left cephalic vein and left basilic vein.  *See table(s) above for measurements and observations.  Diagnosing physician: Gaile New MD Electronically signed by Gaile New MD on 03/18/2024 at 8:27:02 PM.    Final    US  EKG SITE RITE Result Date: 03/16/2024 If Site Rite image not attached, placement could not be confirmed due to current cardiac rhythm.  DG Abd Portable 1V Result Date: 03/16/2024 CLINICAL DATA:  NG tube placement. EXAM: PORTABLE ABDOMEN - 1 VIEW COMPARISON:  None Available. FINDINGS: NG tube tip is in the stomach with proximal side port positioned at the level of the GE junction. Visualized upper abdomen demonstrates nonspecific bowel gas pattern. Telemetry leads overlie the chest. IMPRESSION: NG tube tip is in the stomach with proximal side port positioned at the  level of the GE junction. Advancing the tube approximately 5 cm should place the side port below the GE junction. Repeat x-ray recommended after repositioning. Electronically Signed   By: Camellia Candle M.D.   On: 03/16/2024 07:23   CT HEAD WO CONTRAST ( ) Result Date: 03/16/2024 EXAM: CT HEAD WITHOUT CONTRAST 03/16/2024 05:57:38 AM TECHNIQUE: CT of the head was performed without the administration of intravenous contrast. Automated exposure control, iterative reconstruction, and/or weight based adjustment of the mA/kV was utilized to reduce the radiation dose to as low as reasonably achievable. COMPARISON: CT of the head dated 03/15/2024. CLINICAL HISTORY: Head trauma, minor (Age >= 65y). FINDINGS: BRAIN AND VENTRICLES: A left frontal approach ventriculostomy drain has been placed and its tip is along the midline within the left lateral ventricle. A right-sided subdural hematoma is again demonstrated, measuring approximately 8 mm in maximal thickness, similar to the prior study. There is persistent shift of the midline structures to the left, by approximately 7 mm. ORBITS: Colleen  acute abnormality. SINUSES: Colleen acute abnormality. SOFT TISSUES AND SKULL: The patient is again noted to be status post right craniotomy. IMPRESSION: 1. Right-sided subdural hematoma, measuring approximately 8 mm in maximal thickness, similar to the prior study. 2. Persistent shift of the midline structures to the left, by approximately 7 mm. 3. Left frontal approach ventriculostomy drain in place with tip along the midline within the left lateral ventricle. 4. Status post right craniotomy. Electronically signed by: Evalene Coho MD 03/16/2024 06:33 AM EDT RP Workstation: [id number]   CT HEAD WO CONTRAST ( ) Addendum Date: 03/15/2024  ADDENDUM: 1. Findings relayed to charge nurse at 7:55PM on 03/15/24. Per charge nurse, Dr. Darnella is aware of findings and placing an EVD. ---------------------------------------------------- Electronically  signed by: Donnice Mania MD 03/15/2024 08:03 PM EDT RP Workstation: [id number]   Result Date: 03/15/2024 EXAM: CT HEAD WITHOUT CONTRAST 03/15/2024 06:45:00 PM TECHNIQUE: CT of the head was performed without the administration of intravenous contrast. Automated exposure control, iterative reconstruction, and/or weight based adjustment of the mA/kV was utilized to reduce the radiation dose to as low as reasonably achievable. COMPARISON: CT head 03/14/2024 CLINICAL HISTORY: Mental status change, persistent or worsening. FINDINGS: BRAIN AND VENTRICLES: Redemonstrated right-sided subdural hematoma which measures up to 9 mm in thickness on the current study previously measuring up to 8 mm. Similar degree of mass effect. There is approximately 8 mm leftward midline shift which is similar to prior. Similar size and configuration of the ventricles. There is increased cerebral edema with crowding of the basilar cisterns. There is increased effacement of the fourth ventricle. There is partial effacement of the prepontine cistern. There is slight inferior descent of the cerebellar tonsils which are incompletely visualized. Change in mass effect best seen on series 6 image 30. ORBITS: Colleen acute abnormality. SINUSES: Colleen acute abnormality. SOFT TISSUES AND SKULL: Postsurgical changes of right-sided craniotomy. Similar appearance of skin staples and small volume soft tissue gas. There are few locules of pneumocephalus over the right frontal lobe which are similar to prior. Haematoma over the right forehead is slightly decreased. IMPRESSION: 1. Redemonstrated right-sided subdural hematoma, now measuring up to 9 mm (previously 8 mm), with similar degree of mass effect and approximately 8 mm leftward midline shift. 2. Increased cerebral edema with crowding of the basilar cisterns, increased effacement of the fourth ventricle, partial effacement of the prepontine cistern, and slight inferior descent of the cerebellar tonsils.  Electronically signed by: Donnice Mania MD 03/15/2024 07:23 PM EDT RP Workstation: [id number]   EEG adult Result Date: 03/14/2024 Shelton Arlin KIDD, MD     03/14/2024 12:06 PM Patient Name: Emberley Kral MRN: [id number] Epilepsy Attending: Arlin KIDD Shelton Referring Physician/Provider: Theodoro Lakes, MD Date: 03/14/2024 Duration: 24.20 mins Patient history: 73 y/o F w/ hx HTN who presented after GLF, found to have right convexity SDH and possible CCJ EDH. EEG to evaluate for seizure Level of alertness: Awake, asleep AEDs during EEG study: LEV, LTG Technical aspects: This EEG study was done with scalp electrodes positioned according to the 10-20 International system of electrode placement. Electrical activity was reviewed with band pass filter of 1-70Hz , sensitivity of 7 uV/mm, display speed of 63mm/sec with a 60Hz  notched filter applied as appropriate. EEG data were recorded continuously and digitally stored.  Video monitoring was available and reviewed as appropriate. Description: The posterior dominant rhythm consists of 8Hz  activity of moderate voltage (25-35 uV) seen predominantly in posterior head regions, symmetric and reactive to eye opening and eye closing. Sleep was characterized  by vertex waves, sleep spindles (12 to 14 Hz), maximal frontocentral region.  EEG showed continuous generalized and lateralized right hemisphere 3 to 6 Hz theta-delta slowing. There is also overriding 13-15hz  fast activity in right frontal region consistent with breach artifact. Hyperventilation and photic stimulation were not performed.   ABNORMALITY - Breach artifact, right frontal region - Continuous slow, generalized and lateralized right hemisphere IMPRESSION: This study was suggestive of cortical dysfunction arising from right frontal region consistent with underlying craniotomy. Additionally there was cortical dysfunction in right hemisphere, likely secondary to underlying structural abnormality. Lastly there was mild to  moderate diffuse encephalopathy. Colleen seizures or epileptiform discharges were seen throughout the recording. Priyanka MALVA Krebs   CT HEAD WO CONTRAST ( ) Result Date: 03/14/2024 EXAM: CT HEAD WITHOUT CONTRAST 03/14/2024 04:52:27 AM TECHNIQUE: CT of the head was performed without the administration of intravenous contrast. Automated exposure control, iterative reconstruction, and/or weight based adjustment of the mA/kV was utilized to reduce the radiation dose to as low as reasonably achievable. COMPARISON: CT of the head dated 03/13/2024. CLINICAL HISTORY: Head trauma, moderate-severe. F/u head ct- rad recommended FINDINGS: BRAIN AND VENTRICLES: Status post craniotomy for evacuation of right-sided subdural hematoma. A subdural drain remains in place. There is residual subdural hemorrhage and intracranial air, which appears similar to the prior exam. There is persistent shift of the midline structures to the left by about 8 mm, as before. ORBITS: Colleen acute abnormality. SINUSES: Colleen acute abnormality. SOFT TISSUES AND SKULL: Colleen acute soft tissue abnormality. Colleen skull fracture. IMPRESSION: 1. Status post right-sided craniotomy for evacuation of subdural hematoma with residual subdural hemorrhage, intracranial air, and subdural drain in place, similar to prior exam. 2. Persistent leftward midline shift of approximately 8 mm, as before. Electronically signed by: Evalene Coho MD 03/14/2024 05:12 AM EDT RP Workstation: [id number]   CT HEAD WO CONTRAST ( ) Result Date: 03/13/2024 CLINICAL DATA:  Provided history: Neuro deficit, acute, stroke suspected. EXAM: CT HEAD WITHOUT CONTRAST TECHNIQUE: Contiguous axial images were obtained from the base of the skull through the vertex without intravenous contrast. RADIATION DOSE REDUCTION: This exam was performed according to the departmental dose-optimization program which includes automated exposure control, adjustment of the mA and/or kV according to patient size and/or  use of iterative reconstruction technique. COMPARISON:  Head CT 03/12/2024. FINDINGS: Brain: Interval evolution of postoperative changes from prior right-sided craniotomy and right subdural hematoma evacuation a subdural drain remains in place. The subdural hematoma along the right cerebral convexity has increased in size since 03/12/2024, now measuring up to 7 mm in thickness (previously 5 mm). Progressive mass effect, now with 8 mm leftward midline shift (previously 6 mm). Persistent although decreased small volume pneumocephalus deep to the cranioplasty. Only trace residual subdural hemorrhage along the falx. Colleen demarcated cortical infarct. Colleen evidence of an intracranial mass. Vascular: Colleen hyperdense vessel.  Atherosclerotic calcifications. Skull: Right-sided cranioplasty. Sinuses/Orbits: Colleen mass or acute finding within the imaged orbits. Colleen significant phlegm a tori paranasal sinus disease. 5 mm left frontal sinus osteoma. Other: Scalp staples, subcutaneous gas and soft tissue swelling overlying the cranioplasty. Right periorbital hematoma. Impression #1 will be called to the ordering clinician or representative by the Radiologist Assistant, and communication documented in the PACS or [employer]. IMPRESSION: 1. Interval increase in size of a subdural hematoma along the right cerebral convexity, now measuring up to 7 mm in thickness (previously 5 mm). Progressive mass effect, now with 8 mm leftward midline shift (previously 6 mm). 2. Only trace residual  subdural hemorrhage along the falx. Electronically Signed   By: Rockey Childs D.O.   On: 03/13/2024 18:06   CT HEAD WO CONTRAST Result Date: 03/12/2024 CLINICAL DATA:  73 year old female status post fall with right side subdural hematoma, postoperative day 0 or day 1 status post craniotomy and hematoma evacuation. EXAM: CT HEAD WITHOUT CONTRAST TECHNIQUE: Contiguous axial images were obtained from the base of the skull through the vertex without intravenous  contrast. RADIATION DOSE REDUCTION: This exam was performed according to the departmental dose-optimization program which includes automated exposure control, adjustment of the mA and/or kV according to patient size and/or use of iterative reconstruction technique. COMPARISON:  1748 hours yesterday. FINDINGS: Brain: Interval postoperative changes with right subdural drain in place. Small volume pneumocephalus. Regressed mixed density right side subdural hematoma. Mostly hyperdense residual now is up to 4-5 mm thickness on coronal images (versus between 7 and 10 mm on coronal images yesterday). Trace para falcine hyperdense blood also noted, not significantly changed. Decreased mass effect on the right hemisphere. Leftward midline shift now 6 mm, versus up to 11 mm yesterday. Colleen ventriculomegaly. Basilar cistern patency has improved, especially the suprasellar cistern on sagittal image 31. Colleen new intracranial hemorrhage. Stable gray-white matter differentiation throughout the brain. Colleen cortically based acute infarct identified. Vascular: Colleen suspicious intracranial vascular hyperdensity. Skull: New right side craniotomy. Sinuses/Orbits: Visualized paranasal sinuses and mastoids are stable and well aerated. Other: New postoperative changes to the right scalp. Superficial and percutaneous drains in place. Overlying skin staples. Superimposed right forehead and supraorbital scalp hematoma. Orbits soft tissues otherwise appear negative. IMPRESSION: 1. Postoperative decreased right side subdural hematoma, residual is 4-5 mm thickness on coronal images. Decreased intracranial mass effect, leftward midline shift now up to 6 mm, and substantially improved basilar cistern patency. 2. Colleen new intracranial abnormality. Electronically Signed   By: VEAR Hurst M.D.   On: 03/12/2024 05:39   CT HEAD WO CONTRAST ( ) Result Date: 03/11/2024 EXAM: CT HEAD WITHOUT CONTRAST 03/11/2024 05:55:09 PM TECHNIQUE: CT of the head was performed  without the administration of intravenous contrast. Automated exposure control, iterative reconstruction, and/or weight based adjustment of the mA/kV was utilized to reduce the radiation dose to as low as reasonably achievable. COMPARISON: CT of the head without contrast 03/10/2024. CLINICAL HISTORY: Head trauma, abnormal mental status (Age 51-64y); Fall. Pt fell in bathroom, in hospital room. Possible seizure. FINDINGS: BRAIN AND VENTRICLES: The mixed density subdural collection over the right convexity is stable in size, measuring approximately 6 mm. Mass effect and midline shift are stable to slightly increased, now measuring 10 mm. Minimal blood is again noted along the anterior cerebral falx. Colleen new hemorrhage is present. ORBITS: Colleen acute abnormality. SINUSES: Colleen acute abnormality. SOFT TISSUES AND SKULL: Colleen acute soft tissue abnormality. Colleen skull fracture. IMPRESSION: 1. Stable mixed density subdural collection over the right convexity measuring approximately 6 mm. 2. Stable to slightly increased mass effect and midline shift, now measuring 10 mm. 3. Minimal blood along the anterior cerebral falx. Colleen new hemorrhage. Electronically signed by: Lonni Necessary MD 03/11/2024 06:13 PM EDT RP Workstation: [id number]   CT HEAD WO CONTRAST ( ) Result Date: 03/10/2024 EXAM: CT HEAD WITHOUT CONTRAST 03/10/2024 01:49:43 PM TECHNIQUE: CT of the head was performed without the administration of intravenous contrast. Automated exposure control, iterative reconstruction, and/or weight based adjustment of the mA/kV was utilized to reduce the radiation dose to as low as reasonably achievable. COMPARISON: CT head without contrast 03/09/2024. CLINICAL HISTORY: Subdural hematoma. Chief  complaints; Fall. FINDINGS: BRAIN AND VENTRICLES: American convexity subdural hematoma, stable, measuring 5 mm maximally on coronal images. Partial effacement of the sulci was again noted. Midline shift measures up to 9 mm, stable. Minimal  blood along the falx, stable. Colleen new hemorrhage is present. ORBITS: Colleen acute abnormality. SINUSES: Colleen acute abnormality. SOFT TISSUES AND SKULL: Colleen underlying fracture or foreign body is present. IMPRESSION: 1. Stable right convexity subdural hematoma measuring 5 mm maximally on coronal images, with associated partial effacement of the sulci and stable midline shift up to 9 mm. 2. Colleen new hemorrhage. Electronically signed by: Lonni Necessary MD 03/10/2024 02:02 PM EDT RP Workstation: [id number]   MR Cervical Spine Wo Contrast Result Date: 03/09/2024 CLINICAL DATA:  Initial evaluation for acute neck trauma. EXAM: MRI CERVICAL SPINE WITHOUT CONTRAST TECHNIQUE: Multiplanar, multisequence MR imaging of the cervical spine was performed. Colleen intravenous contrast was administered. COMPARISON:  Prior CT from earlier the same day. FINDINGS: Alignment: Straightening of the normal cervical lordosis. Colleen significant listhesis. Vertebrae: Vertebral body height maintained without acute or chronic fracture. Bone marrow signal intensity within normal limits. Colleen discrete or worrisome osseous lesions. Colleen abnormal marrow edema. Cord: Normal signal and morphology. Colleen evidence for traumatic cord injury. Hemorrhage seen ventral to the spinal cord again seen, corresponding with abnormality on prior CT. This appears to be subdural/intradural in location, and likely tracks from the intracranial compartment given findings on prior head CT. This measures up to 4 mm in maximal AP thickness at the level of C7-T1 (series 6, image 8). Colleen significant spinal stenosis. Similarly, small volume hemorrhage measuring up to 4 mm at the cervicomedullary junction also likely tracking from the cranial vault (series 5, image 8). Colleen other signal changes to suggest ligamentous injury. Posterior Fossa, vertebral arteries, paraspinal tissues: Remainder the visualized brain and posterior fossa demonstrate Colleen other acute finding. Changes of chronic  microvascular ischemic disease noted within the visualized pons. Paraspinous soft tissues demonstrate Colleen other acute finding. Normal flow voids seen within the vertebral arteries bilaterally. Disc levels: C2-C3: Negative interspace.  Colleen canal or foraminal stenosis. C3-C4: Negative interspace. Mild left-sided facet degeneration. Colleen stenosis. C4-C5: Degenerative intervertebral disc space narrowing with diffuse disc osteophyte complex. Colleen spinal stenosis. Moderate left worse than right C5 foraminal narrowing. C5-C6: Degenerate intervertebral disc space narrowing. Left paracentral disc osteophyte complex flattens and indents the left ventral thecal sac (series 9, image 29). Mild cord flattening without cord signal changes. Colleen significant spinal stenosis. Foramina remain patent. C6-C7: Degenerative disc space narrowing with circumferential disc osteophyte complex. Colleen spinal stenosis. Foramina remain patent. C7-T1: Negative interspace. Moderate left with mild right facet hypertrophy. Colleen spinal stenosis. Foramina remain patent. IMPRESSION: 1. Small volume hemorrhage ventral to the spinal cord, corresponding with abnormality on prior CT. This appears to be subdural/intradural in location by MRI, and likely tracks from the intracranial compartment given findings on prior head CT. Similarly, small volume hemorrhage at the cervicomedullary junction also likely emanates from the intracranial compartment. Colleen significant associated spinal stenosis. Colleen evidence for traumatic cord or ligamentous injury. 2. Colleen other evidence for acute traumatic injury within the cervical spine. 3. Multilevel cervical spondylosis without significant spinal stenosis. Moderate left worse than right C5 foraminal narrowing related to disc osteophyte and facet disease. Electronically Signed   By: Morene Hoard M.D.   On: 03/09/2024 23:13   CT Head Wo Contrast Addendum Date: 03/09/2024 ADDENDUM REPORT: 03/09/2024 17:29 ADDENDUM: CT head  impressions #1 and #2 and CT cervical spine impression #  1 called by telephone at the time of interpretation on 03/09/2024 at 5:11 pm to provider MELANIE BELFI , who verbally acknowledged these results. The value of cervical spine MRI to help exclude acute ligamentous injury was also discussed at this site. Electronically Signed   By: Rockey Childs D.O.   On: 03/09/2024 17:29   Result Date: 03/09/2024 CLINICAL DATA:  Provided history: Head trauma, minor. Facial trauma, blunt. Neck trauma. EXAM: CT HEAD WITHOUT CONTRAST CT MAXILLOFACIAL WITHOUT CONTRAST CT CERVICAL SPINE WITHOUT CONTRAST TECHNIQUE: Multidetector CT imaging of the head, cervical spine, and maxillofacial structures were performed using the standard protocol without intravenous contrast. Multiplanar CT image reconstructions of the cervical spine and maxillofacial structures were also generated. RADIATION DOSE REDUCTION: This exam was performed according to the departmental dose-optimization program which includes automated exposure control, adjustment of the mA and/or kV according to patient size and/or use of iterative reconstruction technique. COMPARISON:  Brain MRI 10/01/2019. Head CT 09/12/2018. FINDINGS: CT HEAD FINDINGS Brain: Colleen age-advanced or lobar predominant cerebral atrophy. Acute subdural hemorrhage along the right cerebral convexity, measuring up to 8 mm in thickness (for instance as seen on series 4, image 50). Resultant mass effect with 8 mm leftward midline shift. Acute subdural hemorrhage is also present along the right aspect of the falx, measuring up to 5 mm in thickness (for instance as seen on series 4, image 52). Mild asymmetric prominence of the right lateral ventricle, unchanged, chronic and possibly developmental. Colleen demarcated cortical infarct. Colleen evidence of an intracranial mass. Vascular: Colleen hyperdense vessel.  Atherosclerotic calcifications. Skull: Colleen calvarial fracture or aggressive osseous lesion. Traumatic Brain Injury  Risk Stratification Skull Fracture: Colleen - Low/mBIG 1 Subdural Hematoma (SDH): Yes Subarachnoid Hemorrhage [facility]): Colleen Epidural Hematoma (EDH): Colleen - Low/mBIG 1 Cerebral contusion, intra-axial, intraparenchymal Hemorrhage (IPH): Colleen Intraventricular Hemorrhage (IVH): Colleen - Low/mBIG 1 Midline Shift > 1mm or Edema/effacement of sulci/vents: 8 mm leftward midline shift. ---------------------------------------------------- CT MAXILLOFACIAL FINDINGS Osseous: Colleen acute maxillofacial fracture is identified. Orbits: Right periorbital hematoma. Colleen acute finding within the orbits. Sinuses: Colleen significant inflammatory paranasal sinus disease. Small left frontal sinus osteoma. Soft tissues: Right anterior scalp/forehead and periorbital hematoma. CT CERVICAL SPINE FINDINGS Alignment: Nonspecific straightening of the expected cervical lordosis. 2 mm grade 1 anterolisthesis at C7-T1, T1-T2 and T2-T3. Skull base and vertebrae: The basion-dental and atlanto-dental intervals are maintained.Colleen evidence of acute fracture to the cervical spine. Soft tissues and spinal canal: Hyperdensity at the dorsal aspect of the craniocervical junction, measuring 4 mm in thickness. Hyperdensity within the ventral aspect of the cervical spinal canal, measuring up to 4 mm in thickness. This is consistent with epidural hematoma. Colleen prevertebral soft tissue swelling. Disc levels: Cervical spondylosis with multilevel disc space narrowing, disc bulges/central disc protrusions, posterior disc osteophyte complexes and uncovertebral hypertrophy. Colleen appreciable high-grade degenerative spinal canal stenosis. Multilevel bony neural foraminal narrowing. Degenerative changes also present at the C1-C2 articulation. Upper chest: Colleen consolidation within the imaged lung apices. Colleen visible pneumothorax. Other: Prior thyroidectomy. Attempts are being made to reach the ordering provider at this time. IMPRESSION: CT head: 1. Acute subdural hemorrhage along the right cerebral  convexity, measuring up to 8 mm in thickness. Resultant mass effect with 8 mm leftward midline shift. 2. Acute subdural hemorrhage also present along the right aspect of the falx, measuring up to 5 mm in thickness. CT maxillofacial: 1. Colleen evidence of acute maxillofacial fracture. 2. Right anterior scalp/forehead and periorbital hematoma. CT cervical spine: 1. Epidural hematoma at the  craniocervical junction and within the cervical spinal canal (measuring up to 4 mm in thickness). A cervical spine MRI is recommended to better assess the extent of this hemorrhage and to exclude spinal cord mass effect. 2. Colleen evidence of an acute cervical spine fracture. 3. Nonspecific straightening of the expected cervical lordosis. 4. Grade 1 anterolisthesis at C7-T1, T1-T2 and T2-T3. 5. Cervical spondylosis as described. Electronically Signed: By: Rockey Childs D.O. On: 03/09/2024 17:08   CT Cervical Spine Wo Contrast Addendum Date: 03/09/2024 ADDENDUM REPORT: 03/09/2024 17:29 ADDENDUM: CT head impressions #1 and #2 and CT cervical spine impression #1 called by telephone at the time of interpretation on 03/09/2024 at 5:11 pm to provider MELANIE BELFI , who verbally acknowledged these results. The value of cervical spine MRI to help exclude acute ligamentous injury was also discussed at this site. Electronically Signed   By: Rockey Childs D.O.   On: 03/09/2024 17:29   Result Date: 03/09/2024 CLINICAL DATA:  Provided history: Head trauma, minor. Facial trauma, blunt. Neck trauma. EXAM: CT HEAD WITHOUT CONTRAST CT MAXILLOFACIAL WITHOUT CONTRAST CT CERVICAL SPINE WITHOUT CONTRAST TECHNIQUE: Multidetector CT imaging of the head, cervical spine, and maxillofacial structures were performed using the standard protocol without intravenous contrast. Multiplanar CT image reconstructions of the cervical spine and maxillofacial structures were also generated. RADIATION DOSE REDUCTION: This exam was performed according to the departmental  dose-optimization program which includes automated exposure control, adjustment of the mA and/or kV according to patient size and/or use of iterative reconstruction technique. COMPARISON:  Brain MRI 10/01/2019. Head CT 09/12/2018. FINDINGS: CT HEAD FINDINGS Brain: Colleen age-advanced or lobar predominant cerebral atrophy. Acute subdural hemorrhage along the right cerebral convexity, measuring up to 8 mm in thickness (for instance as seen on series 4, image 50). Resultant mass effect with 8 mm leftward midline shift. Acute subdural hemorrhage is also present along the right aspect of the falx, measuring up to 5 mm in thickness (for instance as seen on series 4, image 52). Mild asymmetric prominence of the right lateral ventricle, unchanged, chronic and possibly developmental. Colleen demarcated cortical infarct. Colleen evidence of an intracranial mass. Vascular: Colleen hyperdense vessel.  Atherosclerotic calcifications. Skull: Colleen calvarial fracture or aggressive osseous lesion. Traumatic Brain Injury Risk Stratification Skull Fracture: Colleen - Low/mBIG 1 Subdural Hematoma (SDH): Yes Subarachnoid Hemorrhage [facility]): Colleen Epidural Hematoma (EDH): Colleen - Low/mBIG 1 Cerebral contusion, intra-axial, intraparenchymal Hemorrhage (IPH): Colleen Intraventricular Hemorrhage (IVH): Colleen - Low/mBIG 1 Midline Shift > 1mm or Edema/effacement of sulci/vents: 8 mm leftward midline shift. ---------------------------------------------------- CT MAXILLOFACIAL FINDINGS Osseous: Colleen acute maxillofacial fracture is identified. Orbits: Right periorbital hematoma. Colleen acute finding within the orbits. Sinuses: Colleen significant inflammatory paranasal sinus disease. Small left frontal sinus osteoma. Soft tissues: Right anterior scalp/forehead and periorbital hematoma. CT CERVICAL SPINE FINDINGS Alignment: Nonspecific straightening of the expected cervical lordosis. 2 mm grade 1 anterolisthesis at C7-T1, T1-T2 and T2-T3. Skull base and vertebrae: The basion-dental and  atlanto-dental intervals are maintained.Colleen evidence of acute fracture to the cervical spine. Soft tissues and spinal canal: Hyperdensity at the dorsal aspect of the craniocervical junction, measuring 4 mm in thickness. Hyperdensity within the ventral aspect of the cervical spinal canal, measuring up to 4 mm in thickness. This is consistent with epidural hematoma. Colleen prevertebral soft tissue swelling. Disc levels: Cervical spondylosis with multilevel disc space narrowing, disc bulges/central disc protrusions, posterior disc osteophyte complexes and uncovertebral hypertrophy. Colleen appreciable high-grade degenerative spinal canal stenosis. Multilevel bony neural foraminal narrowing. Degenerative changes also present  at the C1-C2 articulation. Upper chest: Colleen consolidation within the imaged lung apices. Colleen visible pneumothorax. Other: Prior thyroidectomy. Attempts are being made to reach the ordering provider at this time. IMPRESSION: CT head: 1. Acute subdural hemorrhage along the right cerebral convexity, measuring up to 8 mm in thickness. Resultant mass effect with 8 mm leftward midline shift. 2. Acute subdural hemorrhage also present along the right aspect of the falx, measuring up to 5 mm in thickness. CT maxillofacial: 1. Colleen evidence of acute maxillofacial fracture. 2. Right anterior scalp/forehead and periorbital hematoma. CT cervical spine: 1. Epidural hematoma at the craniocervical junction and within the cervical spinal canal (measuring up to 4 mm in thickness). A cervical spine MRI is recommended to better assess the extent of this hemorrhage and to exclude spinal cord mass effect. 2. Colleen evidence of an acute cervical spine fracture. 3. Nonspecific straightening of the expected cervical lordosis. 4. Grade 1 anterolisthesis at C7-T1, T1-T2 and T2-T3. 5. Cervical spondylosis as described. Electronically Signed: By: Rockey Childs D.O. On: 03/09/2024 17:08   CT Maxillofacial WO CM Addendum Date:  03/09/2024 ADDENDUM REPORT: 03/09/2024 17:29 ADDENDUM: CT head impressions #1 and #2 and CT cervical spine impression #1 called by telephone at the time of interpretation on 03/09/2024 at 5:11 pm to provider MELANIE BELFI , who verbally acknowledged these results. The value of cervical spine MRI to help exclude acute ligamentous injury was also discussed at this site. Electronically Signed   By: Rockey Childs D.O.   On: 03/09/2024 17:29   Result Date: 03/09/2024 CLINICAL DATA:  Provided history: Head trauma, minor. Facial trauma, blunt. Neck trauma. EXAM: CT HEAD WITHOUT CONTRAST CT MAXILLOFACIAL WITHOUT CONTRAST CT CERVICAL SPINE WITHOUT CONTRAST TECHNIQUE: Multidetector CT imaging of the head, cervical spine, and maxillofacial structures were performed using the standard protocol without intravenous contrast. Multiplanar CT image reconstructions of the cervical spine and maxillofacial structures were also generated. RADIATION DOSE REDUCTION: This exam was performed according to the departmental dose-optimization program which includes automated exposure control, adjustment of the mA and/or kV according to patient size and/or use of iterative reconstruction technique. COMPARISON:  Brain MRI 10/01/2019. Head CT 09/12/2018. FINDINGS: CT HEAD FINDINGS Brain: Colleen age-advanced or lobar predominant cerebral atrophy. Acute subdural hemorrhage along the right cerebral convexity, measuring up to 8 mm in thickness (for instance as seen on series 4, image 50). Resultant mass effect with 8 mm leftward midline shift. Acute subdural hemorrhage is also present along the right aspect of the falx, measuring up to 5 mm in thickness (for instance as seen on series 4, image 52). Mild asymmetric prominence of the right lateral ventricle, unchanged, chronic and possibly developmental. Colleen demarcated cortical infarct. Colleen evidence of an intracranial mass. Vascular: Colleen hyperdense vessel.  Atherosclerotic calcifications. Skull: Colleen calvarial  fracture or aggressive osseous lesion. Traumatic Brain Injury Risk Stratification Skull Fracture: Colleen - Low/mBIG 1 Subdural Hematoma (SDH): Yes Subarachnoid Hemorrhage [facility]): Colleen Epidural Hematoma (EDH): Colleen - Low/mBIG 1 Cerebral contusion, intra-axial, intraparenchymal Hemorrhage (IPH): Colleen Intraventricular Hemorrhage (IVH): Colleen - Low/mBIG 1 Midline Shift > 1mm or Edema/effacement of sulci/vents: 8 mm leftward midline shift. ---------------------------------------------------- CT MAXILLOFACIAL FINDINGS Osseous: Colleen acute maxillofacial fracture is identified. Orbits: Right periorbital hematoma. Colleen acute finding within the orbits. Sinuses: Colleen significant inflammatory paranasal sinus disease. Small left frontal sinus osteoma. Soft tissues: Right anterior scalp/forehead and periorbital hematoma. CT CERVICAL SPINE FINDINGS Alignment: Nonspecific straightening of the expected cervical lordosis. 2 mm grade 1 anterolisthesis at C7-T1, T1-T2 and T2-T3.  Skull base and vertebrae: The basion-dental and atlanto-dental intervals are maintained.Colleen evidence of acute fracture to the cervical spine. Soft tissues and spinal canal: Hyperdensity at the dorsal aspect of the craniocervical junction, measuring 4 mm in thickness. Hyperdensity within the ventral aspect of the cervical spinal canal, measuring up to 4 mm in thickness. This is consistent with epidural hematoma. Colleen prevertebral soft tissue swelling. Disc levels: Cervical spondylosis with multilevel disc space narrowing, disc bulges/central disc protrusions, posterior disc osteophyte complexes and uncovertebral hypertrophy. Colleen appreciable high-grade degenerative spinal canal stenosis. Multilevel bony neural foraminal narrowing. Degenerative changes also present at the C1-C2 articulation. Upper chest: Colleen consolidation within the imaged lung apices. Colleen visible pneumothorax. Other: Prior thyroidectomy. Attempts are being made to reach the ordering provider at this time. IMPRESSION: CT  head: 1. Acute subdural hemorrhage along the right cerebral convexity, measuring up to 8 mm in thickness. Resultant mass effect with 8 mm leftward midline shift. 2. Acute subdural hemorrhage also present along the right aspect of the falx, measuring up to 5 mm in thickness. CT maxillofacial: 1. Colleen evidence of acute maxillofacial fracture. 2. Right anterior scalp/forehead and periorbital hematoma. CT cervical spine: 1. Epidural hematoma at the craniocervical junction and within the cervical spinal canal (measuring up to 4 mm in thickness). A cervical spine MRI is recommended to better assess the extent of this hemorrhage and to exclude spinal cord mass effect. 2. Colleen evidence of an acute cervical spine fracture. 3. Nonspecific straightening of the expected cervical lordosis. 4. Grade 1 anterolisthesis at C7-T1, T1-T2 and T2-T3. 5. Cervical spondylosis as described. Electronically Signed: By: Rockey Childs D.O. On: 03/09/2024 17:08   DG Finger Thumb Right Result Date: 03/09/2024 CLINICAL DATA:  Thumb injury EXAM: RIGHT THUMB 2+V COMPARISON:  None Available. FINDINGS: Colleen fracture or malalignment. Mild degenerative change at the IP and first [facility] joint. Colleen radiopaque foreign body in the soft tissues IMPRESSION: Colleen acute osseous abnormality. Electronically Signed   By: Luke Bun M.D.   On: 03/09/2024 16:46    Microbiology: Results for orders placed or performed during the hospital encounter of 03/09/24  MRSA Next Gen by PCR, Nasal     Status: None   Collection Time: 03/12/24  2:38 AM   Specimen: Nasal Mucosa; Nasal Swab  Result Value Ref Range Status   MRSA by PCR Next Gen NOT DETECTED NOT DETECTED Final    Comment: (NOTE) The GeneXpert MRSA Assay (FDA approved for NASAL specimens only), is one component of a comprehensive MRSA colonization surveillance program. It is not intended to diagnose MRSA infection nor to guide or monitor treatment for MRSA infections. Test performance is not FDA approved in  patients less than 62 years old. Performed at [facility], [address], [place], [place] [address]     Labs: CBC: Recent Labs  Lab 03/24/24 0220 03/25/24 0557 03/26/24 0635 03/27/24 1118  WBC 6.7 17.0* 14.8* 8.4  NEUTROABS  --  14.7* 12.6* 6.9  HGB 8.7* 9.5* 8.0* 8.0*  HCT 26.1* 28.4* 23.6* 23.5*  MCV 96.7 98.3 95.5 95.5  PLT 297 337 310 345   Basic Metabolic Panel: Recent Labs  Lab 03/24/24 0220 03/25/24 0557 03/26/24 0635 03/27/24 1118 03/28/24 0212  NA 130* 128* 128* 128* 130*  K 4.0 3.9 3.8 3.6 3.7  CL 100 98 95* 94* 97*  CO2 23 20* 24 25 24   GLUCOSE 121* 152* 94 88 99  BUN 19 20 16 17 12   CREATININE 0.69 0.67 0.82  0.76 0.71  CALCIUM  7.9* 8.0* 8.1* 8.2* 8.1*  MG 1.9  --   --   --   --    Liver Function Tests: Recent Labs  Lab 03/25/24 0557  AST 20  ALT 19  ALKPHOS 113  BILITOT <0.2  PROT 5.9*  ALBUMIN 2.6*   CBG: Recent Labs  Lab 03/29/24 0625 03/29/24 0748 03/29/24 1128 03/29/24 1550 03/29/24 2243  GLUCAP 83 81 101* 103* 79    Discharge time spent: greater than 30 minutes.  Signed: Sabas GORMAN Brod, MD [facility] 03/30/2024

## 2024-03-30 NOTE — Progress Notes (Signed)
 Patients capillary blood glucose results not transferring into results review.  Confirmed with nurse tech and glucometer that blood sugar was checked at 1252 and result was  95

## 2024-03-30 NOTE — Progress Notes (Signed)
 Occupational Therapy Treatment Patient Details Name: Colleen [name] MRN: [id number] DOB: 04/23/1951 Today's Date: 03/30/2024   History of present illness Colleen [name] is a 73 yo female presenting to ED 8/29 after a fall with headache, neck pain, and nausea. CT shows R scalp/periorbital hematoma, acute SDH along the R cerebral convexity measuring 8 mm thickness with mass effect and 8 mm leftward midline shift as well as an acute SDH along the R aspect of the falx measuring up to 5 mm in thickness, and an epidural hematoma at the craniocervical junction. MRI shows small dorsal epidural hematoma between C1 vertebrae and occiput. Worsening neurostatus 8/31, taken for R craniotomy for subdural hematoma evacuation. s/p EVD placement 9/4, removed 9/8. New dx LUE DVT 9/7 started on heparin  9/7, MD okayed mobility as of 9/8. PMH includes HTN, asthma, GERD, anxiety/depression, thyroid  garter s/p total thyroidectomy 2013   OT comments  Patient making good gains with OT with supervision to get to EOB. Patient asking to use bathroom but deferred to [facility] before making it to bathroom. Patient performed grooming standing at sink with min assist and asked to attempt to use bathroom again with CGA for transfer and supervision for hygiene. Patient will benefit from continued inpatient follow up therapy, <3 hours/day. Acute OT to continue to follow to address established goals to facilitate DC to next venue of care.        If plan is discharge home, recommend the following:  A lot of help with bathing/dressing/bathroom;Assistance with cooking/housework;Assist for transportation;Help with stairs or ramp for entrance;Direct supervision/assist for financial management;Direct supervision/assist for medications management;A little help with walking and/or transfers   Equipment Recommendations  None recommended by OT    Recommendations for Other Services      Precautions / Restrictions Precautions Precautions:  Fall Recall of Precautions/Restrictions: Impaired Precaution/Restrictions Comments: SBP < 160       Mobility Bed Mobility Overal bed mobility: Needs Assistance Bed Mobility: Supine to Sit     Supine to sit: Supervision     General bed mobility comments: initiation cue    Transfers Overall transfer level: Needs assistance Equipment used: Rolling walker (2 wheels) Transfers: Sit to/from Stand Sit to Stand: Contact guard assist           General transfer comment: CGA for sit to stands and transfers with cues for hand placement and safety     Balance Overall balance assessment: Needs assistance Sitting-balance support: Feet supported, No upper extremity supported Sitting balance-Leahy Scale: Fair     Standing balance support: During functional activity, Bilateral upper extremity supported Standing balance-Leahy Scale: Poor Standing balance comment: reliant on RW                           ADL either performed or assessed with clinical judgement   ADL Overall ADL's : Needs assistance/impaired     Grooming: Wash/dry hands;Wash/dry face;Oral care;Minimal assistance;Standing Grooming Details (indicate cue type and reason): assistance with toothpaste while standing at sink Upper Body Bathing: Contact guard assist;Standing           Lower Body Dressing: Moderate assistance;Sit to/from stand   Toilet Transfer: Contact guard assist;Regular Toilet;BSC/3in1;Rolling walker (2 wheels)   Toileting- Clothing Manipulation and Hygiene: Supervision/safety;Sitting/lateral lean       Functional mobility during ADLs: Contact guard assist;Rolling walker (2 wheels)      Extremity/Trunk Assessment              Vision  Perception     Praxis     Communication Communication Communication: No apparent difficulties Factors Affecting Communication: Difficulty expressing self   Cognition Arousal: Alert Behavior During Therapy: Flat affect Cognition:  No family/caregiver present to determine baseline                               Following commands: Impaired Following commands impaired: Follows one step commands with increased time      Cueing   Cueing Techniques: Verbal cues, Gestural cues, Tactile cues  Exercises      Shoulder Instructions       General Comments complaints of light headiness but VSS    Pertinent Vitals/ Pain       Pain Assessment Pain Assessment: Faces Faces Pain Scale: Hurts a little bit Pain Location: generalized Pain Descriptors / Indicators: Discomfort Pain Intervention(s): Monitored during session  Home Living                                          Prior Functioning/Environment              Frequency  Min 2X/week        Progress Toward Goals  OT Goals(current goals can now be found in the care plan section)  Progress towards OT goals: Progressing toward goals  Acute Rehab OT Goals Patient Stated Goal: to get better OT Goal Formulation: With patient Time For Goal Achievement: 04/09/24 Potential to Achieve Goals: Fair ADL Goals Pt Will Perform Eating: sitting;with supervision Pt Will Perform Grooming: with contact guard assist;standing Pt Will Perform Upper Body Bathing: with min assist;sitting Pt Will Perform Upper Body Dressing: with min assist;sitting Pt Will Perform Lower Body Dressing: sit to/from stand;with contact guard assist Pt Will Transfer to Toilet: with contact guard assist;ambulating;regular height toilet Additional ADL Goal #1: pt will follow 3 step commands during ADL with min cues Additional ADL Goal #2: OT to continue to assess vision  Plan      Co-evaluation                 AM-PAC OT 6 Clicks Daily Activity     Outcome Measure   Help from another person eating meals?: A Little Help from another person taking care of personal grooming?: A Little Help from another person toileting, which includes using toliet,  bedpan, or urinal?: A Lot Help from another person bathing (including washing, rinsing, drying)?: A Lot Help from another person to put on and taking off regular upper body clothing?: A Lot Help from another person to put on and taking off regular lower body clothing?: A Lot 6 Click Score: 14    End of Session Equipment Utilized During Treatment: Gait belt;Rolling walker (2 wheels)  OT Visit Diagnosis: Unsteadiness on feet (R26.81);Muscle weakness (generalized) (M62.81);Other symptoms and signs involving cognitive function;Pain Pain - part of body:  (generalized)   Activity Tolerance Patient tolerated treatment well   Patient Left in chair;with call bell/phone within reach;with chair alarm set   Nurse Communication Mobility status        Time: 9280-9252 OT Time Calculation (min): 28 min  Charges: OT General Charges $OT Visit: 1 Visit OT Treatments $Self Care/Home Management : 23-37 mins  Dick Laine, OTA Acute Rehabilitation Services  Office [phone]   Jeb LITTIE Laine 03/30/2024, 10:58 AM

## 2024-03-30 NOTE — TOC Transition Note (Signed)
 Transition of Care [facility]) - Discharge Note   Patient Details  Name: Colleen [name] MRN: [id number] Date of Birth: 1950-12-31  Transition of Care [facility] CM/SW Contact:  [name], LCSW Phone Number: 03/30/2024, 2:08 PM   Clinical Narrative:   CSW updated by MD that patient is stable for discharge, [facility] has a bed available. CSW sent discharge information and confirmed [facility] is ready to accept. CSW updated daughter, [name], via phone and she is in agreement. Transport arranged with [facility] for next available.  Nurse to call report to [phone], Room 206.    Final next level of care: Skilled Nursing Facility Barriers to Discharge: Barriers Resolved   Patient Goals and CMS Choice Patient states their goals for this hospitalization and ongoing recovery are:: Rehab CMS Medicare.gov Compare Post Acute Care list provided to:: Patient Represenative (must comment) Choice offered to / list presented to : Adult Children  ownership interest in [facility].provided to:: Adult Children    Discharge Placement              Patient chooses bed at: [facility] Patient to be transferred to facility by: [facility] Name of family member notified: [name] Patient and family notified of of transfer: 03/30/24  Discharge Plan and Services Additional resources added to the After Visit Summary for   In-house Referral: Clinical Social Work Discharge Planning Services: CM Consult Post Acute Care Choice: Skilled Nursing Facility                               Social Drivers of Health (SDOH) Interventions SDOH Screenings   Food Insecurity: No Food Insecurity (03/09/2024)  Housing: Low Risk  (03/09/2024)  Transportation Needs: No Transportation Needs (03/09/2024)  Utilities: Not At Risk (03/09/2024)  Alcohol Screen: Low Risk  (10/10/2021)  Depression (PHQ2-9): High Risk (10/09/2021)  Social Connections: Patient Declined (03/09/2024)  Tobacco Use:  Medium Risk (03/09/2024)     Readmission Risk Interventions     No data to display

## 2024-03-30 NOTE — Plan of Care (Signed)

## 2024-04-12 ENCOUNTER — Other Ambulatory Visit (HOSPITAL_COMMUNITY): Payer: Self-pay

## 2024-04-12 DIAGNOSIS — S065XAA Traumatic subdural hemorrhage with loss of consciousness status unknown, initial encounter: Secondary | ICD-10-CM

## 2024-05-14 ENCOUNTER — Encounter: Payer: Self-pay | Admitting: Radiology

## 2024-05-21 ENCOUNTER — Ambulatory Visit
Admission: RE | Admit: 2024-05-21 | Discharge: 2024-05-21 | Disposition: A | Discharge status: Home / Self Care | Source: Ambulatory Visit

## 2024-05-21 DIAGNOSIS — S065XAA Traumatic subdural hemorrhage with loss of consciousness status unknown, initial encounter: Secondary | ICD-10-CM | POA: Insufficient documentation
